# Patient Record
Sex: Female | Born: 1955
Health system: Southern US, Community
[De-identification: ages and names within clinical notes are randomized; demographics above are authoritative.]

## PROBLEM LIST (undated history)

## (undated) DIAGNOSIS — I509 Heart failure, unspecified: Secondary | ICD-10-CM

## (undated) DIAGNOSIS — I441 Atrioventricular block, second degree: Secondary | ICD-10-CM

## (undated) DIAGNOSIS — E785 Hyperlipidemia, unspecified: Secondary | ICD-10-CM

## (undated) DIAGNOSIS — T7840XA Allergy, unspecified, initial encounter: Secondary | ICD-10-CM

## (undated) DIAGNOSIS — I480 Paroxysmal atrial fibrillation: Secondary | ICD-10-CM

## (undated) DIAGNOSIS — C801 Malignant (primary) neoplasm, unspecified: Secondary | ICD-10-CM

## (undated) DIAGNOSIS — I10 Essential (primary) hypertension: Secondary | ICD-10-CM

## (undated) DIAGNOSIS — M1990 Unspecified osteoarthritis, unspecified site: Secondary | ICD-10-CM

## (undated) DIAGNOSIS — Z950 Presence of cardiac pacemaker: Secondary | ICD-10-CM

## (undated) DIAGNOSIS — Z9889 Other specified postprocedural states: Secondary | ICD-10-CM

## (undated) DIAGNOSIS — R112 Nausea with vomiting, unspecified: Secondary | ICD-10-CM

## (undated) DIAGNOSIS — E559 Vitamin D deficiency, unspecified: Secondary | ICD-10-CM

## (undated) HISTORY — DX: Vitamin D deficiency, unspecified: E55.9

## (undated) HISTORY — DX: Allergy, unspecified, initial encounter: T78.40XA

## (undated) HISTORY — DX: Presence of cardiac pacemaker: Z95.0

## (undated) HISTORY — DX: Atrioventricular block, second degree: I44.1

## (undated) HISTORY — DX: Essential (primary) hypertension: I10

## (undated) HISTORY — DX: Paroxysmal atrial fibrillation: I48.0

## (undated) HISTORY — DX: Malignant (primary) neoplasm, unspecified: C80.1

## (undated) HISTORY — DX: Unspecified osteoarthritis, unspecified site: M19.90

## (undated) HISTORY — DX: Hyperlipidemia, unspecified: E78.5

## (undated) HISTORY — PX: COLONOSCOPY: SHX174

## (undated) HISTORY — PX: PLANTAR FASCIECTOMY: SUR600

## (undated) HISTORY — PX: POLYPECTOMY: SHX149

## (undated) HISTORY — DX: Heart failure, unspecified: I50.9

---

## 1978-10-08 HISTORY — PX: BREAST CYST EXCISION: SHX579

## 1997-11-08 ENCOUNTER — Encounter: Payer: Self-pay | Admitting: Gastroenterology

## 1998-11-09 ENCOUNTER — Encounter: Admission: RE | Admit: 1998-11-09 | Discharge: 1998-11-09 | Payer: Self-pay | Admitting: *Deleted

## 1999-09-14 ENCOUNTER — Other Ambulatory Visit: Admission: RE | Admit: 1999-09-14 | Discharge: 1999-09-14 | Payer: Self-pay | Admitting: *Deleted

## 2000-10-24 ENCOUNTER — Other Ambulatory Visit: Admission: RE | Admit: 2000-10-24 | Discharge: 2000-10-24 | Payer: Self-pay | Admitting: Internal Medicine

## 2001-10-24 ENCOUNTER — Other Ambulatory Visit: Admission: RE | Admit: 2001-10-24 | Discharge: 2001-10-24 | Payer: Self-pay | Admitting: Internal Medicine

## 2003-03-24 DIAGNOSIS — K648 Other hemorrhoids: Secondary | ICD-10-CM | POA: Insufficient documentation

## 2003-10-05 ENCOUNTER — Emergency Department (HOSPITAL_COMMUNITY): Admission: EM | Admit: 2003-10-05 | Discharge: 2003-10-05 | Payer: Self-pay | Admitting: Emergency Medicine

## 2003-11-01 ENCOUNTER — Ambulatory Visit (HOSPITAL_COMMUNITY): Admission: RE | Admit: 2003-11-01 | Discharge: 2003-11-01 | Payer: Self-pay | Admitting: Internal Medicine

## 2003-11-01 ENCOUNTER — Other Ambulatory Visit: Admission: RE | Admit: 2003-11-01 | Discharge: 2003-11-01 | Payer: Self-pay | Admitting: Internal Medicine

## 2005-11-15 ENCOUNTER — Other Ambulatory Visit: Admission: RE | Admit: 2005-11-15 | Discharge: 2005-11-15 | Payer: Self-pay | Admitting: Internal Medicine

## 2007-04-23 ENCOUNTER — Other Ambulatory Visit: Admission: RE | Admit: 2007-04-23 | Discharge: 2007-04-23 | Payer: Self-pay | Admitting: Internal Medicine

## 2007-05-20 ENCOUNTER — Ambulatory Visit: Payer: Self-pay | Admitting: Gastroenterology

## 2007-06-27 ENCOUNTER — Ambulatory Visit: Payer: Self-pay | Admitting: Gastroenterology

## 2008-01-10 DIAGNOSIS — K5900 Constipation, unspecified: Secondary | ICD-10-CM | POA: Insufficient documentation

## 2008-01-10 DIAGNOSIS — Z8711 Personal history of peptic ulcer disease: Secondary | ICD-10-CM | POA: Insufficient documentation

## 2008-01-10 DIAGNOSIS — K219 Gastro-esophageal reflux disease without esophagitis: Secondary | ICD-10-CM | POA: Insufficient documentation

## 2008-02-14 ENCOUNTER — Observation Stay (HOSPITAL_COMMUNITY): Admission: EM | Admit: 2008-02-14 | Discharge: 2008-02-15 | Payer: Self-pay | Admitting: Emergency Medicine

## 2008-03-22 ENCOUNTER — Ambulatory Visit: Payer: Self-pay | Admitting: Internal Medicine

## 2008-05-28 ENCOUNTER — Ambulatory Visit: Payer: Self-pay | Admitting: Internal Medicine

## 2008-08-05 ENCOUNTER — Emergency Department (HOSPITAL_COMMUNITY): Admission: EM | Admit: 2008-08-05 | Discharge: 2008-08-05 | Payer: Self-pay | Admitting: Emergency Medicine

## 2008-12-24 ENCOUNTER — Other Ambulatory Visit: Admission: RE | Admit: 2008-12-24 | Discharge: 2008-12-24 | Payer: Self-pay | Admitting: Internal Medicine

## 2009-07-04 ENCOUNTER — Emergency Department (HOSPITAL_COMMUNITY): Admission: EM | Admit: 2009-07-04 | Discharge: 2009-07-04 | Payer: Self-pay | Admitting: Emergency Medicine

## 2009-10-08 HISTORY — PX: WRIST SURGERY: SHX841

## 2009-11-10 ENCOUNTER — Ambulatory Visit (HOSPITAL_COMMUNITY): Admission: RE | Admit: 2009-11-10 | Discharge: 2009-11-10 | Payer: Self-pay | Admitting: Internal Medicine

## 2009-12-05 ENCOUNTER — Other Ambulatory Visit: Admission: RE | Admit: 2009-12-05 | Discharge: 2009-12-05 | Payer: Self-pay | Admitting: Internal Medicine

## 2010-10-28 ENCOUNTER — Encounter: Payer: Self-pay | Admitting: Internal Medicine

## 2010-11-07 ENCOUNTER — Ambulatory Visit
Admission: RE | Admit: 2010-11-07 | Discharge: 2010-11-07 | Payer: Self-pay | Source: Home / Self Care | Attending: Orthopedic Surgery | Admitting: Orthopedic Surgery

## 2010-11-07 LAB — POCT HEMOGLOBIN-HEMACUE: Hemoglobin: 13.4 g/dL (ref 12.0–15.0)

## 2010-11-07 NOTE — Procedures (Signed)
Summary: Gastroenterology endo  Gastroenterology endo   Imported By: Donneta Romberg 01/10/2008 13:45:04  _____________________________________________________________________  External Attachment:    Type:   Image     Comment:   External Document

## 2010-11-07 NOTE — Procedures (Signed)
Summary: Gastroenterology flexsigmoidscopy  Gastroenterology flexsigmoidscopy   Imported By: Donneta Romberg 01/10/2008 13:46:08  _____________________________________________________________________  External Attachment:    Type:   Image     Comment:   External Document

## 2010-11-10 NOTE — Procedures (Signed)
Summary: Gastroenterology colon  Gastroenterology colon   Imported By: Donneta Romberg 01/10/2008 13:44:10  _____________________________________________________________________  External Attachment:    Type:   Image     Comment:   External Document

## 2010-11-27 NOTE — Op Note (Signed)
  NAME:  Alyssa Dudley, Alyssa Dudley NO.:  [id number]  MEDICAL RECORD NO.:  [id number]          PATIENT TYPE:  AMB  LOCATION:  DSC                          FACILITY:  [facility]  PHYSICIAN:  Cindee Salt, M.D.       DATE OF BIRTH:  01-28-1956  DATE OF PROCEDURE:  11/07/2010 DATE OF DISCHARGE:                              OPERATIVE REPORT   PREOPERATIVE DIAGNOSIS:  Mass, right wrist.  POSTOPERATIVE DIAGNOSIS:  Mass, right wrist.  OPERATION:  Excisional biopsy mass, right wrist.  SURGEON:  Cindee Salt, MD  ASSISTANTCarolyne Fiscal, RN  ANESTHESIA:  General with local infiltration.  ANESTHESIOLOGIST:  Sheldon Silvan, MD  HISTORY:  The patient is a 55 year old female with a history of a large mass on the ulnar aspect of her right wrist.  She is desirous of having this excised.  She is aware of risks and complications including infection, recurrence of injury to arteries, nerves, tendons, incomplete relief of symptoms, dystrophy.  In the preoperative area, the patient is seen, the extremity marked by both the patient and surgeon, antibiotic given.  PROCEDURE:  The patient was brought to the operating room where a general anesthetic was carried out without difficulty under direction of Dr. Ivin Booty.  She was prepped using ChloraPrep, supine position, right arm free.  A 3-minute dry time was allowed, time-out taken, confirming the patient and procedure. A longitudinal incision was made over the mass ulnar aspect of wrist after exsanguination of the limb with an Esmarch bandage and inflation of tourniquet to 250 mmHg.  Dissection was carried down through subcutaneous tissue with blunt dissection.  The dorsal sensory branch of the ulnar nerve was looked for but not identified in the wound.  A large well-lobulated mass was immediately encountered. This was approximately 5 x 2.5 x 3 cm in size.  This was excised in toto.  The wound copiously irrigated with saline.  Subcutaneous tissue was then  closed with interrupted 4-0 Vicryl sutures, the skin with a subcuticular 4-0 Vicryl Rapide sutures.  Local infiltration with 0.25% Marcaine without epinephrine was given, approximately 8 mL was used.  Sterile compressive dressing was applied.  On deflation of the tourniquet, all fingers immediately pinked.  She was taken to the recovery room for observation in satisfactory condition.  She will be discharged home to return to the [facility] in 1 week on Vicodin.          ______________________________ Cindee Salt, M.D.     GK/MEDQ  D:  11/07/2010  T:  11/07/2010  Job:  [id number]  cc:   Lovenia Kim, D.O.  Electronically Signed by Cindee Salt M.D. on 11/27/2010 02:27:32 PM

## 2011-02-20 NOTE — Discharge Summary (Signed)
NAME:  Alyssa Dudley, Alyssa Dudley NO.:  [id number]   MEDICAL RECORD NO.:  [id number]          PATIENT TYPE:  INP   LOCATION:  3714                         FACILITY:  [facility]   PHYSICIAN:  Michelene Gardener, MD    DATE OF BIRTH:  [birth year]   DATE OF ADMISSION:  02/14/2008  DATE OF DISCHARGE:  02/15/2008                               DISCHARGE SUMMARY   DISCHARGE DIAGNOSES:  1. Chest pain most likely secondary to gastroesophageal reflux      disease.  2. Gastroesophageal reflux disease.   DISCHARGE MEDICATION:  Pepcid 20 mg p.o. twice daily.   CONSULTATIONS:  None.   PROCEDURE:  None.   Followup appointment with primary doctor in 1-2 weeks.   RADIOLOGY STUDIES:  Chest x-ray on Feb 14, 2008, showed no active  disease.   COURSE FOR HOSPITALIZATION:  This is a 55 year old African-American  female with no past medical history presented with chest pain.  The pain  was atypical and mostly suggestive of GERD.  The patient had mild  shortness with her chest pain, so she was admitted for further  evaluation.  Three sets of troponin and cardiac enzymes were done and  they came to be normal.  Her ECG was normal.  Tele monitor showed no  evidence of acute arrhythmias.  The patient was advised to stay in the  hospital to get an echocardiogram to evaluate her left wall motion.  The  patient insisted of going home, and she stated that she would like to  follow with her primary doctor.  She was advised to see her doctor in  the next week for evaluation and for possible echo versus stress test.  She was also started on Pepcid for symptoms of GERD.   TOTAL ASSESSMENT TIME:  1 hour 40 minutes.      Michelene Gardener, MD  Electronically Signed     NAE/MEDQ  D:  02/15/2008  T:  02/15/2008  Job:  [id number]   cc:   Lovenia Kim, D.O.

## 2011-02-20 NOTE — H&P (Signed)
NAME:  Alyssa Dudley, Alyssa Dudley NO.:  [id number]   MEDICAL RECORD NO.:  [id number]          PATIENT TYPE:  INP   LOCATION:  1826                         FACILITY:  [facility]   PHYSICIAN:  Michaelyn Barter, M.D. DATE OF BIRTH:  [birth year]   DATE OF ADMISSION:  02/14/2008  DATE OF DISCHARGE:                              HISTORY & PHYSICAL   PRIMARY CARE PHYSICIAN:  Dr. [name].   CHIEF COMPLAINT:  Chest pressure.   HISTORY OF PRESENT ILLNESS:  Alyssa Dudley is a 55 year old female who  states that approximately 2 weeks ago she started having some fluttering  of her heart. Yesterday at approximately 4 o'clock she developed some  centrally located chest pressure. She stated that the sensation felt  like indigestion. As a result she took TUMS, Nexium; however, she did  not have any relief of her symptoms.  She describes the sensation as a  heaviness in the center of her chest. She also indicated that eating and  drinking caused a burning type of sensation within her chest. The  heaviness continued throughout the course of yesterday on into today.  She does state, however, that she was able to sleep last night. However,  she cannot recall whether or not she had the sensation of heaviness  throughout the latter portion of the night. She does state that around  12 or 1 o'clock today the pressure type of sensation returned, and at  approximately 3:30 she developed numbness in her lower lip as well as a  tingling type of sensation within her left hand. With regards to the  fluttering that started approximately 2 weeks ago.  It occurs two to  three times per day, and it typically lasts a few seconds.  Occasionally  lightheadedness is accompanied by this sensation.  She states that it  feels similar to a hot flash. Lying down provides some slight relief  from the chest discomfort.   PAST MEDICAL HISTORY:  The patient has no medical issues.   PAST SURGICAL HISTORY:  C-section.   ALLERGIES:  NO KNOWN DRUG ALLERGIES.   CURRENT HOME MEDICATIONS:  None.   SOCIAL HISTORY:  The patient smoked one to two cigarettes per day from  the age of 59 to 19th but has not smoked any cigarettes since. Alcohol  the patient denies.   FAMILY HISTORY:  Mother has a history of hypertension, diabetes mellitus  and was treated for cervical cancer in the past.  Father died secondary  to an MI.  The age of his death is questionable.   REVIEW OF SYSTEMS:  Review of systems as per HPI.   PHYSICAL EXAMINATION:  GENERAL:  The patient is awake.  She is  cooperative.  She is in no obvious respiratory distress.  VITAL SIGNS:  Temperature is 97.1, blood pressure 139/87, heart rate 64,  respirations 18, O2 sat 97% on room air.  HEENT: Normocephalic, atraumatic, anicteric. Extraocular movements are  intact.  Pupils are slightly sluggish to react to light.  Oral mucosa is  pink.  No thrush, no exudates.  No JVD, no lymphadenopathy, no  thyromegaly.  Carotid  upstrokes are strong bilaterally.  No bruits were  auscultated.  CARDIAC:  S1, S2 present.  Heart sounds are somewhat distant.  RESPIRATORY:  No crackles or wheezes.  ABDOMEN:  Flat, soft, nontender, nondistended.  No masses palpated.  No  hepatosplenomegaly appreciated.  EXTREMITIES:  No leg edema.  NEUROLOGIC:  The patient is alert and oriented x3.  Cranial nerves 2-12  are intact.  Gag reflex was not tested.  MUSCULOSKELETAL:  5/5 upper and  lower extremity strength.   LABS:  Cardiac markers, CK-MB, PLCless than 1.0.  Troponin I, PLC less  than 0.05.  Sodium 142, potassium 3.6, chloride 107, glucose 82, BUN 10,  creatinine 0.9. White blood cell count is 5.5, hemoglobin 12.9,  hematocrit 37.6, platelet count 224.   An EKG was completed. It reveals normal sinus rhythm.  No Q-waves, no  obvious ST-segment abnormalities.  Chest x-ray was completed which  revealed no active disease.  Heart size is normal.  The lungs are clear.    ASSESSMENT AND PLAN:  1. Chest pain.  The etiology is cardiac versus noncardiac. The patient      describes both atypical and typical symptoms for her current chest      pain. We will admit the patient to telemetry.  Will cycle both her      troponin I and CK-MB x3 q. 8 hours apart.  Will provide the patient      with p.r.n. morphine, oxygen, nitroglycerin and aspirin. Will check      a 2-D echocardiogram as well as a fasting lipid profile.  2. GI prophylaxis; will provide Protonix.  3. DVT prophylaxis, Lovenox.      Michaelyn Barter, M.D.  Electronically Signed     OR/MEDQ  D:  02/14/2008  T:  02/14/2008  Job:  [id number]   cc:   Lovenia Kim, D.O.

## 2011-02-20 NOTE — Assessment & Plan Note (Signed)
[facility]                            CARDIOLOGY OFFICE NOTE   MICHELINE, MARKES                    MRN:          [id number]  DATE:03/22/2008                            DOB:          [birth year]    IDENTIFICATION:  Alyssa Dudley was referred for evaluation of chest pain.   The patient says she was seen in the emergency room at [facility] on May  9. The day before she was at work when she developed chest pressure. She  had been under increased stress. The pressure lasted through Saturday  and was continuous.  Food and drink actually made it feel like she had  razor blades in her throat.  They made it worse. She had some shortness  of breath.  She went to the emergency room and given two nitroglycerin  and with each nitroglycerin the pain went away.  She has not had any  chest pain like this since.   She was admitted, ruled out and set up here for continued evaluation.  Denies shortness of breath.  She has cut back on her activity some as  she was told to.  She is trying to cut back  on her stress.   CURRENT MEDICATIONS:  None.   ALLERGIES:  None.   PAST MEDICAL HISTORY:  Negative.   PAST SURGICAL HISTORY:  C-section times one.   SOCIAL HISTORY:  Quit tobacco at age 58, had about one to two cigarettes  per day for 2 years; does not drink.   FAMILY HISTORY:  Mother with hypertension, diabetes, cervical cancer.  Father died of an myocardial infarction,  question age.   REVIEW OF SYSTEMS:  All systems reviewed, negative to the above problem,  except as noted above.   PHYSICAL EXAM:  The patient is in no distress at rest.  Blood pressure  is 132/81, pulse is 66, weight 174.  HEENT:  Normocephalic, atraumatic, EOMI, PERL.  Mucous membranes are  moist.  NECK:  JVP is normal.  No thyromegaly or bruits.  LUNGS:  Lungs are clear to auscultation without rales or wheezes.  CARDIAC:  Regular rate and rhythm, S1-S2, no S3-S4 murmurs.  ABDOMEN:  Abdomen is benign.  Normal bowel sounds.  No masses.  EXTREMITIES:  Good distal pulses.  No edema.   A 12-lead EKG normal sinus rhythm, 68 beats per minute.  Incomplete  right bundle branch block, borderline for LVH.   IMPRESSION:  Alyssa Dudley is a 55 year old woman with chest pain.  The  pain is very atypical and it really sounds like GI, especially with the  food and drink causing a razor blade like sensation.  Again,  nitroglycerin could relieve this.  I am not convinced it is cardiac.   I did not mention this in the HPI, but she did say before she went to  the emergency room, she had several weeks to months of her heart  fluttering sometimes very hard, occasional dizziness, but no chest pain.  No syncope.  Again, not clear if this was associated, but she did have  these symptoms.   What I would  recommend for now is I would get an echocardiogram to  evaluate her LV function.  I would not place her on any ongoing  medicine.  I would give her a prescription for nitroglycerin to use as  needed.  If her symptoms worsen or change, I would be happy to schedule  a stress test, but I would not do this for now.   On looking at her labs, from the hospital, she had a cholesterol panel.  Her  LDL was 112, HDL was 40.  I would like to get a lipomomet panel.  Again, I will be in touch with her once I have seen this.     Pricilla Riffle, MD, [facility]  Electronically Signed    PVR/MedQ  DD: 03/22/2008  DT: 03/22/2008  Job #: [id number]   cc:   Lovenia Kim, D.O.

## 2011-02-20 NOTE — Assessment & Plan Note (Signed)
[facility]                         GASTROENTEROLOGY OFFICE NOTE   Alyssa Dudley, Alyssa Dudley                    MRN:          [id number]  DATE:05/20/2007                            DOB:          07-May-1956    Mrs. Alyssa Dudley is a 55 year old black female who has chronic GERD with  p.r.n. Nexium use.  She had a negative endoscopic exam except for a 3-4  cm hiatal hernia in 1999.  She also has had previous colonoscopy which  was negative, except for some internal hemorrhoids in June of 2004.  She  has a 55 year old sister who was recently diagnosed with stage III colon  cancer.   Alyssa Dudley has chronic, functional constipation with straining and  incomplete evacuation.  She really denies rectal bleeding, gas, or  bloating, anorexia or weight loss.  She denies any significant upper GI  or hepatobiliary complaints.   PAST MEDICAL HISTORY:  Otherwise noncontributory except for a removal of  a breast cyst several years ago.  She has no significant medical  history, is followed by Dr. Elisabeth Dudley.  She currently is on no  medications and denies any allergies.  She does use p.r.n. Nexium and  p.r.n. Ambien.   FAMILY HISTORY:  Besides her sister with colon cancer, remarkable for  mother who has had ovarian cancer.   SOCIAL HISTORY:  She is divorced and lives by herself.  She does have a  college education.  She does not smoke or use ethanol.   REVIEW OF SYSTEMS:  Entirely, otherwise, noncontributory.   PHYSICAL EXAMINATION:  She is a healthy, pleasant, black female  appearing her stated age.  She is 5 feet 6 inches and weighs 168 pounds.  Blood pressure 110/76 and  pulse was 56 and regular.  I could not appreciate stigmata of chronic liver disease.  Her chest was clear and she was in a regular rhythm without murmurs,  gallops, or rubs.  There was no hepatosplenomegaly, abdominal masses, or tenderness.  Peripheral extremities were unremarkable.  Rectal exam was  deferred.  Reflexes were normal.   ASSESSMENT:  1. Chronic, functional constipation.  2. Strong family history of colon carcinoma in her sister.  3. Evidence of ovarian carcinoma in her mother.  4. Acid reflux, managed with on-demand Nexium therapy.   RECOMMENDATIONS:  1. Trial of Amitiza 8 mcg twice a day.  2. High-fiber diet with fiber supplements and liberal p.o. fluids.  3. Outpatient colonoscopy at her convenience.  4. Continue medical followup with Dr. Elisabeth Dudley as previously planned.     Alyssa Rea. Jarold Motto, MD, [facility], FAGA  Electronically Signed    DRP/MedQ  DD: 05/20/2007  DT: 05/21/2007  Job #: [id number]   cc:   Lovenia Kim, D.O.

## 2011-02-20 NOTE — Assessment & Plan Note (Signed)
Alyssa Dudley                            CARDIOLOGY OFFICE NOTE   Alyssa Dudley, Alyssa Dudley                    MRN:          [id number]  DATE:05/28/2008                            DOB:          09-Mar-1956    IDENTIFICATION:  Alyssa Dudley is a 55 year old woman who was referred  back in June for evaluation of chest pain.  Refer to this for full  details.  When I saw her, it was felt that the pain was atypical for  cardiac.  I did recommend an echocardiogram.  She did not show up for  this.   In the interval, since seen she has done well.  She has not had any more  chest pain.  She remains very active.   CURRENT MEDICATIONS:  None other than vitamins and calcium supplements.   PHYSICAL EXAMINATION:  GENERAL:  The patient is in no distress.  VITAL SIGNS:  Blood pressure is 110/73, pulse 69, weight 175.  LUNGS:  Clear.  CARDIAC:  Regular rate and rhythm.  S1 and S2.  No S3, no murmurs.  ABDOMEN:  Benign.  EXTREMITIES:  No edema.   IMPRESSION:  Chest pain, atypical again appeared to be more  gastrointestinal.  She felt sensed razorblades when she swallowed  things.  She has had no recurrence.  She is doing well, she is active.  I do not see any evidence for any active cardiac problems.   I have talked to the patient today about health care maintenance.  Her  lipids are okay.  She could continue to work to improve them.  I  encouraged her to stay as active as she can, watch her diet.   We will follow up as needed, if the patient has any new symptoms  develop.     Pricilla Riffle, MD, [facility]  Electronically Signed    PVR/MedQ  DD: 05/28/2008  DT: 05/28/2008  Job #: [id number]   cc:   Lovenia Kim, D.O.

## 2011-04-04 ENCOUNTER — Emergency Department (HOSPITAL_COMMUNITY)
Admission: EM | Admit: 2011-04-04 | Discharge: 2011-04-05 | Disposition: A | Discharge status: Home / Self Care | Payer: BC Managed Care – PPO | Attending: Emergency Medicine | Admitting: Emergency Medicine

## 2011-04-04 ENCOUNTER — Emergency Department (HOSPITAL_COMMUNITY): Payer: BC Managed Care – PPO

## 2011-04-04 DIAGNOSIS — K219 Gastro-esophageal reflux disease without esophagitis: Secondary | ICD-10-CM | POA: Insufficient documentation

## 2011-04-04 DIAGNOSIS — R109 Unspecified abdominal pain: Secondary | ICD-10-CM | POA: Insufficient documentation

## 2011-04-04 DIAGNOSIS — N200 Calculus of kidney: Secondary | ICD-10-CM | POA: Insufficient documentation

## 2011-04-04 LAB — DIFFERENTIAL
Basophils Absolute: 0 10*3/uL (ref 0.0–0.1)
Basophils Relative: 1 % (ref 0–1)
Eosinophils Absolute: 0.1 10*3/uL (ref 0.0–0.7)
Eosinophils Relative: 2 % (ref 0–5)
Lymphocytes Relative: 54 % — ABNORMAL HIGH (ref 12–46)
Lymphs Abs: 3.2 10*3/uL (ref 0.7–4.0)
Monocytes Absolute: 0.6 10*3/uL (ref 0.1–1.0)
Monocytes Relative: 10 % (ref 3–12)
Neutro Abs: 1.9 10*3/uL (ref 1.7–7.7)
Neutrophils Relative %: 33 % — ABNORMAL LOW (ref 43–77)

## 2011-04-04 LAB — COMPREHENSIVE METABOLIC PANEL
ALT: 17 U/L (ref 0–35)
AST: 18 U/L (ref 0–37)
Albumin: 3.7 g/dL (ref 3.5–5.2)
Alkaline Phosphatase: 122 U/L — ABNORMAL HIGH (ref 39–117)
BUN: 15 mg/dL (ref 6–23)
CO2: 28 mEq/L (ref 19–32)
Calcium: 9.7 mg/dL (ref 8.4–10.5)
Chloride: 104 mEq/L (ref 96–112)
Creatinine, Ser: 0.65 mg/dL (ref 0.50–1.10)
GFR calc Af Amer: >60 mL/min (ref >60)
GFR calc non Af Amer: >60 mL/min (ref >60)
Glucose, Bld: 109 mg/dL — ABNORMAL HIGH (ref 70–99)
Potassium: 4 mEq/L (ref 3.5–5.1)
Sodium: 141 mEq/L (ref 135–145)
Total Bilirubin: 0.1 mg/dL — ABNORMAL LOW (ref 0.3–1.2)
Total Protein: 7.5 g/dL (ref 6.0–8.3)

## 2011-04-04 LAB — CBC
HCT: 37.7 % (ref 36.0–46.0)
Hemoglobin: 13.1 g/dL (ref 12.0–15.0)
MCH: 30.5 pg (ref 26.0–34.0)
MCHC: 34.7 g/dL (ref 30.0–36.0)
MCV: 87.9 fL (ref 78.0–100.0)
Platelets: 221 10*3/uL (ref 150–400)
RBC: 4.29 MIL/uL (ref 3.87–5.11)
RDW: 13 % (ref 11.5–15.5)
WBC: 5.8 10*3/uL (ref 4.0–10.5)

## 2011-04-05 ENCOUNTER — Other Ambulatory Visit: Payer: Self-pay | Admitting: Internal Medicine

## 2011-04-05 LAB — URINALYSIS, ROUTINE W REFLEX MICROSCOPIC
Bilirubin Urine: NEGATIVE
Glucose, UA: NEGATIVE mg/dL
Hgb urine dipstick: NEGATIVE
Ketones, ur: NEGATIVE mg/dL
Nitrite: NEGATIVE
Protein, ur: NEGATIVE mg/dL
Specific Gravity, Urine: 1.02 (ref 1.005–1.030)
Urobilinogen, UA: 1 mg/dL (ref 0.0–1.0)
pH: 7.5 (ref 5.0–8.0)

## 2011-04-05 LAB — URINE MICROSCOPIC-ADD ON

## 2011-07-09 LAB — URINALYSIS, ROUTINE W REFLEX MICROSCOPIC
Bilirubin Urine: NEGATIVE
Glucose, UA: NEGATIVE
Ketones, ur: NEGATIVE
Nitrite: NEGATIVE
Protein, ur: 30 — AB
Specific Gravity, Urine: 1.026
Urobilinogen, UA: 1
pH: 5.5

## 2011-07-09 LAB — DIFFERENTIAL
Basophils Absolute: 0
Basophils Relative: 0
Eosinophils Absolute: 0.1
Eosinophils Relative: 1
Lymphocytes Relative: 34
Lymphs Abs: 1.8
Monocytes Absolute: 0.5
Monocytes Relative: 10
Neutro Abs: 2.9
Neutrophils Relative %: 54

## 2011-07-09 LAB — CBC
HCT: 40.9
Hemoglobin: 13.8
MCHC: 33.8
MCV: 92.1
Platelets: 242
RBC: 4.44
RDW: 13.1
WBC: 5.4

## 2011-07-09 LAB — BASIC METABOLIC PANEL
BUN: 8
CO2: 28
Calcium: 9.4
Chloride: 108
Creatinine, Ser: 0.72
GFR calc Af Amer: >60
GFR calc non Af Amer: >60
Glucose, Bld: 99
Potassium: 3.8
Sodium: 141

## 2011-07-09 LAB — URINE MICROSCOPIC-ADD ON

## 2011-09-13 ENCOUNTER — Encounter: Payer: Self-pay | Admitting: Gastroenterology

## 2011-09-28 ENCOUNTER — Encounter: Payer: Self-pay | Admitting: Gastroenterology

## 2011-09-28 ENCOUNTER — Ambulatory Visit (AMBULATORY_SURGERY_CENTER): Payer: BC Managed Care – PPO | Admitting: *Deleted

## 2011-09-28 VITALS — Ht 66.0 in | Wt 178.0 lb

## 2011-09-28 DIAGNOSIS — Z1211 Encounter for screening for malignant neoplasm of colon: Secondary | ICD-10-CM

## 2011-09-28 MED ORDER — PEG-KCL-NACL-NASULF-NA ASC-C 100 G PO SOLR: ORAL | Status: DC

## 2011-10-15 ENCOUNTER — Ambulatory Visit (AMBULATORY_SURGERY_CENTER): Payer: BC Managed Care – PPO | Admitting: Gastroenterology

## 2011-10-15 ENCOUNTER — Encounter: Payer: Self-pay | Admitting: Gastroenterology

## 2011-10-15 VITALS — BP 159/87 | HR 90 | Temp 96.6°F | Resp 19

## 2011-10-15 DIAGNOSIS — Z800 Family history of malignant neoplasm of digestive organs: Secondary | ICD-10-CM

## 2011-10-15 DIAGNOSIS — D126 Benign neoplasm of colon, unspecified: Secondary | ICD-10-CM

## 2011-10-15 DIAGNOSIS — Z1211 Encounter for screening for malignant neoplasm of colon: Secondary | ICD-10-CM

## 2011-10-15 DIAGNOSIS — K635 Polyp of colon: Secondary | ICD-10-CM | POA: Insufficient documentation

## 2011-10-15 MED ORDER — SODIUM CHLORIDE 0.9 % IV SOLN: 500.0000 mL | INTRAVENOUS | Status: DC

## 2011-10-15 NOTE — Progress Notes (Signed)
Patient did not experience any of the following events: a burn prior to discharge; a fall within the facility; wrong site/side/patient/procedure/implant event; or a hospital transfer or hospital admission upon discharge from the facility. (G8907) Patient did not have preoperative order for IV antibiotic SSI prophylaxis. (G8918)  

## 2011-10-15 NOTE — Op Note (Signed)
[facility] [address] [place], [place]  [address]  COLONOSCOPY PROCEDURE REPORT  PATIENT:  Alyssa, Dudley  MR#:  [id number] BIRTHDATE:  03-18-1956, 55 yrs. old  GENDER:  female ENDOSCOPIST:  Vania Rea. Jarold Motto, MD, [facility] REF. BY: PROCEDURE DATE:  10/15/2011 PROCEDURE:  Colonoscopy with snare polypectomy ASA CLASS:  Class I INDICATIONS:  family history of colon cancer SISTER MEDICATIONS:   propofol (Diprivan) 350 mg IV  DESCRIPTION OF PROCEDURE:   After the risks and benefits and of the procedure were explained, informed consent was obtained. Digital rectal exam was performed and revealed no abnormalities. The LB 180AL [id number] endoscope was introduced through the anus and advanced to the cecum, which was identified by both the appendix and ileocecal valve.  The quality of the prep was excellent, using MoviPrep.  The instrument was then slowly withdrawn as the colon was fully examined. <<PROCEDUREIMAGES>>  FINDINGS:  A sessile polyp was found. 4 MM SIGMOID POLYP COLD SNARE  This was otherwise a normal examination of the colon. Retroflexed views in the rectum revealed no abnormalities.    The scope was then withdrawn from the patient and the procedure completed.  COMPLICATIONS:  None ENDOSCOPIC IMPRESSION: 1) Sessile polyp 2) Otherwise normal examination R/O ADENOMA. RECOMMENDATIONS: 1) Given your significant family history of colon cancer, you should have a repeat colonoscopy in 5 years  REPEAT EXAM:  No  ______________________________ Vania Rea. Jarold Motto, MD, [name]  CC:  Alyssa Brooklyn, DO  n. eSIGNED:   Vania Rea. Mical Brun at 10/15/2011 11:18 AM  Toney Rakes, [phone]

## 2011-10-15 NOTE — Patient Instructions (Signed)
Please follow discharge instructions given today. Colon polyp removed and sent to lab, you will receive result letter in your mail in 1-2 weeks. Repeat colonoscopy in 5 years. Resume current medications today. Call us with any questions or concerns. Thank you!!

## 2011-10-16 ENCOUNTER — Telehealth: Payer: Self-pay

## 2011-10-16 NOTE — Telephone Encounter (Signed)

## 2011-10-19 ENCOUNTER — Encounter: Payer: Self-pay | Admitting: Gastroenterology

## 2011-12-12 ENCOUNTER — Other Ambulatory Visit (HOSPITAL_COMMUNITY): Payer: Self-pay | Admitting: Internal Medicine

## 2011-12-12 ENCOUNTER — Ambulatory Visit (HOSPITAL_COMMUNITY)
Admission: RE | Admit: 2011-12-12 | Discharge: 2011-12-12 | Disposition: A | Discharge status: Home / Self Care | Payer: BC Managed Care – PPO | Source: Ambulatory Visit | Attending: Internal Medicine | Admitting: Internal Medicine

## 2011-12-12 DIAGNOSIS — M549 Dorsalgia, unspecified: Secondary | ICD-10-CM

## 2011-12-12 DIAGNOSIS — N200 Calculus of kidney: Secondary | ICD-10-CM | POA: Insufficient documentation

## 2011-12-12 DIAGNOSIS — M25519 Pain in unspecified shoulder: Secondary | ICD-10-CM | POA: Insufficient documentation

## 2011-12-12 DIAGNOSIS — M79609 Pain in unspecified limb: Secondary | ICD-10-CM | POA: Insufficient documentation

## 2011-12-12 DIAGNOSIS — M2550 Pain in unspecified joint: Secondary | ICD-10-CM

## 2012-02-12 ENCOUNTER — Other Ambulatory Visit: Payer: Self-pay | Admitting: Emergency Medicine

## 2012-02-12 ENCOUNTER — Other Ambulatory Visit (HOSPITAL_COMMUNITY)
Admission: RE | Admit: 2012-02-12 | Discharge: 2012-02-12 | Disposition: A | Discharge status: Home / Self Care | Payer: BC Managed Care – PPO | Source: Ambulatory Visit | Attending: Internal Medicine | Admitting: Internal Medicine

## 2012-02-12 DIAGNOSIS — Z01419 Encounter for gynecological examination (general) (routine) without abnormal findings: Secondary | ICD-10-CM | POA: Insufficient documentation

## 2012-12-25 ENCOUNTER — Other Ambulatory Visit (HOSPITAL_COMMUNITY): Payer: Self-pay | Admitting: Internal Medicine

## 2012-12-25 ENCOUNTER — Ambulatory Visit (HOSPITAL_COMMUNITY)
Admission: RE | Admit: 2012-12-25 | Discharge: 2012-12-25 | Disposition: A | Discharge status: Home / Self Care | Payer: BC Managed Care – PPO | Source: Ambulatory Visit | Attending: Internal Medicine | Admitting: Internal Medicine

## 2012-12-25 DIAGNOSIS — R079 Chest pain, unspecified: Secondary | ICD-10-CM

## 2012-12-25 DIAGNOSIS — R0602 Shortness of breath: Secondary | ICD-10-CM | POA: Insufficient documentation

## 2013-01-05 ENCOUNTER — Encounter: Payer: Self-pay | Admitting: Internal Medicine

## 2013-01-05 ENCOUNTER — Ambulatory Visit (INDEPENDENT_AMBULATORY_CARE_PROVIDER_SITE_OTHER): Payer: BC Managed Care – PPO | Admitting: Internal Medicine

## 2013-01-05 VITALS — BP 120/76 | HR 68 | Ht 62.0 in | Wt 181.8 lb

## 2013-01-05 DIAGNOSIS — R0602 Shortness of breath: Secondary | ICD-10-CM

## 2013-01-05 DIAGNOSIS — R002 Palpitations: Secondary | ICD-10-CM

## 2013-01-05 LAB — D-DIMER, QUANTITATIVE: D-Dimer, Quant: <0.27 ug/mL-FEU (ref 0.00–0.48)

## 2013-01-05 NOTE — Progress Notes (Signed)
HPI Patient seen at Dr Michaelle Birks offic Last 3 wks SOB with walking, feels heart race.  Chest tight  3 months ago OK No PND.  No Edema  No CP   Father died of possible MI in 36s  Not smoker. No Known Allergies  Current Outpatient Prescriptions  Medication Sig Dispense Refill  . cholecalciferol (VITAMIN D) 1000 UNITS tablet Take 2,000 Units by mouth daily.      . Ibuprofen-Diphenhydramine HCl (ADVIL PM) 200-25 MG CAPS Take 1 tablet by mouth at bedtime as needed.        . pravastatin (PRAVACHOL) 40 MG tablet Take 40 mg by mouth daily.        No current facility-administered medications for this visit.    PMH  Hyperlipidemia  Past Surgical History  Procedure Laterality Date  . Breast cyst excision  1980  . Wrist surgery  2011    removed cyst    Family History  Problem Relation Age of Onset  . Colon cancer Sister     History   Social History  . Marital Status: Single    Spouse Name: N/A    Number of Children: N/A  . Years of Education: N/A   Occupational History  . Not on file.   Social History Main Topics  . Smoking status: Never Smoker   . Smokeless tobacco: Never Used  . Alcohol Use: No  . Drug Use: No  . Sexually Active: Not on file   Other Topics Concern  . Not on file   Social History Narrative  . No narrative on file    Review of Systems:  All systems reviewed.  They are negative to the above problem except as previously stated.  Vital Signs: BP 120/76  Pulse 68  Ht 5\' 2"  (1.575 m)  Wt 181 lb 12.8 oz (82.464 kg)  BMI 33.24 kg/m2  Physical Exam Patient is in NAD  HEENT:  Normocephalic, atraumatic. EOMI, PERRLA.  Neck: JVP is normal.  No bruits.  Lungs: clear to auscultation. No rales no wheezes.  Heart: Regular rate and rhythm. Normal S1, S2. No S3.   No significant murmurs. PMI not displaced.  Abdomen:  Supple, nontender. Normal bowel sounds. No masses. No hepatomegaly.  Extremities:   Good distal pulses throughout. No lower extremity edema.   Musculoskeletal :moving all extremities.  Neuro:   alert and oriented x3.  CN II-XII grossly intact.  EKG  3/19  SR 63  IRBBB Assessment and Plan: Impression:  Patinet is a 57 yo with new dyspnea on exertion, chest tightness Exam is unremarkable I would recomm checking d dimer (patient travels)  I would also recomm getting an echo to evalaute systolic and diastolic function as well as est PA pressures.  In addition I would recomm a stress echo to evalaute for possible ischemia.  Take ASA 81 mg for now.  Take activities as tolerated.  2.  Dyslipidemia:  LDL was 123  HDL 44.  Just started on pravastatin  Counselled on wt loss.

## 2013-01-05 NOTE — Patient Instructions (Signed)
LABS TODAY:  D-Dimer  Your physician has requested that you have an echocardiogram. Echocardiography is a painless test that uses sound waves to create images of your heart. It provides your doctor with information about the size and shape of your heart and how well your heart's chambers and valves are working. This procedure takes approximately one hour. There are no restrictions for this procedure.  Your physician has requested that you have a stress echocardiogram. For further information please visit [email or web address] Please follow instruction sheet as given.

## 2013-01-07 ENCOUNTER — Ambulatory Visit (INDEPENDENT_AMBULATORY_CARE_PROVIDER_SITE_OTHER): Payer: BC Managed Care – PPO | Admitting: Internal Medicine

## 2013-01-07 ENCOUNTER — Ambulatory Visit (HOSPITAL_COMMUNITY): Payer: BC Managed Care – PPO | Attending: Cardiovascular Disease | Admitting: Radiology

## 2013-01-07 ENCOUNTER — Ambulatory Visit (HOSPITAL_COMMUNITY): Payer: BC Managed Care – PPO

## 2013-01-07 DIAGNOSIS — I441 Atrioventricular block, second degree: Secondary | ICD-10-CM

## 2013-01-07 DIAGNOSIS — R002 Palpitations: Secondary | ICD-10-CM

## 2013-01-07 DIAGNOSIS — R0602 Shortness of breath: Secondary | ICD-10-CM

## 2013-01-07 DIAGNOSIS — I459 Conduction disorder, unspecified: Secondary | ICD-10-CM

## 2013-01-07 LAB — CBC WITH DIFFERENTIAL/PLATELET
Basophils Absolute: 0 10*3/uL (ref 0.0–0.1)
Basophils Relative: 0 % (ref 0–1)
Eosinophils Absolute: 0.1 10*3/uL (ref 0.0–0.7)
Eosinophils Relative: 2 % (ref 0–5)
HCT: 40.8 % (ref 36.0–46.0)
Hemoglobin: 14.4 g/dL (ref 12.0–15.0)
Lymphocytes Relative: 46 % (ref 12–46)
Lymphs Abs: 2.7 10*3/uL (ref 0.7–4.0)
MCH: 29.8 pg (ref 26.0–34.0)
MCHC: 35.3 g/dL (ref 30.0–36.0)
MCV: 84.5 fL (ref 78.0–100.0)
Monocytes Absolute: 0.6 10*3/uL (ref 0.1–1.0)
Monocytes Relative: 11 % (ref 3–12)
Neutro Abs: 2.4 10*3/uL (ref 1.7–7.7)
Neutrophils Relative %: 41 % — ABNORMAL LOW (ref 43–77)
Platelets: 256 10*3/uL (ref 150–400)
RBC: 4.83 MIL/uL (ref 3.87–5.11)
RDW: 14.1 % (ref 11.5–15.5)
WBC: 5.8 10*3/uL (ref 4.0–10.5)

## 2013-01-07 LAB — TSH: TSH: 3.011 u[IU]/mL (ref 0.350–4.500)

## 2013-01-07 LAB — BASIC METABOLIC PANEL
BUN: 14 mg/dL (ref 6–23)
CO2: 26 mEq/L (ref 19–32)
Calcium: 10.1 mg/dL (ref 8.4–10.5)
Chloride: 104 mEq/L (ref 96–112)
Creat: 0.79 mg/dL (ref 0.50–1.10)
Glucose, Bld: 106 mg/dL — ABNORMAL HIGH (ref 70–99)
Potassium: 4.9 mEq/L (ref 3.5–5.3)
Sodium: 141 mEq/L (ref 135–145)

## 2013-01-07 LAB — ANGIOTENSIN CONVERTING ENZYME: Angiotensin-Converting Enzyme: 29 U/L (ref 8–52)

## 2013-01-07 NOTE — Progress Notes (Signed)
ELECTROPHYSIOLOGY CONSULT NOTE  Patient ID: Alyssa Dudley, MRN: [id number], DOB/AGE: [birth year] 57 y.o. Admit date: (Not on file) Date of Consult: 01/07/2013  Primary Physician: [name], DO Primary Cardiologist: PR   Chief Complaint: heart block   HPI Alyssa Dudley is a 57 y.o. female  Is seen as an add on because of abnormal GXT   She has symptoms Exertional lightheadedness. There has been some chest discomfort. She has no constitutional symptoms.  During her treadmill portion of her Myoview, she was noted to have heart block. We repeated the treadmill and noted that she went from 3:2 block unassociated with PR prolongation with faster heart rates to 2:1 block and then was still faster heart rate 3:1 block.   No past medical history on file.    Surgical History:  Past Surgical History  Procedure Laterality Date  . Breast cyst excision  1980  . Wrist surgery  2011    removed cyst     Home Meds: Prior to Admission medications   Medication Sig Start Date End Date Taking? Authorizing Provider  cholecalciferol (VITAMIN D) 1000 UNITS tablet Take 2,000 Units by mouth daily.    Historical Provider, MD  Ibuprofen-Diphenhydramine HCl (ADVIL PM) 200-25 MG CAPS Take 1 tablet by mouth at bedtime as needed.      Historical Provider, MD  pravastatin (PRAVACHOL) 40 MG tablet Take 40 mg by mouth daily.  12/26/12   Historical Provider, MD       Allergies: No Known Allergies  History   Social History  . Marital Status: Single    Spouse Name: N/A    Number of Children: N/A  . Years of Education: N/A   Occupational History  . Not on file.   Social History Main Topics  . Smoking status: Never Smoker   . Smokeless tobacco: Never Used  . Alcohol Use: No  . Drug Use: No  . Sexually Active: Not on file   Other Topics Concern  . Not on file   Social History Narrative  . No narrative on file     Family History  Problem Relation Age of Onset  . Colon cancer Sister      ROS:  Please see the history of present illness.     All other systems reviewed and negative.    Physical Exam:   Vital signs are recorded as part of the treadmill report  General: Well developed, well nourished female in no acute distress. Head: Normocephalic, atraumatic, sclera non-icteric, no xanthomas, nares are without discharge. EENT: normal Lymph Nodes:  none Back: without scoliosis/kyphosis , no CVA tendersness Neck: Negative for carotid bruits. JVD not elevated. Lungs: Clear bilaterally to auscultation without wheezes, rales, or rhonchi. Breathing is unlabored. Heart: slow but RRR with S1 S2. 2/6 murmur , rubs, or gallops appreciated. Abdomen: Soft, non-tender, non-distended with normoactive bowel sounds. No hepatomegaly. No rebound/guarding. No obvious abdominal masses. Msk:  Strength and tone appear normal for age. Extremities: No clubbing or cyanosis. No  edema.  Distal pedal pulses are 2+ and equal bilaterally. Skin: Warm and Dry Neuro: Alert and oriented X 3. CN III-XII intact Grossly normal sensory and motor function . Psych:  Responds to questions appropriately with a normal affect.      Labs: Cardiac Enzymes No results found for this basename: CKTOTAL, CKMB, TROPONINI,  in the last 72 hours CBC Lab Results  Component Value Date   WBC 5.8 04/04/2011   HGB 13.1 04/04/2011   HCT 37.7 04/04/2011  MCV 87.9 04/04/2011   PLT 221 04/04/2011   PROTIME: No results found for this basename: LABPROT, INR,  in the last 72 hours Chemistry No results found for this basename: NA, K, CL, CO2, BUN, CREATININE, CALCIUM, LABALBU, PROT, BILITOT, ALKPHOS, ALT, AST, GLUCOSE,  in the last 168 hours Lipids No results found for this basename: CHOL, HDL, LDLCALC, TRIG   BNP No results found for this basename: probnp   Miscellaneous Lab Results  Component Value Date   DDIMER <0.27 01/05/2013    Radiology/Studies:  Dg Chest 2 View  12/25/2012  *RADIOLOGY REPORT*  Clinical Data:  Chest pain.  Shortness of breath.  CHEST - 2 VIEW  Comparison: Chest x-ray 12/12/2011.  Findings: Lung volumes are normal.  No consolidative airspace disease.  No pleural effusions.  No pneumothorax.  No pulmonary nodule or mass noted.  Pulmonary vasculature and the cardiomediastinal silhouette are within normal limits.  IMPRESSION: 1. No radiographic evidence of acute cardiopulmonary disease.   Original Report Authenticated By: Trudie Reed, M.D.     EKG:  Sinus rhythm with variable block. PR prolongation was not noted. Exercise testing Initially gave rise to 2:1 block and then with increasing sinus rates 3:1 heart block.   Assessment and Plan: *   Alyssa Dudley

## 2013-01-07 NOTE — Patient Instructions (Addendum)
Your physician has requested that you have a cardiac MRI. Cardiac MRI uses a computer to create images of your heart as its beating, producing both still and moving pictures of your heart and major blood vessels. For further information please visit [email or web address]. Please follow the instruction sheet given to you today for more information.  Your physician recommends that you return for lab work in: today (cbc, bmet, tsh, ace, calcium)   Your physician has recommended that you have a pacemaker inserted. A pacemaker is a small device that is placed under the skin of your chest or abdomen to help control abnormal heart rhythms. This device uses electrical pulses to prompt the heart to beat at a normal rate. Pacemakers are used to treat heart rhythms that are too slow. Wire (leads) are attached to the pacemaker that goes into the chambers of you heart. This is done in the hospital and usually requires and overnight stay. Please see the instruction sheet given to you today for more information.

## 2013-01-07 NOTE — Progress Notes (Unsigned)
Stress Echo cancelled per Dr Graciela Husbands, patient went into 2nd Degree AV Block.

## 2013-01-07 NOTE — Progress Notes (Signed)
Echocardiogram performed.  

## 2013-01-07 NOTE — Assessment & Plan Note (Signed)
This young lady has symptomatic high-grade infra-hisian heart block.   She will need pacing. We have discussed risks and benefits.  Her symptoms have dated back about 6 weeks. It is important to exclude structural heart disease, specifically I worry about sarcoid. She will undergo an echo today. We'll measure ACE level and TSH, Ca and BMET She is scheduled for a cardiac MRI.

## 2013-01-08 ENCOUNTER — Encounter (HOSPITAL_COMMUNITY): Payer: Self-pay | Admitting: Internal Medicine

## 2013-01-08 ENCOUNTER — Encounter (HOSPITAL_COMMUNITY): Payer: Self-pay

## 2013-01-08 ENCOUNTER — Other Ambulatory Visit: Payer: Self-pay | Admitting: *Deleted

## 2013-01-08 DIAGNOSIS — I441 Atrioventricular block, second degree: Secondary | ICD-10-CM

## 2013-01-08 NOTE — Progress Notes (Signed)
Echo Report Faxed to Dr. Oneta Rack

## 2013-01-12 MED ORDER — SODIUM CHLORIDE 0.9 % IR SOLN
Freq: Once | Status: DC
  Filled 2013-01-12: qty 2

## 2013-01-12 MED ORDER — CEFAZOLIN SODIUM-DEXTROSE 2-3 GM-% IV SOLR
2.0000 g | INTRAVENOUS | Status: DC
  Filled 2013-01-12: qty 50

## 2013-01-13 ENCOUNTER — Encounter (HOSPITAL_COMMUNITY)
Admission: RE | Disposition: A | Discharge status: Home / Self Care | Payer: Self-pay | Source: Ambulatory Visit | Attending: Internal Medicine

## 2013-01-13 ENCOUNTER — Ambulatory Visit (HOSPITAL_COMMUNITY)
Admission: RE | Admit: 2013-01-13 | Discharge: 2013-01-14 | Disposition: A | Discharge status: Home / Self Care | Payer: BC Managed Care – PPO | Source: Ambulatory Visit | Attending: Internal Medicine | Admitting: Internal Medicine

## 2013-01-13 ENCOUNTER — Telehealth: Payer: Self-pay | Admitting: Internal Medicine

## 2013-01-13 ENCOUNTER — Other Ambulatory Visit (HOSPITAL_COMMUNITY): Payer: Self-pay | Admitting: Internal Medicine

## 2013-01-13 ENCOUNTER — Ambulatory Visit (HOSPITAL_COMMUNITY)
Admission: RE | Admit: 2013-01-13 | Discharge: 2013-01-13 | Disposition: A | Discharge status: Home / Self Care | Payer: BC Managed Care – PPO | Source: Ambulatory Visit | Attending: Internal Medicine | Admitting: Internal Medicine

## 2013-01-13 ENCOUNTER — Other Ambulatory Visit: Payer: Self-pay | Admitting: Cardiology

## 2013-01-13 DIAGNOSIS — I459 Conduction disorder, unspecified: Secondary | ICD-10-CM

## 2013-01-13 DIAGNOSIS — E785 Hyperlipidemia, unspecified: Secondary | ICD-10-CM | POA: Insufficient documentation

## 2013-01-13 DIAGNOSIS — I442 Atrioventricular block, complete: Secondary | ICD-10-CM

## 2013-01-13 DIAGNOSIS — I441 Atrioventricular block, second degree: Secondary | ICD-10-CM

## 2013-01-13 HISTORY — PX: PERMANENT PACEMAKER INSERTION: SHX5480

## 2013-01-13 LAB — SURGICAL PCR SCREEN
MRSA, PCR: NEGATIVE
Staphylococcus aureus: NEGATIVE

## 2013-01-13 SURGERY — PERMANENT PACEMAKER INSERTION
Anesthesia: LOCAL

## 2013-01-13 MED ORDER — CHLORHEXIDINE GLUCONATE 4 % EX LIQD: 60.0000 mL | Freq: Once | CUTANEOUS | Status: DC

## 2013-01-13 MED ORDER — SODIUM CHLORIDE 0.9 % IV SOLN
INTRAVENOUS | Status: DC
  Administered 2013-01-13: 12:00:00 via INTRAVENOUS

## 2013-01-13 MED ORDER — GENTAMICIN SULFATE 40 MG/ML IJ SOLN: 80.0000 mg | INTRAMUSCULAR | Status: DC

## 2013-01-13 MED ORDER — CEFAZOLIN SODIUM-DEXTROSE 2-3 GM-% IV SOLR: 2.0000 g | INTRAVENOUS | Status: DC

## 2013-01-13 MED ORDER — CEFAZOLIN SODIUM 1-5 GM-% IV SOLN
1.0000 g | Freq: Four times a day (QID) | INTRAVENOUS | Status: DC
  Filled 2013-01-13 (×3): qty 50

## 2013-01-13 MED ORDER — GADOBENATE DIMEGLUMINE 529 MG/ML IV SOLN
25.0000 mL | Freq: Once | INTRAVENOUS | Status: AC
  Administered 2013-01-13: 25 mL via INTRAVENOUS

## 2013-01-13 MED ORDER — MUPIROCIN 2 % EX OINT
TOPICAL_OINTMENT | Freq: Two times a day (BID) | CUTANEOUS | Status: DC
  Administered 2013-01-13: 1 via NASAL

## 2013-01-13 MED ORDER — LIDOCAINE HCL (PF) 1 % IJ SOLN
INTRAMUSCULAR | Status: AC
  Filled 2013-01-13: qty 60

## 2013-01-13 MED ORDER — HYDROCODONE-ACETAMINOPHEN 5-325 MG PO TABS
1.0000 | ORAL_TABLET | ORAL | Status: DC | PRN
  Administered 2013-01-13: 1 via ORAL
  Filled 2013-01-13: qty 1

## 2013-01-13 MED ORDER — MUPIROCIN 2 % EX OINT
TOPICAL_OINTMENT | CUTANEOUS | Status: AC
  Filled 2013-01-13: qty 22

## 2013-01-13 MED ORDER — ASPIRIN EC 81 MG PO TBEC
81.0000 mg | DELAYED_RELEASE_TABLET | Freq: Every day | ORAL | Status: DC
  Administered 2013-01-13: 81 mg via ORAL
  Filled 2013-01-13 (×2): qty 1

## 2013-01-13 MED ORDER — SODIUM CHLORIDE 0.9 % IJ SOLN: 3.0000 mL | INTRAMUSCULAR | Status: DC | PRN

## 2013-01-13 MED ORDER — MIDAZOLAM HCL 5 MG/5ML IJ SOLN
INTRAMUSCULAR | Status: AC
  Filled 2013-01-13: qty 5

## 2013-01-13 MED ORDER — ACETAMINOPHEN 325 MG PO TABS
325.0000 mg | ORAL_TABLET | ORAL | Status: DC | PRN
  Administered 2013-01-13: 650 mg via ORAL
  Filled 2013-01-13: qty 2

## 2013-01-13 MED ORDER — SODIUM CHLORIDE 0.9 % IV SOLN: 250.0000 mL | INTRAVENOUS | Status: DC | PRN

## 2013-01-13 MED ORDER — CEFAZOLIN SODIUM 1-5 GM-% IV SOLN
1.0000 g | Freq: Four times a day (QID) | INTRAVENOUS | Status: AC
  Administered 2013-01-13 – 2013-01-14 (×3): 1 g via INTRAVENOUS
  Filled 2013-01-13 (×4): qty 50

## 2013-01-13 MED ORDER — HEPARIN (PORCINE) IN NACL 2-0.9 UNIT/ML-% IJ SOLN
INTRAMUSCULAR | Status: AC
  Filled 2013-01-13: qty 500

## 2013-01-13 MED ORDER — SODIUM CHLORIDE 0.9 % IJ SOLN
3.0000 mL | Freq: Two times a day (BID) | INTRAMUSCULAR | Status: DC
  Administered 2013-01-13: 3 mL via INTRAVENOUS

## 2013-01-13 MED ORDER — FENTANYL CITRATE 0.05 MG/ML IJ SOLN
INTRAMUSCULAR | Status: AC
  Filled 2013-01-13: qty 2

## 2013-01-13 MED ORDER — ONDANSETRON HCL 4 MG/2ML IJ SOLN: 4.0000 mg | Freq: Four times a day (QID) | INTRAMUSCULAR | Status: DC | PRN

## 2013-01-13 NOTE — Progress Notes (Signed)
Utilization Review Completed.Alyssa Dudley T4/05/2013  

## 2013-01-13 NOTE — Interval H&P Note (Signed)
History and Physical Interval Note:  01/13/2013 12:06 PM  Alyssa Dudley  has presented today for surgery, with the diagnosis of Heart block . The various methods of treatment have been discussed with the patient and family. After consideration of risks, benefits and other options for treatment, the patient has consented to  Procedure(s): PERMANENT PACEMAKER INSERTION (N/A) as a surgical intervention .  The patient's history has been reviewed, patient examined, no change in status, stable for surgery.  I have reviewed the patient's chart and labs.  Questions were answered to the patient's satisfaction.    The patient has symptomatic second degree AV block.  No reversible causes have been found.  I would therefore recommend pacemaker implantation at this time.  Risks, benefits, alternatives to pacemaker implantation were discussed in detail with the patient today. The patient understands that the risks include but are not limited to bleeding, infection, pneumothorax, perforation, tamponade, vascular damage, renal failure, MI, stroke, death,  and lead dislodgement and wishes to proceed.  We discussed pacemaker options including MRI compatablility (REVO) devices.  She would prefer a standard pacemaker rather than an MRI compatable one at this time.  We will therefore proceed with the procedure at this time.    Hillis Range

## 2013-01-13 NOTE — Telephone Encounter (Signed)
New problem   Alyssa Dudley/[facility] fax over a form for Dr to sign.Pt is at hospital now.

## 2013-01-13 NOTE — Telephone Encounter (Signed)
Barbaraann Cao to have [facility] page Janice Norrie for MD signature

## 2013-01-13 NOTE — Progress Notes (Signed)
Entered in error

## 2013-01-13 NOTE — H&P (View-Only) (Signed)
 ELECTROPHYSIOLOGY CONSULT NOTE  Patient ID: Alyssa Dudley, MRN: [id number], DOB/AGE: 11/14/1955 56 y.o. Admit date: (Not on file) Date of Consult: 01/07/2013  Primary Physician: STEVENSON, AMY, DO Primary Cardiologist: PR   Chief Complaint: heart block   HPI Alyssa Dudley is a 56 y.o. female  Is seen as an add on because of abnormal GXT   She has symptoms Exertional lightheadedness. There has been some chest discomfort. She has no constitutional symptoms.  During her treadmill portion of her Myoview, she was noted to have heart block. We repeated the treadmill and noted that she went from 3:2 block unassociated with PR prolongation with faster heart rates to 2:1 block and then was still faster heart rate 3:1 block.   No past medical history on file.    Surgical History:  Past Surgical History  Procedure Laterality Date  . Breast cyst excision  1980  . Wrist surgery  2011    removed cyst     Home Meds: Prior to Admission medications   Medication Sig Start Date End Date Taking? Authorizing Provider  cholecalciferol (VITAMIN D) 1000 UNITS tablet Take 2,000 Units by mouth daily.    Historical Provider, MD  Ibuprofen-Diphenhydramine HCl (ADVIL PM) 200-25 MG CAPS Take 1 tablet by mouth at bedtime as needed.      Historical Provider, MD  pravastatin (PRAVACHOL) 40 MG tablet Take 40 mg by mouth daily.  12/26/12   Historical Provider, MD       Allergies: No Known Allergies  History   Social History  . Marital Status: Single    Spouse Name: N/A    Number of Children: N/A  . Years of Education: N/A   Occupational History  . Not on file.   Social History Main Topics  . Smoking status: Never Smoker   . Smokeless tobacco: Never Used  . Alcohol Use: No  . Drug Use: No  . Sexually Active: Not on file   Other Topics Concern  . Not on file   Social History Narrative  . No narrative on file     Family History  Problem Relation Age of Onset  . Colon cancer Sister      ROS:  Please see the history of present illness.     All other systems reviewed and negative.    Physical Exam:   Vital signs are recorded as part of the treadmill report  General: Well developed, well nourished female in no acute distress. Head: Normocephalic, atraumatic, sclera non-icteric, no xanthomas, nares are without discharge. EENT: normal Lymph Nodes:  none Back: without scoliosis/kyphosis , no CVA tendersness Neck: Negative for carotid bruits. JVD not elevated. Lungs: Clear bilaterally to auscultation without wheezes, rales, or rhonchi. Breathing is unlabored. Heart: slow but RRR with S1 S2. 2/6 murmur , rubs, or gallops appreciated. Abdomen: Soft, non-tender, non-distended with normoactive bowel sounds. No hepatomegaly. No rebound/guarding. No obvious abdominal masses. Msk:  Strength and tone appear normal for age. Extremities: No clubbing or cyanosis. No  edema.  Distal pedal pulses are 2+ and equal bilaterally. Skin: Warm and Dry Neuro: Alert and oriented X 3. CN III-XII intact Grossly normal sensory and motor function . Psych:  Responds to questions appropriately with a normal affect.      Labs: Cardiac Enzymes No results found for this basename: CKTOTAL, CKMB, TROPONINI,  in the last 72 hours CBC Lab Results  Component Value Date   WBC 5.8 04/04/2011   HGB 13.1 04/04/2011   HCT 37.7 04/04/2011     MCV 87.9 04/04/2011   PLT 221 04/04/2011   PROTIME: No results found for this basename: LABPROT, INR,  in the last 72 hours Chemistry No results found for this basename: NA, K, CL, CO2, BUN, CREATININE, CALCIUM, LABALBU, PROT, BILITOT, ALKPHOS, ALT, AST, GLUCOSE,  in the last 168 hours Lipids No results found for this basename: CHOL, HDL, LDLCALC, TRIG   BNP No results found for this basename: probnp   Miscellaneous Lab Results  Component Value Date   DDIMER <0.27 01/05/2013    Radiology/Studies:  Dg Chest 2 View  12/25/2012  *RADIOLOGY REPORT*  Clinical Data:  Chest pain.  Shortness of breath.  CHEST - 2 VIEW  Comparison: Chest x-ray 12/12/2011.  Findings: Lung volumes are normal.  No consolidative airspace disease.  No pleural effusions.  No pneumothorax.  No pulmonary nodule or mass noted.  Pulmonary vasculature and the cardiomediastinal silhouette are within normal limits.  IMPRESSION: 1. No radiographic evidence of acute cardiopulmonary disease.   Original Report Authenticated By: Daniel Entrikin, M.D.     EKG:  Sinus rhythm with variable block. PR prolongation was not noted. Exercise testing Initially gave rise to 2:1 block and then with increasing sinus rates 3:1 heart block.   Assessment and Plan: *   Alyssa Dudley   

## 2013-01-13 NOTE — Op Note (Signed)
SURGEON:  Hillis Range, MD     PREPROCEDURE DIAGNOSIS:  Symptomatic mobitz II Second degree AV block    POSTPROCEDURE DIAGNOSIS:  Symptomatic mobitz II Second degree AV block     PROCEDURES:   1. Left upper extremity venography.   2. Pacemaker implantation.     INTRODUCTION: Alyssa Dudley is a 57 y.o. female  with a history of mobitz II Second degree AV block who presents today for pacemaker implantation.  The patient reports intermittent episodes of decreased exercise tolerance and SOB over the past few months.  Recent GXT revealed mobitz II Second degree AV block.  No reversible causes have been identified.  The patient therefore presents today for pacemaker implantation.     DESCRIPTION OF PROCEDURE:  Informed written consent was obtained, and the patient was brought to the electrophysiology lab in a fasting state.  The patient required no sedation for the procedure today.  The patients left chest was prepped and draped in the usual sterile fashion by the EP lab staff. The skin overlying the left deltopectoral region was infiltrated with lidocaine for local analgesia.  A 4-cm incision was made over the left deltopectoral region.  A left subcutaneous pacemaker pocket was fashioned using a combination of sharp and blunt dissection. Electrocautery was required to assure hemostasis.    Left Upper Extremity Venography: A venogram of the left upper extremity was performed, which revealed a large left cephalic vein, which emptied into a large left subclavian vein.  The left axillary vein was moderate in size.  The left axillary vein was very high in its coarse.  RA/RV Lead Placement: The left axillary vein was therefore cannulated.  Through the left axillary vein, a Public house manager model [id number] (serial number [id number]) right atrial lead and a St Jude Medical model 1948- 58 (serial number [id number]) right ventricular lead were advanced with fluoroscopic visualization into the right atrial appendage  and right ventricular apex positions respectively.  Initial atrial lead P- waves measured 2.5 mV with impedance of 415 ohms and a threshold of 0.8 V at 0.5 msec.  Right ventricular lead R-waves measured 13 mV with an impedance of 804 ohms and a threshold of 0.6 V at 0.5 msec.  Both leads were secured to the pectoralis fascia using #2-0 silk over the suture sleeves.  Atrial pacing was performed which revealed mobitz II second degree AV block at 80 bpm.  Device Placement: The leads were then connected to a [employer] Accent DR RF model [id number] (serial number  [id number]) pacemaker.  The pocket was irrigated with copious gentamicin solution.  The pacemaker was then placed into the pocket.  The pocket was then closed in 2 layers with 2.0 Vicryl suture for the subcutaneous and subcuticular layers.  Steri- Strips and a sterile dressing were then applied.  There were no early apparent complications.     CONCLUSIONS:   1. Successful implantation of a Industrial/product designer DR RF dual-chamber pacemaker for symptomatic mobitz II Second degree AV block      (mobitz II Second degree AV block with atrial pacing at 80 bpm today)  2. No early apparent complications.           Hillis Range, MD 01/13/2013 2:07 PM

## 2013-01-14 ENCOUNTER — Encounter: Payer: Self-pay | Admitting: Internal Medicine

## 2013-01-14 ENCOUNTER — Encounter (HOSPITAL_COMMUNITY): Payer: Self-pay | Admitting: *Deleted

## 2013-01-14 ENCOUNTER — Ambulatory Visit (HOSPITAL_COMMUNITY): Payer: BC Managed Care – PPO

## 2013-01-14 DIAGNOSIS — I441 Atrioventricular block, second degree: Secondary | ICD-10-CM

## 2013-01-14 LAB — BASIC METABOLIC PANEL
BUN: 12 mg/dL (ref 6–23)
CO2: 28 mEq/L (ref 19–32)
Calcium: 9.1 mg/dL (ref 8.4–10.5)
Chloride: 107 mEq/L (ref 96–112)
Creatinine, Ser: 0.69 mg/dL (ref 0.50–1.10)
GFR calc Af Amer: >90 mL/min (ref >90)
GFR calc non Af Amer: >90 mL/min (ref >90)
Glucose, Bld: 91 mg/dL (ref 70–99)
Potassium: 3.8 mEq/L (ref 3.5–5.1)
Sodium: 141 mEq/L (ref 135–145)

## 2013-01-14 NOTE — Progress Notes (Signed)
Doing well s/p PPM CXR reviewed and reveals stable leads/ no ptx Pacemaker interrogation is reviewed and normal (See paper chart)  Routine wound care and follow-up DC to home.

## 2013-01-14 NOTE — Discharge Summary (Signed)
ELECTROPHYSIOLOGY DISCHARGE SUMMARY    Patient ID: Alyssa Dudley,  MRN: [id number], DOB/AGE: 1955/10/26 57 y.o.  Admit date: 01/13/2013 Discharge date: 01/14/2013  Primary Care Physician: [name], MD Primary Cardiologist: Graciela Husbands, MD  Primary Discharge Diagnosis:  1. Symptomatic Mobitz II AV block s/p PPM implantation  Secondary Discharge Diagnoses:  1. Dyslipidemia  Procedures This Admission:  1. Cardiac MRI - results pending at the time of discharge 2. PPM implantation  RA lead Data processing manager model [id number] (serial number [id number]) right atrial lead  RV lead - St Jude Medical model [id number] (serial number [id number]) right ventricular lead  Device - [address] Jude Medical Accent DR RF model [id number] (serial number [id number]) pacemaker  History and Hospital Course:  Alyssa Dudley is a 57 year old woman with Mobitz II second degree AV block who presented yesterday for pacemaker implantation. She reports intermittent episodes of decreased exercise tolerance and SOB over the past few months. Recent GXT revealed mobitz II Second degree AV block. No reversible causes have been identified therefore she presented yesterday for pacemaker implantation. [name] tolerated this procedure well without any immediate complication. She remains hemodynamically stable and afebrile. Her chest xray shows stable lead placement without pneumothorax. Her device interrogation shows normal PPM function with stable lead parameters/measurements. Her implant site is intact without significant bleeding or hematoma. She has been given discharge instructions including wound care and activity restrictions. She will follow-up in 10 days for wound check. There were no changes made to her medications. She will return to work in one week with activity restrictions for 6 weeks (no lifting >5 lbs for 6 weeks). She has been seen, examined and deemed stable for discharge today by Dr. Hillis Range.  Physical Exam: Vitals:  Blood pressure 118/71, pulse 60, temperature 97.7 F (36.5 C), temperature source Oral, resp. rate 18, height 5\' 2"  (1.575 m), weight 179 lb 3.7 oz (81.3 kg), SpO2 100.00%.  General: WD, well appearing 57 year old female in no acute distress. Heart: RRR. S1, S2 without murmur, rub or S3.  Lungs: CTA bilaterally. No wheezes, rales or rhonchi. Extremities: No cyanosis, clubbing or edema.  Skin: Left upper chest/implant site intact without bleeding or hematoma.  Labs: Lab Results  Component Value Date   WBC 5.8 01/07/2013   HGB 14.4 01/07/2013   HCT 40.8 01/07/2013   MCV 84.5 01/07/2013   PLT 256 01/07/2013     Recent Labs Lab 01/14/13 0605  NA 141  K 3.8  CL 107  CO2 28  BUN 12  CREATININE 0.69  CALCIUM 9.1  GLUCOSE 91    Disposition:  The patient is being discharged in stable condition.  Follow-up:     Follow-up Information   Follow up with [facility] On 01/22/2013. (At 9:00 AM for wound check)    Contact information:   [address] [place] [address] [phone]      Follow up with Hillis Range, MD On 02/26/2013. (At 11:45 AM)    Contact information:   [address] [place] [address] [phone]     Discharge Medications:    Medication List    TAKE these medications       aspirin EC 81 MG tablet  Take 81 mg by mouth daily.     ibuprofen 200 MG tablet  Commonly known as:  ADVIL,MOTRIN  Take 200 mg by mouth daily as needed for pain or headache.     pravastatin  40 MG tablet  Commonly known as:  PRAVACHOL  Take 40 mg by mouth daily.     Vitamin D2 2000 UNITS Tabs  Take 1 tablet by mouth daily.       Duration of Discharge Encounter: Greater than 30 minutes including physician time.  Limmie Patricia, PA-C 01/14/2013, 9:10 AM   Hillis Range, MD

## 2013-01-14 NOTE — Progress Notes (Signed)
Discharge instructions and patient education completed. Pacemaker discharge video viewed by patient. IV site d/c. Site WNL. Pacemaker incision WNL. No further questions. D/C home with daughter. Dion Saucier

## 2013-01-16 ENCOUNTER — Encounter: Payer: Self-pay | Admitting: Internal Medicine

## 2013-01-22 ENCOUNTER — Other Ambulatory Visit: Payer: Self-pay | Admitting: Internal Medicine

## 2013-01-22 ENCOUNTER — Ambulatory Visit (INDEPENDENT_AMBULATORY_CARE_PROVIDER_SITE_OTHER): Payer: BC Managed Care – PPO | Admitting: *Deleted

## 2013-01-22 DIAGNOSIS — I441 Atrioventricular block, second degree: Secondary | ICD-10-CM

## 2013-01-22 LAB — PACEMAKER DEVICE OBSERVATION
AL AMPLITUDE: 4.8 mv
AL IMPEDENCE PM: 387.5 Ohm
AL THRESHOLD: 0.625 V
ATRIAL PACING PM: 26
BAMS-0001: 180 {beats}/min
BAMS-0003: 70 {beats}/min
BATTERY VOLTAGE: 3.008 V
DEVICE MODEL PM: 7457437
RV LEAD AMPLITUDE: 12 mv
RV LEAD IMPEDENCE PM: 650 Ohm
RV LEAD THRESHOLD: 0.5 V
VENTRICULAR PACING PM: 12

## 2013-01-22 NOTE — Progress Notes (Signed)
Wound check-PPM 

## 2013-01-27 ENCOUNTER — Telehealth: Payer: Self-pay | Admitting: Internal Medicine

## 2013-01-27 MED ORDER — AMOXICILLIN 500 MG PO TABS: 2000.0000 mg | ORAL_TABLET | Freq: Once | ORAL | Status: DC

## 2013-01-27 NOTE — Telephone Encounter (Signed)
Pt aware of need for SBE no allergies. Rx sent into pharmacy

## 2013-01-27 NOTE — Telephone Encounter (Signed)
New problem    Pt is having dental procedure and wants to know if she needs to take an antibiotic before

## 2013-01-27 NOTE — Telephone Encounter (Signed)
No indication per echo for need of SBE however pt did have a recent device placement.  Will verify no SBE is needed for that.  Pt states understanding.

## 2013-02-12 ENCOUNTER — Encounter: Payer: Self-pay | Admitting: Internal Medicine

## 2013-02-26 ENCOUNTER — Ambulatory Visit (INDEPENDENT_AMBULATORY_CARE_PROVIDER_SITE_OTHER): Payer: BC Managed Care – PPO | Admitting: Internal Medicine

## 2013-02-26 ENCOUNTER — Encounter: Payer: Self-pay | Admitting: Internal Medicine

## 2013-02-26 VITALS — BP 120/70 | HR 70 | Ht 63.0 in | Wt 181.0 lb

## 2013-02-26 DIAGNOSIS — I441 Atrioventricular block, second degree: Secondary | ICD-10-CM

## 2013-02-26 LAB — PACEMAKER DEVICE OBSERVATION
AL IMPEDENCE PM: 425 Ohm
AL THRESHOLD: 0.375 V
ATRIAL PACING PM: 26
BAMS-0001: 180 {beats}/min
BAMS-0003: 70 {beats}/min
BATTERY VOLTAGE: 3.0381 V
DEVICE MODEL PM: 7457437
RV LEAD IMPEDENCE PM: 712.5 Ohm
RV LEAD THRESHOLD: 0.375 V
VENTRICULAR PACING PM: 32

## 2013-02-26 NOTE — Patient Instructions (Addendum)
Your physician recommends that you schedule a follow-up appointment in 3 months with Dr Allred    

## 2013-02-26 NOTE — Progress Notes (Signed)
PCP: STEVENSON, AMY, DO   Alyssa Dudley is a 57 y.o. female who presents today for routine electrophysiology followup.  Since her recent PPM implant, the patient reports doing very well.  Her exercise tolerance has significantly improved.  Today, she denies symptoms of palpitations, chest pain, shortness of breath,  lower extremity edema, dizziness, presyncope, or syncope.  The patient is otherwise without complaint today.   Past Medical History  Diagnosis Date  . Second degree Mobitz II AV block    Past Surgical History  Procedure Laterality Date  . Breast cyst excision  1980  . Wrist surgery  2011    removed cyst  . Pacemaker insertion  15-May-2057    [employer] Accent DR RF implanted by Dr Johney Frame for mobitz II AV block    Current Outpatient Prescriptions  Medication Sig Dispense Refill  . amoxicillin (AMOXIL) 500 MG tablet Take 4 tablets (2,000 mg total) by mouth once.  4 tablet  0  . aspirin EC 81 MG tablet Take 81 mg by mouth daily.      . ERGOCALCIFEROL PO Take 5,000 mg by mouth daily.      Marland Kitchen ibuprofen (ADVIL,MOTRIN) 200 MG tablet Take 200 mg by mouth daily as needed for pain or headache.      . pravastatin (PRAVACHOL) 40 MG tablet Take 40 mg by mouth daily.        No current facility-administered medications for this visit.    Physical Exam: Filed Vitals:   02/26/13 1148  BP: 120/70  Pulse: 70  Height: 5\' 3"  (1.6 m)  Weight: 181 lb (82.101 kg)  SpO2: 99%    GEN- The patient is well appearing, alert and oriented x 3 today.   Head- normocephalic, atraumatic Eyes-  Sclera clear, conjunctiva pink Ears- hearing intact Oropharynx- clear Lungs- Clear to ausculation bilaterally, normal work of breathing Chest- pacemaker pocket is well healed Heart- Regular rate and rhythm, no murmurs, rubs or gallops, PMI not laterally displaced GI- soft, NT, ND, + BS Extremities- no clubbing, cyanosis, or edema  Pacemaker interrogation- reviewed in detail today,  See PACEART  report  Assessment and Plan:  1. Mobitz II AV block Normal pacemaker function See Pace Art report No changes today  Return in 3 months

## 2013-03-04 ENCOUNTER — Telehealth: Payer: Self-pay | Admitting: Internal Medicine

## 2013-03-04 NOTE — Telephone Encounter (Signed)
Walk in pt Form " Attending Physicians Statement" Dropped Off by pt  Sent to [facility] No ROI/PMT collected 03/04/13/KM

## 2013-03-11 ENCOUNTER — Encounter: Payer: Self-pay | Admitting: Internal Medicine

## 2013-04-06 ENCOUNTER — Telehealth: Payer: Self-pay | Admitting: Internal Medicine

## 2013-04-06 NOTE — Telephone Encounter (Signed)
Called Pt To Let Her Know Attending physicians Statement Ready For Pick up  Phone Busy 04/06/13/KM

## 2013-05-22 ENCOUNTER — Encounter: Payer: Self-pay | Admitting: Internal Medicine

## 2013-05-22 ENCOUNTER — Ambulatory Visit (INDEPENDENT_AMBULATORY_CARE_PROVIDER_SITE_OTHER): Payer: BC Managed Care – PPO | Admitting: *Deleted

## 2013-05-22 DIAGNOSIS — I441 Atrioventricular block, second degree: Secondary | ICD-10-CM

## 2013-05-22 NOTE — Progress Notes (Signed)
Pt came in today c/o of pain when breathing in her lower left rib vicinity. Device check normal, all tests consistent with previous measurements, no programming changes made, full details in PaceArt.  ROV w/ Dr. Johney Frame 06/04/13.

## 2013-05-25 LAB — PACEMAKER DEVICE OBSERVATION
AL AMPLITUDE: 3.6 mv
AL IMPEDENCE PM: 525 Ohm
AL THRESHOLD: 0.5 V
ATRIAL PACING PM: 29
BAMS-0001: 180 {beats}/min
BAMS-0003: 70 {beats}/min
BATTERY VOLTAGE: 3.008 V
DEVICE MODEL PM: 7457437
RV LEAD AMPLITUDE: 12 mv
RV LEAD IMPEDENCE PM: 650 Ohm
RV LEAD THRESHOLD: 0.375 V
VENTRICULAR PACING PM: 86

## 2013-06-04 ENCOUNTER — Ambulatory Visit (INDEPENDENT_AMBULATORY_CARE_PROVIDER_SITE_OTHER): Payer: BC Managed Care – PPO | Admitting: Internal Medicine

## 2013-06-04 ENCOUNTER — Encounter: Payer: Self-pay | Admitting: Internal Medicine

## 2013-06-04 VITALS — BP 110/76 | HR 67 | Ht 66.0 in | Wt 179.6 lb

## 2013-06-04 DIAGNOSIS — I441 Atrioventricular block, second degree: Secondary | ICD-10-CM

## 2013-06-04 LAB — PACEMAKER DEVICE OBSERVATION
AL AMPLITUDE: 3.4 mv
AL IMPEDENCE PM: 537.5 Ohm
AL THRESHOLD: 0.5 V
ATRIAL PACING PM: 33
BAMS-0001: 180 {beats}/min
BAMS-0003: 70 {beats}/min
BATTERY VOLTAGE: 3.008 V
DEVICE MODEL PM: 7457437
RV LEAD AMPLITUDE: 12 mv
RV LEAD IMPEDENCE PM: 687.5 Ohm
RV LEAD THRESHOLD: 0.5 V
VENTRICULAR PACING PM: 89

## 2013-06-04 NOTE — Progress Notes (Signed)
PCP: STEVENSON, AMY, DO   Alyssa Dudley is a 57 y.o. female who presents today for routine electrophysiology followup.  Since her recent PPM implant, the patient reports doing very well.  Her exercise tolerance has significantly improved.   She is now exercising without significant limitation. Today, she denies symptoms of palpitations, chest pain, shortness of breath,  lower extremity edema, dizziness, presyncope, or syncope.  The patient is otherwise without complaint today.   Past Medical History  Diagnosis Date  . Second degree Mobitz II AV block    Past Surgical History  Procedure Laterality Date  . Breast cyst excision  1980  . Wrist surgery  2011    removed cyst  . Pacemaker insertion  2057-08-14    SJM Accent DR RF implanted by Dr Johney Frame for mobitz II AV block    Current Outpatient Prescriptions  Medication Sig Dispense Refill  . ibuprofen (ADVIL,MOTRIN) 200 MG tablet Take 200 mg by mouth daily as needed for pain or headache.      . pravastatin (PRAVACHOL) 40 MG tablet Take 40 mg by mouth daily.        No current facility-administered medications for this visit.    Physical Exam: Filed Vitals:   06/04/13 0847  BP: 110/76  Pulse: 67  Height: 5\' 6"  (1.676 m)  Weight: 179 lb 9.6 oz (81.466 kg)    GEN- The patient is well appearing, alert and oriented x 3 today.   Head- normocephalic, atraumatic Eyes-  Sclera clear, conjunctiva pink Ears- hearing intact Oropharynx- clear Lungs- Clear to ausculation bilaterally, normal work of breathing Chest- pacemaker pocket is well healed Heart- Regular rate and rhythm, no murmurs, rubs or gallops, PMI not laterally displaced GI- soft, NT, ND, + BS Extremities- no clubbing, cyanosis, or edema  Pacemaker interrogation- reviewed in detail today,  See PACEART report  Assessment and Plan:  1. Mobitz II AV block Normal pacemaker function See Pace Art report No changes today  Merlin Return in 12 months

## 2013-06-04 NOTE — Patient Instructions (Addendum)
Your physician wants you to follow-up in: 12 months with Dr Jacquiline Doe will receive a reminder letter in the mail two months in advance. If you don't receive a letter, please call our office to schedule the follow-up appointment.  Remote monitoring is used to monitor your Pacemaker of ICD from home. This monitoring reduces the number of office visits required to check your device to one time per year. It allows [place] to keep an eye on the functioning of your device to ensure it is working properly. You are scheduled for a device check from home on 09/07/13. You may send your transmission at any time that day. If you have a wireless device, the transmission will be sent automatically. After your physician reviews your transmission, you will receive a postcard with your next transmission date.

## 2013-08-13 ENCOUNTER — Other Ambulatory Visit: Payer: Self-pay

## 2013-09-07 ENCOUNTER — Encounter (INDEPENDENT_AMBULATORY_CARE_PROVIDER_SITE_OTHER): Payer: BC Managed Care – PPO | Admitting: *Deleted

## 2013-09-07 ENCOUNTER — Encounter: Payer: Self-pay | Admitting: Internal Medicine

## 2013-09-07 DIAGNOSIS — I442 Atrioventricular block, complete: Secondary | ICD-10-CM

## 2013-09-07 DIAGNOSIS — I441 Atrioventricular block, second degree: Secondary | ICD-10-CM

## 2013-09-07 LAB — MDC_IDC_ENUM_SESS_TYPE_REMOTE
Battery Remaining Longevity: 143 mo
Battery Voltage: 3.01 V
Brady Statistic AP VP Percent: 26 %
Brady Statistic AP VS Percent: 2.5 %
Brady Statistic AS VP Percent: 68 %
Brady Statistic AS VS Percent: 3 %
Brady Statistic RA Percent Paced: 28 %
Brady Statistic RV Percent Paced: 94 %
Date Time Interrogation Session: 20141201082938
Implantable Pulse Generator Model: 2210
Implantable Pulse Generator Serial Number: 7457437
Lead Channel Impedance Value: 490 Ohm
Lead Channel Impedance Value: 640 Ohm
Lead Channel Pacing Threshold Amplitude: 0.375 V
Lead Channel Pacing Threshold Amplitude: 0.5 V
Lead Channel Pacing Threshold Pulse Width: 0.4 ms
Lead Channel Pacing Threshold Pulse Width: 0.4 ms
Lead Channel Sensing Intrinsic Amplitude: 12 mV
Lead Channel Sensing Intrinsic Amplitude: 2.6 mV
Lead Channel Setting Pacing Amplitude: 0.75 V
Lead Channel Setting Pacing Amplitude: 1.375
Lead Channel Setting Pacing Pulse Width: 0.4 ms
Lead Channel Setting Sensing Sensitivity: 2 mV

## 2013-09-13 ENCOUNTER — Other Ambulatory Visit: Payer: Self-pay | Admitting: Internal Medicine

## 2013-09-22 ENCOUNTER — Encounter: Payer: Self-pay | Admitting: *Deleted

## 2013-10-13 ENCOUNTER — Ambulatory Visit (INDEPENDENT_AMBULATORY_CARE_PROVIDER_SITE_OTHER): Payer: BC Managed Care – PPO | Admitting: Emergency Medicine

## 2013-10-13 ENCOUNTER — Encounter: Payer: Self-pay | Admitting: Emergency Medicine

## 2013-10-13 ENCOUNTER — Ambulatory Visit
Admission: RE | Admit: 2013-10-13 | Discharge: 2013-10-13 | Disposition: A | Discharge status: Home / Self Care | Payer: BC Managed Care – PPO | Source: Ambulatory Visit | Attending: Emergency Medicine | Admitting: Emergency Medicine

## 2013-10-13 VITALS — BP 122/78 | HR 74 | Temp 98.2°F | Resp 18 | Ht 64.5 in | Wt 184.0 lb

## 2013-10-13 DIAGNOSIS — R7309 Other abnormal glucose: Secondary | ICD-10-CM

## 2013-10-13 DIAGNOSIS — M25469 Effusion, unspecified knee: Secondary | ICD-10-CM

## 2013-10-13 DIAGNOSIS — E782 Mixed hyperlipidemia: Secondary | ICD-10-CM

## 2013-10-13 DIAGNOSIS — M25461 Effusion, right knee: Secondary | ICD-10-CM

## 2013-10-13 DIAGNOSIS — I10 Essential (primary) hypertension: Secondary | ICD-10-CM

## 2013-10-13 DIAGNOSIS — J309 Allergic rhinitis, unspecified: Secondary | ICD-10-CM

## 2013-10-13 LAB — CBC WITH DIFFERENTIAL/PLATELET
Basophils Absolute: 0 10*3/uL (ref 0.0–0.1)
Basophils Relative: 1 % (ref 0–1)
Eosinophils Absolute: 0.1 10*3/uL (ref 0.0–0.7)
Eosinophils Relative: 2 % (ref 0–5)
HCT: 41.6 % (ref 36.0–46.0)
Hemoglobin: 14.4 g/dL (ref 12.0–15.0)
Lymphocytes Relative: 45 % (ref 12–46)
Lymphs Abs: 1.8 10*3/uL (ref 0.7–4.0)
MCH: 31.4 pg (ref 26.0–34.0)
MCHC: 34.6 g/dL (ref 30.0–36.0)
MCV: 90.6 fL (ref 78.0–100.0)
Monocytes Absolute: 0.5 10*3/uL (ref 0.1–1.0)
Monocytes Relative: 11 % (ref 3–12)
Neutro Abs: 1.7 10*3/uL (ref 1.7–7.7)
Neutrophils Relative %: 41 % — ABNORMAL LOW (ref 43–77)
Platelets: 233 10*3/uL (ref 150–400)
RBC: 4.59 MIL/uL (ref 3.87–5.11)
RDW: 14.1 % (ref 11.5–15.5)
WBC: 4 10*3/uL (ref 4.0–10.5)

## 2013-10-13 LAB — HEMOGLOBIN A1C
Hgb A1c MFr Bld: 5.8 % — ABNORMAL HIGH (ref <5.7)
Mean Plasma Glucose: 120 mg/dL — ABNORMAL HIGH (ref <117)

## 2013-10-13 NOTE — Patient Instructions (Signed)
Fat and Cholesterol Control Diet  Fat and cholesterol levels in your blood and organs are influenced by your diet. High levels of fat and cholesterol may lead to diseases of the heart, small and large blood vessels, gallbladder, liver, and pancreas.  CONTROLLING FAT AND CHOLESTEROL WITH DIET  Although exercise and lifestyle factors are important, your diet is key. That is because certain foods are known to raise cholesterol and others to lower it. The goal is to balance foods for their effect on cholesterol and more importantly, to replace saturated and trans fat with other types of fat, such as monounsaturated fat, polyunsaturated fat, and omega-3 fatty acids.  On average, a person should consume no more than 15 to 17 g of saturated fat daily. Saturated and trans fats are considered "bad" fats, and they will raise LDL cholesterol. Saturated fats are primarily found in animal products such as meats, butter, and cream. However, that does not mean you need to give up all your favorite foods. Today, there are good tasting, low-fat, low-cholesterol substitutes for most of the things you like to eat. Choose low-fat or nonfat alternatives. Choose round or loin cuts of red meat. These types of cuts are lowest in fat and cholesterol. Chicken (without the skin), fish, veal, and ground turkey breast are great choices. Eliminate fatty meats, such as hot dogs and salami. Even shellfish have little or no saturated fat. Have a 3 oz (85 g) portion when you eat lean meat, poultry, or fish.  Trans fats are also called "partially hydrogenated oils." They are oils that have been scientifically manipulated so that they are solid at room temperature resulting in a longer shelf life and improved taste and texture of foods in which they are added. Trans fats are found in stick margarine, some tub margarines, cookies, crackers, and baked goods.   When baking and cooking, oils are a great substitute for butter. The monounsaturated oils are  especially beneficial since it is believed they lower LDL and raise HDL. The oils you should avoid entirely are saturated tropical oils, such as coconut and palm.   Remember to eat a lot from food groups that are naturally free of saturated and trans fat, including fish, fruit, vegetables, beans, grains (barley, rice, couscous, bulgur wheat), and pasta (without cream sauces).   IDENTIFYING FOODS THAT LOWER FAT AND CHOLESTEROL   Soluble fiber may lower your cholesterol. This type of fiber is found in fruits such as apples, vegetables such as broccoli, potatoes, and carrots, legumes such as beans, peas, and lentils, and grains such as barley. Foods fortified with plant sterols (phytosterol) may also lower cholesterol. You should eat at least 2 g per day of these foods for a cholesterol lowering effect.   Read package labels to identify low-saturated fats, trans fat free, and low-fat foods at the supermarket. Select cheeses that have only 2 to 3 g saturated fat per ounce. Use a heart-healthy tub margarine that is free of trans fats or partially hydrogenated oil. When buying baked goods (cookies, crackers), avoid partially hydrogenated oils. Breads and muffins should be made from whole grains (whole-wheat or whole oat flour, instead of "flour" or "enriched flour"). Buy non-creamy canned soups with reduced salt and no added fats.   FOOD PREPARATION TECHNIQUES   Never deep-fry. If you must fry, either stir-fry, which uses very little fat, or use non-stick cooking sprays. When possible, broil, bake, or roast meats, and steam vegetables. Instead of putting butter or margarine on vegetables, use lemon   and herbs, applesauce, and cinnamon (for squash and sweet potatoes). Use nonfat yogurt, salsa, and low-fat dressings for salads.   LOW-SATURATED FAT / LOW-FAT FOOD SUBSTITUTES  Meats / Saturated Fat (g)  · Avoid: Steak, marbled (3 oz/85 g) / 11 g  · Choose: Steak, lean (3 oz/85 g) / 4 g  · Avoid: Hamburger (3 oz/85 g) / 7  g  · Choose: Hamburger, lean (3 oz/85 g) / 5 g  · Avoid: Ham (3 oz/85 g) / 6 g  · Choose: Ham, lean cut (3 oz/85 g) / 2.4 g  · Avoid: Chicken, with skin, dark meat (3 oz/85 g) / 4 g  · Choose: Chicken, skin removed, dark meat (3 oz/85 g) / 2 g  · Avoid: Chicken, with skin, light meat (3 oz/85 g) / 2.5 g  · Choose: Chicken, skin removed, light meat (3 oz/85 g) / 1 g  Dairy / Saturated Fat (g)  · Avoid: Whole milk (1 cup) / 5 g  · Choose: Low-fat milk, 2% (1 cup) / 3 g  · Choose: Low-fat milk, 1% (1 cup) / 1.5 g  · Choose: Skim milk (1 cup) / 0.3 g  · Avoid: Hard cheese (1 oz/28 g) / 6 g  · Choose: Skim milk cheese (1 oz/28 g) / 2 to 3 g  · Avoid: Cottage cheese, 4% fat (1 cup) / 6.5 g  · Choose: Low-fat cottage cheese, 1% fat (1 cup) / 1.5 g  · Avoid: Ice cream (1 cup) / 9 g  · Choose: Sherbet (1 cup) / 2.5 g  · Choose: Nonfat frozen yogurt (1 cup) / 0.3 g  · Choose: Frozen fruit bar / trace  · Avoid: Whipped cream (1 tbs) / 3.5 g  · Choose: Nondairy whipped topping (1 tbs) / 1 g  Condiments / Saturated Fat (g)  · Avoid: Mayonnaise (1 tbs) / 2 g  · Choose: Low-fat mayonnaise (1 tbs) / 1 g  · Avoid: Butter (1 tbs) / 7 g  · Choose: Extra light margarine (1 tbs) / 1 g  · Avoid: Coconut oil (1 tbs) / 11.8 g  · Choose: Olive oil (1 tbs) / 1.8 g  · Choose: Corn oil (1 tbs) / 1.7 g  · Choose: Safflower oil (1 tbs) / 1.2 g  · Choose: Sunflower oil (1 tbs) / 1.4 g  · Choose: Soybean oil (1 tbs) / 2.4 g  · Choose: Canola oil (1 tbs) / 1 g  Document Released: 09/24/2005 Document Revised: 01/19/2013 Document Reviewed: 03/15/2011  [employer]® Patient Information ©2014 [employer].

## 2013-10-13 NOTE — Progress Notes (Signed)
   Subjective:    Patient ID: Alyssa Dudley, female    DOB: 23-Nov-1955, 58 y.o.   MRN: [id number]  HPI Comments: 58 yo female presents for 3 month F/U for Cholesterol, Pre-Dm, D. Deficient. She keeps busy for exercise occasional walking.   She is not eating meat x 1 month. She is eating mostly beans and cheese for protein.LAST LABS T 144 TG 46 H 51 L 84 INSULIN 13   Left ear with whistle nose on/ off. She notes mild PND. She denies cold SX.   Right knee with moveable little area for over 1 week with out any pain or history of trauma. She has not noticed swelling or difficulty with movement.   Current Outpatient Prescriptions on File Prior to Visit  Medication Sig Dispense Refill  . pravastatin (PRAVACHOL) 40 MG tablet Take 40 mg by mouth daily.        No current facility-administered medications on file prior to visit.   Review of patient's allergies indicates no known allergies.  Past Medical History  Diagnosis Date  . Second degree Mobitz II AV block      Review of Systems  HENT: Positive for ear pain and postnasal drip.   Musculoskeletal: Positive for joint swelling.  All other systems reviewed and are negative.    BP 122/78  Pulse 74  Temp(Src) 98.2 F (36.8 C) (Temporal)  Resp 18  Ht 5' 4.5" (1.638 m)  Wt 184 lb (83.462 kg)  BMI 31.11 kg/m2     Objective:   Physical Exam  Nursing note and vitals reviewed. Constitutional: She is oriented to person, place, and time. She appears well-developed and well-nourished. No distress.  HENT:  Head: Normocephalic and atraumatic.  Right Ear: External ear normal.  Left Ear: External ear normal.  Nose: Nose normal.  Mouth/Throat: Oropharynx is clear and moist. No oropharyngeal exudate.  Cloudy TMS  Eyes: Conjunctivae and EOM are normal.  Neck: Normal range of motion. Neck supple. No JVD present. No thyromegaly present.  Cardiovascular: Normal rate, regular rhythm, normal heart sounds and intact distal pulses.     Pulmonary/Chest: Effort normal and breath sounds normal.  Abdominal: Soft. Bowel sounds are normal. She exhibits no distension and no mass. There is no tenderness. There is no rebound and no guarding.  Musculoskeletal: Normal range of motion. She exhibits tenderness. She exhibits no edema.       Right knee: She exhibits swelling. She exhibits normal range of motion.       Legs: Lymphadenopathy:    She has no cervical adenopathy.  Neurological: She is alert and oriented to person, place, and time. No cranial nerve deficit.  Skin: Skin is warm and dry. No rash noted. No erythema. No pallor.  Psychiatric: She has a normal mood and affect. Her behavior is normal. Judgment and thought content normal.          Assessment & Plan:  1.  3 month F/U for Cholesterol, Pre-Dm, D. Deficient. Needs healthy diet, cardio QD and obtain healthy weight. Check Labs, Check BP if >130/80 call office. Add asa 81 2. Allergic rhinitis- Allegra OTC, increase H2o, allergy hygiene explained. May try sudafed x 2 days and OTC Nasacort w/c with results 3. Right knee ? Swelling and FB- get xray to evaluate, R.I.C.E, call with results

## 2013-10-14 LAB — BASIC METABOLIC PANEL WITH GFR
BUN: 11 mg/dL (ref 6–23)
CO2: 30 mEq/L (ref 19–32)
Calcium: 9.9 mg/dL (ref 8.4–10.5)
Chloride: 103 mEq/L (ref 96–112)
Creat: 0.65 mg/dL (ref 0.50–1.10)
GFR, Est African American: >89 mL/min
GFR, Est Non African American: >89 mL/min
Glucose, Bld: 83 mg/dL (ref 70–99)
Potassium: 4.9 mEq/L (ref 3.5–5.3)
Sodium: 142 mEq/L (ref 135–145)

## 2013-10-14 LAB — HEPATIC FUNCTION PANEL
ALT: 26 U/L (ref 0–35)
AST: 23 U/L (ref 0–37)
Albumin: 4.4 g/dL (ref 3.5–5.2)
Alkaline Phosphatase: 126 U/L — ABNORMAL HIGH (ref 39–117)
Bilirubin, Direct: 0.1 mg/dL (ref 0.0–0.3)
Indirect Bilirubin: 0.6 mg/dL (ref 0.0–0.9)
Total Bilirubin: 0.7 mg/dL (ref 0.3–1.2)
Total Protein: 7.7 g/dL (ref 6.0–8.3)

## 2013-10-14 LAB — LIPID PANEL
Cholesterol: 176 mg/dL (ref 0–200)
HDL: 57 mg/dL (ref >39)
LDL Cholesterol: 108 mg/dL — ABNORMAL HIGH (ref 0–99)
Total CHOL/HDL Ratio: 3.1 Ratio
Triglycerides: 57 mg/dL (ref <150)
VLDL: 11 mg/dL (ref 0–40)

## 2013-10-14 LAB — INSULIN, FASTING: Insulin fasting, serum: 10 u[IU]/mL (ref 3–28)

## 2013-12-09 ENCOUNTER — Ambulatory Visit (INDEPENDENT_AMBULATORY_CARE_PROVIDER_SITE_OTHER): Payer: BC Managed Care – PPO | Admitting: *Deleted

## 2013-12-09 ENCOUNTER — Encounter: Payer: Self-pay | Admitting: Internal Medicine

## 2013-12-09 DIAGNOSIS — I441 Atrioventricular block, second degree: Secondary | ICD-10-CM

## 2013-12-29 LAB — MDC_IDC_ENUM_SESS_TYPE_REMOTE
Battery Remaining Longevity: 144 mo
Battery Voltage: 3.01 V
Brady Statistic AP VP Percent: 27 %
Brady Statistic AP VS Percent: 1.9 %
Brady Statistic AS VP Percent: 68 %
Brady Statistic AS VS Percent: 3.4 %
Brady Statistic RA Percent Paced: 28 %
Brady Statistic RV Percent Paced: 95 %
Date Time Interrogation Session: 20150304090018
Implantable Pulse Generator Model: 2210
Implantable Pulse Generator Serial Number: 7457437
Lead Channel Impedance Value: 510 Ohm
Lead Channel Impedance Value: 660 Ohm
Lead Channel Pacing Threshold Amplitude: 0.5 V
Lead Channel Pacing Threshold Amplitude: 0.5 V
Lead Channel Pacing Threshold Pulse Width: 0.4 ms
Lead Channel Pacing Threshold Pulse Width: 0.4 ms
Lead Channel Sensing Intrinsic Amplitude: 12 mV
Lead Channel Sensing Intrinsic Amplitude: 4.9 mV
Lead Channel Setting Pacing Amplitude: 0.75 V
Lead Channel Setting Pacing Amplitude: 1.5 V
Lead Channel Setting Pacing Pulse Width: 0.4 ms
Lead Channel Setting Sensing Sensitivity: 2 mV

## 2014-01-15 ENCOUNTER — Encounter: Payer: Self-pay | Admitting: *Deleted

## 2014-02-20 DIAGNOSIS — E785 Hyperlipidemia, unspecified: Secondary | ICD-10-CM | POA: Insufficient documentation

## 2014-02-20 DIAGNOSIS — E559 Vitamin D deficiency, unspecified: Secondary | ICD-10-CM | POA: Insufficient documentation

## 2014-02-22 ENCOUNTER — Other Ambulatory Visit (HOSPITAL_COMMUNITY)
Admission: RE | Admit: 2014-02-22 | Discharge: 2014-02-22 | Disposition: A | Discharge status: Home / Self Care | Payer: BC Managed Care – PPO | Source: Ambulatory Visit | Attending: Internal Medicine | Admitting: Internal Medicine

## 2014-02-22 ENCOUNTER — Other Ambulatory Visit: Payer: Self-pay | Admitting: Emergency Medicine

## 2014-02-22 ENCOUNTER — Ambulatory Visit (INDEPENDENT_AMBULATORY_CARE_PROVIDER_SITE_OTHER): Payer: BC Managed Care – PPO | Admitting: Emergency Medicine

## 2014-02-22 ENCOUNTER — Encounter: Payer: Self-pay | Admitting: Emergency Medicine

## 2014-02-22 VITALS — BP 120/78 | HR 70 | Temp 98.6°F | Resp 18 | Wt 170.0 lb

## 2014-02-22 DIAGNOSIS — Z111 Encounter for screening for respiratory tuberculosis: Secondary | ICD-10-CM

## 2014-02-22 DIAGNOSIS — E782 Mixed hyperlipidemia: Secondary | ICD-10-CM

## 2014-02-22 DIAGNOSIS — Z01419 Encounter for gynecological examination (general) (routine) without abnormal findings: Secondary | ICD-10-CM | POA: Insufficient documentation

## 2014-02-22 DIAGNOSIS — E559 Vitamin D deficiency, unspecified: Secondary | ICD-10-CM

## 2014-02-22 DIAGNOSIS — Z0000 Encounter for general adult medical examination without abnormal findings: Secondary | ICD-10-CM

## 2014-02-22 DIAGNOSIS — Z1212 Encounter for screening for malignant neoplasm of rectum: Secondary | ICD-10-CM

## 2014-02-22 DIAGNOSIS — Z79899 Other long term (current) drug therapy: Secondary | ICD-10-CM

## 2014-02-22 DIAGNOSIS — N3000 Acute cystitis without hematuria: Secondary | ICD-10-CM

## 2014-02-22 DIAGNOSIS — R9431 Abnormal electrocardiogram [ECG] [EKG]: Secondary | ICD-10-CM

## 2014-02-22 DIAGNOSIS — I10 Essential (primary) hypertension: Secondary | ICD-10-CM

## 2014-02-22 DIAGNOSIS — Z124 Encounter for screening for malignant neoplasm of cervix: Secondary | ICD-10-CM

## 2014-02-22 LAB — CBC WITH DIFFERENTIAL/PLATELET
Basophils Absolute: 0 10*3/uL (ref 0.0–0.1)
Basophils Relative: 0 % (ref 0–1)
Eosinophils Absolute: 0.1 10*3/uL (ref 0.0–0.7)
Eosinophils Relative: 2 % (ref 0–5)
HCT: 42.5 % (ref 36.0–46.0)
Hemoglobin: 14.7 g/dL (ref 12.0–15.0)
Lymphocytes Relative: 39 % (ref 12–46)
Lymphs Abs: 2.3 10*3/uL (ref 0.7–4.0)
MCH: 30.4 pg (ref 26.0–34.0)
MCHC: 34.6 g/dL (ref 30.0–36.0)
MCV: 87.8 fL (ref 78.0–100.0)
Monocytes Absolute: 0.6 10*3/uL (ref 0.1–1.0)
Monocytes Relative: 11 % (ref 3–12)
Neutro Abs: 2.8 10*3/uL (ref 1.7–7.7)
Neutrophils Relative %: 48 % (ref 43–77)
Platelets: 261 10*3/uL (ref 150–400)
RBC: 4.84 MIL/uL (ref 3.87–5.11)
RDW: 14.3 % (ref 11.5–15.5)
WBC: 5.9 10*3/uL (ref 4.0–10.5)

## 2014-02-22 LAB — HEMOGLOBIN A1C
Hgb A1c MFr Bld: 5.6 % (ref <5.7)
Mean Plasma Glucose: 114 mg/dL (ref <117)

## 2014-02-22 NOTE — Patient Instructions (Signed)
Heart Attack in Women Heart attack (myocardial infarction) is one of the leading causes of sudden, unexpected death in women. A heart attack is damage to the heart that is not reversible. A heart attack usually occurs when a heart (coronary) artery becomes narrowed or blocked. The blockage cuts off blood supply to the heart muscle. When one or more of the coronary arteries becomes blocked, that area of the heart begins to die. This can cause pain felt during a heart attack.  If you think you might be having a heart attack, do not ignore your symptoms. Call your local emergency services (911 in [place] immediately. It is recommended that you take a 162 mg non-enteric coated aspirin if you do not have an aspirin allergy. Do not drive yourself to the hospital or wait to see if your symptoms go away. Early recognition of heart attack symptoms is critical. The sooner a heart attack is treated, the greater the amount of heart muscle saved. Time is muscle. It can save your life. CAUSES  A heart attack can occur from coronary artery disease (CAD). CAD is a process in which the coronary arteries narrow or become blocked from the development of atherosclerosis. Atherosclerosis is a disease in which plaque builds up on the inside of the coronary arteries. Plaque is made up of fats (lipids), cholesterol, calcium, and fibrous tissue. A heart attack can occur due to:  Plaque buildup that can severely narrow or block the coronary arteries and diminish blood flow.  Plaque that can become unstable and "rupture." Unstable plaque that ruptures within a coronary artery can form a clot and cause a sudden (acute) blockage of the coronary artery.  A severe tightening (spasm) of the coronary artery. This is a less common cause of a heart attack. When a coronary artery spasms, it cuts off blood flow through the coronary artery. Spasms can occur in coronary arteries that do not have atherosclerosis. RISK FACTORS In women, as the  level of estrogen in the blood decreases after menopause, the risk of a heart attack increases. Other risk factors of heart attack in women include:  High blood pressure.  High cholesterol levels.  Menopause.  Smoking.  Obesity.  Diabetes.  Hysterectomy.  Previous heart attack.  Lack of regular exercise.  Family history of heart attacks. SYMPTOMS  In women, heart attack symptoms may be different than those in men. Women may not experience the typical chest discomfort or pain, which is considered the primary heart attack symptom in men. Women may describe a feeling of pressure, ache, or tightness in the chest. Women may experience new or different physical symptoms 1 month or more before a heart attack. Unusual, unexplained fatigue may be the most frequently identified symptom. Sleep disturbances and weakness in the arms may also be considered warning signs.  Other heart attack symptoms that may occur more often in women are:  Unexplained feelings of nervousness or anxiety.  Discomfort between the shoulder blades.  Tingling in the hands and arms.  Swollen arms.  Headaches. Heart attack symptoms for both men and women include:  Pain or discomfort spreading to the neck, shoulder, arm, or jaw.  Shortness of breath.  Sudden cold sweats.  Pain or discomfort in the abdomen.  Heartburn or indigestion with or without vomiting.  Sudden lightheadedness.  Sudden fainting or blackout. PREVENTION The following healthy lifestyle habits may help decrease your risk of heart attacks:  Quitting smoking.  Keeping your blood pressure, blood sugar, and cholesterol levels within normal  limits.  Maintaining a healthy weight.  Staying physically active and exercising regularly.  Decreasing your salt intake.  Eating a diet low in saturated fats and cholesterol.  Increasing your fiber intake by including whole grains, vegetables, and fruits in your diet.  Avoiding situations  that cause stress, anger, or depression.  Taking medicine as advised by your caregiver. SEEK IMMEDIATE MEDICAL CARE IF:   You have severe chest pain, especially if the pain is crushing or pressure-like and spreads to the arms, back, neck, or jaw. This is an emergency. Do not wait to see if the pain will go away. Call your local emergency services (911 in [place] immediately. Do not drive yourself to the hospital.  You develop shortness of breath during rest, sleep, or with activity.  You have sudden, unexplained sweating or clammy skin.  You feel nauseous or vomit without cause.  You become lightheaded or dizzy.  You feel your heart beating rapidly or you notice your heart "skipping" beats. MAKE SURE YOU:   Understand these instructions.  Will watch your condition.  Will get help right away if you are not doing well or get worse. Document Released: 03/22/2008 Document Revised: 03/25/2012 Document Reviewed: 12/27/2011 [facility] Patient Information 2014 [place]. Pilonidal Cyst A pilonidal cyst occurs when hairs get trapped (ingrown) beneath the skin in the crease between the buttocks over your sacrum (the bone under that crease). Pilonidal cysts are most common in young men with a lot of body hair. When the cyst is ruptured (breaks) or leaking, fluid from the cyst may cause burning and itching. If the cyst becomes infected, it causes a painful swelling filled with pus (abscess). The pus and trapped hairs need to be removed (often by lancing) so that the infection can heal. However, recurrence is common and an operation may be needed to remove the cyst. HOME CARE INSTRUCTIONS   If the cyst was NOT INFECTED:  Keep the area clean and dry. Bathe or shower daily. Wash the area well with a germ-killing soap. Warm tub baths may help prevent infection and help with drainage. Dry the area well with a towel.  Avoid tight clothing to keep area as moisture free as possible.  Keep area  between buttocks as free of hair as possible. A depilatory may be used.  If the cyst WAS INFECTED and needed to be drained:  Your caregiver packed the wound with gauze to keep the wound open. This allows the wound to heal from the inside outwards and continue draining.  Return for a wound check in 1 day or as suggested.  If you take tub baths or showers, repack the wound with gauze following them. Sponge baths (at the sink) are a good alternative.  If an antibiotic was ordered to fight the infection, take as directed.  Only take over-the-counter or prescription medicines for pain, discomfort, or fever as directed by your caregiver.  After the drain is removed, use sitz baths for 20 minutes 4 times per day. Clean the wound gently with mild unscented soap, pat dry, and then apply a dry dressing. SEEK MEDICAL CARE IF:   You have increased pain, swelling, redness, drainage, or bleeding from the area.  You have a fever.  You have muscles aches, dizziness, or a general ill feeling. Document Released: 09/21/2000 Document Revised: 12/17/2011 Document Reviewed: 11/19/2008 [facility] Patient Information 2014 [place].

## 2014-02-22 NOTE — Progress Notes (Signed)
Subjective:    Patient ID: Alyssa Dudley, female    DOB: 15-Feb-1956, 58 y.o.   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 She has been feeling much better since pacer was inserted last year. She is eating better. She is down 14 #. She is walking at least 3 days a week. She feels good. She denies stress/ CP/ SOB/ Palpatations. She sees DR Allred in Aug for yearly f/u. Last Abnormal labs LDL 108, A1c 5.8 She has been doing well on Pravastatin w/o any concerns/ SE. She also takes ASA 81 mg.   She notes her mood is great with decreased stress with her 4 Daycare Businesses and with her taking better care of herself.  She  Notes lump comes and goes on back side on/ off x several months without trigger. She notes stings with shower when inflammed. She denies any drainage or fever associated with lump.   She notes her brother was recently diagnosed with prostate Cancer but is doing well.   She notes Q 6 month f/u with mammo and breast U/s for right? Cystic mass. She denies any change or symptoms. She notes last scan WNL.  Hyperlipidemia      Medication List       This list is accurate as of: 02/22/14  2:42 PM.  Always use your most recent med list.               aspirin EC 81 MG tablet  Take 81 mg by mouth daily.     pravastatin 40 MG tablet  Commonly known as:  PRAVACHOL  Take 40 mg by mouth daily.     VITAMIN D PO  Take 2,000 Int'l Units by mouth daily.       No Known Allergies  Past Medical History  Diagnosis Date  . Second degree Mobitz II AV block   . Hyperlipidemia   . Vitamin D deficiency    Past Surgical History  Procedure Laterality Date  . Breast cyst excision  1980  . Wrist surgery  2011    removed cyst  . Pacemaker insertion  Oct 01, 2057    SJM Accent DR RF implanted by Dr [name] for mobitz II AV block    History   Social History  . Marital Status: Single    Spouse Name: N/A     Number of Children: N/A  . Years of Education: N/A   Occupational History  . Not on file.   Social History Main Topics  . Smoking status: Never Smoker   . Smokeless tobacco: Never Used  . Alcohol Use: No  . Drug Use: No  . Sexual Activity: Not on file   Other Topics Concern  . Not on file   Social History Narrative  . No narrative on file   Family History  Problem Relation Age of Onset  . Colon cancer Sister   . Cancer Sister   . Cancer Mother     cervical  . Hypertension Mother   . Diabetes Mother   . Heart disease Father   . Hyperlipidemia Father   . Cancer Brother     prostate     MAINTENANCE: Colonoscopy:10/15/11 due 2018 with polyps and FHX Mammo: 05/12/13 BMD: 2013 WNL Pap/ Pelvic: 2013 WNL EYE: yearly WNL Dentist:Q 32month  IMMUNIZATIONS: Tdap:2013 Pneumovax n/a: Zostavax:n/a Influenza:2014  Patient Care Team: Unk Pinto, MD as PCP - General (Internal Medicine) Sable Feil,  MD as Consulting Physician (Gastroenterology) Camillo Flaming, OD as Referring Physician (Optometry) Fay Records, MD as Consulting Physician (Cardiology) Wynonia Sours, MD as Consulting Physician (Orthopedic Surgery)   Review of Systems BP 120/78  Pulse 70  Temp(Src) 98.6 F (37 C) (Temporal)  Resp 18  Wt 170 lb (77.111 kg)     Objective:   Physical Exam  Nursing note and vitals reviewed. Constitutional: She is oriented to person, place, and time. She appears well-developed and well-nourished. No distress.  HENT:  Head: Normocephalic and atraumatic.  Right Ear: External ear normal.  Left Ear: External ear normal.  Nose: Nose normal.  Mouth/Throat: Oropharynx is clear and moist.  Eyes: Conjunctivae and EOM are normal. Pupils are equal, round, and reactive to light. Right eye exhibits no discharge. Left eye exhibits no discharge. No scleral icterus.  Neck: Normal range of motion. Neck supple. No JVD present. No tracheal deviation present. No thyromegaly present.   Cardiovascular: Normal rate, regular rhythm, normal heart sounds and intact distal pulses.   Pulmonary/Chest: Effort normal and breath sounds normal.  Abdominal: Soft. Bowel sounds are normal. She exhibits no distension and no mass. There is no tenderness. There is no rebound and no guarding.  Genitourinary: Vagina normal and uterus normal.  Breasts- Fibrocystic ? pilonidal cyst at top of buttock crevice w/o erythema/ exudate  Musculoskeletal: Normal range of motion. She exhibits no edema and no tenderness.  Lymphadenopathy:    She has no cervical adenopathy.  Neurological: She is alert and oriented to person, place, and time. She has normal reflexes. No cranial nerve deficit. She exhibits normal muscle tone. Coordination normal.  Skin: Skin is warm and dry. No rash noted. No erythema. No pallor.  Psychiatric: She has a normal mood and affect. Her behavior is normal. Judgment and thought content normal.     AORTA SCAN WNL EKG with new change will send DR Allred for evaluation and f/u     Assessment & Plan:  1. CPE- Update screening labs/ History/ Immunizations/ Testing as needed. Advised healthy diet, QD exercise, increase H20 and continue RX/ Vitamins AD.  2. EKG Change- Will send for evaluation by Dr [name], advised any symptoms ER.  3. Elevated A1c/ Cholesterol- recheck labs, Need to eat healthier and exercise AD.   4. Pilonidal cyst- Advised will need GEN SUrg evaluation but declines currently.

## 2014-02-23 ENCOUNTER — Encounter: Payer: Self-pay | Admitting: Emergency Medicine

## 2014-02-23 LAB — URINALYSIS, ROUTINE W REFLEX MICROSCOPIC
Bilirubin Urine: NEGATIVE
Glucose, UA: NEGATIVE mg/dL
Hgb urine dipstick: NEGATIVE
Ketones, ur: NEGATIVE mg/dL
Nitrite: NEGATIVE
Protein, ur: NEGATIVE mg/dL
Specific Gravity, Urine: 1.022 (ref 1.005–1.030)
Urobilinogen, UA: 0.2 mg/dL (ref 0.0–1.0)
pH: 5 (ref 5.0–8.0)

## 2014-02-23 LAB — HEPATIC FUNCTION PANEL
ALT: 18 U/L (ref 0–35)
AST: 20 U/L (ref 0–37)
Albumin: 4.7 g/dL (ref 3.5–5.2)
Alkaline Phosphatase: 101 U/L (ref 39–117)
Bilirubin, Direct: 0.2 mg/dL (ref 0.0–0.3)
Indirect Bilirubin: 0.5 mg/dL (ref 0.2–1.2)
Total Bilirubin: 0.7 mg/dL (ref 0.2–1.2)
Total Protein: 7.9 g/dL (ref 6.0–8.3)

## 2014-02-23 LAB — LIPID PANEL
Cholesterol: 153 mg/dL (ref 0–200)
HDL: 52 mg/dL (ref >39)
LDL Cholesterol: 84 mg/dL (ref 0–99)
Total CHOL/HDL Ratio: 2.9 Ratio
Triglycerides: 83 mg/dL (ref <150)
VLDL: 17 mg/dL (ref 0–40)

## 2014-02-23 LAB — BASIC METABOLIC PANEL WITH GFR
BUN: 13 mg/dL (ref 6–23)
CO2: 29 mEq/L (ref 19–32)
Calcium: 10.3 mg/dL (ref 8.4–10.5)
Chloride: 106 mEq/L (ref 96–112)
Creat: 0.9 mg/dL (ref 0.50–1.10)
GFR, Est African American: 83 mL/min
GFR, Est Non African American: 72 mL/min
Glucose, Bld: 94 mg/dL (ref 70–99)
Potassium: 4.8 mEq/L (ref 3.5–5.3)
Sodium: 144 mEq/L (ref 135–145)

## 2014-02-23 LAB — URINALYSIS, MICROSCOPIC ONLY
Bacteria, UA: NONE SEEN
Casts: NONE SEEN
Crystals: NONE SEEN
Squamous Epithelial / LPF: NONE SEEN

## 2014-02-23 LAB — VITAMIN D 25 HYDROXY (VIT D DEFICIENCY, FRACTURES): Vit D, 25-Hydroxy: 34 ng/mL (ref 30–89)

## 2014-02-23 LAB — MICROALBUMIN / CREATININE URINE RATIO
Creatinine, Urine: 201.9 mg/dL
Microalb Creat Ratio: 9.4 mg/g (ref 0.0–30.0)
Microalb, Ur: 1.9 mg/dL — ABNORMAL HIGH (ref 0.00–1.89)

## 2014-02-23 LAB — INSULIN, FASTING: Insulin fasting, serum: 22 u[IU]/mL (ref 3–28)

## 2014-02-23 LAB — TSH: TSH: 2.318 u[IU]/mL (ref 0.350–4.500)

## 2014-02-23 LAB — MAGNESIUM: Magnesium: 2.1 mg/dL (ref 1.5–2.5)

## 2014-02-25 ENCOUNTER — Encounter: Payer: Self-pay | Admitting: *Deleted

## 2014-02-25 LAB — TB SKIN TEST
Induration: 0 mm
TB Skin Test: NEGATIVE

## 2014-02-25 LAB — URINE CULTURE: Colony Count: 4000

## 2014-03-08 ENCOUNTER — Other Ambulatory Visit (INDEPENDENT_AMBULATORY_CARE_PROVIDER_SITE_OTHER): Payer: BC Managed Care – PPO

## 2014-03-08 DIAGNOSIS — Z1212 Encounter for screening for malignant neoplasm of rectum: Secondary | ICD-10-CM

## 2014-03-08 DIAGNOSIS — Z0000 Encounter for general adult medical examination without abnormal findings: Secondary | ICD-10-CM

## 2014-03-08 DIAGNOSIS — I480 Paroxysmal atrial fibrillation: Secondary | ICD-10-CM

## 2014-03-08 HISTORY — DX: Paroxysmal atrial fibrillation: I48.0

## 2014-03-08 LAB — POC HEMOCCULT BLD/STL (HOME/3-CARD/SCREEN)
Card #2 Fecal Occult Blod, POC: NEGATIVE
Card #3 Fecal Occult Blood, POC: NEGATIVE
Fecal Occult Blood, POC: NEGATIVE

## 2014-03-15 ENCOUNTER — Encounter: Payer: Self-pay | Admitting: Internal Medicine

## 2014-03-15 ENCOUNTER — Ambulatory Visit (INDEPENDENT_AMBULATORY_CARE_PROVIDER_SITE_OTHER): Payer: BC Managed Care – PPO | Admitting: *Deleted

## 2014-03-15 DIAGNOSIS — I441 Atrioventricular block, second degree: Secondary | ICD-10-CM

## 2014-03-15 NOTE — Progress Notes (Signed)
Remote pacemaker transmission.   

## 2014-03-19 LAB — MDC_IDC_ENUM_SESS_TYPE_REMOTE
Battery Remaining Longevity: 133 mo
Battery Remaining Percentage: 95.5 %
Battery Voltage: 2.99 V
Brady Statistic AP VP Percent: 34 %
Brady Statistic AP VS Percent: 1.3 %
Brady Statistic AS VP Percent: 63 %
Brady Statistic AS VS Percent: 2.3 %
Brady Statistic RA Percent Paced: 34 %
Brady Statistic RV Percent Paced: 96 %
Date Time Interrogation Session: 20150608073905
Implantable Pulse Generator Model: 2210
Implantable Pulse Generator Serial Number: 7457437
Lead Channel Impedance Value: 480 Ohm
Lead Channel Impedance Value: 660 Ohm
Lead Channel Pacing Threshold Amplitude: 0.5 V
Lead Channel Pacing Threshold Amplitude: 0.625 V
Lead Channel Pacing Threshold Pulse Width: 0.4 ms
Lead Channel Pacing Threshold Pulse Width: 0.4 ms
Lead Channel Sensing Intrinsic Amplitude: 1.5 mV
Lead Channel Sensing Intrinsic Amplitude: 12 mV
Lead Channel Setting Pacing Amplitude: 0.875
Lead Channel Setting Pacing Amplitude: 1.5 V
Lead Channel Setting Pacing Pulse Width: 0.4 ms
Lead Channel Setting Sensing Sensitivity: 2 mV

## 2014-03-26 ENCOUNTER — Encounter: Payer: Self-pay | Admitting: Cardiology

## 2014-03-26 ENCOUNTER — Telehealth: Payer: Self-pay | Admitting: *Deleted

## 2014-03-26 NOTE — Telephone Encounter (Signed)
Informed patient of AF episode, what AF is, and why she does not require anticoagulation at this time. Patient voiced understanding. Plan to keep scheduled appt on 8-31 w/JA.

## 2014-06-07 ENCOUNTER — Encounter: Payer: Self-pay | Admitting: Internal Medicine

## 2014-06-07 ENCOUNTER — Ambulatory Visit (INDEPENDENT_AMBULATORY_CARE_PROVIDER_SITE_OTHER): Payer: BC Managed Care – PPO | Admitting: Internal Medicine

## 2014-06-07 VITALS — BP 132/88 | HR 66 | Ht 66.0 in | Wt 167.0 lb

## 2014-06-07 DIAGNOSIS — I4891 Unspecified atrial fibrillation: Secondary | ICD-10-CM | POA: Insufficient documentation

## 2014-06-07 DIAGNOSIS — I480 Paroxysmal atrial fibrillation: Secondary | ICD-10-CM | POA: Insufficient documentation

## 2014-06-07 DIAGNOSIS — I441 Atrioventricular block, second degree: Secondary | ICD-10-CM

## 2014-06-07 LAB — MDC_IDC_ENUM_SESS_TYPE_INCLINIC
Battery Remaining Longevity: 140.4 mo
Battery Voltage: 2.99 V
Brady Statistic RA Percent Paced: 39 %
Brady Statistic RV Percent Paced: 97 %
Date Time Interrogation Session: 20150831094901
Implantable Pulse Generator Model: 2210
Implantable Pulse Generator Serial Number: 7457437
Lead Channel Impedance Value: 512.5 Ohm
Lead Channel Impedance Value: 712.5 Ohm
Lead Channel Pacing Threshold Amplitude: 0.5 V
Lead Channel Pacing Threshold Amplitude: 0.5 V
Lead Channel Pacing Threshold Pulse Width: 0.4 ms
Lead Channel Pacing Threshold Pulse Width: 0.4 ms
Lead Channel Sensing Intrinsic Amplitude: 12 mV
Lead Channel Sensing Intrinsic Amplitude: 3.6 mV
Lead Channel Setting Pacing Amplitude: 0.75 V
Lead Channel Setting Pacing Amplitude: 1.5 V
Lead Channel Setting Pacing Pulse Width: 0.4 ms
Lead Channel Setting Sensing Sensitivity: 2 mV

## 2014-06-07 NOTE — Progress Notes (Signed)
PCP: [name], MD   Tonia Tappen is a 58 y.o. female who presents today for routine electrophysiology followup.  Since her last visit, the patient reports doing very well.  Her exercise tolerance has significantly improved.   She is now exercising without significant limitation. She ran a 5k recently.  She is very excited about running.  Today, she denies symptoms of palpitations, chest pain, shortness of breath,  lower extremity edema, dizziness, presyncope, or syncope.  The patient is otherwise without complaint today.   Past Medical History  Diagnosis Date  . Second degree Mobitz II AV block   . Hyperlipidemia   . Vitamin D deficiency   . Paroxysmal atrial fibrillation 6/15    detected on ppm interrogation   Past Surgical History  Procedure Laterality Date  . Breast cyst excision  1980  . Wrist surgery  2011    removed cyst  . Pacemaker insertion  01/13/13    SJM Accent DR RF implanted by Dr [name] for mobitz II AV block    Current Outpatient Prescriptions  Medication Sig Dispense Refill  . aspirin EC 81 MG tablet Take 81 mg by mouth daily.      . Cholecalciferol (VITAMIN D PO) Take 2,000 Int'l Units by mouth daily.      . pravastatin (PRAVACHOL) 40 MG tablet Take 40 mg by mouth daily.        No current facility-administered medications for this visit.    Physical Exam: Filed Vitals:   06/07/14 0927  BP: 132/88  Pulse: 66  Height: 5\' 6"  (1.676 m)  Weight: 167 lb (75.751 kg)    GEN- The patient is well appearing, alert and oriented x 3 today.   Head- normocephalic, atraumatic Eyes-  Sclera clear, conjunctiva pink Ears- hearing intact Oropharynx- clear Lungs- Clear to ausculation bilaterally, normal work of breathing Chest- pacemaker pocket is well healed Heart- Regular rate and rhythm, no murmurs, rubs or gallops, PMI not laterally displaced GI- soft, NT, ND, + BS Extremities- no clubbing, cyanosis, or edema  Pacemaker interrogation- reviewed in  detail today,  See PACEART report  Assessment and Plan:  1. Mobitz II AV block Normal pacemaker function See Pace Art report No changes today  2. Paroxysmal afib Detected on PPM interrogated chads2vasc score is only 1.  I therefore have not started anticoagulation today  Merlin Return in 12 months

## 2014-06-07 NOTE — Patient Instructions (Signed)
Your physician wants you to follow-up in: 12 months with Dr Allred. You will receive a reminder letter in the mail two months in advance. If you don't receive a letter, please call our office to schedule the follow-up appointment.    Remote monitoring is used to monitor your Pacemaker of ICD from home. This monitoring reduces the number of office visits required to check your device to one time per year. It allows us to keep an eye on the functioning of your device to ensure it is working properly. You are scheduled for a device check from home on 09/08/14. You may send your transmission at any time that day. If you have a wireless device, the transmission will be sent automatically. After your physician reviews your transmission, you will receive a postcard with your next transmission date.   

## 2014-06-15 ENCOUNTER — Encounter: Payer: Self-pay | Admitting: Internal Medicine

## 2014-06-29 ENCOUNTER — Ambulatory Visit: Payer: Self-pay | Admitting: Physician Assistant

## 2014-06-30 ENCOUNTER — Ambulatory Visit: Payer: Self-pay | Admitting: Emergency Medicine

## 2014-07-20 ENCOUNTER — Ambulatory Visit (INDEPENDENT_AMBULATORY_CARE_PROVIDER_SITE_OTHER): Payer: BC Managed Care – PPO | Admitting: *Deleted

## 2014-07-20 DIAGNOSIS — Z23 Encounter for immunization: Secondary | ICD-10-CM

## 2014-07-21 ENCOUNTER — Ambulatory Visit: Payer: Self-pay | Admitting: Emergency Medicine

## 2014-08-17 ENCOUNTER — Ambulatory Visit (INDEPENDENT_AMBULATORY_CARE_PROVIDER_SITE_OTHER): Payer: BC Managed Care – PPO | Admitting: Internal Medicine

## 2014-08-17 ENCOUNTER — Encounter: Payer: Self-pay | Admitting: Internal Medicine

## 2014-08-17 VITALS — BP 108/66 | HR 68 | Temp 97.5°F | Resp 16 | Ht 64.5 in | Wt 171.8 lb

## 2014-08-17 DIAGNOSIS — R7309 Other abnormal glucose: Secondary | ICD-10-CM

## 2014-08-17 DIAGNOSIS — E785 Hyperlipidemia, unspecified: Secondary | ICD-10-CM

## 2014-08-17 DIAGNOSIS — E559 Vitamin D deficiency, unspecified: Secondary | ICD-10-CM

## 2014-08-17 DIAGNOSIS — I10 Essential (primary) hypertension: Secondary | ICD-10-CM

## 2014-08-17 DIAGNOSIS — Z79899 Other long term (current) drug therapy: Secondary | ICD-10-CM

## 2014-08-17 DIAGNOSIS — R7303 Prediabetes: Secondary | ICD-10-CM | POA: Insufficient documentation

## 2014-08-17 LAB — CBC WITH DIFFERENTIAL/PLATELET
Basophils Absolute: 0 10*3/uL (ref 0.0–0.1)
Basophils Relative: 0 % (ref 0–1)
Eosinophils Absolute: 0.1 10*3/uL (ref 0.0–0.7)
Eosinophils Relative: 2 % (ref 0–5)
HCT: 41.6 % (ref 36.0–46.0)
Hemoglobin: 14 g/dL (ref 12.0–15.0)
Lymphocytes Relative: 52 % — ABNORMAL HIGH (ref 12–46)
Lymphs Abs: 3.3 10*3/uL (ref 0.7–4.0)
MCH: 30.6 pg (ref 26.0–34.0)
MCHC: 33.7 g/dL (ref 30.0–36.0)
MCV: 90.8 fL (ref 78.0–100.0)
Monocytes Absolute: 0.5 10*3/uL (ref 0.1–1.0)
Monocytes Relative: 8 % (ref 3–12)
Neutro Abs: 2.4 10*3/uL (ref 1.7–7.7)
Neutrophils Relative %: 38 % — ABNORMAL LOW (ref 43–77)
Platelets: 313 10*3/uL (ref 150–400)
RBC: 4.58 MIL/uL (ref 3.87–5.11)
RDW: 13.9 % (ref 11.5–15.5)
WBC: 6.3 10*3/uL (ref 4.0–10.5)

## 2014-08-17 NOTE — Patient Instructions (Signed)

## 2014-08-17 NOTE — Progress Notes (Signed)
Patient ID: Alyssa Dudley, female   DOB: [birth year], 58 y.o.   MRN: [id number]   This very nice 58 y.o.DWF presents for 3 month follow up with Hypertension, Hyperlipidemia, Pre-Diabetes and Vitamin D Deficiency.    Patient has hx/o labile HTN and is monitored expectantly.  BP has been controlled and today's BP: 108/66 mmHg. Patient has had no complaints of any cardiac type chest pain, palpitations, dyspnea/orthopnea/PND, dizziness, claudication, or dependent edema.   Hyperlipidemia has been controlled with diet & meds, but apparently she stopped her pravastatin about a month ago for fear & concern that she might develop SE's if she continued on the medicine. Patient denied any myalgias or other med SE's. Last Lipids were at goal -   Total Chol 153; HDL 52; LDL  84; Trig 83 on  02/22/2014.   Also, the patient has history of PreDiabetes with A1c 5.8% in Jan 2015 and has had no symptoms of reactive hypoglycemia, diabetic polys, paresthesias or visual blurring.  Last A1c was 5.6% on 02/22/2014.   Further, the patient also has history of Vitamin D Deficiency and supplements vitamin D without any suspected side-effects. Last vitamin D was  34 on 02/22/2014.   Medication List   penicillin v potassium 500 MG tablet  Commonly known as:  VEETID     pravastatin 40 MG tablet    --------------- OFF med x 1 month !  Commonly known as:  PRAVACHOL  Take 40 mg by mouth daily.     VITAMIN D PO  Take 2,000 Int'l Units by mouth daily.     No Known Allergies  PMHx:   Past Medical History  Diagnosis Date  . Second degree Mobitz II AV block   . Hyperlipidemia   . Vitamin D deficiency   . Paroxysmal atrial fibrillation 6/15    detected on ppm interrogation   Immunization History  Administered Date(s) Administered  . Influenza Split 06/30/2013, 07/20/2014  . PPD Test 02/22/2014  . Tdap 10/09/2011   Past Surgical History  Procedure Laterality Date  . Breast cyst excision  1980  . Wrist surgery  2011     removed cyst  . Pacemaker insertion  01/13/13    SJM Accent DR RF implanted by Dr [name] for mobitz II AV block   FHx:    Reviewed / unchanged  SHx:    Reviewed / unchanged  Systems Review:  Constitutional: Denies fever, chills, wt changes, headaches, insomnia, fatigue, night sweats, change in appetite. Eyes: Denies redness, blurred vision, diplopia, discharge, itchy, watery eyes.  ENT: Denies discharge, congestion, post nasal drip, epistaxis, sore throat, earache, hearing loss, dental pain, tinnitus, vertigo, sinus pain, snoring.  CV: Denies chest pain, palpitations, irregular heartbeat, syncope, dyspnea, diaphoresis, orthopnea, PND, claudication or edema. Respiratory: denies cough, dyspnea, DOE, pleurisy, hoarseness, laryngitis, wheezing.  Gastrointestinal: Denies dysphagia, odynophagia, heartburn, reflux, water brash, abdominal pain or cramps, nausea, vomiting, bloating, diarrhea, constipation, hematemesis, melena, hematochezia  or hemorrhoids. Genitourinary: Denies dysuria, frequency, urgency, nocturia, hesitancy, discharge, hematuria or flank pain. Musculoskeletal: Denies arthralgias, myalgias, stiffness, jt. swelling, pain, limping or strain/sprain.  Skin: Denies pruritus, rash, hives, warts, acne, eczema or change in skin lesion(s). Neuro: No weakness, tremor, incoordination, spasms, paresthesia or pain. Psychiatric: Denies confusion, memory loss or sensory loss. Endo: Denies change in weight, skin or hair change.  Heme/Lymph: No excessive bleeding, bruising or enlarged lymph nodes.  Exam:  BP 108/66   Pulse 68  Temp 97.5 F   Resp 16  Ht 5' 4.5"  Wt 171 lb 12.8 oz   BMI 29.04  Appears well nourished and in no distress. Eyes: PERRLA, EOMs, conjunctiva no swelling or erythema. Sinuses: No frontal/maxillary tenderness ENT/Mouth: EAC's clear, TM's nl w/o erythema, bulging. Nares clear w/o erythema, swelling, exudates. Oropharynx clear without erythema or exudates. Oral  hygiene is good. Tongue normal, non obstructing. Hearing intact.  Neck: Supple. Thyroid nl. Car 2+/2+ without bruits, nodes or JVD. Chest: Respirations nl with BS clear & equal w/o rales, rhonchi, wheezing or stridor.  Cor: Heart sounds normal w/ regular rate and rhythm without sig. murmurs, gallops, clicks, or rubs. Peripheral pulses normal and equal  without edema.  Abdomen: Soft & bowel sounds normal. Non-tender w/o guarding, rebound, hernias, masses, or organomegaly.  Lymphatics: Unremarkable.  Musculoskeletal: Full ROM all peripheral extremities, joint stability, 5/5 strength, and normal gait.  Skin: Warm, dry without exposed rashes, lesions or ecchymosis apparent.  Neuro: Cranial nerves intact, reflexes equal bilaterally. Sensory-motor testing grossly intact. Tendon reflexes grossly intact.  Pysch: Alert & oriented x 3.  Insight and judgement nl & appropriate. No ideations.  Assessment and Plan:  1. Hypertension - Continue monitor blood pressure at home. Continue diet/meds same.  2. Hyperlipidemia - Continue diet, exercise,& lifestyle modifications. Continue monitor periodic cholesterol/liver & renal functions. Patient was apprised of the importance of controlling cholesterol.   3.  Pre-Diabetes - Continue diet, exercise, lifestyle modifications. Monitor appropriate labs.  4. Vitamin D Deficiency - Continue supplementation.   Recommended regular exercise, BP monitoring, weight control, and discussed med and SE's. Recommended labs to assess and monitor clinical status. Further disposition pending results of labs.

## 2014-08-18 LAB — TSH: TSH: 2.363 u[IU]/mL (ref 0.350–4.500)

## 2014-08-18 LAB — LIPID PANEL
Cholesterol: 172 mg/dL (ref 0–200)
HDL: 49 mg/dL (ref >39)
LDL Cholesterol: 101 mg/dL — ABNORMAL HIGH (ref 0–99)
Total CHOL/HDL Ratio: 3.5 Ratio
Triglycerides: 112 mg/dL (ref <150)
VLDL: 22 mg/dL (ref 0–40)

## 2014-08-18 LAB — HEPATIC FUNCTION PANEL
ALT: 18 U/L (ref 0–35)
AST: 17 U/L (ref 0–37)
Albumin: 4 g/dL (ref 3.5–5.2)
Alkaline Phosphatase: 103 U/L (ref 39–117)
Bilirubin, Direct: 0.1 mg/dL (ref 0.0–0.3)
Indirect Bilirubin: 0.2 mg/dL (ref 0.2–1.2)
Total Bilirubin: 0.3 mg/dL (ref 0.2–1.2)
Total Protein: 7.3 g/dL (ref 6.0–8.3)

## 2014-08-18 LAB — BASIC METABOLIC PANEL WITH GFR
BUN: 14 mg/dL (ref 6–23)
CO2: 28 mEq/L (ref 19–32)
Calcium: 9.6 mg/dL (ref 8.4–10.5)
Chloride: 104 mEq/L (ref 96–112)
Creat: 0.68 mg/dL (ref 0.50–1.10)
GFR, Est African American: >89 mL/min
GFR, Est Non African American: >89 mL/min
Glucose, Bld: 96 mg/dL (ref 70–99)
Potassium: 4.4 mEq/L (ref 3.5–5.3)
Sodium: 142 mEq/L (ref 135–145)

## 2014-08-18 LAB — HEMOGLOBIN A1C
Hgb A1c MFr Bld: 5.7 % — ABNORMAL HIGH (ref <5.7)
Mean Plasma Glucose: 117 mg/dL — ABNORMAL HIGH (ref <117)

## 2014-08-18 LAB — VITAMIN D 25 HYDROXY (VIT D DEFICIENCY, FRACTURES): Vit D, 25-Hydroxy: 27 ng/mL — ABNORMAL LOW (ref 30–89)

## 2014-08-18 LAB — MAGNESIUM: Magnesium: 1.9 mg/dL (ref 1.5–2.5)

## 2014-08-18 LAB — INSULIN, FASTING: Insulin fasting, serum: 61 u[IU]/mL — ABNORMAL HIGH (ref 2.0–19.6)

## 2014-08-28 ENCOUNTER — Encounter: Payer: Self-pay | Admitting: *Deleted

## 2014-09-08 ENCOUNTER — Encounter: Payer: Self-pay | Admitting: Internal Medicine

## 2014-09-08 ENCOUNTER — Ambulatory Visit (INDEPENDENT_AMBULATORY_CARE_PROVIDER_SITE_OTHER): Payer: BC Managed Care – PPO | Admitting: *Deleted

## 2014-09-08 DIAGNOSIS — I441 Atrioventricular block, second degree: Secondary | ICD-10-CM

## 2014-09-08 NOTE — Progress Notes (Signed)
Remote pacemaker transmission.   

## 2014-09-16 ENCOUNTER — Encounter (HOSPITAL_COMMUNITY): Payer: Self-pay | Admitting: Internal Medicine

## 2014-09-17 LAB — MDC_IDC_ENUM_SESS_TYPE_REMOTE
Battery Remaining Longevity: 137 mo
Battery Remaining Percentage: 95.5 %
Battery Voltage: 2.99 V
Brady Statistic AP VP Percent: 46 %
Brady Statistic AP VS Percent: 1 %
Brady Statistic AS VP Percent: 54 %
Brady Statistic AS VS Percent: 1 %
Brady Statistic RA Percent Paced: 45 %
Brady Statistic RV Percent Paced: 99 %
Date Time Interrogation Session: 20151202103802
Implantable Pulse Generator Model: 2210
Implantable Pulse Generator Serial Number: 7457437
Lead Channel Impedance Value: 510 Ohm
Lead Channel Impedance Value: 680 Ohm
Lead Channel Pacing Threshold Amplitude: 0.5 V
Lead Channel Pacing Threshold Amplitude: 0.5 V
Lead Channel Pacing Threshold Pulse Width: 0.4 ms
Lead Channel Pacing Threshold Pulse Width: 0.4 ms
Lead Channel Sensing Intrinsic Amplitude: 3.2 mV
Lead Channel Setting Pacing Amplitude: 0.75 V
Lead Channel Setting Pacing Amplitude: 1.5 V
Lead Channel Setting Pacing Pulse Width: 0.4 ms
Lead Channel Setting Sensing Sensitivity: 2 mV

## 2014-09-27 ENCOUNTER — Encounter: Payer: Self-pay | Admitting: Cardiology

## 2014-10-12 ENCOUNTER — Encounter: Payer: Self-pay | Admitting: Cardiology

## 2014-11-25 ENCOUNTER — Encounter: Payer: Self-pay | Admitting: Physician Assistant

## 2014-11-25 ENCOUNTER — Ambulatory Visit (INDEPENDENT_AMBULATORY_CARE_PROVIDER_SITE_OTHER): Payer: BLUE CROSS/BLUE SHIELD | Admitting: Physician Assistant

## 2014-11-25 VITALS — BP 128/78 | HR 64 | Temp 97.7°F | Resp 16 | Ht 64.5 in | Wt 177.0 lb

## 2014-11-25 DIAGNOSIS — R7309 Other abnormal glucose: Secondary | ICD-10-CM

## 2014-11-25 DIAGNOSIS — Z79899 Other long term (current) drug therapy: Secondary | ICD-10-CM

## 2014-11-25 DIAGNOSIS — E559 Vitamin D deficiency, unspecified: Secondary | ICD-10-CM

## 2014-11-25 DIAGNOSIS — E785 Hyperlipidemia, unspecified: Secondary | ICD-10-CM

## 2014-11-25 DIAGNOSIS — I10 Essential (primary) hypertension: Secondary | ICD-10-CM

## 2014-11-25 MED ORDER — VITAMIN D (ERGOCALCIFEROL) 1.25 MG (50000 UNIT) PO CAPS: 50000.0000 [IU] | ORAL_CAPSULE | ORAL | Status: DC

## 2014-11-25 NOTE — Progress Notes (Signed)
Assessment and Plan:  Hypertension: Continue medication, monitor blood pressure at home. Continue DASH diet.  Reminder to go to the ER if any CP, SOB, nausea, dizziness, severe HA, changes vision/speech, left arm numbness and tingling, and jaw pain. Cholesterol: Continue diet and exercise. Check cholesterol.  Pre-diabetes-Continue diet and exercise. Check A1C Vitamin D Def- check level and will send in RX vitamin D.   Continue diet and meds as discussed. Further disposition pending results of labs.  HPI 59 y.o. female  presents for 3 month follow up with hypertension, hyperlipidemia, prediabetes and vitamin D.  Her blood pressure has been controlled at home, today their BP is BP: 128/78 mmHg  She does not workout, since it is cold, she normally walks 3-5 miles. She denies chest pain, shortness of breath, dizziness.  She is suppose to be on cholesterol medication but did not have a refill so she has been off it for at least 2 months. Her cholesterol is at goal. The cholesterol last visit was:   Lab Results  Component Value Date   CHOL 172 08/17/2014   HDL 49 08/17/2014   LDLCALC 101* 08/17/2014   TRIG 112 08/17/2014   CHOLHDL 3.5 08/17/2014    She has been working on diet and exercise for prediabetes, and denies paresthesia of the feet, polydipsia and polyuria. Last A1C in the office was:  Lab Results  Component Value Date   HGBA1C 5.7* 08/17/2014   Patient is on Vitamin D supplement but has not taken any this month.   Lab Results  Component Value Date   VD25OH 27* 08/17/2014     She has Afib and mobitz type 2, follows with Dr. [name] and Leonides Schanz, low risk and not on anticoagulation.  BMI is Body mass index is 29.92 kg/(m^2)., she is working on diet and exercise. Wt Readings from Last 3 Encounters:  11/25/14 177 lb (80.287 kg)  08/17/14 171 lb 12.8 oz (77.928 kg)  06/07/14 167 lb (75.751 kg)     Current Medications:  Current Outpatient Prescriptions on File Prior to Visit   Medication Sig Dispense Refill  . aspirin EC 81 MG tablet Take 81 mg by mouth daily.    . Cholecalciferol (VITAMIN D PO) Take 2,000 Int'l Units by mouth daily.    . penicillin v potassium (VEETID) 500 MG tablet   1  . pravastatin (PRAVACHOL) 40 MG tablet Take 40 mg by mouth daily.      No current facility-administered medications on file prior to visit.   Medical History:  Past Medical History  Diagnosis Date  . Second degree Mobitz II AV block   . Hyperlipidemia   . Vitamin D deficiency   . Paroxysmal atrial fibrillation 6/15    detected on ppm interrogation   Allergies: No Known Allergies   Review of Systems:  Review of Systems  Constitutional: Negative.   HENT: Negative.   Eyes: Negative.   Respiratory: Negative.   Cardiovascular: Negative.   Gastrointestinal: Negative.   Genitourinary: Negative.   Musculoskeletal: Negative.   Skin: Negative.   Neurological: Negative.   Endo/Heme/Allergies: Negative.   Psychiatric/Behavioral: Negative.     Family history- Review and unchanged Social history- Review and unchanged Physical Exam: BP 128/78 mmHg  Pulse 64  Temp(Src) 97.7 F (36.5 C)  Resp 16  Ht 5' 4.5" (1.638 m)  Wt 177 lb (80.287 kg)  BMI 29.92 kg/m2 Wt Readings from Last 3 Encounters:  11/25/14 177 lb (80.287 kg)  08/17/14 171 lb 12.8 oz (77.928  kg)  06/07/14 167 lb (75.751 kg)   General Appearance: Well nourished, in no apparent distress. Eyes: PERRLA, EOMs, conjunctiva no swelling or erythema Sinuses: No Frontal/maxillary tenderness ENT/Mouth: Ext aud canals clear, TMs without erythema, bulging. No erythema, swelling, or exudate on post pharynx.  Tonsils not swollen or erythematous. Hearing normal.  Neck: Supple, thyroid normal.  Respiratory: Respiratory effort normal, BS equal bilaterally without rales, rhonchi, wheezing or stridor.  Cardio: RRR with no MRGs. Brisk peripheral pulses without edema.  Abdomen: Soft, + BS,  Non tender, no guarding,  rebound, hernias, masses. Lymphatics: Non tender without lymphadenopathy.  Musculoskeletal: Full ROM, 5/5 strength, Normal gait Skin: Warm, dry without rashes, lesions, ecchymosis.  Neuro: Cranial nerves intact. Normal muscle tone, no cerebellar symptoms. Psych: Awake and oriented X 3, normal affect, Insight and Judgment appropriate.    Vicie Mutters, PA-C 10:13 AM [facility]

## 2014-11-25 NOTE — Patient Instructions (Signed)
Before you even begin to attack a weight-loss plan, it pays to remember this: You are not fat. You have fat. Losing weight isn't about blame or shame; it's simply another achievement to accomplish. Dieting is like any other skill-you have to buckle down and work at it. As long as you act in a smart, reasonable way, you'll ultimately get where you want to be. Here are some weight loss pearls for you.  1. It's Not a Diet. It's a Lifestyle Thinking of a diet as something you're on and suffering through only for the short term doesn't work. To shed weight and keep it off, you need to make permanent changes to the way you eat. It's OK to indulge occasionally, of course, but if you cut calories temporarily and then revert to your old way of eating, you'll gain back the weight quicker than you can say yo-yo. Use it to lose it. Research shows that one of the best predictors of long-term weight loss is how many pounds you drop in the first month. For that reason, nutritionists often suggest being stricter for the first two weeks of your new eating strategy to build momentum. Cut out added sugar and alcohol and avoid unrefined carbs. After that, figure out how you can reincorporate them in a way that's healthy and maintainable.  2. There's a Right Way to Exercise Working out burns calories and fat and boosts your metabolism by building muscle. But those trying to lose weight are notorious for overestimating the number of calories they burn and underestimating the amount they take in. Unfortunately, your system is biologically programmed to hold on to extra pounds and that means when you start exercising, your body senses the deficit and ramps up its hunger signals. If you're not diligent, you'll eat everything you burn and then some. Use it to lose it. Cardio gets all the exercise glory, but strength and interval training are the real heroes. They help you build lean muscle, which in turn increases your metabolism and  calorie-burning ability 3. Don't Overreact to Mild Hunger Some people have a hard time losing weight because of hunger anxiety. To them, being hungry is bad-something to be avoided at all costs-so they carry snacks with them and eat when they don't need to. Others eat because they're stressed out or bored. While you never want to get to the point of being ravenous (that's when bingeing is likely to happen), a hunger pang, a craving, or the fact that it's 3:00 p.m. should not send you racing for the vending machine or obsessing about the energy bar in your purse. Ideally, you should put off eating until your stomach is growling and it's difficult to concentrate.  Use it to lose it. When you feel the urge to eat, use the HALT method. Ask yourself, Am I really hungry? Or am I angry or anxious, lonely or bored, or tired? If you're still not certain, try the apple test. If you're truly hungry, an apple should seem delicious; if it doesn't, something else is going on. Or you can try drinking water and making yourself busy, if you are still hungry try a healthy snack.  4. Not All Calories Are Created Equal The mechanics of weight loss are pretty simple: Take in fewer calories than you use for energy. But the kind of food you eat makes all the difference. Processed food that's high in saturated fat and refined starch or sugar can cause inflammation that disrupts the hormone signals that tell  your brain you're full. The result: You eat a lot more.  Use it to lose it. Clean up your diet. Swap in whole, unprocessed foods, including vegetables, lean protein, and healthy fats that will fill you up and give you the biggest nutritional bang for your calorie buck. In a few weeks, as your brain starts receiving regular hunger and fullness signals once again, you'll notice that you feel less hungry overall and naturally start cutting back on the amount you eat.  5. Protein, Produce, and Plant-Based Fats Are Your Weight-Loss  [place] Here's why eating the three Ps regularly will help you drop pounds. Protein fills you up. You need it to build lean muscle, which keeps your metabolism humming so that you can torch more fat. People in a weight-loss program who ate double the recommended daily allowance for protein (about 110 grams for a 150-pound woman) lost 70 percent of their weight from fat, while people who ate the RDA lost only about 40 percent, one study found. Produce is packed with filling fiber. "It's very difficult to consume too many calories if you're eating a lot of vegetables. Example: Three cups of broccoli is a lot of food, yet only 93 calories. (Fruit is another story. It can be easy to overeat and can contain a lot of calories from sugar, so be sure to monitor your intake.) Plant-based fats like olive oil and those in avocados and nuts are healthy and extra satiating.  Use it to lose it. Aim to incorporate each of the three Ps into every meal and snack. People who eat protein throughout the day are able to keep weight off, according to a study in the American Journal of Clinical Nutrition. In addition to meat, poultry and seafood, good sources are beans, lentils, eggs, tofu, and yogurt. As for fat, keep portion sizes in check by measuring out salad dressing, oil, and nut butters (shoot for one to two tablespoons). Finally, eat veggies or a little fruit at every meal. People who did that consumed 308 fewer calories but didn't feel any hungrier than when they didn't eat more produce.  7. How You Eat Is As Important As What You Eat In order for your brain to register that you're full, you need to focus on what you're eating. Sit down whenever you eat, preferably at a table. Turn off the TV or computer, put down your phone, and look at your food. Smell it. Chew slowly, and don't put another bite on your fork until you swallow. When women ate lunch this attentively, they consumed 30 percent less when snacking later than  those who listened to an audiobook at lunchtime, according to a study in the British Journal of Nutrition. 8. Weighing Yourself Really Works The scale provides the best evidence about whether your efforts are paying off. Seeing the numbers tick up or down or stagnate is motivation to keep going-or to rethink your approach. A 2015 study at [employer] found that daily weigh-ins helped people lose more weight, keep it off, and maintain that loss, even after two years. Use it to lose it. Step on the scale at the same time every day for the best results. If your weight shoots up several pounds from one weigh-in to the next, don't freak out. Eating a lot of salt the night before or having your period is the likely culprit. The number should return to normal in a day or two. It's a steady climb that you need to do something about.   9. Too Much Stress and Too Little Sleep Are Your Enemies When you're tired and frazzled, your body cranks up the production of cortisol, the stress hormone that can cause carb cravings. Not getting enough sleep also boosts your levels of ghrelin, a hormone associated with hunger, while suppressing leptin, a hormone that signals fullness and satiety. People on a diet who slept only five and a half hours a night for two weeks lost 55 percent less fat and were hungrier than those who slept eight and a half hours, according to a study in the [employer] Journal. Use it to lose it. Prioritize sleep, aiming for seven hours or more a night, which research shows helps lower stress. And make sure you're getting quality zzz's. If a snoring spouse or a fidgety cat wakes you up frequently throughout the night, you may end up getting the equivalent of just four hours of sleep, according to a study from [employer]. Keep pets out of the bedroom, and use a white-noise app to drown out snoring. 10. You Will Hit a plateau-And You Can Bust Through It As you slim down, your  body releases much less leptin, the fullness hormone.  If you're not strength training, start right now. Building muscle can raise your metabolism to help you overcome a plateau. To keep your body challenged and burning calories, incorporate new moves and more intense intervals into your workouts or add another sweat session to your weekly routine. Alternatively, cut an extra 100 calories or so a day from your diet. Now that you've lost weight, your body simply doesn't need as much fuel.   Ways to cut 100 calories  1. Eat your eggs with hot sauce OR salsa instead of cheese.  Eggs are great for breakfast, but many people consider eggs and cheese to be BFFs. Instead of cheese-1 oz. of cheddar has 114 calories-top your eggs with hot sauce, which contains no calories and helps with satiety and metabolism. Salsa is also a great option!!  2. Top your toast, waffles or pancakes with mashed berries instead of jelly or syrup. Half a cup of berries-fresh, frozen or thawed-has about 40 calories, compared with 2 tbsp. of maple syrup or jelly, which both have about 100 calories. The berries will also give you a good punch of fiber, which helps keep you full and satisfied and won't spike blood sugar quickly like the jelly or syrup. 3. Swap the non-fat latte for black coffee with a splash of half-and-half. Contrary to its name, that non-fat latte has 130 calories and a startling 19g of carbohydrates per 16 oz. serving. Replacing that 'light' drinkable dessert with a black coffee with a splash of half-and-half saves you more than 100 calories per 16 oz. serving. 4. Sprinkle salads with freeze-dried raspberries instead of dried cranberries. If you want a sweet addition to your nutritious salad, stay away from dried cranberries. They have a whopping 130 calories per  cup and 30g carbohydrates. Instead, sprinkle freeze-dried raspberries guilt-free and save more than 100 calories per  cup serving, adding 3g of belly-filling  fiber. 5. Go for mustard in place of mayo on your sandwich. Mustard can add really nice flavor to any sandwich, and there are tons of varieties, from spicy to honey. A serving of mayo is 95 calories, versus 10 calories in a serving of mustard. 6. Choose a DIY salad dressing instead of the store-bought kind. Mix Dijon or whole grain mustard with low-fat Kefir or red wine vinegar   and garlic. 7. Use hummus as a spread instead of a dip. Use hummus as a spread on a high-fiber cracker or tortilla with a sandwich and save on calories without sacrificing taste. 8. Pick just one salad "accessory." Salad isn't automatically a calorie winner. It's easy to over-accessorize with toppings. Instead of topping your salad with nuts, avocado and cranberries (all three will clock in at 313 calories), just pick one. The next day, choose a different accessory, which will also keep your salad interesting. You don't wear all your jewelry every day, right? 9. Ditch the white pasta in favor of spaghetti squash. One cup of cooked spaghetti squash has about 40 calories, compared with traditional spaghetti, which comes with more than 200. Spaghetti squash is also nutrient-dense. It's a good source of fiber and Vitamins A and C, and it can be eaten just like you would eat pasta-with a great tomato sauce and [place] meatballs or with pesto, tofu and spinach, for example. 10. Dress up your chili, soups and stews with non-fat [place] yogurt instead of sour cream. Just a 'dollop' of sour cream can set you back 115 calories and a whopping 12g of fat-seven of which are of the artery-clogging variety. Added bonus: [place] yogurt is packed with muscle-building protein, calcium and B Vitamins. 11. Mash cauliflower instead of mashed potatoes. One cup of traditional mashed potatoes-in all their creamy goodness-has more than 200 calories, compared to mashed cauliflower, which you can typically eat for less than 100 calories per 1 cup serving.  Cauliflower is a great source of the antioxidant indole-3-carbinol (I3C), which may help reduce the risk of some cancers, like breast cancer. 12. Ditch the ice cream sundae in favor of a [place] yogurt parfait. Instead of a cup of ice cream or fro-yo for dessert, try 1 cup of nonfat Greek yogurt topped with fresh berries and a sprinkle of cacao nibs. Both toppings are packed with antioxidants, which can help reduce cellular inflammation and oxidative damage. And the comparison is a no-brainer: One cup of ice cream has about 275 calories; one cup of frozen yogurt has about 230; and a cup of Greek yogurt has just 130, plus twice the protein, so you're less likely to return to the freezer for a second helping. 13. Put olive oil in a spray container instead of using it directly from the bottle. Each tablespoon of olive oil is 120 calories and 15g of fat. Use a mister instead of pouring it straight into the pan or onto a salad. This allows for portion control and will save you more than 100 calories. 14. When baking, substitute canned pumpkin for butter or oil. Canned pumpkin-not pumpkin pie mix-is loaded with Vitamin A, which is important for skin and eye health, as well as immunity. And the comparisons are pretty crazy:  cup of canned pumpkin has about 40 calories, compared to butter or oil, which has more than 800 calories. Yes, 800 calories. Applesauce and mashed banana can also serve as good substitutions for butter or oil, usually in a 1:1 ratio. 15. Top casseroles with high-fiber cereal instead of breadcrumbs. Breadcrumbs are typically made with white bread, while breakfast cereals contain 5-9g of fiber per serving. Not only will you save more than 150 calories per  cup serving, the swap will also keep you more full and you'll get a metabolism boost from the added fiber. 16. Snack on pistachios instead of macadamia nuts. Believe it or not, you get the same amount of calories from 35  pistachios (100  calories) as you would from only five macadamia nuts. 17. Chow down on kale chips rather than potato chips. This is my favorite 'don't knock it 'till you try it' swap. Kale chips are so easy to make at home, and you can spice them up with a little grated parmesan or chili powder. Plus, they're a mere fraction of the calories of potato chips, but with the same crunch factor we crave so often. 18. Add seltzer and some fruit slices to your cocktail instead of soda or fruit juice. One cup of soda or fruit juice can pack on as much as 140 calories. Instead, use seltzer and fruit slices. The fruit provides valuable phytochemicals, such as flavonoids and anthocyanins, which help to combat cancer and stave off the aging process.  We want weight loss that will last so you should lose 1-2 pounds a week.  THAT IS IT! Please pick THREE things a month to change. Once it is a habit check off the item. Then pick another three items off the list to become habits.  If you are already doing a habit on the list GREAT!  Cross that item off! o Don't drink your calories. Ie, alcohol, soda, fruit juice, and sweet tea.  o Drink more water. Drink a glass when you feel hungry or before each meal.  o Eat breakfast - Complex carb and protein (likeDannon light and fit yogurt, oatmeal, fruit, eggs, [place] bacon). o Measure your cereal.  Eat no more than one cup a day. (ie [place]) o Eat an apple a day. o Add a vegetable a day. o Try a new vegetable a month. o Use Pam! Stop using oil or butter to cook. o Don't finish your plate or use smaller plates. o Share your dessert. o Eat sugar free Jello for dessert or frozen grapes. o Don't eat 2-3 hours before bed. o Switch to whole wheat bread, pasta, and brown rice. o Make healthier choices when you eat out. No fries! o Pick baked chicken, NOT fried. o Don't forget to SLOW DOWN when you eat. It is not going anywhere.  o Take the stairs. o Park far away in the parking lot o [employer] (or weights) for 10 minutes while watching TV. o Walk at work for 10 minutes during break. o Walk outside 1 time a week with your friend, kids, dog, or significant other. o Start a walking group at [place] the mall as much as you can tolerate.  o Keep a food diary. o Weigh yourself daily. o Walk for 15 minutes 3 days per week. o Cook at home more often and eat out less.  If life happens and you go back to old habits, it is okay.  Just start over. You can do it!   If you experience chest pain, get short of breath, or tired during the exercise, please stop immediately and inform your doctor.

## 2014-11-26 LAB — MAGNESIUM: Magnesium: 2.1 mg/dL (ref 1.5–2.5)

## 2014-11-26 LAB — LIPID PANEL
Cholesterol: 181 mg/dL (ref 0–200)
HDL: 62 mg/dL (ref >39)
LDL Cholesterol: 109 mg/dL — ABNORMAL HIGH (ref 0–99)
Total CHOL/HDL Ratio: 2.9 Ratio
Triglycerides: 52 mg/dL (ref <150)
VLDL: 10 mg/dL (ref 0–40)

## 2014-11-26 LAB — BASIC METABOLIC PANEL WITH GFR
BUN: 11 mg/dL (ref 6–23)
CO2: 30 mEq/L (ref 19–32)
Calcium: 9.7 mg/dL (ref 8.4–10.5)
Chloride: 107 mEq/L (ref 96–112)
Creat: 0.65 mg/dL (ref 0.50–1.10)
GFR, Est African American: >89 mL/min
GFR, Est Non African American: >89 mL/min
Glucose, Bld: 90 mg/dL (ref 70–99)
Potassium: 5 mEq/L (ref 3.5–5.3)
Sodium: 144 mEq/L (ref 135–145)

## 2014-11-26 LAB — CBC WITH DIFFERENTIAL/PLATELET
Basophils Absolute: 0 10*3/uL (ref 0.0–0.1)
Basophils Relative: 0 % (ref 0–1)
Eosinophils Absolute: 0.1 10*3/uL (ref 0.0–0.7)
Eosinophils Relative: 2 % (ref 0–5)
HCT: 41.8 % (ref 36.0–46.0)
Hemoglobin: 14 g/dL (ref 12.0–15.0)
Lymphocytes Relative: 48 % — ABNORMAL HIGH (ref 12–46)
Lymphs Abs: 2.3 10*3/uL (ref 0.7–4.0)
MCH: 30.4 pg (ref 26.0–34.0)
MCHC: 33.5 g/dL (ref 30.0–36.0)
MCV: 90.7 fL (ref 78.0–100.0)
MPV: 9.6 fL (ref 8.6–12.4)
Monocytes Absolute: 0.4 10*3/uL (ref 0.1–1.0)
Monocytes Relative: 9 % (ref 3–12)
Neutro Abs: 1.9 10*3/uL (ref 1.7–7.7)
Neutrophils Relative %: 41 % — ABNORMAL LOW (ref 43–77)
Platelets: 248 10*3/uL (ref 150–400)
RBC: 4.61 MIL/uL (ref 3.87–5.11)
RDW: 14.1 % (ref 11.5–15.5)
WBC: 4.7 10*3/uL (ref 4.0–10.5)

## 2014-11-26 LAB — HEPATIC FUNCTION PANEL
ALT: 13 U/L (ref 0–35)
AST: 14 U/L (ref 0–37)
Albumin: 4.2 g/dL (ref 3.5–5.2)
Alkaline Phosphatase: 100 U/L (ref 39–117)
Bilirubin, Direct: 0.1 mg/dL (ref 0.0–0.3)
Indirect Bilirubin: 0.6 mg/dL (ref 0.2–1.2)
Total Bilirubin: 0.7 mg/dL (ref 0.2–1.2)
Total Protein: 7.3 g/dL (ref 6.0–8.3)

## 2014-11-26 LAB — VITAMIN D 25 HYDROXY (VIT D DEFICIENCY, FRACTURES): Vit D, 25-Hydroxy: 16 ng/mL — ABNORMAL LOW (ref 30–100)

## 2014-11-26 LAB — HEMOGLOBIN A1C
Hgb A1c MFr Bld: 5.5 % (ref <5.7)
Mean Plasma Glucose: 111 mg/dL (ref <117)

## 2014-11-26 LAB — TSH: TSH: 2.509 u[IU]/mL (ref 0.350–4.500)

## 2014-12-08 ENCOUNTER — Ambulatory Visit (INDEPENDENT_AMBULATORY_CARE_PROVIDER_SITE_OTHER): Payer: BLUE CROSS/BLUE SHIELD | Admitting: *Deleted

## 2014-12-08 ENCOUNTER — Telehealth: Payer: Self-pay | Admitting: Cardiology

## 2014-12-08 DIAGNOSIS — I441 Atrioventricular block, second degree: Secondary | ICD-10-CM

## 2014-12-08 NOTE — Telephone Encounter (Signed)
Spoke with pt and reminded pt of remote transmission that is due today. Pt verbalized understanding.   

## 2014-12-09 LAB — MDC_IDC_ENUM_SESS_TYPE_REMOTE
Battery Remaining Longevity: 135 mo
Battery Remaining Percentage: 95.5 %
Battery Voltage: 2.99 V
Brady Statistic AP VP Percent: 43 %
Brady Statistic AP VS Percent: 1 %
Brady Statistic AS VP Percent: 57 %
Brady Statistic AS VS Percent: 1 %
Brady Statistic RA Percent Paced: 42 %
Brady Statistic RV Percent Paced: 99 %
Date Time Interrogation Session: 20160303111306
Implantable Pulse Generator Model: 2210
Implantable Pulse Generator Serial Number: 7457437
Lead Channel Impedance Value: 490 Ohm
Lead Channel Impedance Value: 680 Ohm
Lead Channel Pacing Threshold Amplitude: 0.5 V
Lead Channel Pacing Threshold Amplitude: 0.5 V
Lead Channel Pacing Threshold Pulse Width: 0.4 ms
Lead Channel Pacing Threshold Pulse Width: 0.4 ms
Lead Channel Sensing Intrinsic Amplitude: 3.4 mV
Lead Channel Setting Pacing Amplitude: 0.75 V
Lead Channel Setting Pacing Amplitude: 1.5 V
Lead Channel Setting Pacing Pulse Width: 0.4 ms
Lead Channel Setting Sensing Sensitivity: 2 mV

## 2014-12-09 NOTE — Progress Notes (Signed)
Remote pacemaker transmission.   

## 2014-12-20 ENCOUNTER — Encounter: Payer: Self-pay | Admitting: Cardiology

## 2014-12-24 ENCOUNTER — Encounter: Payer: Self-pay | Admitting: Internal Medicine

## 2015-01-03 ENCOUNTER — Encounter: Payer: Self-pay | Admitting: Cardiology

## 2015-02-23 ENCOUNTER — Encounter: Payer: Self-pay | Admitting: Emergency Medicine

## 2015-02-25 ENCOUNTER — Encounter: Payer: Self-pay | Admitting: Physician Assistant

## 2015-02-25 ENCOUNTER — Ambulatory Visit (INDEPENDENT_AMBULATORY_CARE_PROVIDER_SITE_OTHER): Payer: BLUE CROSS/BLUE SHIELD | Admitting: Physician Assistant

## 2015-02-25 VITALS — BP 122/72 | HR 72 | Temp 97.8°F | Resp 16 | Ht 64.5 in | Wt 179.0 lb

## 2015-02-25 DIAGNOSIS — Z79899 Other long term (current) drug therapy: Secondary | ICD-10-CM

## 2015-02-25 DIAGNOSIS — Z8711 Personal history of peptic ulcer disease: Secondary | ICD-10-CM

## 2015-02-25 DIAGNOSIS — E663 Overweight: Secondary | ICD-10-CM | POA: Insufficient documentation

## 2015-02-25 DIAGNOSIS — I441 Atrioventricular block, second degree: Secondary | ICD-10-CM

## 2015-02-25 DIAGNOSIS — E669 Obesity, unspecified: Secondary | ICD-10-CM

## 2015-02-25 DIAGNOSIS — D126 Benign neoplasm of colon, unspecified: Secondary | ICD-10-CM

## 2015-02-25 DIAGNOSIS — K219 Gastro-esophageal reflux disease without esophagitis: Secondary | ICD-10-CM

## 2015-02-25 DIAGNOSIS — I10 Essential (primary) hypertension: Secondary | ICD-10-CM

## 2015-02-25 DIAGNOSIS — R7309 Other abnormal glucose: Secondary | ICD-10-CM

## 2015-02-25 DIAGNOSIS — K5900 Constipation, unspecified: Secondary | ICD-10-CM

## 2015-02-25 DIAGNOSIS — E559 Vitamin D deficiency, unspecified: Secondary | ICD-10-CM

## 2015-02-25 DIAGNOSIS — Z0000 Encounter for general adult medical examination without abnormal findings: Secondary | ICD-10-CM

## 2015-02-25 DIAGNOSIS — Z800 Family history of malignant neoplasm of digestive organs: Secondary | ICD-10-CM

## 2015-02-25 DIAGNOSIS — E785 Hyperlipidemia, unspecified: Secondary | ICD-10-CM

## 2015-02-25 DIAGNOSIS — I480 Paroxysmal atrial fibrillation: Secondary | ICD-10-CM

## 2015-02-25 LAB — CBC WITH DIFFERENTIAL/PLATELET
Basophils Absolute: 0 10*3/uL (ref 0.0–0.1)
Basophils Relative: 1 % (ref 0–1)
Eosinophils Absolute: 0.1 10*3/uL (ref 0.0–0.7)
Eosinophils Relative: 3 % (ref 0–5)
HCT: 41.5 % (ref 36.0–46.0)
Hemoglobin: 13.9 g/dL (ref 12.0–15.0)
Lymphocytes Relative: 49 % — ABNORMAL HIGH (ref 12–46)
Lymphs Abs: 2.3 10*3/uL (ref 0.7–4.0)
MCH: 30.6 pg (ref 26.0–34.0)
MCHC: 33.5 g/dL (ref 30.0–36.0)
MCV: 91.4 fL (ref 78.0–100.0)
MPV: 9.9 fL (ref 8.6–12.4)
Monocytes Absolute: 0.5 10*3/uL (ref 0.1–1.0)
Monocytes Relative: 11 % (ref 3–12)
Neutro Abs: 1.7 10*3/uL (ref 1.7–7.7)
Neutrophils Relative %: 36 % — ABNORMAL LOW (ref 43–77)
Platelets: 238 10*3/uL (ref 150–400)
RBC: 4.54 MIL/uL (ref 3.87–5.11)
RDW: 14.1 % (ref 11.5–15.5)
WBC: 4.7 10*3/uL (ref 4.0–10.5)

## 2015-02-25 NOTE — Progress Notes (Signed)
Complete Physical  Assessment and Plan: 1. Essential hypertension - continue medications, DASH diet, exercise and monitor at home. Call if greater than 130/80. - CBC with Differential/Platelet - BASIC METABOLIC PANEL WITH GFR - TSH - Hepatic function panel - Urinalysis, Routine w reflex microscopic - Microalbumin / creatinine urine ratio  2. Hyperlipidemia -continue medications, check lipids, decrease fatty foods, increase activity.  - Lipid panel  3. Obesity Obesity with co morbidities- long discussion about weight loss, diet, and exercise  4. Mobitz type 2 second degree and high-grade heart block Has pacemaker   5. Paroxysmal atrial fibrillation Not on coagulation, following with Cardio  6. Gastroesophageal reflux disease without esophagitis Continue PPI/H2 blocker, diet discussed  7. Abnormal glucose Discussed general issues about diabetes pathophysiology and management., Educational material distributed., Suggested low cholesterol diet., Encouraged aerobic exercise., Discussed foot care., Reminded to get yearly retinal exam. - Hemoglobin A1c - Insulin, fasting  8. Vitamin D deficiency May need to increase to 3 times a week.  - Vit D  25 hydroxy (rtn osteoporosis monitoring)  9. Medication management - Magnesium  10. History of peptic ulcer disease Avoid NSAIDS  11. Family history of malignant neoplasm of gastrointestinal tract monitor  12. Constipation, unspecified constipation type Better with increase exercise  13. Benign neoplasm of colon Due 3 years  6. Routine general medical examination at a health care facility   Discussed med's effects and SE's. Screening labs and tests as requested with regular follow-up as recommended. Over 40 minutes of exam, counseling, chart review, and critical decision making was performed this visit.   HPI  59 y.o. female  presents for a complete physical.  Her blood pressure has been controlled at home, today their BP  is BP: 122/72 mmHg She does workout, walking and running and done a 10 K.  She denies chest pain, shortness of breath, dizziness.  She has Afib and mobitz type 2, follows with Dr. [name] and Leonides Schanz, low risk and not on anticoagulation.  She is not on cholesterol medication, states that last time she was not on it. Her cholesterol is not at goal. The cholesterol last visit was:   Lab Results  Component Value Date   CHOL 181 11/25/2014   HDL 62 11/25/2014   LDLCALC 109* 11/25/2014   TRIG 52 11/25/2014   CHOLHDL 2.9 11/25/2014    She has been working on diet and exercise for prediabetes and has done a good job of decreasing her A1C, and denies paresthesia of the feet, polydipsia, polyuria and visual disturbances. Last A1C in the office was:  Lab Results  Component Value Date   HGBA1C 5.5 11/25/2014  Patient is on Vitamin D supplement, 50,000 IU only once a week.   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 She is engaged and might be getting married at he end of this year.   BMI is Body mass index is 30.26 kg/(m^2)., she is working on diet and exercise. Wt Readings from Last 3 Encounters:  02/25/15 179 lb (81.194 kg)  11/25/14 177 lb (80.287 kg)  08/17/14 171 lb 12.8 oz (77.928 kg)     Current Medications:  Current Outpatient Prescriptions on File Prior to Visit  Medication Sig Dispense Refill  . aspirin EC 81 MG tablet Take 81 mg by mouth daily.    . Vitamin D, Ergocalciferol, (DRISDOL) 50000 UNITS CAPS  capsule Take 1 capsule (50,000 Units total) by mouth every 7 (seven) days. 30 capsule 1   No current facility-administered medications on file prior to visit.   Health Maintenance:   Immunization History  Administered Date(s) Administered  . Influenza Split 06/30/2013, 07/20/2014  . PPD Test 02/22/2014  . Tdap 10/09/2011   Colonoscopy: 10/15/11 due 2018 with polyps  and FHX Mammo: 05/19/14 due in August BMD: 2015 WNL will wait until closer to 47 Pap/ Pelvic: 2015 WNL- repeat 2018 AB [place] 2011 Echo 01/2013  EYE: Dr. Peter Garter yearly WNL Dentist: Dr. Marga Hoots 22month  IMMUNIZATIONS: Tdap:2013 Pneumovax n/a: Prevnar 13: due at 59 Zostavax:n/a Influenza:2014  Patient Care Team: Unk Pinto, MD as PCP - General (Internal Medicine) Sable Feil, MD as Consulting Physician (Gastroenterology) Camillo Flaming, [place] as Referring Physician (Optometry) Fay Records, MD as Consulting Physician (Cardiology) Daryll Brod, MD as Consulting Physician (Orthopedic Surgery)  Allergies: No Known Allergies Medical History:  Past Medical History  Diagnosis Date  . Second degree Mobitz II AV block   . Hyperlipidemia   . Vitamin D deficiency   . Paroxysmal atrial fibrillation 6/15    detected on ppm interrogation   Surgical History:  Past Surgical History  Procedure Laterality Date  . Breast cyst excision  1980  . Wrist surgery  2011    removed cyst  . Pacemaker insertion  01/13/13    SJM Accent DR RF implanted by Dr [name] for mobitz II AV block  . Permanent pacemaker insertion N/A 01/13/2013    Procedure: PERMANENT PACEMAKER INSERTION;  Surgeon: Thompson Grayer, MD;  Location: [facility] CATH LAB;  Service: Cardiovascular;  Laterality: N/A;   Family History:  Family History  Problem Relation Age of Onset  . Colon cancer Sister   . Cancer Sister   . Cancer Mother     cervical  . Hypertension Mother   . Diabetes Mother   . Heart disease Father   . Hyperlipidemia Father   . Cancer Brother     prostate   Social History:  History  Substance Use Topics  . Smoking status: Never Smoker   . Smokeless tobacco: Never Used  . Alcohol Use: No   Review of Systems: Review of Systems  Constitutional: Negative.   HENT: Negative.   Eyes: Negative.   Respiratory: Negative.   Cardiovascular: Negative.   Gastrointestinal: Negative.   Genitourinary: Negative.    Musculoskeletal: Negative.   Skin: Negative.   Neurological: Negative.   Endo/Heme/Allergies: Negative.   Psychiatric/Behavioral: Negative for depression, suicidal ideas, hallucinations, memory loss and substance abuse. The patient has insomnia. The patient is not nervous/anxious.    Physical Exam: Estimated body mass index is 30.26 kg/(m^2) as calculated from the following:   Height as of this encounter: 5' 4.5" (1.638 m).   Weight as of this encounter: 179 lb (81.194 kg). BP 122/72 mmHg  Pulse 72  Temp(Src) 97.8 F (36.6 C)  Resp 16  Ht 5' 4.5" (1.638 m)  Wt 179 lb (81.194 kg)  BMI 30.26 kg/m2 General Appearance: Well nourished, in no apparent distress.  Eyes: PERRLA, EOMs, conjunctiva no swelling or erythema, normal fundi and vessels.  Sinuses: No Frontal/maxillary tenderness  ENT/Mouth: Ext aud canals clear, normal light reflex with TMs without erythema, bulging. Good dentition. No erythema, swelling, or exudate on post pharynx. Tonsils not swollen or erythematous. Hearing normal.  Neck: Supple, thyroid normal. No bruits  Respiratory: Respiratory effort normal, BS equal bilaterally without rales, rhonchi, wheezing or stridor.  Cardio: RRR without murmurs, rubs or gallops. Brisk peripheral pulses without edema.  Chest: symmetric, with normal excursions and percussion.  Breasts: Symmetric, without lumps, nipple discharge, retractions.  Abdomen: Soft, nontender, no guarding, rebound, hernias, masses, or organomegaly.  Lymphatics: Non tender without lymphadenopathy.  Genitourinary: defer Musculoskeletal: Full ROM all peripheral extremities,5/5 strength, and normal gait.  Skin: Warm, dry without rashes, lesions, ecchymosis. Neuro: Cranial nerves intact, reflexes equal bilaterally. Normal muscle tone, no cerebellar symptoms. Sensation intact.  Psych: Awake and oriented X 3, normal affect, Insight and Judgment appropriate.   EKG: defer   Vicie Mutters 9:42 AM [facility]

## 2015-02-25 NOTE — Patient Instructions (Addendum)
- Albolene is found in the facial cleanser section at [facility], [facility], or [facility]. It is a large jar with a blue top. This is the best lubricant for women because it is hypoallergenic.   Can try melatonin 80m-15 mg at night for sleep can do 1-3 hours before bed, can also do benadryl 25-511mat night for sleep.  If this does not help we can try prescription medication.  Also here is some information about good sleep hygiene.   Insomnia Insomnia is frequent trouble falling and/or staying asleep. Insomnia can be a long term problem or a short term problem. Both are common. Insomnia can be a short term problem when the wakefulness is related to a certain stress or worry. Long term insomnia is often related to ongoing stress during waking hours and/or poor sleeping habits. Overtime, sleep deprivation itself can make the problem worse. Every little thing feels more severe because you are overtired and your ability to cope is decreased. CAUSES   Stress, anxiety, and depression.  Poor sleeping habits.  Distractions such as TV in the bedroom.  Naps close to bedtime.  Engaging in emotionally charged conversations before bed.  Technical reading before sleep.  Alcohol and other sedatives. They may make the problem worse. They can hurt normal sleep patterns and normal dream activity.  Stimulants such as caffeine for several hours prior to bedtime.  Pain syndromes and shortness of breath can cause insomnia.  Exercise late at night.  Changing time zones may cause sleeping problems (jet lag). It is sometimes helpful to have someone observe your sleeping patterns. They should look for periods of not breathing during the night (sleep apnea). They should also look to see how long those periods last. If you live alone or observers are uncertain, you can also be observed at a sleep clinic where your sleep patterns will be professionally monitored. Sleep apnea requires a checkup and treatment. Give your  caregivers your medical history. Give your caregivers observations your family has made about your sleep.  SYMPTOMS   Not feeling rested in the morning.  Anxiety and restlessness at bedtime.  Difficulty falling and staying asleep. TREATMENT   Your caregiver may prescribe treatment for an underlying medical disorders. Your caregiver can give advice or help if you are using alcohol or other drugs for self-medication. Treatment of underlying problems will usually eliminate insomnia problems.  Medications can be prescribed for short time use. They are generally not recommended for lengthy use.  Over-the-counter sleep medicines are not recommended for lengthy use. They can be habit forming.  You can promote easier sleeping by making lifestyle changes such as:  Using relaxation techniques that help with breathing and reduce muscle tension.  Exercising earlier in the day.  Changing your diet and the time of your last meal. No night time snacks.  Establish a regular time to go to bed.  Counseling can help with stressful problems and worry.  Soothing music and white noise may be helpful if there are background noises you cannot remove.  Stop tedious detailed work at least one hour before bedtime. HOME CARE INSTRUCTIONS   Keep a diary. Inform your caregiver about your progress. This includes any medication side effects. See your caregiver regularly. Take note of:  Times when you are asleep.  Times when you are awake during the night.  The quality of your sleep.  How you feel the next day. This information will help your caregiver care for you.  Get out of bed if  you are still awake after 15 minutes. Read or do some quiet activity. Keep the lights down. Wait until you feel sleepy and go back to bed.  Keep regular sleeping and waking hours. Avoid naps.  Exercise regularly.  Avoid distractions at bedtime. Distractions include watching television or engaging in any intense or  detailed activity like attempting to balance the household checkbook.  Develop a bedtime ritual. Keep a familiar routine of bathing, brushing your teeth, climbing into bed at the same time each night, listening to soothing music. Routines increase the success of falling to sleep faster.  Use relaxation techniques. This can be using breathing and muscle tension release routines. It can also include visualizing peaceful scenes. You can also help control troubling or intruding thoughts by keeping your mind occupied with boring or repetitive thoughts like the old concept of counting sheep. You can make it more creative like imagining planting one beautiful flower after another in your backyard garden.  During your day, work to eliminate stress. When this is not possible use some of the previous suggestions to help reduce the anxiety that accompanies stressful situations. MAKE SURE YOU:   Understand these instructions.  Will watch your condition.  Will get help right away if you are not doing well or get worse. Document Released: 09/21/2000 Document Revised: 12/17/2011 Document Reviewed: 10/22/2007 [facility] Patient Information 2015 [place], [place]. This information is not intended to replace advice given to you by your health care provider. Make sure you discuss any questions you have with your health care provider.   Preventive Care for Adults A healthy lifestyle and preventive care can promote health and wellness. Preventive health guidelines for women include the following key practices.  A routine yearly physical is a good way to check with your health care provider about your health and preventive screening. It is a chance to share any concerns and updates on your health and to receive a thorough exam.  Visit your dentist for a routine exam and preventive care every 6 months. Brush your teeth twice a day and floss once a day. Good oral hygiene prevents tooth decay and gum disease.  The  frequency of eye exams is based on your age, health, family medical history, use of contact lenses, and other factors. Follow your health care provider's recommendations for frequency of eye exams.  Eat a healthy diet. Foods like vegetables, fruits, whole grains, low-fat dairy products, and lean protein foods contain the nutrients you need without too many calories. Decrease your intake of foods high in solid fats, added sugars, and salt. Eat the right amount of calories for you.Get information about a proper diet from your health care provider, if necessary.  Regular physical exercise is one of the most important things you can do for your health. Most adults should get at least 150 minutes of moderate-intensity exercise (any activity that increases your heart rate and causes you to sweat) each week. In addition, most adults need muscle-strengthening exercises on 2 or more days a week.  Maintain a healthy weight. The body mass index (BMI) is a screening tool to identify possible weight problems. It provides an estimate of body fat based on height and weight. Your health care provider can find your BMI and can help you achieve or maintain a healthy weight.For adults 20 years and older:  A BMI below 18.5 is considered underweight.  A BMI of 18.5 to 24.9 is normal.  A BMI of 25 to 29.9 is considered overweight.  A  BMI of 30 and above is considered obese.  Maintain normal blood lipids and cholesterol levels by exercising and minimizing your intake of saturated fat. Eat a balanced diet with plenty of fruit and vegetables. If your lipid or cholesterol levels are high, you are over 50, or you are at high risk for heart disease, you may need your cholesterol levels checked more frequently.Ongoing high lipid and cholesterol levels should be treated with medicines if diet and exercise are not working.  If you smoke, find out from your health care provider how to quit. If you do not use tobacco, do not  start.  Lung cancer screening is recommended for adults aged 80-80 years who are at high risk for developing lung cancer because of a history of smoking. A yearly low-dose CT scan of the lungs is recommended for people who have at least a 30-pack-year history of smoking and are a current smoker or have quit within the past 15 years. A pack year of smoking is smoking an average of 1 pack of cigarettes a day for 1 year (for example: 1 pack a day for 30 years or 2 packs a day for 15 years). Yearly screening should continue until the smoker has stopped smoking for at least 15 years. Yearly screening should be stopped for people who develop a health problem that would prevent them from having lung cancer treatment.  Avoid use of street drugs. Do not share needles with anyone. Ask for help if you need support or instructions about stopping the use of drugs.  High blood pressure causes heart disease and increases the risk of stroke.  Ongoing high blood pressure should be treated with medicines if weight loss and exercise do not work.  If you are 52-75 years old, ask your health care provider if you should take aspirin to prevent strokes.  Diabetes screening involves taking a blood sample to check your fasting blood sugar level. This should be done once every 3 years, after age 51, if you are within normal weight and without risk factors for diabetes. Testing should be considered at a younger age or be carried out more frequently if you are overweight and have at least 1 risk factor for diabetes.  Breast cancer screening is essential preventive care for women. You should practice "breast self-awareness." This means understanding the normal appearance and feel of your breasts and may include breast self-examination. Any changes detected, no matter how small, should be reported to a health care provider. Women in their 80s and 30s should have a clinical breast exam (CBE) by a health care provider as part of a  regular health exam every 1 to 3 years. After age 63, women should have a CBE every year. Starting at age 58, women should consider having a mammogram (breast X-ray test) every year. Women who have a family history of breast cancer should talk to their health care provider about genetic screening. Women at a high risk of breast cancer should talk to their health care providers about having an MRI and a mammogram every year.  Breast cancer gene (BRCA)-related cancer risk assessment is recommended for women who have family members with BRCA-related cancers. BRCA-related cancers include breast, ovarian, tubal, and peritoneal cancers. Having family members with these cancers may be associated with an increased risk for harmful changes (mutations) in the breast cancer genes BRCA1 and BRCA2. Results of the assessment will determine the need for genetic counseling and BRCA1 and BRCA2 testing.  Routine pelvic exams to  screen for cancer are no longer recommended for nonpregnant women who are considered low risk for cancer of the pelvic organs (ovaries, uterus, and vagina) and who do not have symptoms. Ask your health care provider if a screening pelvic exam is right for you.  If you have had past treatment for cervical cancer or a condition that could lead to cancer, you need Pap tests and screening for cancer for at least 20 years after your treatment. If Pap tests have been discontinued, your risk factors (such as having a new sexual partner) need to be reassessed to determine if screening should be resumed. Some women have medical problems that increase the chance of getting cervical cancer. In these cases, your health care provider may recommend more frequent screening and Pap tests.    Colorectal cancer can be detected and often prevented. Most routine colorectal cancer screening begins at the age of 64 years and continues through age 15 years. However, your health care provider may recommend screening at an  earlier age if you have risk factors for colon cancer. On a yearly basis, your health care provider may provide home test kits to check for hidden blood in the stool. Use of a small camera at the end of a tube, to directly examine the colon (sigmoidoscopy or colonoscopy), can detect the earliest forms of colorectal cancer. Talk to your health care provider about this at age 50, when routine screening begins. Direct exam of the colon should be repeated every 5-10 years through age 30 years, unless early forms of pre-cancerous polyps or small growths are found.  Osteoporosis is a disease in which the bones lose minerals and strength with aging. This can result in serious bone fractures or breaks. The risk of osteoporosis can be identified using a bone density scan. Women ages 61 years and over and women at risk for fractures or osteoporosis should discuss screening with their health care providers. Ask your health care provider whether you should take a calcium supplement or vitamin D to reduce the rate of osteoporosis.  Menopause can be associated with physical symptoms and risks. Hormone replacement therapy is available to decrease symptoms and risks. You should talk to your health care provider about whether hormone replacement therapy is right for you.  Use sunscreen. Apply sunscreen liberally and repeatedly throughout the day. You should seek shade when your shadow is shorter than you. Protect yourself by wearing long sleeves, pants, a wide-brimmed hat, and sunglasses year round, whenever you are outdoors.  Once a month, do a whole body skin exam, using a mirror to look at the skin on your back. Tell your health care provider of new moles, moles that have irregular borders, moles that are larger than a pencil eraser, or moles that have changed in shape or color.  Stay current with required vaccines (immunizations).  Influenza vaccine. All adults should be immunized every year.  Tetanus, diphtheria,  and acellular pertussis (Td, Tdap) vaccine. Pregnant women should receive 1 dose of Tdap vaccine during each pregnancy. The dose should be obtained regardless of the length of time since the last dose. Immunization is preferred during the 27th-36th week of gestation. An adult who has not previously received Tdap or who does not know her vaccine status should receive 1 dose of Tdap. This initial dose should be followed by tetanus and diphtheria toxoids (Td) booster doses every 10 years. Adults with an unknown or incomplete history of completing a 3-dose immunization series with Td-containing vaccines should begin  or complete a primary immunization series including a Tdap dose. Adults should receive a Td booster every 10 years.    Zoster vaccine. One dose is recommended for adults aged 21 years or older unless certain conditions are present.    Pneumococcal 13-valent conjugate (PCV13) vaccine. When indicated, a person who is uncertain of her immunization history and has no record of immunization should receive the PCV13 vaccine. An adult aged 40 years or older who has certain medical conditions and has not been previously immunized should receive 1 dose of PCV13 vaccine. This PCV13 should be followed with a dose of pneumococcal polysaccharide (PPSV23) vaccine. The PPSV23 vaccine dose should be obtained at least 8 weeks after the dose of PCV13 vaccine. An adult aged 49 years or older who has certain medical conditions and previously received 1 or more doses of PPSV23 vaccine should receive 1 dose of PCV13. The PCV13 vaccine dose should be obtained 1 or more years after the last PPSV23 vaccine dose.    Pneumococcal polysaccharide (PPSV23) vaccine. When PCV13 is also indicated, PCV13 should be obtained first. All adults aged 49 years and older should be immunized. An adult younger than age 81 years who has certain medical conditions should be immunized. Any person who resides in a nursing home or long-term  care facility should be immunized. An adult smoker should be immunized. People with an immunocompromised condition and certain other conditions should receive both PCV13 and PPSV23 vaccines. People with human immunodeficiency virus (HIV) infection should be immunized as soon as possible after diagnosis. Immunization during chemotherapy or radiation therapy should be avoided. Routine use of PPSV23 vaccine is not recommended for American Indians, [place]atives, or people younger than 65 years unless there are medical conditions that require PPSV23 vaccine. When indicated, people who have unknown immunization and have no record of immunization should receive PPSV23 vaccine. One-time revaccination 5 years after the first dose of PPSV23 is recommended for people aged 19-64 years who have chronic kidney failure, nephrotic syndrome, asplenia, or immunocompromised conditions. People who received 1-2 doses of PPSV23 before age 60 years should receive another dose of PPSV23 vaccine at age 11 years or later if at least 5 years have passed since the previous dose. Doses of PPSV23 are not needed for people immunized with PPSV23 at or after age 35 years.   Preventive Services / Frequency  Ages 59 years and over  Blood pressure check.  Lipid and cholesterol check.  Lung cancer screening. / Every year if you are aged 22-80 years and have a 30-pack-year history of smoking and currently smoke or have quit within the past 15 years. Yearly screening is stopped once you have quit smoking for at least 15 years or develop a health problem that would prevent you from having lung cancer treatment.  Clinical breast exam.** / Every year after age 11 years.  BRCA-related cancer risk assessment.** / For women who have family members with a BRCA-related cancer (breast, ovarian, tubal, or peritoneal cancers).  Mammogram.** / Every year beginning at age 5 years and continuing for as long as you are in good health. Consult with  your health care provider.  Pap test.** / Every 3 years starting at age 74 years through age 52 or 38 years with 3 consecutive normal Pap tests. Testing can be stopped between 65 and 70 years with 3 consecutive normal Pap tests and no abnormal Pap or HPV tests in the past 10 years.  Fecal occult blood test (FOBT) of stool. /  Every year beginning at age 77 years and continuing until age 46 years. You may not need to do this test if you get a colonoscopy every 10 years.  Flexible sigmoidoscopy or colonoscopy.** / Every 5 years for a flexible sigmoidoscopy or every 10 years for a colonoscopy beginning at age 51 years and continuing until age 5 years.  Hepatitis C blood test.** / For all people born from [birth year] through 1964 and any individual with known risks for hepatitis C.  Osteoporosis screening.** / A one-time screening for women ages 61 years and over and women at risk for fractures or osteoporosis.  Skin self-exam. / Monthly.  Influenza vaccine. / Every year.  Tetanus, diphtheria, and acellular pertussis (Tdap/Td) vaccine.** / 1 dose of Td every 10 years.  Zoster vaccine.** / 1 dose for adults aged 71 years or older.  Pneumococcal 13-valent conjugate (PCV13) vaccine.** / Consult your health care provider.  Pneumococcal polysaccharide (PPSV23) vaccine.** / 1 dose for all adults aged 74 years and older. Screening for abdominal aortic aneurysm (AAA)  by ultrasound is recommended for people who have history of high blood pressure or who are current or former smokers.

## 2015-02-26 LAB — URINALYSIS, ROUTINE W REFLEX MICROSCOPIC
Bilirubin Urine: NEGATIVE
Glucose, UA: NEGATIVE mg/dL
Hgb urine dipstick: NEGATIVE
Ketones, ur: NEGATIVE mg/dL
Leukocytes, UA: NEGATIVE
Nitrite: NEGATIVE
Protein, ur: NEGATIVE mg/dL
Specific Gravity, Urine: <1.005 — ABNORMAL LOW (ref 1.005–1.030)
Urobilinogen, UA: 0.2 mg/dL (ref 0.0–1.0)
pH: 7.5 (ref 5.0–8.0)

## 2015-02-26 LAB — HEPATIC FUNCTION PANEL
ALT: 14 U/L (ref 0–35)
AST: 16 U/L (ref 0–37)
Albumin: 4.2 g/dL (ref 3.5–5.2)
Alkaline Phosphatase: 94 U/L (ref 39–117)
Bilirubin, Direct: 0.2 mg/dL (ref 0.0–0.3)
Indirect Bilirubin: 0.6 mg/dL (ref 0.2–1.2)
Total Bilirubin: 0.8 mg/dL (ref 0.2–1.2)
Total Protein: 7.5 g/dL (ref 6.0–8.3)

## 2015-02-26 LAB — LIPID PANEL
Cholesterol: 170 mg/dL (ref 0–200)
HDL: 55 mg/dL (ref >=46)
LDL Cholesterol: 104 mg/dL — ABNORMAL HIGH (ref 0–99)
Total CHOL/HDL Ratio: 3.1 Ratio
Triglycerides: 54 mg/dL (ref <150)
VLDL: 11 mg/dL (ref 0–40)

## 2015-02-26 LAB — BASIC METABOLIC PANEL WITH GFR
BUN: 11 mg/dL (ref 6–23)
CO2: 25 mEq/L (ref 19–32)
Calcium: 9.5 mg/dL (ref 8.4–10.5)
Chloride: 105 mEq/L (ref 96–112)
Creat: 0.72 mg/dL (ref 0.50–1.10)
GFR, Est African American: >89 mL/min
GFR, Est Non African American: >89 mL/min
Glucose, Bld: 79 mg/dL (ref 70–99)
Potassium: 5 mEq/L (ref 3.5–5.3)
Sodium: 142 mEq/L (ref 135–145)

## 2015-02-26 LAB — HEMOGLOBIN A1C
Hgb A1c MFr Bld: 5.7 % — ABNORMAL HIGH (ref <5.7)
Mean Plasma Glucose: 117 mg/dL — ABNORMAL HIGH (ref <117)

## 2015-02-26 LAB — INSULIN, FASTING: Insulin fasting, serum: 4.7 u[IU]/mL (ref 2.0–19.6)

## 2015-02-26 LAB — VITAMIN D 25 HYDROXY (VIT D DEFICIENCY, FRACTURES): Vit D, 25-Hydroxy: 38 ng/mL (ref 30–100)

## 2015-02-26 LAB — TSH: TSH: 2.696 u[IU]/mL (ref 0.350–4.500)

## 2015-02-26 LAB — MICROALBUMIN / CREATININE URINE RATIO
Creatinine, Urine: 21.2 mg/dL
Microalb Creat Ratio: 9.4 mg/g (ref 0.0–30.0)
Microalb, Ur: 0.2 mg/dL (ref <2.0)

## 2015-02-26 LAB — MAGNESIUM: Magnesium: 2 mg/dL (ref 1.5–2.5)

## 2015-03-06 ENCOUNTER — Encounter: Payer: Self-pay | Admitting: *Deleted

## 2015-03-14 ENCOUNTER — Ambulatory Visit (INDEPENDENT_AMBULATORY_CARE_PROVIDER_SITE_OTHER): Payer: BLUE CROSS/BLUE SHIELD | Admitting: *Deleted

## 2015-03-14 ENCOUNTER — Telehealth: Payer: Self-pay | Admitting: Cardiology

## 2015-03-14 DIAGNOSIS — I441 Atrioventricular block, second degree: Secondary | ICD-10-CM | POA: Diagnosis not present

## 2015-03-14 NOTE — Telephone Encounter (Signed)
Spoke with pt and reminded pt of remote transmission that is due today. Pt verbalized understanding.   

## 2015-03-15 NOTE — Progress Notes (Signed)
Remote pacemaker transmission.   

## 2015-03-19 LAB — CUP PACEART REMOTE DEVICE CHECK
Battery Remaining Longevity: 137 mo
Battery Remaining Percentage: 95.5 %
Battery Voltage: 2.99 V
Brady Statistic AP VP Percent: 43 %
Brady Statistic AP VS Percent: 1 %
Brady Statistic AS VP Percent: 56 %
Brady Statistic AS VS Percent: 1 %
Brady Statistic RA Percent Paced: 43 %
Brady Statistic RV Percent Paced: 99 %
Date Time Interrogation Session: 20160607014512
Lead Channel Impedance Value: 530 Ohm
Lead Channel Impedance Value: 750 Ohm
Lead Channel Pacing Threshold Amplitude: 0.5 V
Lead Channel Pacing Threshold Amplitude: 0.5 V
Lead Channel Pacing Threshold Pulse Width: 0.4 ms
Lead Channel Pacing Threshold Pulse Width: 0.4 ms
Lead Channel Sensing Intrinsic Amplitude: 12 mV
Lead Channel Sensing Intrinsic Amplitude: 4.1 mV
Lead Channel Setting Pacing Amplitude: 0.75 V
Lead Channel Setting Pacing Amplitude: 1.5 V
Lead Channel Setting Pacing Pulse Width: 0.4 ms
Lead Channel Setting Sensing Sensitivity: 2 mV
Pulse Gen Model: 2210
Pulse Gen Serial Number: 7457437

## 2015-03-29 ENCOUNTER — Encounter: Payer: Self-pay | Admitting: Cardiology

## 2015-03-30 ENCOUNTER — Encounter: Payer: Self-pay | Admitting: Internal Medicine

## 2015-04-12 ENCOUNTER — Encounter: Payer: Self-pay | Admitting: Cardiology

## 2015-05-10 ENCOUNTER — Ambulatory Visit (INDEPENDENT_AMBULATORY_CARE_PROVIDER_SITE_OTHER): Payer: BLUE CROSS/BLUE SHIELD | Admitting: *Deleted

## 2015-05-10 DIAGNOSIS — Z111 Encounter for screening for respiratory tuberculosis: Secondary | ICD-10-CM

## 2015-05-13 LAB — TB SKIN TEST
Induration: 0 mm
TB Skin Test: NEGATIVE

## 2015-06-01 ENCOUNTER — Ambulatory Visit (INDEPENDENT_AMBULATORY_CARE_PROVIDER_SITE_OTHER): Payer: BLUE CROSS/BLUE SHIELD | Admitting: Internal Medicine

## 2015-06-01 VITALS — BP 160/92 | HR 66 | Temp 97.8°F | Resp 18 | Ht 64.5 in | Wt 180.0 lb

## 2015-06-01 DIAGNOSIS — R350 Frequency of micturition: Secondary | ICD-10-CM

## 2015-06-01 DIAGNOSIS — R1030 Lower abdominal pain, unspecified: Secondary | ICD-10-CM | POA: Diagnosis not present

## 2015-06-02 ENCOUNTER — Other Ambulatory Visit: Payer: Self-pay | Admitting: *Deleted

## 2015-06-02 LAB — URINALYSIS, ROUTINE W REFLEX MICROSCOPIC
Bilirubin Urine: NEGATIVE
Glucose, UA: NEGATIVE
Hgb urine dipstick: NEGATIVE
Ketones, ur: NEGATIVE
Leukocytes, UA: NEGATIVE
Nitrite: NEGATIVE
Protein, ur: NEGATIVE
Specific Gravity, Urine: 1.022 (ref 1.001–1.035)
pH: 6.5 (ref 5.0–8.0)

## 2015-06-02 MED ORDER — CIPROFLOXACIN HCL 500 MG PO TABS: 500.0000 mg | ORAL_TABLET | Freq: Two times a day (BID) | ORAL | Status: AC

## 2015-06-02 NOTE — Progress Notes (Signed)
   Subjective:    Patient ID: Alyssa Dudley, female    DOB: 1956/07/07, 59 y.o.   MRN: [id number]  HPI  Patient comes in reporting 2 weeks of lower abdominal pain and discomfort.  Pain is moderate and dull and achey.  She reports that she has been taking tylenol for the pain which is somewhat helpful.  She reports that the pain is associated with urinary frequency but she denies any hematuria, dysuria, or urinary urgency.  She denies any vaginal discharge. She does admit to a recent sexual encounter two weeks ago with a partner that was new with no active STIs that she is aware of.  She does report that she tends to get UTI every 2-3 years.  She cannot note whether thisis similar to previous UTI.   Review of Systems  Constitutional: Negative for fever, chills and fatigue.  Gastrointestinal: Positive for abdominal pain. Negative for nausea, vomiting, diarrhea and constipation.  Genitourinary: Positive for frequency. Negative for dysuria, urgency, hematuria, flank pain, vaginal bleeding, vaginal discharge, difficulty urinating, genital sores and vaginal pain.  Musculoskeletal: Negative for back pain.       Objective:   Physical Exam  Constitutional: She is oriented to person, place, and time. She appears well-developed and well-nourished. No distress.  HENT:  Head: Normocephalic.  Mouth/Throat: Oropharynx is clear and moist. No oropharyngeal exudate.  Eyes: Conjunctivae are normal. No scleral icterus.  Neck: Normal range of motion. Neck supple. No JVD present. No thyromegaly present.  Cardiovascular: Normal rate, regular rhythm, normal heart sounds and intact distal pulses.  Exam reveals no gallop and no friction rub.   No murmur heard. Pulmonary/Chest: Effort normal and breath sounds normal.  Abdominal: Soft. Normal appearance and bowel sounds are normal. She exhibits no distension and no mass. There is no tenderness. There is no rigidity, no rebound, no guarding, no CVA tenderness, no  tenderness at McBurney's point and negative Murphy's sign.  Musculoskeletal: Normal range of motion.  Lymphadenopathy:    She has no cervical adenopathy.  Neurological: She is alert and oriented to person, place, and time.  Skin: Skin is warm and dry. She is not diaphoretic.  Psychiatric: She has a normal mood and affect. Her behavior is normal. Judgment and thought content normal.  Vitals reviewed.   Filed Vitals:   06/02/15 1230  BP: 160/92  Pulse: 66  Temp: 97.8 F (36.6 C)  Resp: 18        Assessment & Plan:   1. Urinary frequency  - Urinalysis, Routine w reflex microscopic (not at [facility]) - Urine culture  2. Suprapubic pain, unspecified laterality  Not sure that this is definitive UTI based on history.  Patient deferred pelvic exam.  Will check urine and urine culture and have given cipro but if no improvement with abx and negative culture need to consider bringing back in to look for gyn cause of pain.

## 2015-06-03 LAB — URINE CULTURE: Colony Count: 3000

## 2015-06-21 ENCOUNTER — Encounter: Payer: Self-pay | Admitting: Internal Medicine

## 2015-06-21 ENCOUNTER — Ambulatory Visit (INDEPENDENT_AMBULATORY_CARE_PROVIDER_SITE_OTHER): Payer: BLUE CROSS/BLUE SHIELD | Admitting: Internal Medicine

## 2015-06-21 VITALS — BP 142/72 | HR 98 | Temp 98.0°F | Resp 18 | Ht 64.5 in | Wt 180.0 lb

## 2015-06-21 DIAGNOSIS — K219 Gastro-esophageal reflux disease without esophagitis: Secondary | ICD-10-CM | POA: Diagnosis not present

## 2015-06-21 DIAGNOSIS — R51 Headache: Secondary | ICD-10-CM

## 2015-06-21 DIAGNOSIS — Z634 Disappearance and death of family member: Principal | ICD-10-CM

## 2015-06-21 DIAGNOSIS — F4329 Adjustment disorder with other symptoms: Secondary | ICD-10-CM

## 2015-06-21 DIAGNOSIS — F4321 Adjustment disorder with depressed mood: Secondary | ICD-10-CM | POA: Diagnosis not present

## 2015-06-21 DIAGNOSIS — R519 Headache, unspecified: Secondary | ICD-10-CM

## 2015-06-21 MED ORDER — OMEPRAZOLE 20 MG PO CPDR: 20.0000 mg | DELAYED_RELEASE_CAPSULE | Freq: Every day | ORAL | Status: DC

## 2015-06-21 MED ORDER — ALPRAZOLAM 0.5 MG PO TABS: 0.5000 mg | ORAL_TABLET | Freq: Three times a day (TID) | ORAL | Status: DC | PRN

## 2015-06-21 MED ORDER — ATENOLOL 25 MG PO TABS: 25.0000 mg | ORAL_TABLET | Freq: Every day | ORAL | Status: DC

## 2015-06-21 NOTE — Patient Instructions (Addendum)
Please start taking omeprazole every morning on an emtpy stomach and wait 30 minutes prior to eating as this will help with your acid reflux.  Please take xanax as needed for severe anxiety or if you need help falling asleep. Try not to take more than 3 times daily.  It can be sedating so please do not drive within 3 hours of taking it.   Please take atenolol at bedtime to help prevent headaches.    Sertraline tablets What is this medicine? SERTRALINE (SER tra leen) is used to treat depression. It may also be used to treat obsessive compulsive disorder, panic disorder, post-trauma stress, premenstrual dysphoric disorder (PMDD) or social anxiety. This medicine may be used for other purposes; ask your health care provider or pharmacist if you have questions. COMMON BRAND NAME(S): Zoloft What should I tell my health care provider before I take this medicine? They need to know if you have any of these conditions: -bipolar disorder or a family history of bipolar disorder -diabetes -glaucoma -heart disease -high blood pressure -history of irregular heartbeat -history of low levels of calcium, magnesium, or potassium in the blood -if you often drink alcohol -liver disease -receiving electroconvulsive therapy -seizures -suicidal thoughts, plans, or attempt; a previous suicide attempt by you or a family member -thyroid disease -an unusual or allergic reaction to sertraline, other medicines, foods, dyes, or preservatives -pregnant or trying to get pregnant -breast-feeding How should I use this medicine? Take this medicine by mouth with a glass of water. Follow the directions on the prescription label. You can take it with or without food. Take your medicine at regular intervals. Do not take your medicine more often than directed. Do not stop taking this medicine suddenly except upon the advice of your doctor. Stopping this medicine too quickly may cause serious side effects or your condition may  worsen. A special MedGuide will be given to you by the pharmacist with each prescription and refill. Be sure to read this information carefully each time. Talk to your pediatrician regarding the use of this medicine in children. While this drug may be prescribed for children as young as 7 years for selected conditions, precautions do apply. Overdosage: If you think you have taken too much of this medicine contact a poison control center or emergency room at once. NOTE: This medicine is only for you. Do not share this medicine with others. What if I miss a dose? If you miss a dose, take it as soon as you can. If it is almost time for your next dose, take only that dose. Do not take double or extra doses. What may interact with this medicine? Do not take this medicine with any of the following medications: -certain medicines for fungal infections like fluconazole, itraconazole, ketoconazole, posaconazole, voriconazole -cisapride -disulfiram -dofetilide -linezolid -MAOIs like Carbex, Eldepryl, Marplan, Nardil, and Parnate -metronidazole -methylene blue (injected into a vein) -pimozide -thioridazine -ziprasidone This medicine may also interact with the following medications: -alcohol -aspirin and aspirin-like medicines -certain medicines for depression, anxiety, or psychotic disturbances -certain medicines for irregular heart beat like flecainide, propafenone -certain medicines for migraine headaches like almotriptan, eletriptan, frovatriptan, naratriptan, rizatriptan, sumatriptan, zolmitriptan -certain medicines for sleep -certain medicines for seizures like carbamazepine, valproic acid, phenytoin -certain medicines that treat or prevent blood clots like warfarin, enoxaparin, dalteparin -cimetidine -digoxin -diuretics -fentanyl -furazolidone -isoniazid -lithium -NSAIDs, medicines for pain and inflammation, like ibuprofen or naproxen -other medicines that prolong the QT interval  (cause an abnormal heart rhythm) -procarbazine -rasagiline -  supplements like St. John's wort, kava kava, valerian -tolbutamide -tramadol -tryptophan This list may not describe all possible interactions. Give your health care provider a list of all the medicines, herbs, non-prescription drugs, or dietary supplements you use. Also tell them if you smoke, drink alcohol, or use illegal drugs. Some items may interact with your medicine. What should I watch for while using this medicine? Tell your doctor if your symptoms do not get better or if they get worse. Visit your doctor or health care professional for regular checks on your progress. Because it may take several weeks to see the full effects of this medicine, it is important to continue your treatment as prescribed by your doctor. Patients and their families should watch out for new or worsening thoughts of suicide or depression. Also watch out for sudden changes in feelings such as feeling anxious, agitated, panicky, irritable, hostile, aggressive, impulsive, severely restless, overly excited and hyperactive, or not being able to sleep. If this happens, especially at the beginning of treatment or after a change in dose, call your health care professional. Alyssa Dudley may get drowsy or dizzy. Do not drive, use machinery, or do anything that needs mental alertness until you know how this medicine affects you. Do not stand or sit up quickly, especially if you are an older patient. This reduces the risk of dizzy or fainting spells. Alcohol may interfere with the effect of this medicine. Avoid alcoholic drinks. Your mouth may get dry. Chewing sugarless gum or sucking hard candy, and drinking plenty of water may help. Contact your doctor if the problem does not go away or is severe. What side effects may I notice from receiving this medicine? Side effects that you should report to your doctor or health care professional as soon as possible: -allergic reactions  like skin rash, itching or hives, swelling of the face, lips, or tongue -black or bloody stools, blood in the urine or vomit -fast, irregular heartbeat -feeling faint or lightheaded, falls -hallucination, loss of contact with reality -seizures -suicidal thoughts or other mood changes -unusual bleeding or bruising -unusually weak or tired -vomiting Side effects that usually do not require medical attention (report to your doctor or health care professional if they continue or are bothersome): -change in appetite -change in sex drive or performance -diarrhea -increased sweating -indigestion, nausea -tremors This list may not describe all possible side effects. Call your doctor for medical advice about side effects. You may report side effects to FDA at 1-800-FDA-1088. Where should I keep my medicine? Keep out of the reach of children. Store at room temperature between 15 and 30 degrees C (59 and 86 degrees F). Throw away any unused medicine after the expiration date. NOTE: This sheet is a summary. It may not cover all possible information. If you have questions about this medicine, talk to your doctor, pharmacist, or health care provider.  2015, Elsevier/[employer] Standard. (2013-04-21 12:57:35)

## 2015-06-21 NOTE — Progress Notes (Signed)
   Subjective:    Patient ID: Alyssa Dudley, female    DOB: November 29, 1955, 59 y.o.   MRN: [id number]  Headache  Pertinent negatives include no dizziness, fever, nausea or vomiting.  Patient reports that she has been having a headache that has been going on intermittently for the past 2-3 weeks.  She reports that the pain is nagging and mild to moderate.  She reports that the headaches are intermittent and get gradually worse.  She reports that she can take tylenol and this helps.  She reports that she is not sleeping well since the death of her son.  She reports that she is also having a lot more indigestion as well.  She feels like she is just existing and not thriving well.  She has never had headaches in the past.  She reports that she did have some increased stress.  She has tried [place] in the past, but she weaned herself off of it because she didn't want to be on something consistently.      Review of Systems  Constitutional: Negative for fever, chills and fatigue.  Eyes: Negative for visual disturbance.  Gastrointestinal: Negative for nausea and vomiting.       Indigestion   Neurological: Positive for headaches. Negative for dizziness and light-headedness.  Psychiatric/Behavioral: Positive for dysphoric mood and decreased concentration. The patient is not nervous/anxious.        Objective:   Physical Exam  Constitutional: She is oriented to person, place, and time. She appears well-developed and well-nourished. No distress.  HENT:  Head: Normocephalic.  Mouth/Throat: Oropharynx is clear and moist. No oropharyngeal exudate.  Eyes: Conjunctivae are normal. No scleral icterus.  Neck: Normal range of motion. Neck supple. No JVD present. No thyromegaly present.  Cardiovascular: Normal rate, regular rhythm, normal heart sounds and intact distal pulses.   Pulmonary/Chest: Effort normal and breath sounds normal. No respiratory distress. She has no wheezes. She has no rales. She exhibits no  tenderness.  Abdominal: Soft. Bowel sounds are normal. She exhibits no distension and no mass. There is no tenderness. There is no rebound and no guarding.  Musculoskeletal: Normal range of motion.  Lymphadenopathy:    She has no cervical adenopathy.  Neurological: She is alert and oriented to person, place, and time. She has normal strength. No cranial nerve deficit or sensory deficit. Coordination normal.  Skin: Skin is warm and dry.  Psychiatric: She has a normal mood and affect. Her behavior is normal. Judgment and thought content normal.  Nursing note and vitals reviewed.   Filed Vitals:   06/21/15 0907  BP: 142/72  Pulse: 98  Temp: 98 F (36.7 C)  Resp: 18         Assessment & Plan:    1. Complicated grieving -xanax prn -suggested grief counseling -if no improvement consider use of SSRI  2. Generalized headaches -given borderline high BP will use atenolol 25 mg nightly as headache prophylaxis -likely related to above grieving and lack of sleep  3. Gastroesophageal reflux disease, esophagitis presence not specified -omeprazole x2weeks

## 2015-06-27 ENCOUNTER — Encounter: Payer: BLUE CROSS/BLUE SHIELD | Admitting: Internal Medicine

## 2015-07-21 ENCOUNTER — Ambulatory Visit (INDEPENDENT_AMBULATORY_CARE_PROVIDER_SITE_OTHER): Payer: BLUE CROSS/BLUE SHIELD | Admitting: Internal Medicine

## 2015-07-21 VITALS — BP 148/76 | HR 72 | Temp 98.0°F | Resp 18 | Ht 64.5 in | Wt 180.0 lb

## 2015-07-21 DIAGNOSIS — F4329 Adjustment disorder with other symptoms: Secondary | ICD-10-CM

## 2015-07-21 DIAGNOSIS — Z23 Encounter for immunization: Secondary | ICD-10-CM

## 2015-07-21 DIAGNOSIS — Z634 Disappearance and death of family member: Principal | ICD-10-CM

## 2015-07-21 DIAGNOSIS — F4321 Adjustment disorder with depressed mood: Secondary | ICD-10-CM

## 2015-07-21 DIAGNOSIS — F4381 Prolonged grief disorder: Secondary | ICD-10-CM

## 2015-07-21 NOTE — Patient Instructions (Signed)
Please try red yeast rice to help you with your cholesterol.

## 2015-07-21 NOTE — Progress Notes (Signed)
   Subjective:    Patient ID: Alyssa Dudley, female    DOB: [birth year], 59 y.o.   MRN: [id number]  HPI  Patient presents to the office for reevaluation of elevated BP and complicated grief.  She reports that she had come to a conclusion that she needs to let go of her sons passing. She reports that she is sleeping now and she is able to eat and get back to her normal every day life.    Review of Systems  Constitutional: Negative for fever, chills and fatigue.  Respiratory: Negative for chest tightness and shortness of breath.   Cardiovascular: Negative for chest pain and palpitations.  Gastrointestinal: Negative for nausea, vomiting, diarrhea and constipation.  Psychiatric/Behavioral: Negative for confusion, dysphoric mood and agitation. The patient is not nervous/anxious.        Objective:   Physical Exam  Constitutional: She is oriented to person, place, and time. She appears well-developed and well-nourished. No distress.  HENT:  Head: Normocephalic.  Mouth/Throat: Oropharynx is clear and moist. No oropharyngeal exudate.  Eyes: Conjunctivae are normal. No scleral icterus.  Neck: Normal range of motion. Neck supple. No JVD present. No thyromegaly present.  Cardiovascular: Normal rate, regular rhythm, normal heart sounds and intact distal pulses.  Exam reveals no gallop and no friction rub.   No murmur heard. Pulmonary/Chest: Effort normal and breath sounds normal. No respiratory distress. She has no wheezes. She has no rales. She exhibits no tenderness.  Abdominal: Soft. Bowel sounds are normal. She exhibits no distension and no mass. There is no tenderness. There is no rebound and no guarding.  Musculoskeletal: Normal range of motion.  Lymphadenopathy:    She has no cervical adenopathy.  Neurological: She is alert and oriented to person, place, and time.  Skin: Skin is warm and dry. She is not diaphoretic.  Psychiatric: She has a normal mood and affect. Her behavior is normal.  Judgment and thought content normal.  Nursing note and vitals reviewed.   Filed Vitals:   07/21/15 1455  BP: 148/76  Pulse: 72  Temp: 98 F (36.7 C)  Resp: 18          Assessment & Plan:    1. Complicated grief -resolved -no medications necessary

## 2015-08-01 ENCOUNTER — Encounter: Payer: BLUE CROSS/BLUE SHIELD | Admitting: Nurse Practitioner

## 2015-08-02 ENCOUNTER — Encounter: Payer: Self-pay | Admitting: *Deleted

## 2015-08-03 ENCOUNTER — Encounter: Payer: Self-pay | Admitting: Nurse Practitioner

## 2015-08-03 ENCOUNTER — Ambulatory Visit (INDEPENDENT_AMBULATORY_CARE_PROVIDER_SITE_OTHER): Payer: BLUE CROSS/BLUE SHIELD | Admitting: Nurse Practitioner

## 2015-08-03 VITALS — BP 140/88 | HR 64 | Ht 66.0 in | Wt 181.6 lb

## 2015-08-03 DIAGNOSIS — I480 Paroxysmal atrial fibrillation: Secondary | ICD-10-CM

## 2015-08-03 DIAGNOSIS — I442 Atrioventricular block, complete: Secondary | ICD-10-CM

## 2015-08-03 NOTE — Patient Instructions (Signed)
Medication Instructions:  Your physician recommends that you continue on your current medications as directed. Please refer to the Current Medication list given to you today.   If you need a refill on your cardiac medications before your next appointment, please call your pharmacy.  Labwork: NONE ORDER TODAY    Testing/Procedures: NONE ORDER TODAY    Follow-Up:  Remote monitoring is used to monitor your Pacemaker of ICD from home. This monitoring reduces the number of office visits required to check your device to one time per year. It allows [place] to keep an eye on the functioning of your device to ensure it is working properly. You are scheduled for a device check from home on . 1 / 26  /17        You may send your transmission at any time that day. If you have a wireless device, the transmission will be sent automatically. After your physician reviews your transmission, you will receive a postcard with your next transmission date.  Your physician wants you to follow-up in: [place] will receive a reminder letter in the mail two months in advance. If you don't receive a letter, please call our office to schedule the follow-up appointment.     Any Other Special Instructions Will Be Listed Below (If Applicable).

## 2015-08-03 NOTE — Progress Notes (Signed)
Electrophysiology Office Note Date: 08/03/2015  ID:  Alyssa Dudley [birth year], MRN [id number]  PCP: Alyssa Richards, MD Electrophysiologist: [name]  CC: Pacemaker follow-up  Alyssa Dudley is a 59 y.o. female seen today for Dr [name].  She presents today for routine electrophysiology followup.  Since last being seen in our clinic, the patient reports doing very well.  Her son died unexpectedly in 03-May-2023 of this year and she struggled with that for several weeks.  She is graduating in May from [employer] with a bachelors in childcare. She denies chest pain, palpitations, dyspnea, PND, orthopnea, nausea, vomiting, dizziness, syncope.  Device History: STJ dual chamber PPM implanted 2014 for Mobitz II   Past Medical History  Diagnosis Date  . Second degree Mobitz II AV block   . Hyperlipidemia   . Vitamin D deficiency   . Paroxysmal atrial fibrillation ([place]) 6/15    detected on ppm interrogation   Past Surgical History  Procedure Laterality Date  . Breast cyst excision  1980  . Wrist surgery  2011    removed cyst  . Permanent pacemaker insertion N/A 01/13/2013    SJM Accent DR RF implanted by Dr [name] for mobitz II AV block    Current Outpatient Prescriptions  Medication Sig Dispense Refill  . diphenhydramine-acetaminophen (TYLENOL PM) 25-500 MG TABS tablet Take 1 tablet by mouth at bedtime as needed.    . Vitamin D, Ergocalciferol, (DRISDOL) 50000 UNITS CAPS capsule Take 1 capsule (50,000 Units total) by mouth every 7 (seven) days. 30 capsule 1  . aspirin EC 81 MG tablet Take 81 mg by mouth daily.     No current facility-administered medications for this visit.    Allergies:   Review of patient's allergies indicates no known allergies.   Social History: Social History   Social History  . Marital Status: Single    Spouse Name: N/A  . Number of Children: N/A  . Years of Education: N/A   Occupational History  . Not on file.   Social History Main Topics  .  Smoking status: Never Smoker   . Smokeless tobacco: Never Used  . Alcohol Use: No  . Drug Use: No  . Sexual Activity: Yes   Other Topics Concern  . Not on file   Social History Narrative    Family History: Family History  Problem Relation Age of Onset  . Colon cancer Sister   . Colon cancer Sister 19  . Cervical cancer Mother   . Hypertension Mother   . Diabetes Mother   . Heart disease Father   . Hyperlipidemia Father   . Prostate cancer Brother 12  . Multiple myeloma Son 12  . Hypertension Son   . Kidney disease Son   . Multiple myeloma Maternal Aunt      Review of Systems: All other systems reviewed and are otherwise negative except as noted above.   Physical Exam: VS:  BP 140/88 mmHg  Pulse 64  Ht '5\' 6"'  (1.676 m)  Wt 181 lb 9.6 oz (82.373 kg)  BMI 29.32 kg/m2 , BMI Body mass index is 29.32 kg/(m^2).  GEN- The patient is well appearing, alert and oriented x 3 today.   HEENT: normocephalic, atraumatic; sclera clear, conjunctiva pink; hearing intact; oropharynx clear; neck supple  Lungs- Clear to ausculation bilaterally, normal work of breathing.  No wheezes, rales, rhonchi Heart- Regular rate and rhythm (paced) GI- soft, non-tender, non-distended, bowel sounds present  Extremities- no clubbing, cyanosis, or edema; DP/PT/radial pulses 2+ bilaterally  MS- no significant deformity or atrophy Skin- warm and dry, no rash or lesion; PPM pocket well healed Psych- euthymic mood, full affect Neuro- strength and sensation are intact  PPM Interrogation- reviewed in detail today,  See PACEART report  EKG:  EKG is ordered today. The ekg ordered today shows sinus rhythm with V pacing  Recent Labs: 02/25/2015: ALT 14; BUN 11; Creat 0.72; Hemoglobin 13.9; Magnesium 2.0; Platelets 238; Potassium 5.0; Sodium 142; TSH 2.696   Wt Readings from Last 3 Encounters:  08/03/15 181 lb 9.6 oz (82.373 kg)  07/21/15 180 lb (81.647 kg)  06/21/15 180 lb (81.647 kg)     Other  studies Reviewed: Additional studies/ records that were reviewed today include:Dr Allred's office notes, PCP office notes  Assessment and Plan:  1.  Mobitz II heart block Normal PPM function See Pace Art report No changes today  2.  Paroxysmal atrial fibrillation AF burden by device interrogation today 0% No anticoagulation currently for CHADS2VASC of 1   Current medicines are reviewed at length with the patient today.   The patient does not have concerns regarding her medicines.  The following changes were made today:  none  Labs/ tests ordered today include: none    Disposition:   Follow up with [facility] transmissions, Dr [name] in 1 year   Signed, Chanetta Marshall, NP 08/03/2015 6:51 PM  [place] [address] [place] [address] [phone] (office) [phone] (fax

## 2015-08-12 ENCOUNTER — Emergency Department (HOSPITAL_COMMUNITY)
Admission: EM | Admit: 2015-08-12 | Discharge: 2015-08-13 | Disposition: A | Discharge status: Home / Self Care | Payer: BLUE CROSS/BLUE SHIELD | Attending: Emergency Medicine | Admitting: Emergency Medicine

## 2015-08-12 ENCOUNTER — Encounter (HOSPITAL_COMMUNITY): Payer: Self-pay

## 2015-08-12 ENCOUNTER — Emergency Department (HOSPITAL_COMMUNITY): Payer: BLUE CROSS/BLUE SHIELD

## 2015-08-12 DIAGNOSIS — Z79899 Other long term (current) drug therapy: Secondary | ICD-10-CM | POA: Diagnosis not present

## 2015-08-12 DIAGNOSIS — R51 Headache: Secondary | ICD-10-CM | POA: Insufficient documentation

## 2015-08-12 DIAGNOSIS — Z8679 Personal history of other diseases of the circulatory system: Secondary | ICD-10-CM | POA: Insufficient documentation

## 2015-08-12 DIAGNOSIS — E559 Vitamin D deficiency, unspecified: Secondary | ICD-10-CM | POA: Diagnosis not present

## 2015-08-12 DIAGNOSIS — R519 Headache, unspecified: Secondary | ICD-10-CM

## 2015-08-12 DIAGNOSIS — H538 Other visual disturbances: Secondary | ICD-10-CM | POA: Diagnosis not present

## 2015-08-12 MED ORDER — SODIUM CHLORIDE 0.9 % IV BOLUS (SEPSIS)
1000.0000 mL | Freq: Once | INTRAVENOUS | Status: AC
  Administered 2015-08-12: 1000 mL via INTRAVENOUS

## 2015-08-12 NOTE — ED Notes (Signed)
Pt complains of a right sided headache since 4am, she describes it as dull and constant all day, she had a numbness on the right side of her cheek and she was seeing spots today, she states that she didn't want to go to sleep tonight

## 2015-08-13 MED ORDER — IBUPROFEN 800 MG PO TABS: 800.0000 mg | ORAL_TABLET | Freq: Three times a day (TID) | ORAL | Status: DC | PRN

## 2015-08-13 MED ORDER — KETOROLAC TROMETHAMINE 30 MG/ML IJ SOLN
30.0000 mg | Freq: Once | INTRAMUSCULAR | Status: AC
  Administered 2015-08-13: 30 mg via INTRAVENOUS
  Filled 2015-08-13: qty 1

## 2015-08-13 MED ORDER — DIPHENHYDRAMINE HCL 50 MG/ML IJ SOLN
25.0000 mg | Freq: Once | INTRAMUSCULAR | Status: AC
  Administered 2015-08-13: 25 mg via INTRAVENOUS
  Filled 2015-08-13: qty 1

## 2015-08-13 MED ORDER — DEXAMETHASONE SODIUM PHOSPHATE 10 MG/ML IJ SOLN
10.0000 mg | Freq: Once | INTRAMUSCULAR | Status: AC
  Administered 2015-08-13: 10 mg via INTRAVENOUS
  Filled 2015-08-13: qty 1

## 2015-08-13 NOTE — Discharge Instructions (Signed)
Return here as needed.  Follow-up with her primary care doctor.  Your CT scan did not show any abnormalities.  Increase your fluid intake

## 2015-08-13 NOTE — ED Provider Notes (Signed)
CSN: [id number]     Arrival date & time 08/12/15  2025 History   First MD Initiated Contact with Patient 08/12/15 2248     Chief Complaint  Patient presents with  . Headache     (Consider location/radiation/quality/duration/timing/severity/associated sxs/prior Treatment) HPI Patient presents to the emergency department with right-sided posterior headache that is been ongoing for the last week, but got worse this evening.  She states that she did notice some tingling in the right side of her cheek.  She did have some blurry vision earlier in the week.  The patient states nothing seems make her condition better or worse.  The patient denies shortness of breath, weakness, back pain, neck pain, fever, cough, rhinitis with throat, rash, dizziness, lightheadedness, near syncope, nausea, vomiting, diarrhea, abdominal pain, or syncope.  Patient states she took Tylenol, which seemed to help minimally with her headache.  This is a gradual onset headache Past Medical History  Diagnosis Date  . Second degree Mobitz II AV block   . Hyperlipidemia   . Vitamin D deficiency   . Paroxysmal atrial fibrillation ([place]) 6/15    detected on ppm interrogation   Past Surgical History  Procedure Laterality Date  . Breast cyst excision  1980  . Wrist surgery  2011    removed cyst  . Permanent pacemaker insertion N/A 01/13/2013    SJM Acc[employer] DR RF implanted by Dr [name] for mobitz II AV block   Family History  Problem Relation Age of Onset  . Colon cancer Sister   . Colon cancer Sister 63  . Cervical cancer Mother   . Hypertension Mother   . Diabetes Mother   . Heart disease Father   . Hyperlipidemia Father   . Prostate cancer Brother 64  . Multiple myeloma Son 67  . Hypertension Son   . Kidney disease Son   . Multiple myeloma Maternal Aunt    Social History  Substance Use Topics  . Smoking status: Never Smoker   . Smokeless tobacco: Never Used  . Alcohol Use: No   OB History    No data available      Review of Systems All other systems negative except as documented in the HPI. All pertinent positives and negatives as reviewed in the HPI.   Allergies  Review of patient's allergies indicates no known allergies.  Home Medications   Prior to Admission medications   Medication Sig Start Date End Date Taking? Authorizing Provider  acetaminophen (TYLENOL) 500 MG tablet Take 500 mg by mouth every 6 (six) hours as needed for headache.   Yes Historical Provider, MD  atenolol (TENORMIN) 25 MG tablet Take 25 mg by mouth daily.   Yes Historical Provider, MD  diphenhydramine-acetaminophen (TYLENOL PM) 25-500 MG TABS tablet Take 1 tablet by mouth at bedtime as needed (pain sleep).    Yes Historical Provider, MD  Vitamin D, Ergocalciferol, (DRISDOL) 50000 UNITS CAPS capsule Take 1 capsule (50,000 Units total) by mouth every 7 (seven) days. Patient taking differently: Take 50,000 Units by mouth every 7 (seven) days. Monday 11/25/14  Yes Vicie Mutters, PA-C   BP 147/86 mmHg  Pulse 60  Temp(Src) 98.1 F (36.7 C) (Oral)  Resp 17  SpO2 100% Physical Exam  Constitutional: She is oriented to person, place, and time. She appears well-developed and well-nourished. No distress.  HENT:  Head: Normocephalic and atraumatic.  Mouth/Throat: Oropharynx is clear and moist.  Eyes: Pupils are equal, round, and reactive to light.  Neck: Normal range of  motion. Neck supple.  Cardiovascular: Normal rate, regular rhythm and normal heart sounds.  Exam reveals no gallop and no friction rub.   No murmur heard. Pulmonary/Chest: Effort normal and breath sounds normal. No respiratory distress. She has no wheezes.  Abdominal: Soft. Bowel sounds are normal. She exhibits no distension. There is no tenderness.  Neurological: She is alert and oriented to person, place, and time. She exhibits normal muscle tone. Coordination normal.  Skin: Skin is warm and dry. No rash noted. No erythema.  Psychiatric: She has a normal  mood and affect. Her behavior is normal.  Nursing note and vitals reviewed.   ED Course  Procedures (including critical care time) Labs Review Labs Reviewed - No data to display  Imaging Review Ct Head Wo Contrast  08/12/2015  CLINICAL DATA:  Right-sided headache since 4 a.m. EXAM: CT HEAD WITHOUT CONTRAST TECHNIQUE: Contiguous axial images were obtained from the base of the skull through the vertex without intravenous contrast. COMPARISON:  07/04/2009 FINDINGS: Ventricles are normal in size and configuration. There are no parenchymal masses or mass effect. There is no evidence of an infarct. There are no extra-axial masses or abnormal fluid collections. There is no intracranial hemorrhage. Visualized sinuses and mastoid air cells are clear. No skull lesion. IMPRESSION: Normal unenhanced CT scan of the brain. Electronically Signed   By: Lajean Manes M.D.   On: 08/12/2015 23:49   I have personally reviewed and evaluated these images and lab results as part of my medical decision-making.  Patient CT scan is negative.  She does not have any neurological deficits.  She is given IV fluids and headache cocktail.  She is advised to return here as needed.  Told to follow up with her primary care Dr. told to increase her fluid intake and rest as much as possible.  This headache does not appear to be of significant origin at this time.  She does not have any signs of stroke and no fine motor deficits   Dalia Heading, PA-C 08/13/15 0043  Leo Grosser, MD 08/13/15 [phone]

## 2015-08-16 ENCOUNTER — Other Ambulatory Visit: Payer: Self-pay | Admitting: Internal Medicine

## 2015-08-23 ENCOUNTER — Encounter: Payer: Self-pay | Admitting: Internal Medicine

## 2015-09-19 ENCOUNTER — Encounter: Payer: Self-pay | Admitting: Physician Assistant

## 2015-09-19 ENCOUNTER — Ambulatory Visit (INDEPENDENT_AMBULATORY_CARE_PROVIDER_SITE_OTHER): Payer: BLUE CROSS/BLUE SHIELD | Admitting: Physician Assistant

## 2015-09-19 VITALS — BP 130/80 | HR 74 | Temp 97.7°F | Resp 16 | Ht 66.0 in | Wt 181.0 lb

## 2015-09-19 DIAGNOSIS — R350 Frequency of micturition: Secondary | ICD-10-CM | POA: Diagnosis not present

## 2015-09-19 MED ORDER — SULFAMETHOXAZOLE-TRIMETHOPRIM 800-160 MG PO TABS: 1.0000 | ORAL_TABLET | Freq: Two times a day (BID) | ORAL | Status: DC

## 2015-09-19 NOTE — Progress Notes (Signed)
HPI: complains of UTI symptoms Onset 5 days ago, progressively worse associated with dysuria and small volume voiding with increased frequency denies hematuria, flank pain or fever The patient has a history of prior UTI  PMH: reviewed  ROS:  Gen.: No unexpected weight change, no night sweats Lungs: No cough or shortness of breath Cardiovascular: No palpitations or chest pain  PE: BP 130/80 mmHg  Pulse 74  Temp(Src) 97.7 F (36.5 C) (Temporal)  Resp 16  Ht 5\' 6"  (1.676 m)  Wt 181 lb (82.101 kg)  BMI 29.23 kg/m2  SpO2 99% General: No acute distress Lungs: Clear to auscultation Cardiovascular: Regular rate rhythm, no edema Abdomen: Mild to moderate discomfort of her suprapubic region, no flank tenderness to palpation  Lab Results  Component Value Date   WBC 4.7 02/25/2015   HGB 13.9 02/25/2015   HCT 41.5 02/25/2015   PLT 238 02/25/2015   GLUCOSE 79 02/25/2015   CHOL 170 02/25/2015   TRIG 54 02/25/2015   HDL 55 02/25/2015   LDLCALC 104* 02/25/2015   ALT 14 02/25/2015   AST 16 02/25/2015   NA 142 02/25/2015   K 5.0 02/25/2015   CL 105 02/25/2015   CREATININE 0.72 02/25/2015   BUN 11 02/25/2015   CO2 25 02/25/2015   TSH 2.696 02/25/2015   HGBA1C 5.7* 02/25/2015   MICROALBUR 0.2 02/25/2015    Assessment/Plan: UTI, classic symptoms with history of same  Empiric antibiotic x7 days Urine culture for identification and sensitivities Hydration recommended education provided

## 2015-09-20 LAB — URINALYSIS, ROUTINE W REFLEX MICROSCOPIC
Bilirubin Urine: NEGATIVE
Glucose, UA: NEGATIVE
Hgb urine dipstick: NEGATIVE
Ketones, ur: NEGATIVE
Nitrite: NEGATIVE
Protein, ur: NEGATIVE
Specific Gravity, Urine: 1.015 (ref 1.001–1.035)
pH: 7 (ref 5.0–8.0)

## 2015-09-20 LAB — URINALYSIS, MICROSCOPIC ONLY
Bacteria, UA: NONE SEEN [HPF]
Casts: NONE SEEN [LPF]
Crystals: NONE SEEN [HPF]
Yeast: NONE SEEN [HPF]

## 2015-09-21 LAB — URINE CULTURE: Colony Count: 7000

## 2015-10-03 ENCOUNTER — Encounter: Payer: Self-pay | Admitting: *Deleted

## 2015-10-13 ENCOUNTER — Other Ambulatory Visit: Payer: Self-pay | Admitting: Internal Medicine

## 2015-10-13 ENCOUNTER — Other Ambulatory Visit: Payer: Self-pay | Admitting: Urology

## 2015-10-13 ENCOUNTER — Telehealth: Payer: Self-pay | Admitting: Internal Medicine

## 2015-10-13 NOTE — Telephone Encounter (Signed)
Form completed and faxed back

## 2015-10-13 NOTE — Telephone Encounter (Signed)
New message    Request for surgical clearance:  1. What type of surgery is being performed? Lithotripsy  2. When is this surgery scheduled? 1.26.2017   3. Are there any medications that need to be held prior to surgery and how long? Need pace maker clearance   4. Name of physician performing surgery? Dr. Bernita Buffy   5. What is your office phone and fax number? [phone] / ext [phone]. fax [phone]

## 2015-10-27 ENCOUNTER — Encounter (HOSPITAL_COMMUNITY): Payer: Self-pay | Admitting: *Deleted

## 2015-11-01 ENCOUNTER — Other Ambulatory Visit (HOSPITAL_COMMUNITY): Payer: BLUE CROSS/BLUE SHIELD | Admitting: *Deleted

## 2015-11-01 ENCOUNTER — Encounter (HOSPITAL_COMMUNITY)
Admission: RE | Admit: 2015-11-01 | Discharge: 2015-11-01 | Disposition: A | Discharge status: Home / Self Care | Payer: BLUE CROSS/BLUE SHIELD | Source: Ambulatory Visit | Attending: Urology | Admitting: Urology

## 2015-11-01 DIAGNOSIS — I10 Essential (primary) hypertension: Secondary | ICD-10-CM | POA: Diagnosis not present

## 2015-11-01 DIAGNOSIS — K219 Gastro-esophageal reflux disease without esophagitis: Secondary | ICD-10-CM | POA: Diagnosis not present

## 2015-11-01 DIAGNOSIS — Z87442 Personal history of urinary calculi: Secondary | ICD-10-CM | POA: Diagnosis not present

## 2015-11-01 DIAGNOSIS — N200 Calculus of kidney: Secondary | ICD-10-CM | POA: Diagnosis present

## 2015-11-01 DIAGNOSIS — I4891 Unspecified atrial fibrillation: Secondary | ICD-10-CM | POA: Diagnosis not present

## 2015-11-01 DIAGNOSIS — Z79899 Other long term (current) drug therapy: Secondary | ICD-10-CM | POA: Diagnosis not present

## 2015-11-01 DIAGNOSIS — Z950 Presence of cardiac pacemaker: Secondary | ICD-10-CM | POA: Diagnosis not present

## 2015-11-01 DIAGNOSIS — N3000 Acute cystitis without hematuria: Secondary | ICD-10-CM | POA: Diagnosis not present

## 2015-11-02 ENCOUNTER — Encounter (HOSPITAL_COMMUNITY): Payer: Self-pay | Admitting: Anesthesiology

## 2015-11-02 NOTE — Anesthesia Preprocedure Evaluation (Addendum)
Anesthesia Evaluation  Patient identified by MRN, date of birth, ID band Patient awake    Reviewed: Allergy & Precautions, NPO status , Patient's Chart, lab work & pertinent test results  History of Anesthesia Complications (+) PONV and history of anesthetic complications  Airway Mallampati: II  TM Distance: >3 FB Neck ROM: Full    Dental no notable dental hx.    Pulmonary neg pulmonary ROS,    Pulmonary exam normal breath sounds clear to auscultation       Cardiovascular hypertension, Normal cardiovascular exam+ dysrhythmias + pacemaker  Rhythm:Regular Rate:Normal  H/O Mobitz type 2 heart block  Clearance on chart.  Cardiology office visit 08-03-15 reviewed.  pacemaker   Neuro/Psych negative neurological ROS  negative psych ROS   GI/Hepatic Neg liver ROS, GERD  ,  Endo/Other  negative endocrine ROS  Renal/GU negative Renal ROS  negative genitourinary   Musculoskeletal negative musculoskeletal ROS (+)   Abdominal   Peds negative pediatric ROS (+)  Hematology negative hematology ROS (+)   Anesthesia Other Findings   Reproductive/Obstetrics negative OB ROS                           Anesthesia Physical Anesthesia Plan  ASA: III  Anesthesia Plan: MAC   Post-op Pain Management:    Induction: Intravenous  Airway Management Planned:   Additional Equipment:   Intra-op Plan:   Post-operative Plan: Extubation in OR  Informed Consent: I have reviewed the patients History and Physical, chart, labs and discussed the procedure including the risks, benefits and alternatives for the proposed anesthesia with the patient or authorized representative who has indicated his/her understanding and acceptance.   Dental advisory given  Plan Discussed with: CRNA  Anesthesia Plan Comments:         Anesthesia Quick Evaluation

## 2015-11-02 NOTE — H&P (Signed)
Reason For Visit Kidney stone   Active Problems Problems  1. Acute cystitis without hematuria (N30.00) 2. Acute left flank pain (R10.9) 3. Right nephrolithiasis (N20.0)  History of Present Illness 59 yo divorced female referred by Shanon Rosser, PA for further evaluation of a 1.5cm non-obstructing Rt lower pole renal stone seen recently on Gallbladder u/s. One week of L flank pain, worse at night. No gross hematuria. No fever or chills. She has had 2 kidney stones in the past: 1st several years ago, and 2nd 1 yr later. Both have passed. No analysis of either stone. No family hx of stones. No sodas.She drinks 32 oz sweet tea, 3x/week. She drinks water at home. [place], 1x/week. She eats fast food ( [employer] 2x/day). She is P2, with 1 child deceased 2ndary multiple myeloma.   Past Medical History Problems  1. History of atrial fibrillation (Z86.79)  Surgical History Problems  1. History of Pacemaker Placement  Current Meds 1. Tylenol CAPS;  Therapy: (FWYOVZCH:[id number]) to Recorded 2. Vitamin D 50000 UNIT CAPS;  Therapy: (OINOMVEH:[id number]) to Recorded  Allergies Medication  1. No Known Drug Allergies  Family History Problems  1. Family history of Colon cancer : Sister 2. Family history of Deceased : Father 3. Family history of human immunodeficiency virus infection (Z83.1) : Brother 4. Family history of malignant neoplasm of cervix (Z80.49) : Mother 5. Family history of malignant neoplasm of prostate (E36.62) : Brother 6. Family history of multiple myeloma (Z80.7) : Son 7. Family history of tuberculosis (Z82.1) : Sister  Social History Problems    Caffeine use (F15.90)   Never a smoker   No alcohol use   Number of children   Occupation   Single  Review of Systems Genitourinary, constitutional, skin, eye, otolaryngeal, hematologic/lymphatic, cardiovascular, pulmonary, endocrine, musculoskeletal, gastrointestinal, neurological and psychiatric  system(s) were reviewed and pertinent findings if present are noted and are otherwise negative.  Genitourinary: hematuria.  Musculoskeletal: back pain and joint pain.  Neurological: headache.    Vitals Vital Signs [Data Includes: Last 1 Day]  Recorded: [id number] 09:48AM  Height: 5 ft 6 in Weight: 182 lb  BMI Calculated: 29.38 BSA Calculated: 1.92 Blood Pressure: 145 / 74 Heart Rate: 77  Physical Exam Constitutional: Well nourished and well developed . No acute distress.  ENT:. The ears and nose are normal in appearance.  Neck: The appearance of the neck is normal and no neck mass is present.  Pulmonary: No respiratory distress and normal respiratory rhythm and effort.  Cardiovascular: Heart rate and rhythm are normal . No peripheral edema.  Abdomen: The abdomen is flat. The abdomen is soft and nontender. No masses are palpated. moderate bilateral CVA tenderness. No hernias are palpable. No hepatosplenomegaly noted.  Genitourinary:  Chaperone Present: .  Examination of the external genitalia shows normal female external genitalia and no lesions. The urethra is normal in appearance and not tender. There is no urethral mass. Vaginal exam demonstrates no abnormalities. The adnexa are palpably normal. The bladder is non tender and not distended. The anus is normal on inspection. The perineum is normal on inspection.  Lymphatics: The femoral and inguinal nodes are not enlarged or tender.  Skin: Normal skin turgor, no visible rash and no visible skin lesions.  Neuro/Psych:. Mood and affect are appropriate.    Results/Data Urine [Data Includes: Last 1 Day]   [id number]  COLOR YELLOW   APPEARANCE CLEAR   SPECIFIC GRAVITY 1.025   pH 6.0   GLUCOSE NEGATIVE   BILIRUBIN NEGATIVE  KETONE NEGATIVE   BLOOD 1+   PROTEIN NEGATIVE   NITRITE NEGATIVE   LEUKOCYTE ESTERASE 1+   SQUAMOUS EPITHELIAL/HPF 6-10 HPF  WBC 10-20 WBC/HPF  RBC 0-2 RBC/HPF  BACTERIA MODERATE HPF  CRYSTALS NONE SEEN HPF   CASTS NONE SEEN LPF  Yeast NONE SEEN HPF   Assessment Assessed  1. Right nephrolithiasis (N20.0) 2. History of Acute left flank pain (R10.9) 3. Acute left flank pain (R10.9) 4. Acute cystitis without hematuria (N30.00)  60 yo female with 1.5cm Right lower pole stone, with 368 HU, and 7.2 cm from stone to flank. She will have lithotripsy. She will have litholink, and will take cipro for probable cystitis pre-op. She will probably need to change her diet, and we have discussed this. We will await her stone analysis, and litholink before discussing stone prevention.   Plan Health Maintenance  1. UA With REFLEX; [Do Not Release]; Status:Resulted - Requires Verification;   Done:  [id number] 09:34AM Right nephrolithiasis  2. Start: Ciprofloxacin HCl - 500 MG Oral Tablet; TAKE 1 TABLET BID 3. KUB; Status:Resulted - Requires Verification;   Done: [id number] 10:04AM 4. Litholink Stone Risk-Urine; Status:Hold For - [employer];  Requested NEY:[id number];  5. Follow-up Schedule Surgery Office  Follow-up  Status: Hold For - Appointment   Requested for: [id number]  1. Urine for c/s  2. cipro 571m bid x 7 days  3. lithotripsy schedule.   4. Litholink   Discussion/Summary cc: SShanon Rosser PA     Amendment  cc: Dr. MAdalberto Ill    1 Amended By: SCarolan Clines Oct 12 2015 11:03 AM EST  Signatures Electronically signed by : SCarolan Clines M.D.; Oct 12 2015 11:02AM EST Electronically signed by : SCarolan Clines M.D.; Oct 12 2015 11:03AM EST

## 2015-11-03 ENCOUNTER — Ambulatory Visit (HOSPITAL_COMMUNITY)
Admission: RE | Admit: 2015-11-03 | Discharge: 2015-11-03 | Disposition: A | Discharge status: Home / Self Care | Payer: BLUE CROSS/BLUE SHIELD | Source: Ambulatory Visit | Attending: Urology | Admitting: Urology

## 2015-11-03 ENCOUNTER — Ambulatory Visit (HOSPITAL_COMMUNITY): Payer: BLUE CROSS/BLUE SHIELD | Admitting: Registered Nurse

## 2015-11-03 ENCOUNTER — Ambulatory Visit (HOSPITAL_COMMUNITY): Payer: BLUE CROSS/BLUE SHIELD

## 2015-11-03 ENCOUNTER — Encounter (HOSPITAL_COMMUNITY): Payer: Self-pay | Admitting: General Practice

## 2015-11-03 ENCOUNTER — Encounter (HOSPITAL_COMMUNITY)
Admission: RE | Disposition: A | Discharge status: Home / Self Care | Payer: Self-pay | Source: Ambulatory Visit | Attending: Urology

## 2015-11-03 ENCOUNTER — Ambulatory Visit (INDEPENDENT_AMBULATORY_CARE_PROVIDER_SITE_OTHER): Payer: BLUE CROSS/BLUE SHIELD | Admitting: *Deleted

## 2015-11-03 DIAGNOSIS — Z950 Presence of cardiac pacemaker: Secondary | ICD-10-CM | POA: Insufficient documentation

## 2015-11-03 DIAGNOSIS — I4891 Unspecified atrial fibrillation: Secondary | ICD-10-CM | POA: Insufficient documentation

## 2015-11-03 DIAGNOSIS — K219 Gastro-esophageal reflux disease without esophagitis: Secondary | ICD-10-CM | POA: Insufficient documentation

## 2015-11-03 DIAGNOSIS — I442 Atrioventricular block, complete: Secondary | ICD-10-CM | POA: Diagnosis not present

## 2015-11-03 DIAGNOSIS — I10 Essential (primary) hypertension: Secondary | ICD-10-CM | POA: Insufficient documentation

## 2015-11-03 DIAGNOSIS — Z87442 Personal history of urinary calculi: Secondary | ICD-10-CM | POA: Insufficient documentation

## 2015-11-03 DIAGNOSIS — N200 Calculus of kidney: Secondary | ICD-10-CM | POA: Insufficient documentation

## 2015-11-03 DIAGNOSIS — Z79899 Other long term (current) drug therapy: Secondary | ICD-10-CM | POA: Insufficient documentation

## 2015-11-03 DIAGNOSIS — N3000 Acute cystitis without hematuria: Secondary | ICD-10-CM | POA: Insufficient documentation

## 2015-11-03 HISTORY — DX: Nausea with vomiting, unspecified: Z98.890

## 2015-11-03 HISTORY — DX: Nausea with vomiting, unspecified: R11.2

## 2015-11-03 SURGERY — LITHOTRIPSY, ESWL
Anesthesia: Monitor Anesthesia Care | Laterality: Right

## 2015-11-03 MED ORDER — FENTANYL CITRATE (PF) 100 MCG/2ML IJ SOLN
INTRAMUSCULAR | Status: DC | PRN
Start: 2015-11-03 — End: 2015-11-03
  Administered 2015-11-03 (×4): 25 ug via INTRAVENOUS

## 2015-11-03 MED ORDER — PROPOFOL 10 MG/ML IV BOLUS
INTRAVENOUS | Status: AC
  Filled 2015-11-03: qty 40

## 2015-11-03 MED ORDER — SODIUM CHLORIDE 0.9 % IV SOLN: INTRAVENOUS | Status: DC

## 2015-11-03 MED ORDER — LIDOCAINE HCL (CARDIAC) 20 MG/ML IV SOLN
INTRAVENOUS | Status: DC | PRN
  Administered 2015-11-03: 100 mg via INTRAVENOUS

## 2015-11-03 MED ORDER — MIDAZOLAM HCL 2 MG/2ML IJ SOLN
INTRAMUSCULAR | Status: AC
  Filled 2015-11-03: qty 2

## 2015-11-03 MED ORDER — LIDOCAINE HCL (CARDIAC) 20 MG/ML IV SOLN
INTRAVENOUS | Status: AC
  Filled 2015-11-03: qty 5

## 2015-11-03 MED ORDER — LACTATED RINGERS IV SOLN
Freq: Once | INTRAVENOUS | Status: AC
  Administered 2015-11-03: 07:00:00 via INTRAVENOUS

## 2015-11-03 MED ORDER — CIPROFLOXACIN HCL 500 MG PO TABS
500.0000 mg | ORAL_TABLET | ORAL | Status: AC
  Administered 2015-11-03: 500 mg via ORAL
  Filled 2015-11-03: qty 1

## 2015-11-03 MED ORDER — MIDAZOLAM HCL 5 MG/5ML IJ SOLN
INTRAMUSCULAR | Status: DC | PRN
  Administered 2015-11-03: 2 mg via INTRAVENOUS

## 2015-11-03 MED ORDER — PROPOFOL 10 MG/ML IV BOLUS
INTRAVENOUS | Status: AC
  Filled 2015-11-03: qty 20

## 2015-11-03 MED ORDER — DIPHENHYDRAMINE HCL 25 MG PO CAPS: 25.0000 mg | ORAL_CAPSULE | ORAL | Status: DC

## 2015-11-03 MED ORDER — ONDANSETRON HCL 4 MG/2ML IJ SOLN
INTRAMUSCULAR | Status: DC | PRN
Start: 2015-11-03 — End: 2015-11-03
  Administered 2015-11-03: 4 mg via INTRAVENOUS

## 2015-11-03 MED ORDER — DIAZEPAM 5 MG PO TABS: 10.0000 mg | ORAL_TABLET | ORAL | Status: DC

## 2015-11-03 MED ORDER — PROPOFOL 500 MG/50ML IV EMUL
INTRAVENOUS | Status: DC | PRN
  Administered 2015-11-03: 75 ug/kg/min via INTRAVENOUS

## 2015-11-03 MED ORDER — FENTANYL CITRATE (PF) 100 MCG/2ML IJ SOLN
INTRAMUSCULAR | Status: AC
  Filled 2015-11-03: qty 2

## 2015-11-03 MED ORDER — BENZONATATE 100 MG PO CAPS
100.0000 mg | ORAL_CAPSULE | Freq: Once | ORAL | Status: DC
  Filled 2015-11-03: qty 1

## 2015-11-03 MED ORDER — LACTATED RINGERS IV SOLN
INTRAVENOUS | Status: DC | PRN
  Administered 2015-11-03: 08:00:00 via INTRAVENOUS

## 2015-11-03 NOTE — Anesthesia Postprocedure Evaluation (Signed)
Anesthesia Post Note  Patient: Alyssa Dudley  Procedure(s) Performed: Procedure(s) (LRB): RIGHT EXTRACORPOREAL SHOCK WAVE LITHOTRIPSY (ESWL) (Right)  Patient location during evaluation: PACU Anesthesia Type: MAC Level of consciousness: awake and alert Pain management: pain level controlled Vital Signs Assessment: post-procedure vital signs reviewed and stable Respiratory status: spontaneous breathing, nonlabored ventilation, respiratory function stable and patient connected to nasal cannula oxygen Cardiovascular status: stable and blood pressure returned to baseline Anesthetic complications: no    Last Vitals:  Filed Vitals:   11/03/15 0915 11/03/15 1005  BP: 161/96 161/90  Pulse: 60 60  Temp: 36.4 C   Resp: 16 18    Last Pain:  Filed Vitals:   11/03/15 1009  PainSc: 8                  Jordell Outten J

## 2015-11-03 NOTE — Progress Notes (Signed)
Remote pacemaker transmission.   

## 2015-11-03 NOTE — Transfer of Care (Signed)
Immediate Anesthesia Transfer of Care Note  Patient: Alyssa Dudley  Procedure(s) Performed: Procedure(s): RIGHT EXTRACORPOREAL SHOCK WAVE LITHOTRIPSY (ESWL) (Right)  Patient Location: PACU and Short Stay  Anesthesia Type:MAC  Level of Consciousness: awake, alert , oriented and patient cooperative  Airway & Oxygen Therapy: Patient Spontanous Breathing  Post-op Assessment: Report given to RN, Post -op Vital signs reviewed and stable and Patient moving all extremities  Post vital signs: Reviewed and stable  Last Vitals:  Filed Vitals:   11/03/15 0559  BP: 143/75  Pulse: 61  Temp: 36.8 C  Resp: 16    Complications: No apparent anesthesia complications

## 2015-11-03 NOTE — Discharge Instructions (Signed)
Dietary Guidelines to Help Prevent Kidney Stones Your risk of kidney stones can be decreased by adjusting the foods you eat. The most important thing you can do is drink enough fluid. You should drink enough fluid to keep your urine clear or pale yellow. The following guidelines provide specific information for the type of kidney stone you have had. GUIDELINES ACCORDING TO TYPE OF KIDNEY STONE Calcium Oxalate Kidney Stones  Reduce the amount of salt you eat. Foods that have a lot of salt cause your body to release excess calcium into your urine. The excess calcium can combine with a substance called oxalate to form kidney stones.  Reduce the amount of animal protein you eat if the amount you eat is excessive. Animal protein causes your body to release excess calcium into your urine. Ask your dietitian how much protein from animal sources you should be eating.  Avoid foods that are high in oxalates. If you take vitamins, they should have less than 500 mg of vitamin C. Your body turns vitamin C into oxalates. You do not need to avoid fruits and vegetables high in vitamin C. Calcium Phosphate Kidney Stones  Reduce the amount of salt you eat to help prevent the release of excess calcium into your urine.  Reduce the amount of animal protein you eat if the amount you eat is excessive. Animal protein causes your body to release excess calcium into your urine. Ask your dietitian how much protein from animal sources you should be eating.  Get enough calcium from food or take a calcium supplement (ask your dietitian for recommendations). Food sources of calcium that do not increase your risk of kidney stones include:  Broccoli.  Dairy products, such as cheese and yogurt.  Pudding. Uric Acid Kidney Stones  Do not have more than 6 oz of animal protein per day. FOOD SOURCES Animal Protein Sources  Meat (all types).  Poultry.  Eggs.  Fish, seafood. Foods High in Salt  Salt seasonings.  Soy  sauce.  Teriyaki sauce.  Cured and processed meats.  Salted crackers and snack foods.  Fast food.  Canned soups and most canned foods. Foods High in Oxalates  Grains:  Amaranth.  Barley.  Grits.  Wheat germ.  Bran.  Buckwheat flour.  All bran cereals.  Pretzels.  Whole wheat bread.  Vegetables:  Beans (wax).  Beets and beet greens.  Collard greens.  Eggplant.  Escarole.  Leeks.  Okra.  Parsley.  Rutabagas.  Spinach.  Swiss chard.  Tomato paste.  Fried potatoes.  Sweet potatoes.  Fruits:  Red currants.  Figs.  Kiwi.  Rhubarb.  Meat and Other Protein Sources:  Beans (dried).  Soy burgers and other soybean products.  Miso.  Nuts (peanuts, almonds, pecans, cashews, hazelnuts).  Nut butters.  Sesame seeds and tahini (paste made of sesame seeds).  Poppy seeds.  Beverages:  Chocolate drink mixes.  Soy milk.  Instant iced tea.  Juices made from high-oxalate fruits or vegetables.  Other:  Carob.  Chocolate.  Fruitcake.  Marmalades.   This information is not intended to replace advice given to you by your health care provider. Make sure you discuss any questions you have with your health care provider.   Document Released: 01/19/2011 Document Revised: 09/29/2013 Document Reviewed: 08/21/2013 Elsevier Interactive Patient Education 2016 [employer]  

## 2015-11-03 NOTE — Interval H&P Note (Signed)
History and Physical Interval Note:  11/03/2015 7:58 AM  Alyssa Dudley  has presented today for surgery, with the diagnosis of RIGHT LOWER POLE STONE  The various methods of treatment have been discussed with the patient and family. After consideration of risks, benefits and other options for treatment, the patient has consented to  Procedure(s): RIGHT EXTRACORPOREAL SHOCK WAVE LITHOTRIPSY (ESWL) (Right) as a surgical intervention .  The patient's history has been reviewed, patient examined, no change in status, stable for surgery.  I have reviewed the patient's chart and labs.  Questions were answered to the patient's satisfaction.     Braidyn Scorsone I Grasiela Jonsson

## 2015-11-16 LAB — CUP PACEART REMOTE DEVICE CHECK
Battery Remaining Longevity: 140 mo
Battery Remaining Percentage: 95.5 %
Battery Voltage: 2.99 V
Brady Statistic AP VP Percent: 44 %
Brady Statistic AP VS Percent: 1 %
Brady Statistic AS VP Percent: 54 %
Brady Statistic AS VS Percent: 1 %
Brady Statistic RA Percent Paced: 44 %
Brady Statistic RV Percent Paced: 98 %
Date Time Interrogation Session: 20170126070954
Implantable Lead Implant Date: 20140408
Implantable Lead Implant Date: 20140408
Implantable Lead Location: 753859
Implantable Lead Location: 753860
Implantable Lead Model: 1944
Implantable Lead Model: 1948
Lead Channel Impedance Value: 530 Ohm
Lead Channel Impedance Value: 640 Ohm
Lead Channel Pacing Threshold Amplitude: 0.5 V
Lead Channel Pacing Threshold Amplitude: 0.5 V
Lead Channel Pacing Threshold Pulse Width: 0.4 ms
Lead Channel Pacing Threshold Pulse Width: 0.4 ms
Lead Channel Sensing Intrinsic Amplitude: 12 mV
Lead Channel Sensing Intrinsic Amplitude: 3.4 mV
Lead Channel Setting Pacing Amplitude: 0.75 V
Lead Channel Setting Pacing Amplitude: 1.5 V
Lead Channel Setting Pacing Pulse Width: 0.4 ms
Lead Channel Setting Sensing Sensitivity: 2 mV
Pulse Gen Model: 2210
Pulse Gen Serial Number: 7457437

## 2015-11-18 ENCOUNTER — Encounter: Payer: Self-pay | Admitting: Cardiology

## 2015-12-02 ENCOUNTER — Encounter: Payer: Self-pay | Admitting: Cardiology

## 2015-12-07 ENCOUNTER — Other Ambulatory Visit: Payer: Self-pay | Admitting: Physician Assistant

## 2015-12-14 ENCOUNTER — Ambulatory Visit (INDEPENDENT_AMBULATORY_CARE_PROVIDER_SITE_OTHER): Payer: BLUE CROSS/BLUE SHIELD | Admitting: Internal Medicine

## 2015-12-14 ENCOUNTER — Encounter: Payer: Self-pay | Admitting: Internal Medicine

## 2015-12-14 VITALS — BP 118/82 | HR 80 | Temp 98.2°F | Resp 18 | Ht 66.0 in | Wt 179.0 lb

## 2015-12-14 DIAGNOSIS — R21 Rash and other nonspecific skin eruption: Secondary | ICD-10-CM | POA: Diagnosis not present

## 2015-12-14 MED ORDER — TRIAMCINOLONE ACETONIDE 0.1 % EX CREA: 1.0000 "application " | TOPICAL_CREAM | Freq: Three times a day (TID) | CUTANEOUS | Status: DC

## 2015-12-14 MED ORDER — PREDNISONE 20 MG PO TABS: ORAL_TABLET | ORAL | Status: DC

## 2015-12-14 MED ORDER — DOXYCYCLINE HYCLATE 100 MG PO CAPS: 100.0000 mg | ORAL_CAPSULE | Freq: Two times a day (BID) | ORAL | Status: DC

## 2015-12-14 NOTE — Progress Notes (Signed)
   Subjective:    Patient ID: Alyssa Dudley, female    DOB: [birth year], 60 y.o.   MRN: [id number]  Rash Pertinent negatives include no fatigue, fever or vomiting.   Patient presents to the office for evaluation of a rash on her bottom x 4 days.  She reports that the rash is like blisters and it is really itchy.  She reports that she was afraid that it might spread.  She did try neosporin on it but that didn't help.  She reports that this is on the right buttock.  NO new soaps, lotions, detergents.  No new medications.  She did buy some new panties and she has been wearing them for a week.  She reports no other new clothes.     Review of Systems  Constitutional: Negative for fever, chills and fatigue.  Gastrointestinal: Negative for nausea and vomiting.  Skin: Positive for rash.       Objective:   Physical Exam  Constitutional: She appears well-developed and well-nourished. No distress.  HENT:  Head: Normocephalic.  Mouth/Throat: Oropharynx is clear and moist. No oropharyngeal exudate.  Eyes: Conjunctivae are normal. No scleral icterus.  Neck: Normal range of motion. Neck supple. No JVD present. No thyromegaly present.  Cardiovascular: Normal rate, regular rhythm, normal heart sounds and intact distal pulses.  Exam reveals no gallop and no friction rub.   No murmur heard. Pulmonary/Chest: Effort normal and breath sounds normal. No respiratory distress. She has no wheezes. She has no rales. She exhibits no tenderness.  Genitourinary:  Rash to the left buttocks at the superior region of the gluteal cleft.  4 discreet pustules that appear to fluid filled without redness or tenderness to palpation.    Lymphadenopathy:    She has no cervical adenopathy.  Skin: She is not diaphoretic.  Psychiatric: She has a normal mood and affect. Her behavior is normal. Judgment and thought content normal.  Nursing note and vitals reviewed.   Filed Vitals:   12/14/15 1435  BP: 118/82  Pulse: 80   Temp: 98.2 F (36.8 C)  Resp: 18         Assessment & Plan:    1. Rash and nonspecific skin eruption -prednisone -doxycycline -kenalog

## 2016-02-02 ENCOUNTER — Encounter: Payer: BLUE CROSS/BLUE SHIELD | Admitting: *Deleted

## 2016-02-02 ENCOUNTER — Telehealth: Payer: Self-pay | Admitting: Cardiology

## 2016-02-02 NOTE — Telephone Encounter (Signed)
Spoke with pt and reminded pt of remote transmission that is due today. Pt verbalized understanding.   

## 2016-02-03 ENCOUNTER — Encounter: Payer: Self-pay | Admitting: Cardiology

## 2016-02-06 ENCOUNTER — Ambulatory Visit (INDEPENDENT_AMBULATORY_CARE_PROVIDER_SITE_OTHER): Payer: BLUE CROSS/BLUE SHIELD | Admitting: *Deleted

## 2016-02-06 DIAGNOSIS — I442 Atrioventricular block, complete: Secondary | ICD-10-CM

## 2016-02-06 NOTE — Progress Notes (Signed)
Remote pacemaker transmission.   

## 2016-02-27 ENCOUNTER — Encounter: Payer: Self-pay | Admitting: Physician Assistant

## 2016-02-27 ENCOUNTER — Ambulatory Visit (INDEPENDENT_AMBULATORY_CARE_PROVIDER_SITE_OTHER): Payer: BLUE CROSS/BLUE SHIELD | Admitting: Physician Assistant

## 2016-02-27 ENCOUNTER — Other Ambulatory Visit: Payer: Self-pay

## 2016-02-27 VITALS — BP 132/80 | HR 60 | Temp 97.5°F | Resp 16 | Ht 65.0 in | Wt 178.0 lb

## 2016-02-27 DIAGNOSIS — Z0000 Encounter for general adult medical examination without abnormal findings: Secondary | ICD-10-CM

## 2016-02-27 DIAGNOSIS — E559 Vitamin D deficiency, unspecified: Secondary | ICD-10-CM | POA: Diagnosis not present

## 2016-02-27 DIAGNOSIS — I441 Atrioventricular block, second degree: Secondary | ICD-10-CM

## 2016-02-27 DIAGNOSIS — D649 Anemia, unspecified: Secondary | ICD-10-CM

## 2016-02-27 DIAGNOSIS — Z0001 Encounter for general adult medical examination with abnormal findings: Secondary | ICD-10-CM

## 2016-02-27 DIAGNOSIS — I10 Essential (primary) hypertension: Secondary | ICD-10-CM

## 2016-02-27 DIAGNOSIS — R7309 Other abnormal glucose: Secondary | ICD-10-CM

## 2016-02-27 DIAGNOSIS — D126 Benign neoplasm of colon, unspecified: Secondary | ICD-10-CM

## 2016-02-27 DIAGNOSIS — Z8711 Personal history of peptic ulcer disease: Secondary | ICD-10-CM

## 2016-02-27 DIAGNOSIS — Z79899 Other long term (current) drug therapy: Secondary | ICD-10-CM

## 2016-02-27 DIAGNOSIS — K219 Gastro-esophageal reflux disease without esophagitis: Secondary | ICD-10-CM

## 2016-02-27 DIAGNOSIS — Z800 Family history of malignant neoplasm of digestive organs: Secondary | ICD-10-CM

## 2016-02-27 DIAGNOSIS — E669 Obesity, unspecified: Secondary | ICD-10-CM

## 2016-02-27 DIAGNOSIS — E785 Hyperlipidemia, unspecified: Secondary | ICD-10-CM

## 2016-02-27 DIAGNOSIS — K5900 Constipation, unspecified: Secondary | ICD-10-CM

## 2016-02-27 DIAGNOSIS — I480 Paroxysmal atrial fibrillation: Secondary | ICD-10-CM

## 2016-02-27 LAB — CBC WITH DIFFERENTIAL/PLATELET
Basophils Absolute: 0 cells/uL (ref 0–200)
Basophils Relative: 0 %
Eosinophils Absolute: 117 cells/uL (ref 15–500)
Eosinophils Relative: 3 %
HCT: 40.5 % (ref 35.0–45.0)
Hemoglobin: 13.5 g/dL (ref 11.7–15.5)
Lymphocytes Relative: 45 %
Lymphs Abs: 1755 cells/uL (ref 850–3900)
MCH: 30.3 pg (ref 27.0–33.0)
MCHC: 33.3 g/dL (ref 32.0–36.0)
MCV: 91 fL (ref 80.0–100.0)
MPV: 9.8 fL (ref 7.5–12.5)
Monocytes Absolute: 390 cells/uL (ref 200–950)
Monocytes Relative: 10 %
Neutro Abs: 1638 cells/uL (ref 1500–7800)
Neutrophils Relative %: 42 %
Platelets: 242 10*3/uL (ref 140–400)
RBC: 4.45 MIL/uL (ref 3.80–5.10)
RDW: 14.1 % (ref 11.0–15.0)
WBC: 3.9 10*3/uL (ref 3.8–10.8)

## 2016-02-27 LAB — URINALYSIS, MICROSCOPIC ONLY
Casts: NONE SEEN [LPF]
Crystals: NONE SEEN [HPF]
Yeast: NONE SEEN [HPF]

## 2016-02-27 LAB — URINALYSIS, ROUTINE W REFLEX MICROSCOPIC
Bilirubin Urine: NEGATIVE
Glucose, UA: NEGATIVE
Hgb urine dipstick: NEGATIVE
Ketones, ur: NEGATIVE
Nitrite: NEGATIVE
Protein, ur: NEGATIVE
Specific Gravity, Urine: 1.024 (ref 1.001–1.035)
pH: 5.5 (ref 5.0–8.0)

## 2016-02-27 MED ORDER — TRIAMCINOLONE ACETONIDE 0.1 % EX CREA: 1.0000 "application " | TOPICAL_CREAM | Freq: Three times a day (TID) | CUTANEOUS | Status: DC

## 2016-02-27 MED ORDER — VITAMIN D (ERGOCALCIFEROL) 1.25 MG (50000 UNIT) PO CAPS: ORAL_CAPSULE | ORAL | Status: DC

## 2016-02-27 NOTE — Progress Notes (Signed)
Complete Physical  Assessment and Plan: 1. Essential hypertension - continue medications, DASH diet, exercise and monitor at home. Call if greater than 130/80. - CBC with Differential/Platelet - BASIC METABOLIC PANEL WITH GFR - TSH - Hepatic function panel - Urinalysis, Routine w reflex microscopic - Microalbumin / creatinine urine ratio  2. Hyperlipidemia -continue medications, check lipids, decrease fatty foods, increase activity.  - Lipid panel  3. Obesity Obesity with co morbidities- long discussion about weight loss, diet, and exercise  4. Mobitz type 2 second degree and high-grade heart block Has pacemaker   5. Paroxysmal atrial fibrillation Not on coagulation, following with Cardio  6. Gastroesophageal reflux disease without esophagitis Continue PPI/H2 blocker, diet discussed  7. Abnormal glucose Discussed general issues about diabetes pathophysiology and management., Educational material distributed., Suggested low cholesterol diet., Encouraged aerobic exercise., Discussed foot care., Reminded to get yearly retinal exam. - Hemoglobin A1c - Insulin, fasting  8. Vitamin D deficiency May need to increase to 3 times a week.  - Vit D  25 hydroxy (rtn osteoporosis monitoring)  9. Medication management - Magnesium  10. History of peptic ulcer disease Avoid NSAIDS  11. Family history of malignant neoplasm of gastrointestinal tract monitor  12. Constipation, unspecified constipation type Better with increase exercise  13. Benign neoplasm of colon Due 2018  14. Routine general medical examination at a health care facility  15. Grief reaction Suggest follow up counselor   Discussed med's effects and SE's. Screening labs and tests as requested with regular follow-up as recommended. Over 40 minutes of exam, counseling, chart review, and critical decision making was performed this visit.   HPI  60 y.o. female  presents for a complete physical.  Her blood  pressure has been controlled at home, today their BP is BP: 132/80 mmHg She does workout, walking and running and done a 10 K.  She denies chest pain, shortness of breath, dizziness.  She has Afib and mobitz type 2, follows with Dr. [name] and Leonides Schanz, low risk CHADSVASC 1 and not on anticoagulation.  She is not on cholesterol medication, states that last time she was not on it. Her cholesterol is not at goal. The cholesterol last visit was:   Lab Results  Component Value Date   CHOL 170 02/25/2015   HDL 55 02/25/2015   LDLCALC 104* 02/25/2015   TRIG 54 02/25/2015   CHOLHDL 3.1 02/25/2015    She has been working on diet and exercise for prediabetes and has done a good job of decreasing her A1C, and denies paresthesia of the feet, polydipsia, polyuria and visual disturbances. Last A1C in the office was:  Lab Results  Component Value Date   HGBA1C 5.7* 02/25/2015  Patient is on Vitamin D supplement, 50,000 IU only once a week.   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 Graduated from [employer] in May. She states she has not been sleeping well.   BMI is Body mass index is 29.62 kg/(m^2)., she is working on diet and exercise. Wt Readings from Last 3 Encounters:  02/27/16 178 lb (80.74 kg)  12/14/15 179 lb (81.194 kg)  11/03/15 180 lb 8 oz (81.874 kg)     Current Medications:  Current Outpatient Prescriptions on File Prior to Visit  Medication Sig Dispense Refill  . diphenhydramine-acetaminophen (TYLENOL PM) 25-500 MG TABS tablet Take 1 tablet by mouth at bedtime as  needed.    . triamcinolone cream (KENALOG) 0.1 % Apply 1 application topically 3 (three) times daily. 30 g 0  . Vitamin D, Ergocalciferol, (DRISDOL) 50000 units CAPS capsule TAKE ONE CAPSULE BY MOUTH EVERY 7 DAYS. 30 capsule 3   No current facility-administered medications on file prior to visit.   Health  Maintenance:   Immunization History  Administered Date(s) Administered  . Influenza Split 06/30/2013, 07/20/2014, 07/21/2015  . PPD Test 02/22/2014, 05/10/2015  . Tdap 10/09/2011   Colonoscopy: 10/15/11 due 2018 with polyps and FHX Mammo: 06/22/2015 BMD: 2015 WNL will wait until closer to 66 Pap/ Pelvic: 2015 WNL- repeat 2018 AB [place] 2011 CXR 2014 Echo 01/2013  EYE: Dr. Peter Garter yearly WNL Dentist: Dr. Marga Hoots 13month IMMUNIZATIONS: Tdap:2013 Pneumovax n/a: Prevnar 13: due at 89Zostavax:n/a Influenza:2016  Patient Care Team: WUnk Pinto MD as PCP - General (Internal Medicine) DSable Feil MD as Consulting Physician (Gastroenterology) TCamillo Flaming OD as Referring Physician (Optometry) PFay Records MD as Consulting Physician (Cardiology) GDaryll Brod MD as Consulting Physician (Orthopedic Surgery)  Allergies: No Known Allergies Medical History:  Past Medical History  Diagnosis Date  . Second degree Mobitz II AV block   . Hyperlipidemia   . Vitamin D deficiency   . Paroxysmal atrial fibrillation ([place] 6/15    detected on ppm interrogation  . PONV (postoperative nausea and vomiting)     has vomitted in past   Surgical History:  Past Surgical History  Procedure Laterality Date  . Breast cyst excision  1980  . Wrist surgery  2011    removed cyst  . Permanent pacemaker insertion N/A 01/13/2013    SJM Accent DR RF implanted by Dr ARayann Hemanfor mobitz II AV block   Family History:  Family History  Problem Relation Age of Onset  . Colon cancer Sister   . Colon cancer Sister 563 . Cervical cancer Mother   . Hypertension Mother   . Diabetes Mother   . Heart disease Father   . Hyperlipidemia Father   . Prostate cancer Brother 615 . Multiple myeloma Son 342 . Hypertension Son   . Kidney disease Son   . Multiple myeloma Maternal Aunt    Social History:  Social History  Substance Use Topics  . Smoking status: Never Smoker   . Smokeless tobacco: Never Used  .  Alcohol Use: No   Review of Systems: Review of Systems  Constitutional: Negative.   HENT: Negative.   Eyes: Negative.   Respiratory: Negative.   Cardiovascular: Negative.   Gastrointestinal: Negative.   Genitourinary: Negative.   Musculoskeletal: Negative.   Skin: Negative.   Neurological: Negative.   Endo/Heme/Allergies: Negative.   Psychiatric/Behavioral: Negative for depression, suicidal ideas, hallucinations, memory loss and substance abuse. The patient has insomnia. The patient is not nervous/anxious.    Physical Exam: Estimated body mass index is 29.62 kg/(m^2) as calculated from the following:   Height as of this encounter: '5\' 5"'  (1.651 m).   Weight as of this encounter: 178 lb (80.74 kg). BP 132/80 mmHg  Pulse 60  Temp(Src) 97.5 F (36.4 C) (Temporal)  Resp 16  Ht '5\' 5"'  (1.651 m)  Wt 178 lb (80.74 kg)  BMI 29.62 kg/m2  SpO2 96% General Appearance: Well nourished, in no apparent distress.  Eyes: PERRLA, EOMs, conjunctiva no swelling or erythema, normal fundi and vessels.  Sinuses: No Frontal/maxillary tenderness  ENT/Mouth: Ext aud canals clear, normal light reflex with TMs without erythema, bulging. Good  dentition. No erythema, swelling, or exudate on post pharynx. Tonsils not swollen or erythematous. Hearing normal.  Neck: Supple, thyroid normal. No bruits  Respiratory: Respiratory effort normal, BS equal bilaterally without rales, rhonchi, wheezing or stridor.  Cardio: RRR without murmurs, rubs or gallops. Brisk peripheral pulses without edema.  Chest: symmetric, with normal excursions and percussion.  Breasts: Symmetric, without lumps, nipple discharge, retractions.  Abdomen: Soft, nontender, no guarding, rebound, hernias, masses, or organomegaly.  Lymphatics: Non tender without lymphadenopathy.  Genitourinary: defer Musculoskeletal: Full ROM all peripheral extremities,5/5 strength, and normal gait.  Skin: Warm, dry without rashes, lesions, ecchymosis. Neuro:  Cranial nerves intact, reflexes equal bilaterally. Normal muscle tone, no cerebellar symptoms. Sensation intact.  Psych: Awake and oriented X 3, normal affect, Insight and Judgment appropriate.   EKG: defer, follows cardiology  Vicie Mutters 9:29 AM [facility]

## 2016-02-27 NOTE — Patient Instructions (Addendum)
[facility] Address: [address], [place],  [address]  Phone: [phone] Call to ask about group for adults that have lost there adult children.   Can try melatonin 5mg -20 mg at night for sleep  GET [place]  If this does not help we can try prescription medication.  Also here is some information about good sleep hygiene.   Insomnia Insomnia is frequent trouble falling and/or staying asleep. Insomnia can be a long term problem or a short term problem. Both are common. Insomnia can be a short term problem when the wakefulness is related to a certain stress or worry. Long term insomnia is often related to ongoing stress during waking hours and/or poor sleeping habits. Overtime, sleep deprivation itself can make the problem worse. Every little thing feels more severe because you are overtired and your ability to cope is decreased. CAUSES   Stress, anxiety, and depression.  Poor sleeping habits.  Distractions such as TV in the bedroom.  Naps close to bedtime.  Engaging in emotionally charged conversations before bed.  Technical reading before sleep.  Alcohol and other sedatives. They may make the problem worse. They can hurt normal sleep patterns and normal dream activity.  Stimulants such as caffeine for several hours prior to bedtime.  Pain syndromes and shortness of breath can cause insomnia.  Exercise late at night.  Changing time zones may cause sleeping problems (jet lag). It is sometimes helpful to have someone observe your sleeping patterns. They should look for periods of not breathing during the night (sleep apnea). They should also look to see how long those periods last. If you live alone or observers are uncertain, you can also be observed at a sleep clinic where your sleep patterns will be professionally monitored. Sleep apnea requires a checkup and treatment. Give your caregivers your medical history. Give your caregivers  observations your family has made about your sleep.  SYMPTOMS   Not feeling rested in the morning.  Anxiety and restlessness at bedtime.  Difficulty falling and staying asleep. TREATMENT   Your caregiver may prescribe treatment for an underlying medical disorders. Your caregiver can give advice or help if you are using alcohol or other drugs for self-medication. Treatment of underlying problems will usually eliminate insomnia problems.  Medications can be prescribed for short time use. They are generally not recommended for lengthy use.  Over-the-counter sleep medicines are not recommended for lengthy use. They can be habit forming.  You can promote easier sleeping by making lifestyle changes such as:  Using relaxation techniques that help with breathing and reduce muscle tension.  Exercising earlier in the day.  Changing your diet and the time of your last meal. No night time snacks.  Establish a regular time to go to bed.  Counseling can help with stressful problems and worry.  Soothing music and white noise may be helpful if there are background noises you cannot remove.  Stop tedious detailed work at least one hour before bedtime. HOME CARE INSTRUCTIONS   Keep a diary. Inform your caregiver about your progress. This includes any medication side effects. See your caregiver regularly. Take note of:  Times when you are asleep.  Times when you are awake during the night.  The quality of your sleep.  How you feel the next day. This information will help your caregiver care for you.  Get out of bed if you are still awake after 15 minutes. Read or do some quiet activity. Keep the lights  down. Wait until you feel sleepy and go back to bed.  Keep regular sleeping and waking hours. Avoid naps.  Exercise regularly.  Avoid distractions at bedtime. Distractions include watching television or engaging in any intense or detailed activity like attempting to balance the household  checkbook.  Develop a bedtime ritual. Keep a familiar routine of bathing, brushing your teeth, climbing into bed at the same time each night, listening to soothing music. Routines increase the success of falling to sleep faster.  Use relaxation techniques. This can be using breathing and muscle tension release routines. It can also include visualizing peaceful scenes. You can also help control troubling or intruding thoughts by keeping your mind occupied with boring or repetitive thoughts like the old concept of counting sheep. You can make it more creative like imagining planting one beautiful flower after another in your backyard garden.  During your day, work to eliminate stress. When this is not possible use some of the previous suggestions to help reduce the anxiety that accompanies stressful situations. MAKE SURE YOU:   Understand these instructions.  Will watch your condition.  Will get help right away if you are not doing well or get worse. Document Released: 09/21/2000 Document Revised: 03/11/201  CAN DO TUMS, CAN DO ZANTAC OVER THE COUNTER IF NEEDED BUT OTHERWISE PAY ATTENTION TO FOOD AND HERE IS LIST BELOW   Food Choices for Gastroesophageal Reflux Disease, Adult When you have gastroesophageal reflux disease (GERD), the foods you eat and your eating habits are very important. Choosing the right foods can help ease the discomfort of GERD. WHAT GENERAL GUIDELINES DO I NEED TO FOLLOW?  Choose fruits, vegetables, whole grains, low-fat dairy products, and low-fat meat, fish, and poultry.  Limit fats such as oils, salad dressings, butter, nuts, and avocado.  Keep a food diary to identify foods that cause symptoms.  Avoid foods that cause reflux. These may be different for different people.  Eat frequent small meals instead of three large meals each day.  Eat your meals slowly, in a relaxed setting.  Limit fried foods.  Cook foods using methods other than frying.  Avoid  drinking alcohol.  Avoid drinking large amounts of liquids with your meals.  Avoid bending over or lying down until 2-3 hours after eating. WHAT FOODS ARE NOT RECOMMENDED? The following are some foods and drinks that may worsen your symptoms: Vegetables Tomatoes. Tomato juice. Tomato and spaghetti sauce. Chili peppers. Onion and garlic. Horseradish. Fruits Oranges, grapefruit, and lemon (fruit and juice). Meats High-fat meats, fish, and poultry. This includes hot dogs, ribs, ham, sausage, salami, and bacon. Dairy Whole milk and chocolate milk. Sour cream. Cream. Butter. Ice cream. Cream cheese.  Beverages Coffee and tea, with or without caffeine. Carbonated beverages or energy drinks. Condiments Hot sauce. Barbecue sauce.  Sweets/Desserts Chocolate and cocoa. Donuts. Peppermint and spearmint. Fats and Oils High-fat foods, including [place] fries and potato chips. Other Vinegar. Strong spices, such as black pepper, white pepper, red pepper, cayenne, curry powder, cloves, ginger, and chili powder. The items listed above may not be a complete list of foods and beverages to avoid. Contact your dietitian for more information.   This information is not intended to replace advice given to you by your health care provider. Make sure you discuss any questions you have with your health care provider.   Document Released: 09/24/2005 Document Revised: 10/15/2014 Document Reviewed: 07/29/2013 Elsevier Interactive Patient Education [employer].

## 2016-02-28 LAB — HEPATIC FUNCTION PANEL
ALT: 12 U/L (ref 6–29)
AST: 16 U/L (ref 10–35)
Albumin: 4.2 g/dL (ref 3.6–5.1)
Alkaline Phosphatase: 99 U/L (ref 33–130)
Bilirubin, Direct: 0.2 mg/dL (ref <=0.2)
Indirect Bilirubin: 0.4 mg/dL (ref 0.2–1.2)
Total Bilirubin: 0.6 mg/dL (ref 0.2–1.2)
Total Protein: 6.9 g/dL (ref 6.1–8.1)

## 2016-02-28 LAB — HEMOGLOBIN A1C
Hgb A1c MFr Bld: 5.5 % (ref <5.7)
Mean Plasma Glucose: 111 mg/dL

## 2016-02-28 LAB — LIPID PANEL
Cholesterol: 178 mg/dL (ref 125–200)
HDL: 53 mg/dL (ref >=46)
LDL Cholesterol: 111 mg/dL (ref <130)
Total CHOL/HDL Ratio: 3.4 Ratio (ref <=5.0)
Triglycerides: 71 mg/dL (ref <150)
VLDL: 14 mg/dL (ref <30)

## 2016-02-28 LAB — VITAMIN B12: Vitamin B-12: 593 pg/mL (ref 200–1100)

## 2016-02-28 LAB — BASIC METABOLIC PANEL WITH GFR
BUN: 9 mg/dL (ref 7–25)
CO2: 30 mmol/L (ref 20–31)
Calcium: 9.2 mg/dL (ref 8.6–10.4)
Chloride: 105 mmol/L (ref 98–110)
Creat: 0.76 mg/dL (ref 0.50–1.05)
GFR, Est African American: >89 mL/min (ref >=60)
GFR, Est Non African American: 87 mL/min (ref >=60)
Glucose, Bld: 79 mg/dL (ref 65–99)
Potassium: 4.3 mmol/L (ref 3.5–5.3)
Sodium: 144 mmol/L (ref 135–146)

## 2016-02-28 LAB — VITAMIN D 25 HYDROXY (VIT D DEFICIENCY, FRACTURES): Vit D, 25-Hydroxy: 33 ng/mL (ref 30–100)

## 2016-02-28 LAB — IRON AND TIBC
%SAT: 19 % (ref 11–50)
Iron: 60 ug/dL (ref 45–160)
TIBC: 324 ug/dL (ref 250–450)
UIBC: 264 ug/dL (ref 125–400)

## 2016-02-28 LAB — MICROALBUMIN / CREATININE URINE RATIO
Creatinine, Urine: 263 mg/dL (ref 20–320)
Microalb Creat Ratio: 4 mcg/mg creat (ref <30)
Microalb, Ur: 1 mg/dL

## 2016-02-28 LAB — MAGNESIUM: Magnesium: 1.9 mg/dL (ref 1.5–2.5)

## 2016-02-28 LAB — TSH: TSH: 2.2 mIU/L

## 2016-02-28 LAB — INSULIN, FASTING: Insulin fasting, serum: 3.4 u[IU]/mL (ref 2.0–19.6)

## 2016-02-28 LAB — FERRITIN: Ferritin: 113 ng/mL (ref 10–232)

## 2016-03-08 DIAGNOSIS — N6002 Solitary cyst of left breast: Secondary | ICD-10-CM | POA: Diagnosis not present

## 2016-03-16 ENCOUNTER — Encounter: Payer: Self-pay | Admitting: Cardiology

## 2016-03-18 LAB — CUP PACEART REMOTE DEVICE CHECK
Battery Remaining Longevity: 143 mo
Battery Remaining Percentage: 95.5 %
Battery Voltage: 2.99 V
Brady Statistic AP VP Percent: 34 %
Brady Statistic AP VS Percent: 10 %
Brady Statistic AS VP Percent: 46 %
Brady Statistic AS VS Percent: 9.1 %
Brady Statistic RA Percent Paced: 44 %
Brady Statistic RV Percent Paced: 81 %
Date Time Interrogation Session: 20170429134145
Implantable Lead Implant Date: 20140408
Implantable Lead Implant Date: 20140408
Implantable Lead Location: 753859
Implantable Lead Location: 753860
Implantable Lead Model: 1944
Implantable Lead Model: 1948
Lead Channel Impedance Value: 510 Ohm
Lead Channel Impedance Value: 680 Ohm
Lead Channel Pacing Threshold Amplitude: 0.5 V
Lead Channel Pacing Threshold Amplitude: 0.5 V
Lead Channel Pacing Threshold Pulse Width: 0.4 ms
Lead Channel Pacing Threshold Pulse Width: 0.4 ms
Lead Channel Sensing Intrinsic Amplitude: 12 mV
Lead Channel Sensing Intrinsic Amplitude: 4.5 mV
Lead Channel Setting Pacing Amplitude: 0.75 V
Lead Channel Setting Pacing Amplitude: 1.5 V
Lead Channel Setting Pacing Pulse Width: 0.4 ms
Lead Channel Setting Sensing Sensitivity: 2 mV
Pulse Gen Model: 2210
Pulse Gen Serial Number: 7457437

## 2016-03-28 ENCOUNTER — Ambulatory Visit: Payer: Self-pay | Admitting: Physician Assistant

## 2016-03-30 ENCOUNTER — Encounter: Payer: Self-pay | Admitting: Cardiology

## 2016-05-01 ENCOUNTER — Other Ambulatory Visit: Payer: Self-pay | Admitting: Physician Assistant

## 2016-05-01 ENCOUNTER — Encounter: Payer: Self-pay | Admitting: Physician Assistant

## 2016-05-01 ENCOUNTER — Ambulatory Visit (INDEPENDENT_AMBULATORY_CARE_PROVIDER_SITE_OTHER): Payer: BLUE CROSS/BLUE SHIELD | Admitting: Physician Assistant

## 2016-05-01 VITALS — BP 126/74 | HR 76 | Temp 97.6°F | Resp 16 | Ht 65.0 in | Wt 180.4 lb

## 2016-05-01 DIAGNOSIS — N644 Mastodynia: Secondary | ICD-10-CM

## 2016-05-01 DIAGNOSIS — R0789 Other chest pain: Secondary | ICD-10-CM

## 2016-05-01 DIAGNOSIS — R1013 Epigastric pain: Secondary | ICD-10-CM | POA: Diagnosis not present

## 2016-05-01 DIAGNOSIS — M79605 Pain in left leg: Secondary | ICD-10-CM

## 2016-05-01 DIAGNOSIS — M545 Low back pain, unspecified: Secondary | ICD-10-CM

## 2016-05-01 DIAGNOSIS — I441 Atrioventricular block, second degree: Secondary | ICD-10-CM

## 2016-05-01 LAB — CBC WITH DIFFERENTIAL/PLATELET
Basophils Absolute: 0 cells/uL (ref 0–200)
Basophils Relative: 0 %
Eosinophils Absolute: 110 cells/uL (ref 15–500)
Eosinophils Relative: 2 %
HCT: 42.7 % (ref 35.0–45.0)
Hemoglobin: 14.3 g/dL (ref 11.7–15.5)
Lymphocytes Relative: 49 %
Lymphs Abs: 2695 cells/uL (ref 850–3900)
MCH: 30.5 pg (ref 27.0–33.0)
MCHC: 33.5 g/dL (ref 32.0–36.0)
MCV: 91 fL (ref 80.0–100.0)
MPV: 9.6 fL (ref 7.5–12.5)
Monocytes Absolute: 495 cells/uL (ref 200–950)
Monocytes Relative: 9 %
Neutro Abs: 2200 cells/uL (ref 1500–7800)
Neutrophils Relative %: 40 %
Platelets: 248 10*3/uL (ref 140–400)
RBC: 4.69 MIL/uL (ref 3.80–5.10)
RDW: 13.7 % (ref 11.0–15.0)
WBC: 5.5 10*3/uL (ref 3.8–10.8)

## 2016-05-01 LAB — HEPATIC FUNCTION PANEL
ALT: 15 U/L (ref 6–29)
AST: 15 U/L (ref 10–35)
Albumin: 4.3 g/dL (ref 3.6–5.1)
Alkaline Phosphatase: 108 U/L (ref 33–130)
Bilirubin, Direct: 0.1 mg/dL (ref <=0.2)
Indirect Bilirubin: 0.5 mg/dL (ref 0.2–1.2)
Total Bilirubin: 0.6 mg/dL (ref 0.2–1.2)
Total Protein: 7.4 g/dL (ref 6.1–8.1)

## 2016-05-01 LAB — AMYLASE: Amylase: 19 U/L (ref 0–105)

## 2016-05-01 LAB — BASIC METABOLIC PANEL WITH GFR
BUN: 12 mg/dL (ref 7–25)
CO2: 30 mmol/L (ref 20–31)
Calcium: 9.9 mg/dL (ref 8.6–10.4)
Chloride: 108 mmol/L (ref 98–110)
Creat: 0.77 mg/dL (ref 0.50–1.05)
GFR, Est African American: >89 mL/min (ref >=60)
GFR, Est Non African American: 85 mL/min (ref >=60)
Glucose, Bld: 84 mg/dL (ref 65–99)
Potassium: 5 mmol/L (ref 3.5–5.3)
Sodium: 146 mmol/L (ref 135–146)

## 2016-05-01 MED ORDER — PREDNISONE 20 MG PO TABS: ORAL_TABLET | ORAL | 0 refills | Status: DC

## 2016-05-01 NOTE — Addendum Note (Signed)
Addended by: Vicie Mutters R on: 05/01/2016 11:48 AM   Modules accepted: Orders
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
## 2016-05-01 NOTE — Patient Instructions (Signed)
Sciatica Sciatica is pain, weakness, numbness, or tingling along your sciatic nerve. The nerve starts in the lower back and runs down the back of each leg. Nerve damage or certain conditions pinch or put pressure on the sciatic nerve. This causes the pain, weakness, and other discomforts of sciatica. HOME CARE   Only take medicine as told by your doctor.  Apply ice to the affected area for 20 minutes. Do this 3-4 times a day for the first 48-72 hours. Then try heat in the same way.  Exercise, stretch, or do your usual activities if these do not make your pain worse.  Go to physical therapy as told by your doctor.  Keep all doctor visits as told.  Do not wear high heels or shoes that are not supportive.  Get a firm mattress if your mattress is too soft to lessen pain and discomfort. GET HELP RIGHT AWAY IF:   You cannot control when you poop (bowel movement) or pee (urinate).  You have more weakness in your lower back, lower belly (pelvis), butt (buttocks), or legs.  You have redness or puffiness (swelling) of your back.  You have a burning feeling when you pee.  You have pain that gets worse when you lie down.  You have pain that wakes you from your sleep.  Your pain is worse than past pain.  Your pain lasts longer than 4 weeks.  You are suddenly losing weight without reason. MAKE SURE YOU:   Understand these instructions.  Will watch this condition.  Will get help right away if you are not doing well or get worse.   This information is not intended to replace advice given to you by your health care provider. Make sure you discuss any questions you have with your health care provider.   Document Released: 07/03/2008 Document Revised: 06/15/2015 Document Reviewed: 02/03/2012 Elsevier Interactive Patient Education 2016 Elsevier Inc.  Fibrocystic Breast Changes Fibrocystic breast changes happen when tiny sacs filled with fluid form in the breast. They are not cancer.  They can feel like lumps. HOME CARE  Check your breasts after every menstrual period or the first day of every month if you do not have menstrual periods. Check for:  Soreness.  New puffiness (swelling).  A change in breast size.  A change in a lump that was already there.  Only take medicine as told by your doctor.  Wear a support or sports bra that fits well.  Avoid caffeine in pop, chocolate, coffee, and tea. GET HELP IF:   You have fluid coming from your nipples, especially if it is bloody.  You have new lumps or bumps in your breast.  Your breast becomes puffy, red, and painful.  You have changes in how your breast looks.  Your nipples look flat or sunk in.   This information is not intended to replace advice given to you by your health care provider. Make sure you discuss any questions you have with your health care provider.   Document Released: 09/06/2008 Document Revised: 09/29/2013 Document Reviewed: 03/15/2013 Elsevier Interactive Patient Education 2016 Elsevier Inc. Costochondritis Costochondritis, sometimes called Tietze syndrome, is a swelling and irritation (inflammation) of the tissue (cartilage) that connects your ribs with your breastbone (sternum). It causes pain in the chest and rib area. Costochondritis usually goes away on its own over time. It can take up to 6 weeks or longer to get better, especially if you are unable to limit your activities. CAUSES  Some cases of costochondritis  have no known cause. Possible causes include:  Injury (trauma).  Exercise or activity such as lifting.  Severe coughing. SIGNS AND SYMPTOMS  Pain and tenderness in the chest and rib area.  Pain that gets worse when coughing or taking deep breaths.  Pain that gets worse with specific movements. DIAGNOSIS  Your health care provider will do a physical exam and ask about your symptoms. Chest X-rays or other tests may be done to rule out other problems. TREATMENT    Costochondritis usually goes away on its own over time. Your health care provider may prescribe medicine to help relieve pain. HOME CARE INSTRUCTIONS   Avoid exhausting physical activity. Try not to strain your ribs during normal activity. This would include any activities using chest, abdominal, and side muscles, especially if heavy weights are used.  Apply ice to the affected area for the first 2 days after the pain begins.  Put ice in a plastic bag.  Place a towel between your skin and the bag.  Leave the ice on for 20 minutes, 2-3 times a day.  Only take over-the-counter or prescription medicines as directed by your health care provider. SEEK MEDICAL CARE IF:  You have redness or swelling at the rib joints. These are signs of infection.  Your pain does not go away despite rest or medicine. SEEK IMMEDIATE MEDICAL CARE IF:   Your pain increases or you are very uncomfortable.  You have shortness of breath or difficulty breathing.  You cough up blood.  You have worse chest pains, sweating, or vomiting.  You have a fever or persistent symptoms for more than 2-3 days.  You have a fever and your symptoms suddenly get worse. MAKE SURE YOU:   Understand these instructions.  Will watch your condition.  Will get help right away if you are not doing well or get worse.   This information is not intended to replace advice given to you by your health care provider. Make sure you discuss any questions you have with your health care provider.   Document Released: 07/04/2005 Document Revised: 07/15/2013 Document Reviewed: 04/28/2013 Elsevier Interactive Patient Education [employer].

## 2016-05-07 ENCOUNTER — Ambulatory Visit (INDEPENDENT_AMBULATORY_CARE_PROVIDER_SITE_OTHER): Payer: BLUE CROSS/BLUE SHIELD | Admitting: *Deleted

## 2016-05-07 ENCOUNTER — Telehealth: Payer: Self-pay | Admitting: Cardiology

## 2016-05-07 DIAGNOSIS — I441 Atrioventricular block, second degree: Secondary | ICD-10-CM

## 2016-05-07 NOTE — Progress Notes (Signed)
Remote pacemaker transmission.   

## 2016-05-07 NOTE — Telephone Encounter (Signed)
Spoke with pt and reminded pt of remote transmission that is due today. Pt verbalized understanding.   

## 2016-05-08 ENCOUNTER — Encounter: Payer: Self-pay | Admitting: Cardiology

## 2016-05-11 DIAGNOSIS — M5442 Lumbago with sciatica, left side: Secondary | ICD-10-CM | POA: Diagnosis not present

## 2016-05-16 DIAGNOSIS — M545 Low back pain: Secondary | ICD-10-CM | POA: Diagnosis not present

## 2016-05-17 DIAGNOSIS — M545 Low back pain: Secondary | ICD-10-CM | POA: Diagnosis not present

## 2016-05-17 LAB — CUP PACEART REMOTE DEVICE CHECK
Battery Remaining Longevity: 143 mo
Battery Remaining Percentage: 95.5 %
Battery Voltage: 2.99 V
Brady Statistic AP VP Percent: 35 %
Brady Statistic AP VS Percent: 9.1 %
Brady Statistic AS VP Percent: 48 %
Brady Statistic AS VS Percent: 8.4 %
Brady Statistic RA Percent Paced: 43 %
Brady Statistic RV Percent Paced: 82 %
Date Time Interrogation Session: 20170731153951
Implantable Lead Implant Date: 20140408
Implantable Lead Implant Date: 20140408
Implantable Lead Location: 753859
Implantable Lead Location: 753860
Implantable Lead Model: 1944
Implantable Lead Model: 1948
Lead Channel Impedance Value: 530 Ohm
Lead Channel Impedance Value: 690 Ohm
Lead Channel Pacing Threshold Amplitude: 0.5 V
Lead Channel Pacing Threshold Amplitude: 0.5 V
Lead Channel Pacing Threshold Pulse Width: 0.4 ms
Lead Channel Pacing Threshold Pulse Width: 0.4 ms
Lead Channel Sensing Intrinsic Amplitude: 12 mV
Lead Channel Sensing Intrinsic Amplitude: 3.6 mV
Lead Channel Setting Pacing Amplitude: 0.75 V
Lead Channel Setting Pacing Amplitude: 1.5 V
Lead Channel Setting Pacing Pulse Width: 0.4 ms
Lead Channel Setting Sensing Sensitivity: 2 mV
Pulse Gen Model: 2210
Pulse Gen Serial Number: 7457437

## 2016-05-21 DIAGNOSIS — M545 Low back pain: Secondary | ICD-10-CM | POA: Diagnosis not present

## 2016-05-22 ENCOUNTER — Encounter: Payer: Self-pay | Admitting: Cardiology

## 2016-05-24 DIAGNOSIS — M545 Low back pain: Secondary | ICD-10-CM | POA: Diagnosis not present

## 2016-05-29 DIAGNOSIS — M545 Low back pain: Secondary | ICD-10-CM | POA: Diagnosis not present

## 2016-05-31 DIAGNOSIS — M545 Low back pain: Secondary | ICD-10-CM | POA: Diagnosis not present

## 2016-06-11 ENCOUNTER — Encounter: Payer: Self-pay | Admitting: *Deleted

## 2016-07-04 DIAGNOSIS — Z1231 Encounter for screening mammogram for malignant neoplasm of breast: Secondary | ICD-10-CM | POA: Diagnosis not present

## 2016-07-05 ENCOUNTER — Ambulatory Visit (INDEPENDENT_AMBULATORY_CARE_PROVIDER_SITE_OTHER): Payer: BLUE CROSS/BLUE SHIELD | Admitting: *Deleted

## 2016-07-05 DIAGNOSIS — Z23 Encounter for immunization: Secondary | ICD-10-CM

## 2016-07-17 DIAGNOSIS — H52223 Regular astigmatism, bilateral: Secondary | ICD-10-CM | POA: Diagnosis not present

## 2016-07-17 DIAGNOSIS — H524 Presbyopia: Secondary | ICD-10-CM | POA: Diagnosis not present

## 2016-07-17 DIAGNOSIS — H5203 Hypermetropia, bilateral: Secondary | ICD-10-CM | POA: Diagnosis not present

## 2016-08-02 ENCOUNTER — Ambulatory Visit (INDEPENDENT_AMBULATORY_CARE_PROVIDER_SITE_OTHER): Payer: BLUE CROSS/BLUE SHIELD | Admitting: Internal Medicine

## 2016-08-02 ENCOUNTER — Encounter: Payer: Self-pay | Admitting: Internal Medicine

## 2016-08-02 VITALS — BP 140/70 | HR 61 | Ht 66.0 in | Wt 184.8 lb

## 2016-08-02 DIAGNOSIS — I441 Atrioventricular block, second degree: Secondary | ICD-10-CM | POA: Diagnosis not present

## 2016-08-02 DIAGNOSIS — I480 Paroxysmal atrial fibrillation: Secondary | ICD-10-CM

## 2016-08-02 LAB — CUP PACEART INCLINIC DEVICE CHECK
Battery Remaining Longevity: 140.4
Battery Voltage: 2.99 V
Brady Statistic RA Percent Paced: 44 %
Brady Statistic RV Percent Paced: 86 %
Date Time Interrogation Session: 20171026151901
Implantable Lead Implant Date: 20140408
Implantable Lead Implant Date: 20140408
Implantable Lead Location: 753859
Implantable Lead Location: 753860
Implantable Lead Model: 1944
Implantable Lead Model: 1948
Lead Channel Impedance Value: 550 Ohm
Lead Channel Impedance Value: 712.5 Ohm
Lead Channel Pacing Threshold Amplitude: 0.5 V
Lead Channel Pacing Threshold Amplitude: 0.5 V
Lead Channel Pacing Threshold Pulse Width: 0.4 ms
Lead Channel Pacing Threshold Pulse Width: 0.4 ms
Lead Channel Sensing Intrinsic Amplitude: 12 mV
Lead Channel Sensing Intrinsic Amplitude: 4 mV
Lead Channel Setting Pacing Amplitude: 0.75 V
Lead Channel Setting Pacing Amplitude: 1.375
Lead Channel Setting Pacing Pulse Width: 0.4 ms
Lead Channel Setting Sensing Sensitivity: 2 mV
Pulse Gen Model: 2210
Pulse Gen Serial Number: 7457437

## 2016-08-02 NOTE — Patient Instructions (Signed)
Medication Instructions:  Your physician recommends that you continue on your current medications as directed. Please refer to the Current Medication list given to you today.   Labwork: None ordered   Testing/Procedures: None ordered   Follow-Up: Your physician wants you to follow-up in: 12 months with Chanetta Marshall, NP You will receive a reminder letter in the mail two months in advance. If you don't receive a letter, please call our office to schedule the follow-up appointment.  Remote monitoring is used to monitor your Pacemaker  from home. This monitoring reduces the number of office visits required to check your device to one time per year. It allows [place] to keep an eye on the functioning of your device to ensure it is working properly. You are scheduled for a device check from home on 11/01/16. You may send your transmission at any time that day. If you have a wireless device, the transmission will be sent automatically. After your physician reviews your transmission, you will receive a postcard with your next transmission date.     Any Other Special Instructions Will Be Listed Below (If Applicable).     If you need a refill on your cardiac medications before your next appointment, please call your pharmacy.

## 2016-08-02 NOTE — Progress Notes (Signed)
   PCP: [name], MD  Alyssa Dudley is a 60 y.o. female who presents today for routine electrophysiology followup.  Since her last visit, the patient reports doing very well.  She is no longer running. She graduated from [employer] recently.  Today, she denies symptoms of palpitations, chest pain, shortness of breath,  lower extremity edema, dizziness, presyncope, or syncope.  The patient is otherwise without complaint today.   Past Medical History:  Diagnosis Date  . Hyperlipidemia   . Paroxysmal atrial fibrillation ([place]) 6/15   detected on ppm interrogation  . PONV (postoperative nausea and vomiting)    has vomitted in past  . Second degree Mobitz II AV block   . Vitamin D deficiency    Past Surgical History:  Procedure Laterality Date  . BREAST CYST EXCISION  1980  . PERMANENT PACEMAKER INSERTION N/A 01/13/2013   SJM Acc[employer] DR RF implanted by Dr [name] for mobitz II AV block  . WRIST SURGERY  2011   removed cyst    Current Outpatient Prescriptions  Medication Sig Dispense Refill  . diphenhydramine-acetaminophen (TYLENOL PM) 25-500 MG TABS tablet Take 1 tablet by mouth at bedtime as needed (pain/sleep).     . triamcinolone cream (KENALOG) 0.1 % Apply 1 application topically 3 (three) times daily as needed (rash).    . Vitamin D, Ergocalciferol, (DRISDOL) 50000 units CAPS capsule TAKE ONE CAPSULE BY MOUTH EVERY 7 DAYS. 30 capsule 3   No current facility-administered medications for this visit.     Physical Exam: Vitals:   08/02/16 1417  BP: 140/70  Pulse: 61  SpO2: 99%  Weight: 184 lb 12.8 oz (83.8 kg)  Height: 5\' 6"  (1.676 m)    GEN- The patient is well appearing, alert and oriented x 3 today.   Head- normocephalic, atraumatic Eyes-  Sclera clear, conjunctiva pink Ears- hearing intact Oropharynx- clear Lungs- Clear to ausculation bilaterally, normal work of breathing Chest- pacemaker pocket is well healed Heart- Regular rate and rhythm, no murmurs, rubs or  gallops, PMI not laterally displaced GI- soft, NT, ND, + BS Extremities- no clubbing, cyanosis, or edema  Pacemaker interrogation- reviewed in detail today,  See PACEART report  Assessment and Plan:  1. Mobitz II AV block Normal pacemaker function See Pace Art report No changes today I have discussed the available STJ firmware patch for cyberhacking with the patient today.  We have discussed the risks associated with the firmware upgrade as well as the theoretical risk for cyberhacking.  After an informed discussion, the patient has decided not to install the firmware patch at this time.   2. Paroxysmal afib Detected on PPM interrogated (burden <1%) chads2vasc score is only 1.  I therefore have not started anticoagulation today  Merlin Return every 12 months to see EP NP I will see when needed  Thompson Grayer MD, [facility] 08/02/2016 2:47 PM

## 2016-08-12 ENCOUNTER — Encounter: Payer: Self-pay | Admitting: *Deleted

## 2016-08-29 ENCOUNTER — Ambulatory Visit (INDEPENDENT_AMBULATORY_CARE_PROVIDER_SITE_OTHER): Payer: BLUE CROSS/BLUE SHIELD | Admitting: Physician Assistant

## 2016-08-29 VITALS — BP 124/80 | HR 80 | Temp 97.5°F | Resp 16 | Ht 66.0 in | Wt 184.4 lb

## 2016-08-29 DIAGNOSIS — R7309 Other abnormal glucose: Secondary | ICD-10-CM

## 2016-08-29 DIAGNOSIS — E559 Vitamin D deficiency, unspecified: Secondary | ICD-10-CM

## 2016-08-29 DIAGNOSIS — I480 Paroxysmal atrial fibrillation: Secondary | ICD-10-CM

## 2016-08-29 DIAGNOSIS — Z79899 Other long term (current) drug therapy: Secondary | ICD-10-CM

## 2016-08-29 DIAGNOSIS — E785 Hyperlipidemia, unspecified: Secondary | ICD-10-CM | POA: Diagnosis not present

## 2016-08-29 DIAGNOSIS — I10 Essential (primary) hypertension: Secondary | ICD-10-CM

## 2016-08-29 MED ORDER — VITAMIN D (ERGOCALCIFEROL) 1.25 MG (50000 UNIT) PO CAPS: ORAL_CAPSULE | ORAL | 3 refills | Status: DC

## 2016-08-29 NOTE — Progress Notes (Signed)
6 month follow up  Assessment and Plan: Essential hypertension - continue medications, DASH diet, exercise and monitor at home. Call if greater than 130/80. - CBC with Differential/Platelet - BASIC METABOLIC PANEL WITH GFR - TSH - Hepatic function panel   Hyperlipidemia -continue medications, check lipids, decrease fatty foods, increase activity.  - Lipid panel  Obesity Obesity with co morbidities- long discussion about weight loss, diet, and exercise  Mobitz type 2 second degree and high-grade heart block Has pacemaker   Paroxysmal atrial fibrillation Not on coagulation, following with Cardio  Gastroesophageal reflux disease without esophagitis Continue PPI/H2 blocker, diet discussed  Abnormal glucose Discussed general issues about diabetes pathophysiology and management., Educational material distributed., Suggested low cholesterol diet., Encouraged aerobic exercise., Discussed foot care., Reminded to get yearly retinal exam. - Hemoglobin A1c - Insulin, fasting  Vitamin D deficiency May need to increase to 2 times a week.  - Vit D  25 hydroxy (rtn osteoporosis monitoring)  Medication management - Magnesium   Discussed med's effects and SE's. Screening labs and tests as requested with regular follow-up as recommended. Future Appointments Date Time Provider [place]  11/01/2016 8:20 AM [facility] DEVICE REMOTES [facility]     HPI  60 y.o. female  presents for 6 month follow up for HTN, chol, preDM, vitamin D def.   Her blood pressure has been controlled at home, today their BP is BP: 124/80 She does workout, walking and running, doing 5K this Saturday. She denies chest pain, shortness of breath, dizziness.  She has Afib and mobitz type 2, follows with Dr. [name] and Leonides Schanz, low risk CHADSVASC 1 and not on anticoagulation.  She is not on cholesterol medication, states that last time she was not on it. Her cholesterol is not at goal. The  cholesterol last visit was:   Lab Results  Component Value Date   CHOL 178 02/27/2016   HDL 53 02/27/2016   LDLCALC 111 02/27/2016   TRIG 71 02/27/2016   CHOLHDL 3.4 02/27/2016    She has been working on diet and exercise for prediabetes and has done a good job of decreasing her A1C, and denies paresthesia of the feet, polydipsia, polyuria and visual disturbances. Last A1C in the office was:  Lab Results  Component Value Date   HGBA1C 5.5 02/27/2016  Patient is on Vitamin D supplement, 50,000 IU only once a week.   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 Graduated from [employer] in May. She states she has not been sleeping well with melatonin and walking.    BMI is Body mass index is 29.76 kg/m., she is working on diet and exercise. Wt Readings from Last 3 Encounters:  08/29/16 184 lb 6.4 oz (83.6 kg)  08/02/16 184 lb 12.8 oz (83.8 kg)  05/01/16 180 lb 6.4 oz (81.8 kg)     Current Medications:  Current Outpatient Prescriptions on File Prior to Visit  Medication Sig Dispense Refill  . diphenhydramine-acetaminophen (TYLENOL PM) 25-500 MG TABS tablet Take 1 tablet by mouth at bedtime as needed (pain/sleep).     . triamcinolone cream (KENALOG) 0.1 % Apply 1 application topically 3 (three) times daily as needed (rash).    . Vitamin D, Ergocalciferol, (DRISDOL) 50000 units CAPS capsule TAKE ONE CAPSULE BY MOUTH EVERY 7 DAYS. 30 capsule 3   No current facility-administered medications on file prior to visit.  Patient Active Problem List   Diagnosis Date Noted  . Obesity 02/25/2015  . HTN (hypertension) 08/17/2014  . Abnormal glucose 08/17/2014  . Medication management 08/17/2014  . Atrial fibrillation ([place]) 06/07/2014  . Hyperlipidemia   . Vitamin D deficiency   . Mobitz type 2 second degree and high-grade heart block 01/07/2013  . Family history of malignant  neoplasm of gastrointestinal tract 10/15/2011  . Benign neoplasm of colon 10/15/2011  . GERD 01/10/2008  . Constipation 01/10/2008  . History of peptic ulcer disease 01/10/2008     Allergies: No Known Allergies   Review of Systems: Review of Systems  Constitutional: Negative.   HENT: Negative.   Eyes: Negative.   Respiratory: Negative.   Cardiovascular: Negative.   Gastrointestinal: Negative.   Genitourinary: Negative.   Musculoskeletal: Negative.   Skin: Negative.   Neurological: Negative.   Endo/Heme/Allergies: Negative.   Psychiatric/Behavioral: Negative for depression, hallucinations, memory loss, substance abuse and suicidal ideas. The patient is not nervous/anxious and does not have insomnia.    Physical Exam: Estimated body mass index is 29.76 kg/m as calculated from the following:   Height as of this encounter: 5\' 6"  (1.676 m).   Weight as of this encounter: 184 lb 6.4 oz (83.6 kg). BP 124/80   Pulse 80   Temp 97.5 F (36.4 C)   Resp 16   Ht 5\' 6"  (1.676 m)   Wt 184 lb 6.4 oz (83.6 kg)   BMI 29.76 kg/m  General Appearance: Well nourished, in no apparent distress.  Eyes: PERRLA, EOMs, conjunctiva no swelling or erythema, normal fundi and vessels.  Sinuses: No Frontal/maxillary tenderness  ENT/Mouth: Ext aud canals clear, normal light reflex with TMs without erythema, bulging. Good dentition. No erythema, swelling, or exudate on post pharynx. Tonsils not swollen or erythematous. Hearing normal.  Neck: Supple, thyroid normal. No bruits  Respiratory: Respiratory effort normal, BS equal bilaterally without rales, rhonchi, wheezing or stridor.  Cardio: RRR without murmurs, rubs or gallops. Brisk peripheral pulses without edema.  Chest: symmetric, with normal excursions and percussion. .  Abdomen: Soft, nontender, no guarding, rebound, hernias, masses, or organomegaly.  Lymphatics: Non tender without lymphadenopathy.  Musculoskeletal: Full ROM all peripheral  extremities,5/5 strength, and normal gait.  Skin: Warm, dry without rashes, lesions, ecchymosis. Neuro: Cranial nerves intact, reflexes equal bilaterally. Normal muscle tone, no cerebellar symptoms. Sensation intact.  Psych: Awake and oriented X 3, normal affect, Insight and Judgment appropriate.    Vicie Mutters 9:12 AM [facility]

## 2016-08-29 NOTE — Patient Instructions (Signed)
  Vitamin D goal is between 60-80  Please make sure that you are taking your Vitamin D as directed.   It is very important as a natural anti-inflammatory   helping hair, skin, and nails, as well as reducing stroke and heart attack risk.   It helps your bones and helps with mood.  It also decreases numerous cancer risks so please take it as directed.   Low Vit D is associated with a 200-300% higher risk for CANCER   and 200-300% higher risk for HEART   ATTACK  &  STROKE.    .....................................Marland Kitchen  It is also associated with higher death rate at younger ages,   autoimmune diseases like Rheumatoid arthritis, Lupus, Multiple Sclerosis.     Also many other serious conditions, like depression, Alzheimer's  Dementia, infertility, muscle aches, fatigue, fibromyalgia - just to name a few.  +++++++++++++++++++  Do 2 of the 50,000 in a week

## 2016-10-31 ENCOUNTER — Ambulatory Visit (INDEPENDENT_AMBULATORY_CARE_PROVIDER_SITE_OTHER): Payer: BLUE CROSS/BLUE SHIELD | Admitting: Physician Assistant

## 2016-10-31 ENCOUNTER — Other Ambulatory Visit (HOSPITAL_COMMUNITY)
Admission: RE | Admit: 2016-10-31 | Discharge: 2016-10-31 | Disposition: A | Discharge status: Home / Self Care | Payer: BLUE CROSS/BLUE SHIELD | Source: Ambulatory Visit | Attending: Physician Assistant | Admitting: Physician Assistant

## 2016-10-31 ENCOUNTER — Encounter: Payer: Self-pay | Admitting: Physician Assistant

## 2016-10-31 VITALS — BP 140/82 | HR 89 | Temp 97.6°F | Resp 16 | Ht 66.0 in | Wt 181.0 lb

## 2016-10-31 DIAGNOSIS — Z01419 Encounter for gynecological examination (general) (routine) without abnormal findings: Secondary | ICD-10-CM | POA: Diagnosis not present

## 2016-10-31 DIAGNOSIS — N898 Other specified noninflammatory disorders of vagina: Secondary | ICD-10-CM

## 2016-10-31 DIAGNOSIS — Z1151 Encounter for screening for human papillomavirus (HPV): Secondary | ICD-10-CM | POA: Insufficient documentation

## 2016-10-31 DIAGNOSIS — R8290 Unspecified abnormal findings in urine: Secondary | ICD-10-CM

## 2016-10-31 DIAGNOSIS — Z113 Encounter for screening for infections with a predominantly sexual mode of transmission: Secondary | ICD-10-CM | POA: Diagnosis not present

## 2016-10-31 DIAGNOSIS — Z124 Encounter for screening for malignant neoplasm of cervix: Secondary | ICD-10-CM

## 2016-10-31 LAB — HIV ANTIBODY (ROUTINE TESTING W REFLEX): HIV 1&2 Ab, 4th Generation: NONREACTIVE

## 2016-10-31 NOTE — Progress Notes (Signed)
Subjective:    Patient ID: Alyssa Dudley, female    DOB: 1956/01/21, 61 y.o.   MRN: [id number]  HPI 61 y.o. AAF presents with vaginal discharge x 1 week. Had normal PAP 2015, never abnormal pap. Has had some suprapubic discomfort, non odorous white discharge. Had had new sexual partner 3 weeks ago, wore condom, + pain with deep penetration. No itching, no bleeding, dysuria, urgency, incontinence, fever/chills, joint pain, diarrhea. . Has constant lower back pain left worse than right. She was on clindamycin for tooth from dentist x 2 weeks, stopped 1-2 weeks ago.   Blood pressure 140/82, pulse 89, temperature 97.6 F (36.4 C), resp. rate 16, height 5\' 6"  (1.676 m), weight 181 lb (82.1 kg), SpO2 99 %.  Medications Current Outpatient Prescriptions on File Prior to Visit  Medication Sig  . diphenhydramine-acetaminophen (TYLENOL PM) 25-500 MG TABS tablet Take 1 tablet by mouth at bedtime as needed (pain/sleep).   . triamcinolone cream (KENALOG) 0.1 % Apply 1 application topically 3 (three) times daily as needed (rash).  . Vitamin D, Ergocalciferol, (DRISDOL) 50000 units CAPS capsule TAKE ONE CAPSULE BY MOUTH twice a week   No current facility-administered medications on file prior to visit.     Problem list She has GERD; Constipation; History of peptic ulcer disease; Family history of malignant neoplasm of gastrointestinal tract; Benign neoplasm of colon; Mobitz type 2 second degree and high-grade heart block; Hyperlipidemia; Vitamin D deficiency; Atrial fibrillation ([place]); HTN (hypertension); Abnormal glucose; Medication management; and Obesity on her problem list.   Review of Systems  Constitutional: Negative.  Negative for chills, fatigue and fever.  HENT: Negative.   Eyes: Negative.   Respiratory: Negative.   Cardiovascular: Negative.   Gastrointestinal: Negative.   Genitourinary: Positive for dyspareunia (deep penatration), pelvic pain (suprapubic) and vaginal discharge. Negative  for decreased urine volume, difficulty urinating, dysuria, enuresis, flank pain, frequency, genital sores, hematuria, menstrual problem, urgency, vaginal bleeding and vaginal pain.  Musculoskeletal: Negative.   Neurological: Negative.        Objective:   Physical Exam  Constitutional: She is oriented to person, place, and time. She appears well-developed and well-nourished.  HENT:  Head: Normocephalic and atraumatic.  Right Ear: External ear normal.  Left Ear: External ear normal.  Mouth/Throat: Oropharynx is clear and moist.  Eyes: Conjunctivae and EOM are normal. Pupils are equal, round, and reactive to light.  Neck: Normal range of motion. Neck supple. No thyromegaly present.  Cardiovascular: Normal rate, regular rhythm and normal heart sounds.  Exam reveals no gallop and no friction rub.   No murmur heard. Pulmonary/Chest: Effort normal and breath sounds normal. No respiratory distress. She has no wheezes.  Abdominal: Soft. Bowel sounds are normal. She exhibits no distension and no mass. There is no tenderness. There is no rebound and no guarding.  Genitourinary: There is no rash, tenderness, lesion or injury on the right labia. There is no rash, tenderness, lesion or injury on the left labia. Cervix exhibits discharge (white thin). Cervix exhibits no motion tenderness and no friability. No erythema, tenderness or bleeding in the vagina. No signs of injury around the vagina. Vaginal discharge (white thin, non odourous discharge) found.  Musculoskeletal: Normal range of motion.  Lymphadenopathy:    She has no cervical adenopathy.  Neurological: She is alert and oriented to person, place, and time. She displays normal reflexes. No cranial nerve deficit. Coordination normal.  Skin: Skin is warm and dry.  Psychiatric: She has a normal mood and affect.  Assessment & Plan:   Vaginal discharge Was on recent ABX, possible yeast infection, will get wet prep, since recent new partner  will get STD panel. Discussed safe sex -     GC/Chlamydia Probe Amp -     WET PREP BY MOLECULAR PROBE  Screening for cervical cancer -     Cytology - PAP- due this year with pain with intercourse will get to rule out cervical etiology  Urine abnormality -     Urinalysis, Routine w reflex microscopic -     Urine culture  Screen for STD (sexually transmitted disease) -     HIV antibody -     RPR -     HSV(herpes simplex vrs) 1+2 ab-IgG

## 2016-11-01 ENCOUNTER — Telehealth: Payer: Self-pay | Admitting: Cardiology

## 2016-11-01 ENCOUNTER — Encounter: Payer: BLUE CROSS/BLUE SHIELD | Admitting: *Deleted

## 2016-11-01 LAB — URINALYSIS, ROUTINE W REFLEX MICROSCOPIC
Bilirubin Urine: NEGATIVE
Glucose, UA: NEGATIVE
Hgb urine dipstick: NEGATIVE
Ketones, ur: NEGATIVE
Leukocytes, UA: NEGATIVE
Nitrite: NEGATIVE
Protein, ur: NEGATIVE
Specific Gravity, Urine: 1.015 (ref 1.001–1.035)
pH: 6.5 (ref 5.0–8.0)

## 2016-11-01 LAB — WET PREP BY MOLECULAR PROBE
Candida species: NEGATIVE
Gardnerella vaginalis: NEGATIVE
Trichomonas vaginosis: NEGATIVE

## 2016-11-01 LAB — URINE CULTURE

## 2016-11-01 LAB — HSV(HERPES SIMPLEX VRS) I + II AB-IGG
HSV 1 Glycoprotein G Ab, IgG: 9.16 Index — ABNORMAL HIGH (ref <0.90)
HSV 2 Glycoprotein G Ab, IgG: 22.4 Index — ABNORMAL HIGH (ref <0.90)

## 2016-11-01 LAB — GC/CHLAMYDIA PROBE AMP
CT Probe RNA: NOT DETECTED
GC Probe RNA: NOT DETECTED

## 2016-11-01 LAB — RPR

## 2016-11-01 NOTE — Telephone Encounter (Signed)
Spoke with pt and reminded pt of remote transmission that is due today. Pt verbalized understanding.   

## 2016-11-02 ENCOUNTER — Other Ambulatory Visit: Payer: Self-pay | Admitting: Physician Assistant

## 2016-11-02 ENCOUNTER — Encounter: Payer: Self-pay | Admitting: Cardiology

## 2016-11-02 LAB — CYTOLOGY - PAP
Adequacy: ABSENT
Diagnosis: NEGATIVE
HPV: NOT DETECTED

## 2016-11-02 MED ORDER — FLUCONAZOLE 150 MG PO TABS: 150.0000 mg | ORAL_TABLET | Freq: Every day | ORAL | 3 refills | Status: DC

## 2016-11-07 ENCOUNTER — Ambulatory Visit (INDEPENDENT_AMBULATORY_CARE_PROVIDER_SITE_OTHER): Payer: BLUE CROSS/BLUE SHIELD | Admitting: *Deleted

## 2016-11-07 DIAGNOSIS — I441 Atrioventricular block, second degree: Secondary | ICD-10-CM | POA: Diagnosis not present

## 2016-11-08 ENCOUNTER — Encounter: Payer: Self-pay | Admitting: Cardiology

## 2016-11-08 NOTE — Progress Notes (Signed)
Remote pacemaker transmission.   

## 2016-11-20 LAB — CUP PACEART REMOTE DEVICE CHECK
Battery Remaining Longevity: 139 mo
Battery Remaining Percentage: 95.5 %
Battery Voltage: 2.99 V
Brady Statistic AP VP Percent: 38 %
Brady Statistic AP VS Percent: 6.8 %
Brady Statistic AS VP Percent: 49 %
Brady Statistic AS VS Percent: 5.7 %
Brady Statistic RA Percent Paced: 45 %
Brady Statistic RV Percent Paced: 87 %
Date Time Interrogation Session: 20180131161819
Implantable Lead Implant Date: 20140408
Implantable Lead Implant Date: 20140408
Implantable Lead Location: 753859
Implantable Lead Location: 753860
Implantable Lead Model: 1944
Implantable Lead Model: 1948
Implantable Pulse Generator Implant Date: 20140408
Lead Channel Impedance Value: 440 Ohm
Lead Channel Impedance Value: 630 Ohm
Lead Channel Pacing Threshold Amplitude: 0.5 V
Lead Channel Pacing Threshold Amplitude: 0.5 V
Lead Channel Pacing Threshold Pulse Width: 0.4 ms
Lead Channel Pacing Threshold Pulse Width: 0.4 ms
Lead Channel Sensing Intrinsic Amplitude: 1.2 mV
Lead Channel Sensing Intrinsic Amplitude: 12 mV
Lead Channel Setting Pacing Amplitude: 0.75 V
Lead Channel Setting Pacing Amplitude: 1.5 V
Lead Channel Setting Pacing Pulse Width: 0.4 ms
Lead Channel Setting Sensing Sensitivity: 2 mV
Pulse Gen Model: 2210
Pulse Gen Serial Number: 7457437

## 2016-11-22 ENCOUNTER — Encounter: Payer: Self-pay | Admitting: Cardiology

## 2017-01-07 ENCOUNTER — Encounter: Payer: Self-pay | Admitting: Internal Medicine

## 2017-01-07 ENCOUNTER — Ambulatory Visit (INDEPENDENT_AMBULATORY_CARE_PROVIDER_SITE_OTHER): Payer: BLUE CROSS/BLUE SHIELD | Admitting: Internal Medicine

## 2017-01-07 VITALS — BP 126/68 | HR 62 | Temp 98.0°F | Ht 66.0 in | Wt 176.0 lb

## 2017-01-07 DIAGNOSIS — M25562 Pain in left knee: Secondary | ICD-10-CM

## 2017-01-07 DIAGNOSIS — K644 Residual hemorrhoidal skin tags: Secondary | ICD-10-CM | POA: Diagnosis not present

## 2017-01-07 MED ORDER — DEXAMETHASONE SODIUM PHOSPHATE 10 MG/ML IJ SOLN
10.0000 mg | Freq: Once | INTRAMUSCULAR | Status: AC
  Administered 2017-01-07: 10 mg via INTRAMUSCULAR

## 2017-01-07 MED ORDER — HYDROCORTISONE ACETATE 25 MG RE SUPP
25.0000 mg | Freq: Two times a day (BID) | RECTAL | 0 refills | Status: DC
Start: 2017-01-07 — End: 2017-02-27

## 2017-01-07 NOTE — Progress Notes (Signed)
Assessment and Plan:   1. Acute pain of left knee -likely OA from rapid increase of exercise and activity -change out shoes -decrease milage by 40%, can slowly work up by increasing no more than 10% per week -not an NSAID candidate due to previous PUD. - dexamethasone (DECADRON) injection 10 mg; Inject 1 mL (10 mg total) into the muscle once.  2. External hemorrhoid -annusol -increase fiber -avoid straining -sitz baths    HPI 61 y.o.female presents for 3 days of rectal bleeding.  She reports that she has been having some minimal spotting and bleeding form the skin.  She did try to use some vaseline on it.  She strained a couple days ago.    She reports that her left knee and foot is bothering her.  She reports that she has been walking more on her treadmill for 3 miles.  She reports that she just changed out her shoes out.  She has taken a break from the walking.   She has not been icing or heating.  She is not taking any ibuprofen or tylenol.  She reports that she did take a codeine yesterday.  She reports that it was worse in high heels.  She has not had problems with her knee or leg in the past.  No leg swelling.    Past Medical History:  Diagnosis Date  . Hyperlipidemia   . Paroxysmal atrial fibrillation ([place]) 6/15   detected on ppm interrogation  . PONV (postoperative nausea and vomiting)    has vomitted in past  . Second degree Mobitz II AV block   . Vitamin D deficiency      No Known Allergies    Current Outpatient Prescriptions on File Prior to Visit  Medication Sig Dispense Refill  . diphenhydramine-acetaminophen (TYLENOL PM) 25-500 MG TABS tablet Take 1 tablet by mouth at bedtime as needed (pain/sleep).     . triamcinolone cream (KENALOG) 0.1 % Apply 1 application topically 3 (three) times daily as needed (rash).    . Vitamin D, Ergocalciferol, (DRISDOL) 50000 units CAPS capsule TAKE ONE CAPSULE BY MOUTH twice a week 60 capsule 3   No current facility-administered  medications on file prior to visit.     Review of Systems  Constitutional: Negative for chills, fever and malaise/fatigue.  HENT: Negative for congestion, hearing loss, nosebleeds and sore throat.   Eyes: Negative.   Respiratory: Negative for cough, shortness of breath and wheezing.   Cardiovascular: Negative for chest pain, palpitations and leg swelling.  Gastrointestinal: Positive for blood in stool and constipation. Negative for abdominal pain, diarrhea, heartburn, melena, nausea and vomiting.  Genitourinary: Negative.      Physical Exam: Filed Weights   01/07/17 1406  Weight: 176 lb (79.8 kg)   BP 126/68   Pulse 62   Temp 98 F (36.7 C) (Temporal)   Ht 5\' 6"  (1.676 m)   Wt 176 lb (79.8 kg)   BMI 28.41 kg/m  General Appearance: Well developed well nourished, non-toxic appearing in no apparent distress. Eyes: PERRLA, EOMs, conjunctiva w/ no swelling or erythema or discharge Sinuses: No Frontal/maxillary tenderness ENT/Mouth: Ear canals clear without swelling or erythema.  TM's normal bilaterally with no retractions, bulging, or loss of landmarks.   Neck: Supple, thyroid normal, no notable JVD  Respiratory: Respiratory effort normal, Clear breath sounds anteriorly and posteriorly bilaterally without rales, rhonchi, wheezing or stridor. No retractions or accessory muscle usage. Cardio: RRR with no MRGs.   Abdomen: Soft, + BS.  Non  tender, no guarding, rebound, hernias, masses. Rectum:  Single external non-thrombosed erythematous and bleeding hemorrhoid.  Tender to palpation.    Musculoskeletal: Left knee with minimal joint line TTP.  Normal and full passive and active ROM.  5/5 strength.  Normal ligamentous testing.  Negative McMurrays.  Negative holmans sign.  No pretibial edema or erythema.  Full ROM, 5/5 strength, normal gait.  Skin: Warm, dry without rashes  Neuro: Awake and oriented X 3, Cranial nerves intact. Normal muscle tone, no cerebellar symptoms. Sensation intact.   Psych: normal affect, Insight and Judgment appropriate.     Starlyn Skeans, PA-C 2:16 PM [facility]

## 2017-01-07 NOTE — Patient Instructions (Signed)
Hemorrhoids Hemorrhoids are swollen veins in and around the rectum or anus. Hemorrhoids can cause pain, itching, or bleeding. Most of the time, they do not cause serious problems. They usually get better with diet changes, lifestyle changes, and other home treatments. Follow these instructions at home: Eating and drinking   Eat foods that have fiber, such as whole grains, beans, nuts, fruits, and vegetables. Ask your doctor about taking products that have added fiber (fibersupplements).  Drink enough fluid to keep your pee (urine) clear or pale yellow. For Pain and Swelling   Take a warm-water bath (sitz bath) for 20 minutes to ease pain. Do this 3-4 times a day.  If directed, put ice on the painful area. It may be helpful to use ice between your warm baths.  Put ice in a plastic bag.  Place a towel between your skin and the bag.  Leave the ice on for 20 minutes, 2-3 times a day. General instructions   Take over-the-counter and prescription medicines only as told by your doctor.  Medicated creams and medicines that are inserted into the anus (suppositories) may be used or applied as told.  Exercise often.  Go to the bathroom when you have the urge to poop (to have a bowel movement). Do not wait.  Avoid pushing too hard (straining) when you poop.  Keep the butt area dry and clean. Use wet toilet paper or moist paper towels.  Do not sit on the toilet for a long time. Contact a doctor if:  You have any of these:  Pain and swelling that do not get better with treatment or medicine.  Bleeding that will not stop.  Trouble pooping or you cannot poop.  Pain or swelling outside the area of the hemorrhoids. This information is not intended to replace advice given to you by your health care provider. Make sure you discuss any questions you have with your health care provider. Document Released: 07/03/2008 Document Revised: 03/01/2016 Document Reviewed: 06/08/2015 Elsevier  Interactive Patient Education  2017 Elsevier Inc.  

## 2017-01-09 DIAGNOSIS — Z23 Encounter for immunization: Secondary | ICD-10-CM | POA: Diagnosis not present

## 2017-02-07 ENCOUNTER — Ambulatory Visit (INDEPENDENT_AMBULATORY_CARE_PROVIDER_SITE_OTHER): Payer: BLUE CROSS/BLUE SHIELD | Admitting: *Deleted

## 2017-02-07 DIAGNOSIS — I441 Atrioventricular block, second degree: Secondary | ICD-10-CM

## 2017-02-07 NOTE — Progress Notes (Signed)
Remote pacemaker transmission.   

## 2017-02-08 LAB — CUP PACEART REMOTE DEVICE CHECK
Battery Remaining Longevity: 140 mo
Battery Remaining Percentage: 95.5 %
Battery Voltage: 2.99 V
Brady Statistic AP VP Percent: 39 %
Brady Statistic AP VS Percent: 7.2 %
Brady Statistic AS VP Percent: 47 %
Brady Statistic AS VS Percent: 6.1 %
Brady Statistic RA Percent Paced: 46 %
Brady Statistic RV Percent Paced: 87 %
Date Time Interrogation Session: 20180503075210
Implantable Lead Implant Date: 20140408
Implantable Lead Implant Date: 20140408
Implantable Lead Location: 753859
Implantable Lead Location: 753860
Implantable Lead Model: 1944
Implantable Lead Model: 1948
Implantable Pulse Generator Implant Date: 20140408
Lead Channel Impedance Value: 450 Ohm
Lead Channel Impedance Value: 650 Ohm
Lead Channel Pacing Threshold Amplitude: 0.5 V
Lead Channel Pacing Threshold Amplitude: 0.5 V
Lead Channel Pacing Threshold Pulse Width: 0.4 ms
Lead Channel Pacing Threshold Pulse Width: 0.4 ms
Lead Channel Sensing Intrinsic Amplitude: 12 mV
Lead Channel Sensing Intrinsic Amplitude: 3 mV
Lead Channel Setting Pacing Amplitude: 0.75 V
Lead Channel Setting Pacing Amplitude: 1.5 V
Lead Channel Setting Pacing Pulse Width: 0.4 ms
Lead Channel Setting Sensing Sensitivity: 2 mV
Pulse Gen Model: 2210
Pulse Gen Serial Number: 7457437

## 2017-02-15 ENCOUNTER — Encounter: Payer: Self-pay | Admitting: Cardiology

## 2017-02-26 NOTE — Progress Notes (Signed)
Complete Physical  Assessment and Plan:  Essential hypertension - continue medications, DASH diet, exercise and monitor at home. Call if greater than 130/80. - CBC with Differential/Platelet - BASIC METABOLIC PANEL WITH GFR - TSH - Hepatic function panel - Urinalysis, Routine w reflex microscopic - Microalbumin / creatinine urine ratio   Hyperlipidemia -continue medications, check lipids, decrease fatty foods, increase activity.  - Lipid panel   Obesity Obesity with co morbidities- long discussion about weight loss, diet, and exercise  Mobitz type 2 second degree and high-grade heart block Has pacemaker   Paroxysmal atrial fibrillation Not on coagulation, following with Cardio  Gastroesophageal reflux disease without esophagitis Continue PPI/H2 blocker, diet discussed   Abnormal glucose Discussed general issues about diabetes pathophysiology and management., Educational material distributed., Suggested low cholesterol diet., Encouraged aerobic exercise., Discussed foot care., Reminded to get yearly retinal exam. - Hemoglobin A1c - Insulin, fasting   Vitamin D deficiency May need to increase to 3 times a week.  - Vit D  25 hydroxy (rtn osteoporosis monitoring)  Medication management - Magnesium   History of peptic ulcer disease Avoid NSAIDS   Family history of malignant neoplasm of gastrointestinal tract monitor   Constipation, unspecified constipation type Better with increase exercise   Benign neoplasm of colon Due 2018- will call  Routine general medical examination at a health care facility   Discussed med's effects and SE's. Screening labs and tests as requested with regular follow-up as recommended. Over 40 minutes of exam, counseling, chart review, and critical decision making was performed this visit.   HPI  61 y.Alyssa. female  presents for a complete physical.  Her blood pressure has been controlled at home, today their BP is BP: 124/86 She does  workout, walking and running and done a 10 K.  She denies chest pain, shortness of breath, dizziness.  She has Afib and mobitz type 2, follows with Dr. Rayann Dudley and Alyssa Dudley, low risk CHADSVASC 1 and not on anticoagulation.  She is not on cholesterol medication, states that last time she was not on it. Her cholesterol is not at goal. The cholesterol last visit was:   Lab Results  Component Value Date   CHOL 178 02/27/2016   HDL 53 02/27/2016   LDLCALC 111 02/27/2016   TRIG 71 02/27/2016   CHOLHDL 3.4 02/27/2016    She has been working on diet and exercise for prediabetes and has done a good job of decreasing her A1C, and denies paresthesia of the feet, polydipsia, polyuria and visual disturbances. Last A1C in the office was:  Lab Results  Component Value Date   HGBA1C 5.5 02/27/2016  Patient is on Vitamin D supplement, 50,000 IU only once a week.   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 Graduated from [employer] in May. She states she has not been sleeping well.   BMI is Body mass index is 28.84 kg/m., she is working on diet and exercise. Wt Readings from Last 3 Encounters:  02/27/17 176 lb (79.8 kg)  01/07/17 176 lb (79.8 kg)  10/31/16 181 lb (82.1 kg)     Current Medications:  Current Outpatient Prescriptions on File Prior to Visit  Medication Sig Dispense Refill  . diphenhydramine-acetaminophen (TYLENOL PM) 25-500 MG TABS tablet Take 1 tablet by mouth at bedtime as needed (pain/sleep).     . triamcinolone cream (KENALOG) 0.1 % Apply 1  application topically 3 (three) times daily as needed (rash).    . Vitamin D, Ergocalciferol, (DRISDOL) 50000 units CAPS capsule TAKE ONE CAPSULE BY MOUTH twice a week 60 capsule 3   No current facility-administered medications on file prior to visit.    Health Maintenance:   Immunization History  Administered Date(s) Administered  .  Influenza Split 06/30/2013, 07/20/2014, 07/21/2015  . Influenza,inj,quad, With Preservative 07/05/2016  . PPD Test 02/22/2014, 05/10/2015  . Tdap 10/09/2011  . Zoster Recombinat (Shingrix) 01/09/2017   Colonoscopy: 10/15/11 due 2018 with polyps and FHX Mammo: 08/2016 BMD: 2015 WNL will wait until closer to 66 Pap/ Pelvic: 2018 WNL AB [place] 2011 CXR 2014 Echo 01/2013  EYE: Dr. Peter Dudley yearly WNL Dentist: Dr. Marga Dudley 4month IMMUNIZATIONS: Tdap:2013 Pneumovax n/a: Prevnar 13: due at 89Shingrix: 2018 Influenza:2017  Patient Care Team: Alyssa Pinto MD as PCP - General (Internal Medicine) Alyssa Feil MD as Consulting Physician (Gastroenterology) KCamillo Dudley OSheridanas Referring Physician (Optometry) RFay Records MD as Consulting Physician (Cardiology) KDaryll Brod MD as Consulting Physician (Orthopedic Surgery)  Medical History:  Past Medical History:  Diagnosis Date  . Hyperlipidemia   . Paroxysmal atrial fibrillation ([place] 6/15   detected on ppm interrogation  . Alyssa Dudley (postoperative nausea and vomiting)    has vomitted in past  . Second degree Mobitz II AV block   . Vitamin D deficiency    Allergies No Known Allergies  SURGICAL HISTORY She  has a past surgical history that includes Breast cyst excision (1980); Wrist surgery (2011); and permanent pacemaker insertion (N/A, 01/13/2013). FAMILY HISTORY Her family history includes Cervical cancer in her mother; Colon cancer (age of onset: 51 in her sister; Diabetes in her mother; Heart disease in her father; Hyperlipidemia in her father; Hypertension in her mother and son; Kidney disease in her son; Multiple myeloma in her maternal aunt; Multiple myeloma (age of onset: 386 in her son; Prostate cancer (age of onset: 613 in her brother. SOCIAL HISTORY She  reports that she has never smoked. She has never used smokeless tobacco. She reports that she does not drink alcohol or use drugs.  Review of Systems: Review of  Systems  Constitutional: Negative.   HENT: Negative.   Eyes: Negative.   Respiratory: Negative.   Cardiovascular: Negative.   Gastrointestinal: Negative.   Genitourinary: Negative.   Musculoskeletal: Negative.   Skin: Negative.   Neurological: Negative.   Endo/Heme/Allergies: Negative.   Psychiatric/Behavioral: Negative for depression, hallucinations, memory loss, substance abuse and suicidal ideas. The patient is not nervous/anxious and does not have insomnia.    Physical Exam: Estimated body mass index is 28.84 kg/m as calculated from the following:   Height as of this encounter: 5' 5.5" (1.664 m).   Weight as of this encounter: 176 lb (79.8 kg). BP 124/86   Pulse 100   Temp 97.5 F (36.4 C)   Resp 14   Ht 5' 5.5" (1.664 m)   Wt 176 lb (79.8 kg)   SpO2 99%   BMI 28.84 kg/m  General Appearance: Well nourished, in no apparent distress.  Eyes: PERRLA, EOMs, conjunctiva no swelling or erythema, normal fundi and vessels.  Sinuses: No Frontal/maxillary tenderness  ENT/Mouth: Ext aud canals clear, normal light reflex with TMs without erythema, bulging. Good dentition. No erythema, swelling, or exudate on post pharynx. Tonsils not swollen or erythematous. Hearing normal.  Neck: Supple, thyroid normal. No bruits  Respiratory: Respiratory effort normal, BS equal bilaterally without rales, rhonchi, wheezing or  stridor.  Cardio: RRR without murmurs, rubs or gallops. Brisk peripheral pulses without edema.  Chest: symmetric, with normal excursions and percussion.  Breasts: Symmetric, without lumps, nipple discharge, retractions.  Abdomen: Soft, nontender, no guarding, rebound, hernias, masses, or organomegaly.  Lymphatics: Non tender without lymphadenopathy.  Genitourinary: defer Musculoskeletal: Full ROM all peripheral extremities,5/5 strength, and normal gait.  Skin: Warm, dry without rashes, lesions, ecchymosis. Neuro: Cranial nerves intact, reflexes equal bilaterally. Normal muscle  tone, no cerebellar symptoms. Sensation intact.  Psych: Awake and oriented X 3, normal affect, Insight and Judgment appropriate.   EKG: defer, follows cardiology  Alyssa Dudley 10:23 AM [facility]

## 2017-02-27 ENCOUNTER — Ambulatory Visit (INDEPENDENT_AMBULATORY_CARE_PROVIDER_SITE_OTHER): Payer: BLUE CROSS/BLUE SHIELD | Admitting: Physician Assistant

## 2017-02-27 ENCOUNTER — Encounter: Payer: Self-pay | Admitting: Physician Assistant

## 2017-02-27 VITALS — BP 124/86 | HR 100 | Temp 97.5°F | Resp 14 | Ht 65.5 in | Wt 176.0 lb

## 2017-02-27 DIAGNOSIS — E785 Hyperlipidemia, unspecified: Secondary | ICD-10-CM

## 2017-02-27 DIAGNOSIS — K219 Gastro-esophageal reflux disease without esophagitis: Secondary | ICD-10-CM

## 2017-02-27 DIAGNOSIS — R7309 Other abnormal glucose: Secondary | ICD-10-CM

## 2017-02-27 DIAGNOSIS — I10 Essential (primary) hypertension: Secondary | ICD-10-CM

## 2017-02-27 DIAGNOSIS — D649 Anemia, unspecified: Secondary | ICD-10-CM

## 2017-02-27 DIAGNOSIS — Z800 Family history of malignant neoplasm of digestive organs: Secondary | ICD-10-CM

## 2017-02-27 DIAGNOSIS — I441 Atrioventricular block, second degree: Secondary | ICD-10-CM

## 2017-02-27 DIAGNOSIS — Z79899 Other long term (current) drug therapy: Secondary | ICD-10-CM

## 2017-02-27 DIAGNOSIS — D126 Benign neoplasm of colon, unspecified: Secondary | ICD-10-CM

## 2017-02-27 DIAGNOSIS — I480 Paroxysmal atrial fibrillation: Secondary | ICD-10-CM

## 2017-02-27 DIAGNOSIS — E559 Vitamin D deficiency, unspecified: Secondary | ICD-10-CM | POA: Diagnosis not present

## 2017-02-27 DIAGNOSIS — Z0001 Encounter for general adult medical examination with abnormal findings: Secondary | ICD-10-CM

## 2017-02-27 DIAGNOSIS — Z0000 Encounter for general adult medical examination without abnormal findings: Secondary | ICD-10-CM

## 2017-02-27 DIAGNOSIS — Z8711 Personal history of peptic ulcer disease: Secondary | ICD-10-CM

## 2017-02-27 DIAGNOSIS — K5900 Constipation, unspecified: Secondary | ICD-10-CM

## 2017-02-27 LAB — HEPATIC FUNCTION PANEL
ALT: 12 U/L (ref 6–29)
AST: 16 U/L (ref 10–35)
Albumin: 4.2 g/dL (ref 3.6–5.1)
Alkaline Phosphatase: 102 U/L (ref 33–130)
Bilirubin, Direct: 0.2 mg/dL (ref <=0.2)
Indirect Bilirubin: 0.6 mg/dL (ref 0.2–1.2)
Total Bilirubin: 0.8 mg/dL (ref 0.2–1.2)
Total Protein: 7.2 g/dL (ref 6.1–8.1)

## 2017-02-27 LAB — CBC WITH DIFFERENTIAL/PLATELET
Basophils Absolute: 41 cells/uL (ref 0–200)
Basophils Relative: 1 %
Eosinophils Absolute: 82 cells/uL (ref 15–500)
Eosinophils Relative: 2 %
HCT: 41.6 % (ref 35.0–45.0)
Hemoglobin: 13.5 g/dL (ref 11.7–15.5)
Lymphocytes Relative: 50 %
Lymphs Abs: 2050 cells/uL (ref 850–3900)
MCH: 29.5 pg (ref 27.0–33.0)
MCHC: 32.5 g/dL (ref 32.0–36.0)
MCV: 90.8 fL (ref 80.0–100.0)
MPV: 9.9 fL (ref 7.5–12.5)
Monocytes Absolute: 451 cells/uL (ref 200–950)
Monocytes Relative: 11 %
Neutro Abs: 1476 cells/uL — ABNORMAL LOW (ref 1500–7800)
Neutrophils Relative %: 36 %
Platelets: 232 10*3/uL (ref 140–400)
RBC: 4.58 MIL/uL (ref 3.80–5.10)
RDW: 14.3 % (ref 11.0–15.0)
WBC: 4.1 10*3/uL (ref 3.8–10.8)

## 2017-02-27 LAB — BASIC METABOLIC PANEL WITH GFR
BUN: 10 mg/dL (ref 7–25)
CO2: 26 mmol/L (ref 20–31)
Calcium: 9.5 mg/dL (ref 8.6–10.4)
Chloride: 106 mmol/L (ref 98–110)
Creat: 0.77 mg/dL (ref 0.50–1.05)
GFR, Est African American: >89 mL/min (ref >=60)
GFR, Est Non African American: 85 mL/min (ref >=60)
Glucose, Bld: 82 mg/dL (ref 65–99)
Potassium: 4.8 mmol/L (ref 3.5–5.3)
Sodium: 142 mmol/L (ref 135–146)

## 2017-02-27 LAB — LIPID PANEL
Cholesterol: 195 mg/dL (ref <200)
HDL: 59 mg/dL (ref >50)
LDL Cholesterol: 127 mg/dL — ABNORMAL HIGH (ref <100)
Total CHOL/HDL Ratio: 3.3 Ratio (ref <5.0)
Triglycerides: 45 mg/dL (ref <150)
VLDL: 9 mg/dL (ref <30)

## 2017-02-27 LAB — IRON AND TIBC
%SAT: 29 % (ref 11–50)
Iron: 98 ug/dL (ref 45–160)
TIBC: 334 ug/dL (ref 250–450)
UIBC: 236 ug/dL

## 2017-02-27 NOTE — Patient Instructions (Addendum)
DUE FOR COLONOSCOPY Phone: [phone] Call for colonoscopy   Simple math prevails.    1st - exercise does not produce significant weight loss - at best one converts fat into muscle , "bulks up", loses inches, but usually stays "weight neutral"     2nd - think of your body weightas a check book: If you eat more calories than you burn up - you save money or gain weight .... Or if you spend more money than you put in the check book, ie burn up more calories than you eat, then you lose weight     3rd - if you walk or run 1 mile, you burn up 100 calories - you have to burn up 3,500 calories to lose 1 pound, ie you have to walk/run 35 miles to lose 1 measly pound. So if you want to lose 10 #, then you have to walk/run 350 miles, so.... clearly exercise is not the solution.     4. So if you consume 1,500 calories, then you have to burn up the equivalent of 15 miles to stay weight neutral - It also stands to reason that if you consume 1,500 cal/day and don't lose weight, then you must be burning up about 1,500 cals/day to stay weight neutral.     5. If you really want to lose weight, you must cut your calorie intake 300 calories /day and at that rate you should lose about 1 # every 3 days.   6. Please purchase Dr Fara Olden Fuhrman's book(s) "The End of Dieting" & "Eat to Live" . It has some great concepts and recipes.

## 2017-02-28 LAB — VITAMIN D 25 HYDROXY (VIT D DEFICIENCY, FRACTURES): Vit D, 25-Hydroxy: 57 ng/mL (ref 30–100)

## 2017-02-28 LAB — MAGNESIUM: Magnesium: 1.9 mg/dL (ref 1.5–2.5)

## 2017-02-28 LAB — VITAMIN B12: Vitamin B-12: 463 pg/mL (ref 200–1100)

## 2017-02-28 LAB — TSH: TSH: 2.18 mIU/L

## 2017-03-01 ENCOUNTER — Encounter: Payer: Self-pay | Admitting: Cardiology

## 2017-03-15 DIAGNOSIS — H40013 Open angle with borderline findings, low risk, bilateral: Secondary | ICD-10-CM | POA: Diagnosis not present

## 2017-05-09 ENCOUNTER — Telehealth: Payer: Self-pay | Admitting: Cardiology

## 2017-05-09 ENCOUNTER — Encounter: Payer: BLUE CROSS/BLUE SHIELD | Admitting: *Deleted

## 2017-05-09 NOTE — Telephone Encounter (Signed)
LMOVM reminding pt to send remote transmission.   

## 2017-05-10 ENCOUNTER — Encounter: Payer: Self-pay | Admitting: Cardiology

## 2017-05-13 ENCOUNTER — Ambulatory Visit (INDEPENDENT_AMBULATORY_CARE_PROVIDER_SITE_OTHER): Payer: BLUE CROSS/BLUE SHIELD | Admitting: *Deleted

## 2017-05-13 DIAGNOSIS — I441 Atrioventricular block, second degree: Secondary | ICD-10-CM

## 2017-05-14 NOTE — Progress Notes (Signed)
Remote pacemaker transmission.   

## 2017-05-15 ENCOUNTER — Encounter: Payer: Self-pay | Admitting: Cardiology

## 2017-05-17 LAB — CUP PACEART REMOTE DEVICE CHECK
Battery Remaining Longevity: 143 mo
Battery Remaining Percentage: 95.5 %
Battery Voltage: 2.98 V
Brady Statistic AP VP Percent: 40 %
Brady Statistic AP VS Percent: 7 %
Brady Statistic AS VP Percent: 47 %
Brady Statistic AS VS Percent: 5.8 %
Brady Statistic RA Percent Paced: 46 %
Brady Statistic RV Percent Paced: 87 %
Date Time Interrogation Session: 20180804042245
Implantable Lead Implant Date: 20140408
Implantable Lead Implant Date: 20140408
Implantable Lead Location: 753859
Implantable Lead Location: 753860
Implantable Lead Model: 1944
Implantable Lead Model: 1948
Implantable Pulse Generator Implant Date: 20140408
Lead Channel Impedance Value: 510 Ohm
Lead Channel Impedance Value: 750 Ohm
Lead Channel Pacing Threshold Amplitude: 0.375 V
Lead Channel Pacing Threshold Amplitude: 0.5 V
Lead Channel Pacing Threshold Pulse Width: 0.4 ms
Lead Channel Pacing Threshold Pulse Width: 0.4 ms
Lead Channel Sensing Intrinsic Amplitude: 12 mV
Lead Channel Sensing Intrinsic Amplitude: 5 mV
Lead Channel Setting Pacing Amplitude: 0.75 V
Lead Channel Setting Pacing Amplitude: 1.375
Lead Channel Setting Pacing Pulse Width: 0.4 ms
Lead Channel Setting Sensing Sensitivity: 2 mV
Pulse Gen Model: 2210
Pulse Gen Serial Number: 7457437

## 2017-06-03 ENCOUNTER — Encounter: Payer: Self-pay | Admitting: Cardiology

## 2017-06-14 DIAGNOSIS — H40013 Open angle with borderline findings, low risk, bilateral: Secondary | ICD-10-CM | POA: Diagnosis not present

## 2017-06-27 ENCOUNTER — Ambulatory Visit: Payer: Self-pay

## 2017-07-05 DIAGNOSIS — Z1231 Encounter for screening mammogram for malignant neoplasm of breast: Secondary | ICD-10-CM | POA: Diagnosis not present

## 2017-07-25 ENCOUNTER — Ambulatory Visit: Payer: Self-pay | Admitting: Adult Health

## 2017-07-26 ENCOUNTER — Ambulatory Visit (INDEPENDENT_AMBULATORY_CARE_PROVIDER_SITE_OTHER): Payer: BLUE CROSS/BLUE SHIELD | Admitting: Internal Medicine

## 2017-07-26 VITALS — BP 128/84 | HR 64 | Temp 97.3°F | Resp 16 | Ht 65.5 in | Wt 179.2 lb

## 2017-07-26 DIAGNOSIS — E559 Vitamin D deficiency, unspecified: Secondary | ICD-10-CM

## 2017-07-26 DIAGNOSIS — E782 Mixed hyperlipidemia: Secondary | ICD-10-CM

## 2017-07-26 DIAGNOSIS — Z23 Encounter for immunization: Secondary | ICD-10-CM

## 2017-07-26 DIAGNOSIS — R7303 Prediabetes: Secondary | ICD-10-CM

## 2017-07-26 DIAGNOSIS — I10 Essential (primary) hypertension: Secondary | ICD-10-CM

## 2017-07-26 DIAGNOSIS — Z79899 Other long term (current) drug therapy: Secondary | ICD-10-CM | POA: Diagnosis not present

## 2017-07-26 DIAGNOSIS — M722 Plantar fascial fibromatosis: Secondary | ICD-10-CM

## 2017-07-26 MED ORDER — PREDNISONE 20 MG PO TABS: ORAL_TABLET | ORAL | 0 refills | Status: DC

## 2017-07-26 NOTE — Progress Notes (Signed)
This very nice 61 y.o. DBF presents for  follow up with Hypertension, Hyperlipidemia, Pre-Diabetes and Vitamin D Deficiency. She also presents with Lt Heel pain .     Patient is followed expectantly for labile  HTN & BP has been controlled at home. Today's BP  is at goal - 128/84. Patient has had no complaints of any cardiac type chest pain, palpitations, dyspnea / orthopnea / PND, dizziness, claudication, or dependent edema.     Hyperlipidemia is not controlled with diets. Patient did have control of her lipids when taking Pravastatin, but she self discontinued her meds for fear that she might possibly develop side-effects. She denies myalgias or other med SE's. Last Lipids were NOT at goal: Lab Results  Component Value Date   CHOL 195 02/27/2017   HDL 59 02/27/2017   LDLCALC 127 (H) 02/27/2017   TRIG 45 02/27/2017   CHOLHDL 3.3 02/27/2017      Also, the patient has history of PreDiabetes (A1c 5.8% in 2015) and has had no symptoms of reactive hypoglycemia, diabetic polys, paresthesias or visual blurring.  Last A1c was at goal: Lab Results  Component Value Date   HGBA1C 5.5 02/27/2016      Further, the patient also has history of Vitamin D Deficiency ("34" in 2015) and supplements vitamin D without any suspected side-effects. Last vitamin D was near goal: Lab Results  Component Value Date   VD25OH 57 02/27/2017   Current Outpatient Prescriptions on File Prior to Visit  Medication Sig  . diphenhydramine-acetaminophen (TYLENOL PM) 25-500 MG TABS tablet Take 1 tablet by mouth at bedtime as needed (pain/sleep).   . triamcinolone cream (KENALOG) 0.1 % Apply 1 application topically 3 (three) times daily as needed (rash).  . Vitamin D, Ergocalciferol, (DRISDOL) 50000 units CAPS capsule TAKE ONE CAPSULE BY MOUTH twice a week   No current facility-administered medications on file prior to visit.    No Known Allergies   PMHx:   Past Medical History:  Diagnosis Date  . Hyperlipidemia    . Paroxysmal atrial fibrillation ([place]) 6/15   detected on ppm interrogation  . PONV (postoperative nausea and vomiting)    has vomitted in past  . Second degree Mobitz II AV block   . Vitamin D deficiency    Immunization History  Administered Date(s) Administered  . Influenza Split 06/30/2013, 07/20/2014, 07/21/2015  . Influenza,inj,quad, With Preservative 07/05/2016  . PPD Test 02/22/2014, 05/10/2015  . Tdap 10/09/2011  . Zoster Recombinat (Shingrix) 01/09/2017   Past Surgical History:  Procedure Laterality Date  . BREAST CYST EXCISION  1980  . PERMANENT PACEMAKER INSERTION N/A 01/13/2013   SJM [employer] DR RF implanted by Dr [name] for mobitz II AV block  . WRIST SURGERY  2011   removed cyst   FHx:    Reviewed / unchanged  SHx:    Reviewed / unchanged  Systems Review:  Constitutional: Denies fever, chills, wt changes, headaches, insomnia, fatigue, night sweats, change in appetite. Eyes: Denies redness, blurred vision, diplopia, discharge, itchy, watery eyes.  ENT: Denies discharge, congestion, post nasal drip, epistaxis, sore throat, earache, hearing loss, dental pain, tinnitus, vertigo, sinus pain, snoring.  CV: Denies chest pain, palpitations, irregular heartbeat, syncope, dyspnea, diaphoresis, orthopnea, PND, claudication or edema. Respiratory: denies cough, dyspnea, DOE, pleurisy, hoarseness, laryngitis, wheezing.  Gastrointestinal: Denies dysphagia, odynophagia, heartburn, reflux, water brash, abdominal pain or cramps, nausea, vomiting, bloating, diarrhea, constipation, hematemesis, melena, hematochezia  or hemorrhoids. Genitourinary: Denies dysuria, frequency, urgency, nocturia, hesitancy,  discharge, hematuria or flank pain. Musculoskeletal: Denies arthralgias, myalgias, stiffness, jt. swelling, pain, limping or strain/sprain.  Skin: Denies pruritus, rash, hives, warts, acne, eczema or change in skin lesion(s). Neuro: No weakness, tremor, incoordination, spasms, paresthesia  or pain. Psychiatric: Denies confusion, memory loss or sensory loss. Endo: Denies change in weight, skin or hair change.  Heme/Lymph: No excessive bleeding, bruising or enlarged lymph nodes.  Physical Exam  BP 128/84   Pulse 64   Temp (!) 97.3 F (36.3 C)   Resp 16   Ht 5' 5.5" (1.664 m)   Wt 179 lb 3.2 oz (81.3 kg)   BMI 29.37 kg/m   Appears well nourished, well groomed  and in no distress.  Eyes: PERRLA, EOMs, conjunctiva no swelling or erythema. Sinuses: No frontal/maxillary tenderness ENT/Mouth: EAC's clear, TM's nl w/o erythema, bulging. Nares clear w/o erythema, swelling, exudates. Oropharynx clear without erythema or exudates. Oral hygiene is good. Tongue normal, non obstructing. Hearing intact.  Neck: Supple. Thyroid nl. Car 2+/2+ without bruits, nodes or JVD. Chest: Respirations nl with BS clear & equal w/o rales, rhonchi, wheezing or stridor.  Cor: Heart sounds normal w/ regular rate and rhythm without sig. murmurs, gallops, clicks or rubs. Peripheral pulses normal and equal  without edema.  Abdomen: Soft & bowel sounds normal. Non-tender w/o guarding, rebound, hernias, masses or organomegaly.  Lymphatics: Unremarkable.  Musculoskeletal: Full ROM all peripheral extremities, joint stability, 5/5 strength and normal gait. High arches with trigger point tenderness of the Lt calcaneous.  Skin: Warm, dry without exposed rashes, lesions or ecchymosis apparent.  Neuro: Cranial nerves intact, reflexes equal bilaterally. Sensory-motor testing grossly intact. Tendon reflexes grossly intact.  Pysch: Alert & oriented x 3.  Insight and judgement nl & appropriate. No ideations.  Assessment and Plan:  1. Essential hypertension  - Continue medication, monitor blood pressure at home.  - Continue DASH diet. Reminder to go to the ER if any CP,  SOB, nausea, dizziness, severe HA, changes vision/speech. - CBC with Differential/Platelet - BASIC METABOLIC PANEL WITH GFR - Magnesium -  TSH  2. Hyperlipidemia, mixed  - Continue diet/meds, exercise,& lifestyle modifications.  - Continue monitor periodic cholesterol/liver & renal functions  - Hepatic function panel - Lipid panel - TSH  3. Prediabetes  - Continue diet, exercise, lifestyle modifications.  - Monitor appropriate labs.  - Hemoglobin A1c - Insulin, random  4. Vitamin D deficiency  - Continue supplementation.  - VITAMIN D 25 Hydroxy  5. Plantar fasciitis  - advised medium/high arch supports  - predniSONE  20 MG tablet; 1 tab 3 x day for 3 days, then 1 tab 2 x day for 3 days, then 1 tab 1 x day for 5 days  Dispense: 20 tablet; Refill: 0  6. Need for immunization against influenza  - FLU VACCINE MDCK QUAD W/Preservative  7. Medication management  - CBC with Differential/Platelet - BASIC METABOLIC PANEL WITH GFR - Hepatic function panel - Magnesium - Lipid panel - TSH - Hemoglobin A1c - Insulin, random - VITAMIN D 25 Hydroxy         Discussed  regular exercise, BP monitoring, weight control to achieve/maintain BMI less than 25 and discussed med and SE's. Recommended labs to assess and monitor clinical status with further disposition pending results of labs. Over 30 minutes of exam, counseling, chart review was performed.

## 2017-07-26 NOTE — Patient Instructions (Signed)

## 2017-07-27 ENCOUNTER — Other Ambulatory Visit: Payer: Self-pay | Admitting: Internal Medicine

## 2017-07-27 ENCOUNTER — Encounter: Payer: Self-pay | Admitting: Internal Medicine

## 2017-07-27 DIAGNOSIS — E782 Mixed hyperlipidemia: Secondary | ICD-10-CM

## 2017-07-27 MED ORDER — ROSUVASTATIN CALCIUM 40 MG PO TABS: ORAL_TABLET | ORAL | 5 refills | Status: DC

## 2017-07-29 LAB — CBC WITH DIFFERENTIAL/PLATELET
Basophils Absolute: 32 cells/uL (ref 0–200)
Basophils Relative: 0.7 %
Eosinophils Absolute: 129 cells/uL (ref 15–500)
Eosinophils Relative: 2.8 %
HCT: 42 % (ref 35.0–45.0)
Hemoglobin: 14.4 g/dL (ref 11.7–15.5)
Lymphs Abs: 2562 cells/uL (ref 850–3900)
MCH: 30.4 pg (ref 27.0–33.0)
MCHC: 34.3 g/dL (ref 32.0–36.0)
MCV: 88.8 fL (ref 80.0–100.0)
MPV: 10.4 fL (ref 7.5–12.5)
Monocytes Relative: 9.1 %
Neutro Abs: 1458 cells/uL — ABNORMAL LOW (ref 1500–7800)
Neutrophils Relative %: 31.7 %
Platelets: 239 10*3/uL (ref 140–400)
RBC: 4.73 10*6/uL (ref 3.80–5.10)
RDW: 12.9 % (ref 11.0–15.0)
Total Lymphocyte: 55.7 %
WBC mixed population: 419 cells/uL (ref 200–950)
WBC: 4.6 10*3/uL (ref 3.8–10.8)

## 2017-07-29 LAB — BASIC METABOLIC PANEL WITH GFR
BUN: 12 mg/dL (ref 7–25)
CO2: 28 mmol/L (ref 20–32)
Calcium: 9.5 mg/dL (ref 8.6–10.4)
Chloride: 102 mmol/L (ref 98–110)
Creat: 0.68 mg/dL (ref 0.50–0.99)
GFR, Est African American: 110 mL/min/{1.73_m2} (ref >=60)
GFR, Est Non African American: 95 mL/min/{1.73_m2} (ref >=60)
Glucose, Bld: 82 mg/dL (ref 65–99)
Potassium: 4.2 mmol/L (ref 3.5–5.3)
Sodium: 141 mmol/L (ref 135–146)

## 2017-07-29 LAB — TSH: TSH: 2.49 mIU/L (ref 0.40–4.50)

## 2017-07-29 LAB — HEMOGLOBIN A1C
Hgb A1c MFr Bld: 5.3 % of total Hgb (ref <5.7)
Mean Plasma Glucose: 105 (calc)
eAG (mmol/L): 5.8 (calc)

## 2017-07-29 LAB — HEPATIC FUNCTION PANEL
AG Ratio: 1.5 (calc) (ref 1.0–2.5)
ALT: 14 U/L (ref 6–29)
AST: 17 U/L (ref 10–35)
Albumin: 4.5 g/dL (ref 3.6–5.1)
Alkaline phosphatase (APISO): 108 U/L (ref 33–130)
Bilirubin, Direct: 0.1 mg/dL (ref 0.0–0.2)
Globulin: 3 g/dL (calc) (ref 1.9–3.7)
Indirect Bilirubin: 0.6 mg/dL (calc) (ref 0.2–1.2)
Total Bilirubin: 0.7 mg/dL (ref 0.2–1.2)
Total Protein: 7.5 g/dL (ref 6.1–8.1)

## 2017-07-29 LAB — MAGNESIUM: Magnesium: 2.1 mg/dL (ref 1.5–2.5)

## 2017-07-29 LAB — LIPID PANEL
Cholesterol: 197 mg/dL (ref <200)
HDL: 55 mg/dL (ref >50)
LDL Cholesterol (Calc): 125 mg/dL (calc) — ABNORMAL HIGH
Non-HDL Cholesterol (Calc): 142 mg/dL (calc) — ABNORMAL HIGH (ref <130)
Total CHOL/HDL Ratio: 3.6 (calc) (ref <5.0)
Triglycerides: 76 mg/dL (ref <150)

## 2017-07-29 LAB — VITAMIN D 25 HYDROXY (VIT D DEFICIENCY, FRACTURES): Vit D, 25-Hydroxy: 59 ng/mL (ref 30–100)

## 2017-07-29 LAB — INSULIN, RANDOM: Insulin: 3.8 u[IU]/mL (ref 2.0–19.6)

## 2017-08-01 DIAGNOSIS — M71572 Other bursitis, not elsewhere classified, left ankle and foot: Secondary | ICD-10-CM | POA: Diagnosis not present

## 2017-08-01 DIAGNOSIS — M76822 Posterior tibial tendinitis, left leg: Secondary | ICD-10-CM | POA: Diagnosis not present

## 2017-08-01 DIAGNOSIS — M722 Plantar fascial fibromatosis: Secondary | ICD-10-CM | POA: Diagnosis not present

## 2017-08-01 DIAGNOSIS — M7732 Calcaneal spur, left foot: Secondary | ICD-10-CM | POA: Diagnosis not present

## 2017-08-08 DIAGNOSIS — M722 Plantar fascial fibromatosis: Secondary | ICD-10-CM | POA: Diagnosis not present

## 2017-08-08 DIAGNOSIS — M71572 Other bursitis, not elsewhere classified, left ankle and foot: Secondary | ICD-10-CM | POA: Diagnosis not present

## 2017-08-12 ENCOUNTER — Encounter: Payer: BLUE CROSS/BLUE SHIELD | Admitting: *Deleted

## 2017-08-16 ENCOUNTER — Encounter: Payer: Self-pay | Admitting: Cardiology

## 2017-09-24 ENCOUNTER — Telehealth: Payer: Self-pay | Admitting: Cardiology

## 2017-09-24 ENCOUNTER — Encounter: Payer: BLUE CROSS/BLUE SHIELD | Admitting: *Deleted

## 2017-09-24 NOTE — Telephone Encounter (Signed)
Spoke with pt and reminded pt of remote transmission that is due today. Pt verbalized understanding.   

## 2017-09-26 ENCOUNTER — Encounter: Payer: Self-pay | Admitting: Cardiology

## 2017-10-17 NOTE — Progress Notes (Signed)
Assessment and Plan:  Alyssa Dudley was seen today for leg pain.  Diagnoses and all orders for this visit:  Acute pain of left lower extremity -     VAS [place] LOWER EXTREMITY VENOUS (DVT); Future  Wait for [place] results; if negative can start:  -     predniSONE (DELTASONE) 20 MG tablet; 1 tab 3 x day for 3 days, then 1 tab 2 x day for 3 days, then 1 tab 1 x day for 5 days -     gabapentin (NEURONTIN) 100 MG capsule; Take 1 capsule (100 mg total) by mouth 3 (three) times daily.  Paroxysmal atrial fibrillation (HCC) -     VAS [place] LOWER EXTREMITY VENOUS (DVT); Future  Pure hypercholesterolemia Some misunderstandings regarding diet identified; discussed today and information provided Prescribed crestor but has not taken; would like to address lifestyle aggressively  Follow up as scheduled for OV next week  Further disposition pending results of labs. Discussed med's effects and SE's.   Over 15 minutes of exam, counseling, chart review, and critical decision making was performed.   Future Appointments  Date Time Provider [place]  10/21/2017 11:00 AM [facility] VASC 4 [facility]  10/22/2017  9:30 AM Vicie Mutters, PA-C [facility] None  03/04/2018 10:00 AM Vicie Mutters, PA-C [facility] None    ------------------------------------------------------------------------------------------------------------------   HPI BP 136/84   Pulse 74   Temp (!) 97.3 F (36.3 C)   Ht 5' 5.5" (1.664 m)   Wt 180 lb (81.6 kg)   SpO2 97%   BMI 29.50 kg/m   [name] presents for lower extremity general achiness and sensation of muscle fatigue and intermittent "sharp, electrical" superficial pains in her thigh and calf. Denies joint pain, swelling, numbness, weakness. This is ongoing for 3-4 weeks. She is concerned of DVT.   She has hx of hyperlipidemia, paroxysmal a. Fib. That she has awareness of and reports she can "bear down" and feel herself convert out of - typically only happens 2-3 times  annually, lasts just 5-10 seconds. She reports she did have an episode of "fluttering" ~1 week prior to onset of leg pain - lasted longer than usual - a few minutes. She denies injury of any kind to lower extremity. + family hx of DVT/PE in her mother.   She does have chronic intermittent lower back pain that she reports is at baseline - she does back strengthening exercises and takes tylenol for this as needed.   She is prescribed crestor but has not started taking this and has been working on diet for recently elevated cholesterol; identified some misunderstandings regarding appropriate diet for cholesterol improvement - discussed at length, information provided.   Past Medical History:  Diagnosis Date  . Hyperlipidemia   . Paroxysmal atrial fibrillation ([place]) 6/15   detected on ppm interrogation  . PONV (postoperative nausea and vomiting)    has vomitted in past  . Second degree Mobitz II AV block   . Vitamin D deficiency      No Known Allergies  Current Outpatient Medications on File Prior to Visit  Medication Sig  . diphenhydramine-acetaminophen (TYLENOL PM) 25-500 MG TABS tablet Take 1 tablet by mouth at bedtime as needed (pain/sleep).   . triamcinolone cream (KENALOG) 0.1 % Apply 1 application topically 3 (three) times daily as needed (rash).  . Vitamin D, Ergocalciferol, (DRISDOL) 50000 units CAPS capsule TAKE ONE CAPSULE BY MOUTH twice a week  . rosuvastatin (CRESTOR) 40 MG tablet Take 1/2 to 1  tablet daily or as directed for Cholesterol (Patient not taking: Reported on 10/18/2017)   No current facility-administered medications on file prior to visit.     ROS: all negative except above.   Physical Exam:  BP 136/84   Pulse 74   Temp (!) 97.3 F (36.3 C)   Ht 5' 5.5" (1.664 m)   Wt 180 lb (81.6 kg)   SpO2 97%   BMI 29.50 kg/m   General Appearance: Well nourished, in no apparent distress. Neck: Supple.  Respiratory: Respiratory effort normal, BS equal bilaterally  without rales, rhonchi, wheezing or stridor.  Cardio: RRR with no MRGs. Brisk peripheral pulses without edema.  Abdomen: Soft, + BS.  Non tender, no guarding, rebound, hernias, masses. Lymphatics: Non tender without lymphadenopathy.  Musculoskeletal: Full ROM, 5/5 strength, normal gait. Generalized achiness to anterior thigh and calf - nontender to palpation, no point tenderness identified.   Skin: Warm, dry without rashes, lesions, ecchymosis.  Neuro: Normal muscle tone, no cerebellar symptoms. Sensation intact.  Psych: Awake and oriented X 3, normal affect, Insight and Judgment appropriate.     Izora Ribas, NP 1:02 PM [facility]

## 2017-10-18 ENCOUNTER — Encounter: Payer: Self-pay | Admitting: Adult Health

## 2017-10-18 ENCOUNTER — Ambulatory Visit (INDEPENDENT_AMBULATORY_CARE_PROVIDER_SITE_OTHER): Payer: BLUE CROSS/BLUE SHIELD | Admitting: Adult Health

## 2017-10-18 VITALS — BP 136/84 | HR 74 | Temp 97.3°F | Ht 65.5 in | Wt 180.0 lb

## 2017-10-18 DIAGNOSIS — E7800 Pure hypercholesterolemia, unspecified: Secondary | ICD-10-CM

## 2017-10-18 DIAGNOSIS — I480 Paroxysmal atrial fibrillation: Secondary | ICD-10-CM | POA: Diagnosis not present

## 2017-10-18 DIAGNOSIS — M79605 Pain in left leg: Secondary | ICD-10-CM | POA: Diagnosis not present

## 2017-10-18 MED ORDER — GABAPENTIN 100 MG PO CAPS: 100.0000 mg | ORAL_CAPSULE | Freq: Three times a day (TID) | ORAL | 2 refills | Status: DC

## 2017-10-18 MED ORDER — PREDNISONE 20 MG PO TABS: ORAL_TABLET | ORAL | 0 refills | Status: DC

## 2017-10-18 NOTE — Patient Instructions (Addendum)
Fat and Cholesterol Restricted Diet Getting too much fat and cholesterol in your diet may cause health problems. Following this diet helps keep your fat and cholesterol at normal levels. This can keep you from getting sick. What types of fat should I choose?  Choose monosaturated and polyunsaturated fats. These are found in foods such as olive oil, canola oil, flaxseeds, walnuts, almonds, and seeds.  Eat more omega-3 fats. Good choices include salmon, mackerel, sardines, tuna, flaxseed oil, and ground flaxseeds.  Limit saturated fats. These are in animal products such as meats, butter, and cream. They can also be in plant products such as palm oil, palm kernel oil, and coconut oil.  Avoid foods with partially hydrogenated oils in them. These contain trans fats. Examples of foods that have trans fats are stick margarine, some tub margarines, cookies, crackers, and other baked goods. What general guidelines do I need to follow?  Check food labels. Look for the words "trans fat" and "saturated fat."  When preparing a meal: ? Fill half of your plate with vegetables and green salads. ? Fill one fourth of your plate with whole grains. Look for the word "whole" as the first word in the ingredient list. ? Fill one fourth of your plate with lean protein foods.  Eat more foods that have fiber, like apples, carrots, beans, peas, and barley.  Eat more home-cooked foods. Eat less at restaurants and buffets.  Limit or avoid alcohol.  Limit foods high in starch and sugar.  Limit fried foods.  Cook foods without frying them. Baking, boiling, grilling, and broiling are all great options.  Lose weight if you are overweight. Losing even a small amount of weight can help your overall health. It can also help prevent diseases such as diabetes and heart disease. What foods can I eat? Grains Whole grains, such as whole wheat or whole grain breads, crackers, cereals, and pasta. Unsweetened oatmeal,  bulgur, barley, quinoa, or brown rice. Corn or whole wheat flour tortillas. Vegetables Fresh or frozen vegetables (raw, steamed, roasted, or grilled). Green salads. Fruits All fresh, canned (in natural juice), or frozen fruits. Meat and Other Protein Products Ground beef (85% or leaner), grass-fed beef, or beef trimmed of fat. Skinless chicken or turkey. Ground chicken or turkey. Pork trimmed of fat. All fish and seafood. Eggs. Dried beans, peas, or lentils. Unsalted nuts or seeds. Unsalted canned or dry beans. Dairy Low-fat dairy products, such as skim or 1% milk, 2% or reduced-fat cheeses, low-fat ricotta or cottage cheese, or plain low-fat yogurt. Fats and Oils Tub margarines without trans fats. Light or reduced-fat mayonnaise and salad dressings. Avocado. Olive, canola, sesame, or safflower oils. Natural peanut or almond butter (choose ones without added sugar and oil). The items listed above may not be a complete list of recommended foods or beverages. Contact your dietitian for more options. What foods are not recommended? Grains White bread. White pasta. White rice. Cornbread. Bagels, pastries, and croissants. Crackers that contain trans fat. Vegetables White potatoes. Corn. Creamed or fried vegetables. Vegetables in a cheese sauce. Fruits Dried fruits. Canned fruit in light or heavy syrup. Fruit juice. Meat and Other Protein Products Fatty cuts of meat. Ribs, chicken wings, bacon, sausage, bologna, salami, chitterlings, fatback, hot dogs, bratwurst, and packaged luncheon meats. Liver and organ meats. Dairy Whole or 2% milk, cream, half-and-half, and cream cheese. Whole milk cheeses. Whole-fat or sweetened yogurt. Full-fat cheeses. Nondairy creamers and whipped toppings. Processed cheese, cheese spreads, or cheese curds. Sweets and Desserts Corn   syrup, sugars, honey, and molasses. Candy. Jam and jelly. Syrup. Sweetened cereals. Cookies, pies, cakes, donuts, muffins, and ice  cream. Fats and Oils Butter, stick margarine, lard, shortening, ghee, or bacon fat. Coconut, palm kernel, or palm oils. Beverages Alcohol. Sweetened drinks (such as sodas, lemonade, and fruit drinks or punches). The items listed above may not be a complete list of foods and beverages to avoid. Contact your dietitian for more information. This information is not intended to replace advice given to you by your health care provider. Make sure you discuss any questions you have with your health care provider. Document Released: 03/25/2012 Document Revised: 05/31/2016 Document Reviewed: 12/24/2013 Elsevier Interactive Patient Education  2018 [place].     Back Exercises The following exercises strengthen the muscles that help to support the back. They also help to keep the lower back flexible. Doing these exercises can help to prevent back pain or lessen existing pain. If you have back pain or discomfort, try doing these exercises 2-3 times each day or as told by your health care provider. When the pain goes away, do them once each day, but increase the number of times that you repeat the steps for each exercise (do more repetitions). If you do not have back pain or discomfort, do these exercises once each day or as told by your health care provider. Exercises Single Knee to Chest  Repeat these steps 3-5 times for each leg: 1. Lie on your back on a firm bed or the floor with your legs extended. 2. Bring one knee to your chest. Your other leg should stay extended and in contact with the floor. 3. Hold your knee in place by grabbing your knee or thigh. 4. Pull on your knee until you feel a gentle stretch in your lower back. 5. Hold the stretch for 10-30 seconds. 6. Slowly release and straighten your leg.  Pelvic Tilt  Repeat these steps 5-10 times: 1. Lie on your back on a firm bed or the floor with your legs extended. 2. Bend your knees so they are pointing toward the ceiling and your  feet are flat on the floor. 3. Tighten your lower abdominal muscles to press your lower back against the floor. This motion will tilt your pelvis so your tailbone points up toward the ceiling instead of pointing to your feet or the floor. 4. With gentle tension and even breathing, hold this position for 5-10 seconds.  Cat-Cow  Repeat these steps until your lower back becomes more flexible: 1. Get into a hands-and-knees position on a firm surface. Keep your hands under your shoulders, and keep your knees under your hips. You may place padding under your knees for comfort. 2. Let your head hang down, and point your tailbone toward the floor so your lower back becomes rounded like the back of a cat. 3. Hold this position for 5 seconds. 4. Slowly lift your head and point your tailbone up toward the ceiling so your back forms a sagging arch like the back of a cow. 5. Hold this position for 5 seconds.  Press-Ups  Repeat these steps 5-10 times: 1. Lie on your abdomen (face-down) on the floor. 2. Place your palms near your head, about shoulder-width apart. 3. While you keep your back as relaxed as possible and keep your hips on the floor, slowly straighten your arms to raise the top half of your body and lift your shoulders. Do not use your back muscles to raise your upper torso. You  may adjust the placement of your hands to make yourself more comfortable. 4. Hold this position for 5 seconds while you keep your back relaxed. 5. Slowly return to lying flat on the floor.  Bridges  Repeat these steps 10 times: 1. Lie on your back on a firm surface. 2. Bend your knees so they are pointing toward the ceiling and your feet are flat on the floor. 3. Tighten your buttocks muscles and lift your buttocks off of the floor until your waist is at almost the same height as your knees. You should feel the muscles working in your buttocks and the back of your thighs. If you do not feel these muscles, slide your  feet 1-2 inches farther away from your buttocks. 4. Hold this position for 3-5 seconds. 5. Slowly lower your hips to the starting position, and allow your buttocks muscles to relax completely.  If this exercise is too easy, try doing it with your arms crossed over your chest. Abdominal Crunches  Repeat these steps 5-10 times: 1. Lie on your back on a firm bed or the floor with your legs extended. 2. Bend your knees so they are pointing toward the ceiling and your feet are flat on the floor. 3. Cross your arms over your chest. 4. Tip your chin slightly toward your chest without bending your neck. 5. Tighten your abdominal muscles and slowly raise your trunk (torso) high enough to lift your shoulder blades a tiny bit off of the floor. Avoid raising your torso higher than that, because it can put too much stress on your low back and it does not help to strengthen your abdominal muscles. 6. Slowly return to your starting position.  Back Lifts Repeat these steps 5-10 times: 1. Lie on your abdomen (face-down) with your arms at your sides, and rest your forehead on the floor. 2. Tighten the muscles in your legs and your buttocks. 3. Slowly lift your chest off of the floor while you keep your hips pressed to the floor. Keep the back of your head in line with the curve in your back. Your eyes should be looking at the floor. 4. Hold this position for 3-5 seconds. 5. Slowly return to your starting position.  Contact a health care provider if:  Your back pain or discomfort gets much worse when you do an exercise.  Your back pain or discomfort does not lessen within 2 hours after you exercise. If you have any of these problems, stop doing these exercises right away. Do not do them again unless your health care provider says that you can. Get help right away if:  You develop sudden, severe back pain. If this happens, stop doing the exercises right away. Do not do them again unless your health care  provider says that you can. This information is not intended to replace advice given to you by your health care provider. Make sure you discuss any questions you have with your health care provider. Document Released: 11/01/2004 Document Revised: 02/01/2016 Document Reviewed: 11/18/2014 Elsevier Interactive Patient Education  2017 [employer].

## 2017-10-21 ENCOUNTER — Ambulatory Visit (HOSPITAL_COMMUNITY)
Admission: RE | Admit: 2017-10-21 | Discharge: 2017-10-21 | Disposition: A | Discharge status: Home / Self Care | Payer: BLUE CROSS/BLUE SHIELD | Source: Ambulatory Visit | Attending: Surgery | Admitting: Surgery

## 2017-10-21 DIAGNOSIS — I480 Paroxysmal atrial fibrillation: Secondary | ICD-10-CM | POA: Insufficient documentation

## 2017-10-21 DIAGNOSIS — M79605 Pain in left leg: Secondary | ICD-10-CM

## 2017-10-22 ENCOUNTER — Ambulatory Visit: Payer: BLUE CROSS/BLUE SHIELD | Admitting: Physician Assistant

## 2017-10-22 ENCOUNTER — Encounter: Payer: Self-pay | Admitting: Physician Assistant

## 2017-10-22 VITALS — BP 138/88 | HR 77 | Temp 97.6°F | Resp 16 | Ht 65.5 in | Wt 178.8 lb

## 2017-10-22 DIAGNOSIS — I10 Essential (primary) hypertension: Secondary | ICD-10-CM

## 2017-10-22 DIAGNOSIS — M79605 Pain in left leg: Secondary | ICD-10-CM | POA: Diagnosis not present

## 2017-10-22 DIAGNOSIS — E7800 Pure hypercholesterolemia, unspecified: Secondary | ICD-10-CM | POA: Diagnosis not present

## 2017-10-22 DIAGNOSIS — I480 Paroxysmal atrial fibrillation: Secondary | ICD-10-CM

## 2017-10-22 LAB — CBC WITH DIFFERENTIAL/PLATELET
Basophils Absolute: 20 cells/uL (ref 0–200)
Basophils Relative: 0.5 %
Eosinophils Absolute: 101 cells/uL (ref 15–500)
Eosinophils Relative: 2.6 %
HCT: 40.3 % (ref 35.0–45.0)
Hemoglobin: 13.9 g/dL (ref 11.7–15.5)
Lymphs Abs: 1689 cells/uL (ref 850–3900)
MCH: 30.8 pg (ref 27.0–33.0)
MCHC: 34.5 g/dL (ref 32.0–36.0)
MCV: 89.2 fL (ref 80.0–100.0)
MPV: 10.3 fL (ref 7.5–12.5)
Monocytes Relative: 16.6 %
Neutro Abs: 1443 cells/uL — ABNORMAL LOW (ref 1500–7800)
Neutrophils Relative %: 37 %
Platelets: 215 10*3/uL (ref 140–400)
RBC: 4.52 10*6/uL (ref 3.80–5.10)
RDW: 12.8 % (ref 11.0–15.0)
Total Lymphocyte: 43.3 %
WBC mixed population: 647 cells/uL (ref 200–950)
WBC: 3.9 10*3/uL (ref 3.8–10.8)

## 2017-10-22 LAB — BASIC METABOLIC PANEL WITH GFR
BUN: 10 mg/dL (ref 7–25)
CO2: 30 mmol/L (ref 20–32)
Calcium: 9.5 mg/dL (ref 8.6–10.4)
Chloride: 107 mmol/L (ref 98–110)
Creat: 0.71 mg/dL (ref 0.50–0.99)
GFR, Est African American: 107 mL/min/{1.73_m2} (ref >=60)
GFR, Est Non African American: 93 mL/min/{1.73_m2} (ref >=60)
Glucose, Bld: 83 mg/dL (ref 65–99)
Potassium: 4.4 mmol/L (ref 3.5–5.3)
Sodium: 142 mmol/L (ref 135–146)

## 2017-10-22 LAB — LIPID PANEL
Cholesterol: 162 mg/dL (ref <200)
HDL: 61 mg/dL (ref >50)
LDL Cholesterol (Calc): 86 mg/dL (calc)
Non-HDL Cholesterol (Calc): 101 mg/dL (calc) (ref <130)
Total CHOL/HDL Ratio: 2.7 (calc) (ref <5.0)
Triglycerides: 60 mg/dL (ref <150)

## 2017-10-22 LAB — HEPATIC FUNCTION PANEL
AG Ratio: 1.5 (calc) (ref 1.0–2.5)
ALT: 16 U/L (ref 6–29)
AST: 16 U/L (ref 10–35)
Albumin: 4.3 g/dL (ref 3.6–5.1)
Alkaline phosphatase (APISO): 98 U/L (ref 33–130)
Bilirubin, Direct: 0.1 mg/dL (ref 0.0–0.2)
Globulin: 2.9 g/dL (calc) (ref 1.9–3.7)
Indirect Bilirubin: 0.3 mg/dL (calc) (ref 0.2–1.2)
Total Bilirubin: 0.4 mg/dL (ref 0.2–1.2)
Total Protein: 7.2 g/dL (ref 6.1–8.1)

## 2017-10-22 LAB — TSH: TSH: 3.11 mIU/L (ref 0.40–4.50)

## 2017-10-22 NOTE — Progress Notes (Signed)
FOLLOW UP  Assessment and Plan: Essential hypertension - continue medications, DASH diet, exercise and monitor at home. Call if greater than 130/80. - CBC with Differential/Platelet - BASIC METABOLIC PANEL WITH GFR - TSH - Hepatic function panel   Hyperlipidemia -check lipids, decrease fatty foods, increase activity.  ADD on fiber, no meds at this time, wants to try diet - Lipid panel  Obesity Obesity with co morbidities- long discussion about weight loss, diet, and exercise  Mobitz type 2 second degree and high-grade heart block Has pacemaker   Paroxysmal atrial fibrillation Not on coagulation, following with Cardio   Discussed med's effects and SE's. Screening labs and tests as requested with regular follow-up as recommended. Future Appointments  Date Time Provider [place]  03/04/2018 10:00 AM Vicie Mutters, PA-C [facility] None     HPI  62 y.o. female  presents for 6 month follow up for HTN, chol, preDM, vitamin D def.   Has been having left lower leg pain, had negative [place], intermittent, posterior calf, but now anterior left thigh, has left lower back pain and numbness is worse with bending.  She did not start the prednisone and gabapentin.   Her blood pressure has been controlled at home, today their BP is BP: 138/88 She does workout, walking and running, doing 5K this Saturday. She denies chest pain, shortness of breath, dizziness.  She has Afib and mobitz type 2, follows with Dr. [name] and Leonides Schanz, low risk CHADSVASC 1 and not on anticoagulation.  She is not on cholesterol medication, was suppose to be on crestor but she is not one it.  Her cholesterol is not at goal. The cholesterol last visit was:   Lab Results  Component Value Date   CHOL 197 07/26/2017   HDL 55 07/26/2017   LDLCALC 127 (H) 02/27/2017   TRIG 76 07/26/2017   CHOLHDL 3.6 07/26/2017    She has been working on diet and exercise for prediabetes and has done a good job of decreasing her  A1C, and denies paresthesia of the feet, polydipsia, polyuria and visual disturbances. Last A1C in the office was:  Lab Results  Component Value Date   HGBA1C 5.3 07/26/2017  Patient is on Vitamin D supplement, 50,000 IU only once a week.   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 Graduated from [employer] in May. She states she has not been sleeping well with melatonin and walking.    BMI is Body mass index is 29.3 kg/m., she is working on diet and exercise. Wt Readings from Last 3 Encounters:  10/22/17 178 lb 12.8 oz (81.1 kg)  10/18/17 180 lb (81.6 kg)  07/26/17 179 lb 3.2 oz (81.3 kg)     Current Medications:  Current Outpatient Medications on File Prior to Visit  Medication Sig Dispense Refill  . diphenhydramine-acetaminophen (TYLENOL PM) 25-500 MG TABS tablet Take 1 tablet by mouth at bedtime as needed (pain/sleep).     . gabapentin (NEURONTIN) 100 MG capsule Take 1 capsule (100 mg total) by mouth 3 (three) times daily. 90 capsule 2  . rosuvastatin (CRESTOR) 40 MG tablet Take 1/2 to 1 tablet daily or as directed for Cholesterol 30 tablet 5  . triamcinolone cream (KENALOG) 0.1 % Apply 1 application topically 3 (three) times daily as needed (rash).    . Vitamin D, Ergocalciferol, (DRISDOL) 50000 units CAPS capsule TAKE ONE  CAPSULE BY MOUTH twice a week 60 capsule 3   No current facility-administered medications on file prior to visit.    Patient Active Problem List   Diagnosis Date Noted  . Obesity 02/25/2015  . HTN (hypertension) 08/17/2014  . Abnormal glucose 08/17/2014  . Medication management 08/17/2014  . Atrial fibrillation ([place]) 06/07/2014  . Hyperlipidemia   . Vitamin D deficiency   . Mobitz type 2 second degree and high-grade heart block 01/07/2013  . Family history of malignant neoplasm of gastrointestinal tract 10/15/2011  . Benign neoplasm of colon  10/15/2011  . GERD 01/10/2008  . Constipation 01/10/2008  . History of peptic ulcer disease 01/10/2008     Allergies: No Known Allergies   Review of Systems: Review of Systems  Constitutional: Negative.   HENT: Negative.   Eyes: Negative.   Respiratory: Negative.   Cardiovascular: Negative.   Gastrointestinal: Negative.   Genitourinary: Negative.   Musculoskeletal: Negative.   Skin: Negative.   Neurological: Negative.   Endo/Heme/Allergies: Negative.   Psychiatric/Behavioral: Negative for depression, hallucinations, memory loss, substance abuse and suicidal ideas. The patient is not nervous/anxious and does not have insomnia.    Physical Exam: Estimated body mass index is 29.3 kg/m as calculated from the following:   Height as of this encounter: 5' 5.5" (1.664 m).   Weight as of this encounter: 178 lb 12.8 oz (81.1 kg). BP 138/88   Pulse 77   Temp 97.6 F (36.4 C)   Resp 16   Ht 5' 5.5" (1.664 m)   Wt 178 lb 12.8 oz (81.1 kg)   SpO2 99%   BMI 29.30 kg/m  General Appearance: Well nourished, in no apparent distress.  Eyes: PERRLA, EOMs, conjunctiva no swelling or erythema, normal fundi and vessels.  Sinuses: No Frontal/maxillary tenderness  ENT/Mouth: Ext aud canals clear, normal light reflex with TMs without erythema, bulging. Good dentition. No erythema, swelling, or exudate on post pharynx. Tonsils not swollen or erythematous. Hearing normal.  Neck: Supple, thyroid normal. No bruits  Respiratory: Respiratory effort normal, BS equal bilaterally without rales, rhonchi, wheezing or stridor.  Cardio: RRR without murmurs, rubs or gallops. Brisk peripheral pulses without edema.  Chest: symmetric, with normal excursions and percussion. .  Abdomen: Soft, nontender, no guarding, rebound, hernias, masses, or organomegaly.  Lymphatics: Non tender without lymphadenopathy.  Musculoskeletal: Full ROM all peripheral extremities,5/5 strength, and normal gait, negative straight leg.  .  Skin: Warm, dry without rashes, lesions, ecchymosis. Neuro: Cranial nerves intact, reflexes equal bilaterally. Normal muscle tone, no cerebellar symptoms. Sensation intact.  Psych: Awake and oriented X 3, normal affect, Insight and Judgment appropriate.    Vicie Mutters 9:48 AM [facility]

## 2017-10-22 NOTE — Patient Instructions (Addendum)
Benefiber or Citracel is good for constipation/diarrhea/irritable bowel syndrome, it helps with weight loss and can help lower your bad cholesterol. Please do 1 TBSP in the morning in water, coffee, or tea. It can take up to a month before you can see a difference with your bowel movements. It is cheapest from [employer], [employer], [employer].    Try the exercises and other information in the back care manual, take the prednisone complete it, take the gabapentin as needed.   Go to the ER if you have any new weakness in your legs, have trouble controlling your urine or bowels, or have worsening pain.   If you are not better in 1-3 month we will refer you to ortho   Back pain Rehab Ask your health care provider which exercises are safe for you. Do exercises exactly as told by your health care provider and adjust them as directed. It is normal to feel mild stretching, pulling, tightness, or discomfort as you do these exercises, but you should stop right away if you feel sudden pain or your pain gets worse.Do not begin these exercises until told by your health care provider. Stretching and range of motion exercises These exercises warm up your muscles and joints and improve the movement and flexibility of your hips and your back. These exercises also help to relieve pain, numbness, and tingling. Exercise A: Sciatic nerve glide 1. Sit in a chair with your head facing down toward your chest. Place your hands behind your back. Let your shoulders slump forward. 2. Slowly straighten one of your knees while you tilt your head back as if you are looking toward the ceiling. Only straighten your leg as far as you can without making your symptoms worse. 3. Hold for __________ seconds. 4. Slowly return to the starting position. 5. Repeat with your other leg. Repeat __________ times. Complete this exercise __________ times a day. Exercise B: Knee to chest with hip adduction and internal rotation  1. Lie on your back  on a firm surface with both legs straight. 2. Bend one of your knees and move it up toward your chest until you feel a gentle stretch in your lower back and buttock. Then, move your knee toward the shoulder that is on the opposite side from your leg. ? Hold your leg in this position by holding onto the front of your knee. 3. Hold for __________ seconds. 4. Slowly return to the starting position. 5. Repeat with your other leg. Repeat __________ times. Complete this exercise __________ times a day. Exercise C: Prone extension on elbows  1. Lie on your abdomen on a firm surface. A bed may be too soft for this exercise. 2. Prop yourself up on your elbows. 3. Use your arms to help lift your chest up until you feel a gentle stretch in your abdomen and your lower back. ? This will place some of your body weight on your elbows. If this is uncomfortable, try stacking pillows under your chest. ? Your hips should stay down, against the surface that you are lying on. Keep your hip and back muscles relaxed. 4. Hold for __________ seconds. 5. Slowly relax your upper body and return to the starting position. Repeat __________ times. Complete this exercise __________ times a day. Strengthening exercises These exercises build strength and endurance in your back. Endurance is the ability to use your muscles for a long time, even after they get tired. Exercise D: Pelvic tilt 1. Lie on your back on a firm  surface. Bend your knees and keep your feet flat. 2. Tense your abdominal muscles. Tip your pelvis up toward the ceiling and flatten your lower back into the floor. ? To help with this exercise, you may place a small towel under your lower back and try to push your back into the towel. 3. Hold for __________ seconds. 4. Let your muscles relax completely before you repeat this exercise. Repeat __________ times. Complete this exercise __________ times a day. Exercise E: Alternating arm and leg raises  1. Get  on your hands and knees on a firm surface. If you are on a hard floor, you may want to use padding to cushion your knees, such as an exercise mat. 2. Line up your arms and legs. Your hands should be below your shoulders, and your knees should be below your hips. 3. Lift your left leg behind you. At the same time, raise your right arm and straighten it in front of you. ? Do not lift your leg higher than your hip. ? Do not lift your arm higher than your shoulder. ? Keep your abdominal and back muscles tight. ? Keep your hips facing the ground. ? Do not arch your back. ? Keep your balance carefully, and do not hold your breath. 4. Hold for __________ seconds. 5. Slowly return to the starting position and repeat with your right leg and your left arm. Repeat __________ times. Complete this exercise __________ times a day. Posture and body mechanics  Body mechanics refers to the movements and positions of your body while you do your daily activities. Posture is part of body mechanics. Good posture and healthy body mechanics can help to relieve stress in your body's tissues and joints. Good posture means that your spine is in its natural S-curve position (your spine is neutral), your shoulders are pulled back slightly, and your head is not tipped forward. The following are general guidelines for applying improved posture and body mechanics to your everyday activities. Standing   When standing, keep your spine neutral and your feet about hip-width apart. Keep a slight bend in your knees. Your ears, shoulders, and hips should line up.  When you do a task in which you stand in one place for a long time, place one foot up on a stable object that is 2-4 inches (5-10 cm) high, such as a footstool. This helps keep your spine neutral. Sitting   When sitting, keep your spine neutral and keep your feet flat on the floor. Use a footrest, if necessary, and keep your thighs parallel to the floor. Avoid rounding  your shoulders, and avoid tilting your head forward.  When working at a desk or a computer, keep your desk at a height where your hands are slightly lower than your elbows. Slide your chair under your desk so you are close enough to maintain good posture.  When working at a computer, place your monitor at a height where you are looking straight ahead and you do not have to tilt your head forward or downward to look at the screen. Resting   When lying down and resting, avoid positions that are most painful for you.  If you have pain with activities such as sitting, bending, stooping, or squatting (flexion-based activities), lie in a position in which your body does not bend very much. For example, avoid curling up on your side with your arms and knees near your chest (fetal position).  If you have pain with activities such as standing  for a long time or reaching with your arms (extension-based activities), lie with your spine in a neutral position and bend your knees slightly. Try the following positions: ? Lying on your side with a pillow between your knees. ? Lying on your back with a pillow under your knees. Lifting   When lifting objects, keep your feet at least shoulder-width apart and tighten your abdominal muscles.  Bend your knees and hips and keep your spine neutral. It is important to lift using the strength of your legs, not your back. Do not lock your knees straight out.  Always ask for help to lift heavy or awkward objects. This information is not intended to replace advice given to you by your health care provider. Make sure you discuss any questions you have with your health care provider. Document Released: 09/24/2005 Document Revised: 05/31/2016 Document Reviewed: 06/10/2015 Elsevier Interactive Patient Education  [employer].

## 2017-11-01 ENCOUNTER — Other Ambulatory Visit: Payer: Self-pay | Admitting: Physician Assistant

## 2018-01-07 ENCOUNTER — Ambulatory Visit: Payer: BLUE CROSS/BLUE SHIELD | Admitting: Adult Health

## 2018-01-07 ENCOUNTER — Encounter: Payer: Self-pay | Admitting: Adult Health

## 2018-01-07 VITALS — BP 156/88 | HR 77 | Temp 97.5°F | Ht 65.5 in | Wt 182.0 lb

## 2018-01-07 DIAGNOSIS — I10 Essential (primary) hypertension: Secondary | ICD-10-CM

## 2018-01-07 DIAGNOSIS — F5102 Adjustment insomnia: Secondary | ICD-10-CM | POA: Diagnosis not present

## 2018-01-07 MED ORDER — ATENOLOL 25 MG PO TABS: 25.0000 mg | ORAL_TABLET | Freq: Every day | ORAL | 11 refills | Status: DC

## 2018-01-07 MED ORDER — TRAZODONE HCL 50 MG PO TABS: ORAL_TABLET | ORAL | 2 refills | Status: DC

## 2018-01-07 NOTE — Patient Instructions (Signed)
Start with 1/2 tab of blood pressure medication    Monitor your blood pressure at home, please keep a record and bring that in with you to your next office visit.   Go to the ER if any CP, SOB, nausea, dizziness, severe HA, changes vision/speech  Due to a recent study, SPRINT, we have changed our goal for the systolic or top blood pressure number. Ideally we want your top number at 120.  In the [facility] Trial, 5000 people were randomized to a goal BP of 120 and 5000 people were randomized to a goal BP of less than 140. The patients with the goal BP at 120 had LESS DEMENTIA, LESS HEART ATTACKS, AND LESS STROKES, AS WELL AS OVERALL DECREASED MORTALITY OR DEATH RATE.   If you are willing, our goal BP is the top number of 120.  Your most recent BP: BP: (!) 156/88   Take your medications faithfully as instructed. Maintain a healthy weight. Get at least 150 minutes of aerobic exercise per week. Minimize salt intake. Minimize alcohol intake    11 Tips to Follow:  1. No caffeine after 3pm: Avoid beverages with caffeine (soda, tea, energy drinks, etc.) especially after 3pm. 2. Don't go to bed hungry: Have your evening meal at least 3 hrs. before going to sleep. It's fine to have a small bedtime snack such as a glass of milk and a few crackers but don't have a big meal. 3. Have a nightly routine before bed: Plan on "winding down" before you go to sleep. Begin relaxing about 1 hour before you go to bed. Try doing a quiet activity such as listening to calming music, reading a book or meditating. 4. Turn off the TV and ALL electronics including video games, tablets, laptops, etc. 1 hour before sleep, and keep them out of the bedroom. 5. Turn off your cell phone and all notifications (new email and text alerts) or even better, leave your phone outside your room while you sleep. Studies have shown that a part of your brain continues to respond to certain lights and sounds even while you're still  asleep. 6. Make your bedroom quiet, dark and cool. If you can't control the noise, try wearing earplugs or using a fan to block out other sounds. 7. Practice relaxation techniques. Try reading a book or meditating or drain your brain by writing a list of what you need to do the next day. 8. Don't nap unless you feel sick: you'll have a better night's sleep. 9. Don't smoke, or quit if you do. Nicotine, alcohol, and marijuana can all keep you awake. Talk to your health care provider if you need help with substance use. 10. Most importantly, wake up at the same time every day (or within 1 hour of your usual wake up time) EVEN on the weekends. A regular wake up time promotes sleep hygiene and prevents sleep problems. 11. Reduce exposure to bright light in the last three hours of the day before going to sleep. Maintaining good sleep hygiene and having good sleep habits lower your risk of developing sleep problems. Getting better sleep can also improve your concentration and alertness. Try the simple steps in this guide. If you still have trouble getting enough rest, make an appointment with your health care provider.

## 2018-01-07 NOTE — Progress Notes (Signed)
Assessment and Plan:  Tawn was seen today for blood pressure check.  Diagnoses and all orders for this visit:  Essential hypertension Start low dose atenolol which she tolerated well last time; may take 1/2 tab or full tab to maintain BP below 130/80; call if consistently over; instructed to taper down prior to stopping once BPs resolving after this stressful period Continue DASH diet.   Reminder to go to the ER if any CP, SOB, nausea, dizziness, severe HA, changes vision/speech, left arm numbness and tingling and jaw pain. -     atenolol (TENORMIN) 25 MG tablet; Take 1 tablet (25 mg total) by mouth daily.  Insomnia due to stress Related to acute stress of losing multiple family members- good sleep hygiene discussed, continue benadryl/tylenol PM, try adding melatonin, may add low dose trazodone as needed for sleep/rest and good recovery.  She decline santi-depressant today after discussion of multiple options. Will follow up in 8 weeks as scheduled or sooner as needed.    -   traZODone (DESYREL) 50 MG tablet; 1/2-1 tablet for sleep  Further disposition pending results of labs. Discussed med's effects and SE's.   Over 30 minutes of exam, counseling, chart review, and critical decision making was performed.   Future Appointments  Date Time Provider [place]  03/04/2018 10:00 AM Liane Comber, NP [facility] None    ------------------------------------------------------------------------------------------------------------------   HPI BP (!) 156/88   Pulse 77   Temp (!) 97.5 F (36.4 C)   Ht 5' 5.5" (1.664 m)   Wt 182 lb (82.6 kg)   SpO2 98%   BMI 29.83 kg/m   [name] presents for concerns of elevated blood pressure and difficulty sleeping; she reports he mother and brother both passed away last week. She was primary caregiver for both, and has been handling the majority of funeral and other arrangements and has not been sleeping well at night. She reports  experiencing similar 3 years ago when her son passed away. She was prescribed 25 mg of atenolol as well as ambien for sleep at that time and reports symptoms quickly resolved withint 2-3 months and discontinued medication. She is currently not on medication for BP as typically very well controlled (120-130s/70s, etc). She has been taking tylenol PM to help with mild headache and sleep. She denies any vision changes, dizziness, sensory changes, weakness, chest pain, palpitations (other than usual a. Fib which she reports she can convert out of by valsalva).   Overall she appears to be doing remarkably well considering her recent loss; she declines anti-depressants today after discussion, and feels that she would do well with a very short-term intervention for 2-3 months to help her recover. She would prefer to avoid ambien or benzos.   She has hx of mobitz type 2 second degree heart block s/p pacemaker, a. Fib -   Past Medical History:  Diagnosis Date  . Hyperlipidemia   . Paroxysmal atrial fibrillation ([place]) 6/15   detected on ppm interrogation  . PONV (postoperative nausea and vomiting)    has vomitted in past  . Second degree Mobitz II AV block   . Vitamin D deficiency      No Known Allergies  Current Outpatient Medications on File Prior to Visit  Medication Sig  . diphenhydramine-acetaminophen (TYLENOL PM) 25-500 MG TABS tablet Take 1 tablet by mouth at bedtime as needed (pain/sleep).   . triamcinolone cream (KENALOG) 0.1 % Apply 1 application topically 3 (three) times daily as needed (rash).  Marland Kitchen  Vitamin D, Ergocalciferol, (DRISDOL) 50000 units CAPS capsule TAKE ONE CAPSULE BY MOUTH TWICE A WEEK  . gabapentin (NEURONTIN) 100 MG capsule Take 1 capsule (100 mg total) by mouth 3 (three) times daily. (Patient not taking: Reported on 01/07/2018)   No current facility-administered medications on file prior to visit.     ROS: Review of Systems  Constitutional: Negative for malaise/fatigue and  weight loss.  HENT: Negative for hearing loss and tinnitus.   Eyes: Negative for blurred vision and double vision.  Respiratory: Negative for cough, shortness of breath and wheezing.   Cardiovascular: Negative for chest pain, palpitations, orthopnea, claudication and leg swelling.  Gastrointestinal: Negative for abdominal pain, blood in stool, constipation, diarrhea, heartburn, melena, nausea and vomiting.  Genitourinary: Negative.   Musculoskeletal: Negative for joint pain and myalgias.  Skin: Negative for rash.  Neurological: Positive for headaches (Mild, resolves with tylenol PM). Negative for dizziness, tingling, sensory change and weakness.  Endo/Heme/Allergies: Negative for polydipsia.  Psychiatric/Behavioral: Negative for depression ("I don't have time to be depressed"), substance abuse and suicidal ideas. The patient has insomnia. The patient is not nervous/anxious.   All other systems reviewed and are negative.    Physical Exam:  Ht 5' 5.5" (1.664 m)   Wt 182 lb (82.6 kg)   BMI 29.83 kg/m   General Appearance: Well nourished, in no apparent distress. Eyes: PERRLA, EOMs, conjunctiva no swelling or erythema ENT/Mouth: Ext aud canals clear, TMs without erythema, bulging. No erythema, swelling, or exudate on post pharynx.  Tonsils not swollen or erythematous. Hearing normal.  Neck: Supple, thyroid normal.  Respiratory: Respiratory effort normal, BS equal bilaterally without rales, rhonchi, wheezing or stridor.  Cardio: RRR with no MRGs. Brisk peripheral pulses without edema.  Abdomen: Soft, + BS.  Non tender, no guarding, rebound, hernias, masses. Lymphatics: Non tender without lymphadenopathy.  Musculoskeletal: symmetrical strength, normal gait.  Skin: Warm, dry, no obvious rashes or lesions Neuro: Cranial nerves intact. Normal muscle tone, no cerebellar symptoms.  Psych: Awake and oriented X 3, normal affect, Insight and Judgment appropriate.     Izora Ribas,  NP 11:23 AM [facility]

## 2018-02-12 NOTE — Progress Notes (Signed)
Assessment and Plan:  Alyssa Dudley was seen today for foot pain, leg problem and elbow injury.  Diagnoses and all orders for this visit:  Plantar fasciitis of left foot She will follow up with podiatry for repeat steroid injection to heel next week prior to trip to [place] with stretches, boot at night, can tray using frozen bottle to stretch arch If continues to be problematic can refer to ortho do discuss surgical options  Lateral epicondylitis of left elbow Very mild, suggested she get a tennis elbow band to wear for a while, will provide short prednisone taper today. Can also ice and use NSAID x 2 weeks. Avoid aggravating movements, lifting for a while. Can follow up for local steroid injection if not improving in 12 weeks.   Meralgia paraesthetica, left Mild, intermittent symptoms She has been prescribed gabapentin for this but has not been taking; advised to try 100-300 mg at night when particularly bothersome Weight loss and avoiding tight garments also advised She is also c/o muscle spasm type symptoms - will provider flexeril to take at night for comfort  Other orders -     cyclobenzaprine (FLEXERIL) 5 MG tablet; Take 1 tablet (5 mg total) by mouth 3 (three) times daily as needed for muscle spasms. -     predniSONE (DELTASONE) 20 MG tablet; 2 tablets daily for 3 days, 1 tablet daily for 4 days.  Further disposition pending results of labs. Discussed med's effects and SE's.   Over 30 minutes of exam, counseling, chart review, and critical decision making was performed.   Future Appointments  Date Time Provider [place]  03/04/2018 10:00 AM Liane Comber, NP [facility] None    ------------------------------------------------------------------------------------------------------------------  HPI BP 126/78   Pulse 62   Temp (!) 97.3 F (36.3 C)   Ht 5' 5.5" (1.664 m)   Wt 182 lb (82.6 kg)   SpO2 99%   BMI 29.83 kg/m   62 y.o. overweight AA female  presents for left lateral epicondyle pain and tenderness without edema x 2 months which began suddenly; pain is "achy" and "tender," worse with lifting, pain is 4/10, constant, non-radiating, not swollen. She has not tried anything thus far.    She also c/o left heel pain ongoing for 3-4 months; she has already seen by Dr. Gershon Mussel ? Podiatrist, who she reports took xrays, diagnosed as plantar fascitis and injected with steroid, which she reports was helpful for ~1 month or so. She was also given a boot to wear at night, and reports shoes with raised heel help to relieve pain.   She also reports left lateral thigh burning pain ongoing for 5+ months; she has had vascular US of extremity which was unremarkable; she reports pain is intermittent, typically burning/electrical to anterior/lateral thigh. Denies weakness. She reports she also occasionally experiences muscle spasm type symptoms concurrently. She has been prescribed gabapentin for this but has not been taking. She denies lower back pain or hip joint pain.    Past Medical History:  Diagnosis Date  . Hyperlipidemia   . Paroxysmal atrial fibrillation ([place]) 6/15   detected on ppm interrogation  . PONV (postoperative nausea and vomiting)    has vomitted in past  . Second degree Mobitz II AV block   . Vitamin D deficiency      No Known Allergies  Current Outpatient Medications on File Prior to Visit  Medication Sig  . diphenhydramine-acetaminophen (TYLENOL PM) 25-500 MG TABS tablet Take 1 tablet by mouth at bedtime as  needed (pain/sleep).   . triamcinolone cream (KENALOG) 0.1 % Apply 1 application topically 3 (three) times daily as needed (rash).  . Vitamin D, Ergocalciferol, (DRISDOL) 50000 units CAPS capsule TAKE ONE CAPSULE BY MOUTH TWICE A WEEK  . atenolol (TENORMIN) 25 MG tablet Take 1 tablet (25 mg total) by mouth daily. (Patient not taking: Reported on 02/13/2018)  . gabapentin (NEURONTIN) 100 MG capsule Take 1 capsule (100 mg total) by  mouth 3 (three) times daily. (Patient not taking: Reported on 01/07/2018)  . traZODone (DESYREL) 50 MG tablet 1/2-1 tablet for sleep (Patient not taking: Reported on 02/13/2018)   No current facility-administered medications on file prior to visit.     ROS: all negative except above.   Physical Exam:  BP 126/78   Pulse 62   Temp (!) 97.3 F (36.3 C)   Ht 5' 5.5" (1.664 m)   Wt 182 lb (82.6 kg)   SpO2 99%   BMI 29.83 kg/m   General Appearance: Well nourished, in no apparent distress. Neck: Supple.  Respiratory: Respiratory effort normal Cardio: RRR with no MRGs. Brisk peripheral pulses without edema.  Abdomen: Soft, + BS.  Non tender, no hernias, masses. Lymphatics: Non tender without lymphadenopathy.  Musculoskeletal: Full ROM through lumbar, bilateral hips, knees and ankles. 5/5 strength of lower extremities, antaltic gait. She is tender to anterior heel without palpable bony abnormality. Left lateral epicondyle with point tenderness without erythema/swelling.  Skin: Warm, dry without rashes, lesions, ecchymosis.  Neuro: Normal muscle tone. Sensation intact.  Psych: Awake and oriented X 3, normal affect, Insight and Judgment appropriate.     Alyssa Ribas, NP 4:23 PM [facility]

## 2018-02-13 ENCOUNTER — Ambulatory Visit: Payer: BLUE CROSS/BLUE SHIELD | Admitting: Adult Health

## 2018-02-13 ENCOUNTER — Encounter: Payer: Self-pay | Admitting: Adult Health

## 2018-02-13 VITALS — BP 126/78 | HR 62 | Temp 97.3°F | Ht 65.5 in | Wt 182.0 lb

## 2018-02-13 DIAGNOSIS — M722 Plantar fascial fibromatosis: Secondary | ICD-10-CM

## 2018-02-13 DIAGNOSIS — M7712 Lateral epicondylitis, left elbow: Secondary | ICD-10-CM

## 2018-02-13 DIAGNOSIS — G5712 Meralgia paresthetica, left lower limb: Secondary | ICD-10-CM

## 2018-02-13 MED ORDER — PREDNISONE 20 MG PO TABS: ORAL_TABLET | ORAL | 0 refills | Status: DC

## 2018-02-13 MED ORDER — CYCLOBENZAPRINE HCL 5 MG PO TABS: 5.0000 mg | ORAL_TABLET | Freq: Three times a day (TID) | ORAL | 0 refills | Status: DC | PRN

## 2018-02-13 NOTE — Patient Instructions (Signed)
Take gabapentin and flexeril at night until you know how drowsy this might make   Gabapentin can take 100 mg ~300 mg at once     Plantar Fasciitis Plantar fasciitis is a painful foot condition that affects the heel. It occurs when the band of tissue that connects the toes to the heel bone (plantar fascia) becomes irritated. This can happen after exercising too much or doing other repetitive activities (overuse injury). The pain from plantar fasciitis can range from mild irritation to severe pain that makes it difficult for you to walk or move. The pain is usually worse in the morning or after you have been sitting or lying down for a while. What are the causes? This condition may be caused by:  Standing for long periods of time.  Wearing shoes that do not fit.  Doing high-impact activities, including running, aerobics, and ballet.  Being overweight.  Having an abnormal way of walking (gait).  Having tight calf muscles.  Having high arches in your feet.  Starting a new athletic activity.  What are the signs or symptoms? The main symptom of this condition is heel pain. Other symptoms include:  Pain that gets worse after activity or exercise.  Pain that is worse in the morning or after resting.  Pain that goes away after you walk for a few minutes.  How is this diagnosed? This condition may be diagnosed based on your signs and symptoms. Your health care provider will also do a physical exam to check for:  A tender area on the bottom of your foot.  A high arch in your foot.  Pain when you move your foot.  Difficulty moving your foot.  You may also need to have imaging studies to confirm the diagnosis. These can include:  X-rays.  Ultrasound.  MRI.  How is this treated? Treatment for plantar fasciitis depends on the severity of the condition. Your treatment may include:  Rest, ice, and over-the-counter pain medicines to manage your pain.  Exercises to stretch  your calves and your plantar fascia.  A splint that holds your foot in a stretched, upward position while you sleep (night splint).  Physical therapy to relieve symptoms and prevent problems in the future.  Cortisone injections to relieve severe pain.  Extracorporeal shock wave therapy (ESWT) to stimulate damaged plantar fascia with electrical impulses. It is often used as a last resort before surgery.  Surgery, if other treatments have not worked after 12 months.  Follow these instructions at home:  Take medicines only as directed by your health care provider.  Avoid activities that cause pain.  Roll the bottom of your foot over a bag of ice or a bottle of cold water. Do this for 20 minutes, 3-4 times a day.  Perform simple stretches as directed by your health care provider.  Try wearing athletic shoes with air-sole or gel-sole cushions or soft shoe inserts.  Wear a night splint while sleeping, if directed by your health care provider.  Keep all follow-up appointments with your health care provider. How is this prevented?  Do not perform exercises or activities that cause heel pain.  Consider finding low-impact activities if you continue to have problems.  Lose weight if you need to. The best way to prevent plantar fasciitis is to avoid the activities that aggravate your plantar fascia. Contact a health care provider if:  Your symptoms do not go away after treatment with home care measures.  Your pain gets worse.  Your pain  affects your ability to move or do your daily activities. This information is not intended to replace advice given to you by your health care provider. Make sure you discuss any questions you have with your health care provider. Document Released: 06/19/2001 Document Revised: 02/27/2016 Document Reviewed: 08/04/2014 Elsevier Interactive Patient Education  2018 [place].    Hip Exercises Ask your health care provider which exercises are safe  for you. Do exercises exactly as told by your health care provider and adjust them as directed. It is normal to feel mild stretching, pulling, tightness, or discomfort as you do these exercises, but you should stop right away if you feel sudden pain or your pain gets worse.Do not begin these exercises until told by your health care provider. STRETCHING AND RANGE OF MOTION EXERCISES These exercises warm up your muscles and joints and improve the movement and flexibility of your hip. These exercises also help to relieve pain, numbness, and tingling. Exercise A: Hamstrings, Supine  1. Lie on your back. 2. Loop a belt or towel over the ball of your left / rightfoot. The ball of your foot is on the walking surface, right under your toes. 3. Straighten your left / rightknee and slowly pull on the belt to raise your leg. ? Do not let your left / right knee bend while you do this. ? Keep your other leg flat on the floor. ? Raise the left / right leg until you feel a gentle stretch behind your left / right knee or thigh. 4. Hold this position for __________ seconds. 5. Slowly return your leg to the starting position. Repeat __________ times. Complete this stretch __________ times a day. Exercise B: Hip Rotators  1. Lie on your back on a firm surface. 2. Hold your left / right knee with your left / right hand. Hold your ankle with your other hand. 3. Gently pull your left / right knee and rotate your lower leg toward your other shoulder. ? Pull until you feel a stretch in your buttocks. ? Keep your hips and shoulders firmly planted while you do this stretch. 4. Hold this position for __________ seconds. Repeat __________ times. Complete this stretch __________ times a day. Exercise C: V-Sit (Hamstrings and Adductors)  1. Sit on the floor with your legs extended in a large "V" shape. Keep your knees straight during this exercise. 2. Start with your head and chest upright, then bend at your waist to  reach for your left foot (position A). You should feel a stretch in your right inner thigh. 3. Hold this position for __________ seconds. Then slowly return to the upright position. 4. Bend at your waist to reach forward (position B). You should feel a stretch behind both of your thighs and knees. 5. Hold this position for __________ seconds. Then slowly return to the upright position. 6. Bend at your waist to reach for your right foot (position C). You should feel a stretch in your left inner thigh. 7. Hold this position for __________ seconds. Then slowly return to the upright position. Repeat __________ times. Complete this stretch __________ times a day. Exercise D: Lunge (Hip Flexors)  1. Place your left / right knee on the floor and bend your other knee so that is directly over your ankle. You should be half-kneeling. 2. Keep good posture with your head over your shoulders. 3. Tighten your buttocks to point your tailbone downward. This helps your back to keep from arching too much. 4. You should  feel a gentle stretch in the front of your left / right thigh and hip. If you do not feel any resistance, slightly slide your other foot forward and then slowly lunge forward so your knee once again lines up over your ankle. 5. Make sure your tailbone continues to point downward. 6. Hold this position for __________ seconds. Repeat __________ times. Complete this stretch __________ times a day. STRENGTHENING EXERCISES These exercises build strength and endurance in your hip. Endurance is the ability to use your muscles for a long time, even after they get tired. Exercise E: Bridge (Hip Extensors)  1. Lie on your back on a firm surface with your knees bent and your feet flat on the floor. 2. Tighten your buttocks muscles and lift your bottom off the floor until the trunk of your body is level with your thighs. ? Do not arch your back. ? You should feel the muscles working in your buttocks and the  back of your thighs. If you do not feel these muscles, slide your feet 1-2 inches (2.5-5 cm) farther away from your buttocks. 3. Hold this position for __________ seconds. 4. Slowly lower your hips to the starting position. 5. Let your muscles relax completely between repetitions. 6. If this exercise is too easy, try doing it with your arms crossed over your chest. Repeat __________ times. Complete this exercise __________ times a day. Exercise F: Straight Leg Raises - Hip Abductors  1. Lie on your side with your left / right leg in the top position. Lie so your head, shoulder, knee, and hip line up with each other. You may bend your bottom knee to help you balance. 2. Roll your hips slightly forward, so your hips are stacked directly over each other and your left / right knee is facing forward. 3. Leading with your heel, lift your top leg 4-6 inches (10-15 cm). You should feel the muscles in your outer hip lifting. ? Do not let your foot drift forward. ? Do not let your knee roll toward the ceiling. 4. Hold this position for __________ seconds. 5. Slowly return to the starting position. 6. Let your muscles relax completely between repetitions. Repeat __________ times. Complete this exercise __________ times a day. Exercise G: Straight Leg Raises - Hip Adductors  1. Lie on your side with your left / right leg in the bottom position. Lie so your head, shoulder, knee, and hip line up. You may place your upper foot in front to help you balance. 2. Roll your hips slightly forward, so your hips are stacked directly over each other and your left / right knee is facing forward. 3. Tense the muscles in your inner thigh and lift your bottom leg 4-6 inches (10-15 cm). 4. Hold this position for __________ seconds. 5. Slowly return to the starting position. 6. Let your muscles relax completely between repetitions. Repeat __________ times. Complete this exercise __________ times a day. Exercise H:  Straight Leg Raises - Quadriceps  1. Lie on your back with your left / right leg extended and your other knee bent. 2. Tense the muscles in the front of your left / right thigh. When you do this, you should see your kneecap slide up or see increased dimpling just above your knee. 3. Tighten these muscles even more and raise your leg 4-6 inches (10-15 cm) off the floor. 4. Hold this position for __________ seconds. 5. Keep these muscles tense as you lower your leg. 6. Relax the muscles slowly and  completely between repetitions. Repeat __________ times. Complete this exercise __________ times a day. Exercise I: Hip Abductors, Standing 1. Tie one end of a rubber exercise band or tubing to a secure surface, such as a table or pole. 2. Loop the other end of the band or tubing around your left / right ankle. 3. Keeping your ankle with the band or tubing directly opposite of the secured end, step away until there is tension in the tubing or band. Hold onto a chair as needed for balance. 4. Lift your left / right leg out to your side. While you do this: ? Keep your back upright. ? Keep your shoulders over your hips. ? Keep your toes pointing forward. ? Make sure to use your hip muscles to lift your leg. Do not "throw" your leg or tip your body to lift your leg. 5. Hold this position for __________ seconds. 6. Slowly return to the starting position. Repeat __________ times. Complete this exercise __________ times a day. Exercise J: Squats (Quadriceps) 1. Stand in a door frame so your feet and knees are in line with the frame. You may place your hands on the frame for balance. 2. Slowly bend your knees and lower your hips like you are going to sit in a chair. ? Keep your lower legs in a straight-up-and-down position. ? Do not let your hips go lower than your knees. ? Do not bend your knees lower than told by your health care provider. ? If your hip pain increases, do not bend as low. 3. Hold this  position for ___________ seconds. 4. Slowly push with your legs to return to standing. Do not use your hands to pull yourself to standing. Repeat __________ times. Complete this exercise __________ times a day. This information is not intended to replace advice given to you by your health care provider. Make sure you discuss any questions you have with your health care provider. Document Released: 10/12/2005 Document Revised: 06/18/2016 Document Reviewed: 09/19/2015 Elsevier Interactive Patient Education  [employer].

## 2018-02-19 DIAGNOSIS — M722 Plantar fascial fibromatosis: Secondary | ICD-10-CM | POA: Diagnosis not present

## 2018-02-19 DIAGNOSIS — M71572 Other bursitis, not elsewhere classified, left ankle and foot: Secondary | ICD-10-CM | POA: Diagnosis not present

## 2018-02-26 DIAGNOSIS — M722 Plantar fascial fibromatosis: Secondary | ICD-10-CM | POA: Diagnosis not present

## 2018-02-26 DIAGNOSIS — M71572 Other bursitis, not elsewhere classified, left ankle and foot: Secondary | ICD-10-CM | POA: Diagnosis not present

## 2018-02-26 DIAGNOSIS — Z01812 Encounter for preprocedural laboratory examination: Secondary | ICD-10-CM | POA: Diagnosis not present

## 2018-03-02 NOTE — Progress Notes (Signed)
Complete Physical  Assessment and Plan:  Encounter for routine adult health examination with abnormal findings  Mobitz type 2 second degree and high-grade heart block Pacemaker in place, continue follow up with Dr. [name]  Essential hypertension Continue medication Monitor blood pressure at home; call if consistently over 130/80 Continue DASH diet.   Reminder to go to the ER if any CP, SOB, nausea, dizziness, severe HA, changes vision/speech, left arm numbness and tingling and jaw pain.  Paroxysmal atrial fibrillation (HCC) CHADSASC 1, not on anticoag Followed by cardiology  Gastroesophageal reflux disease, esophagitis presence not specified Well managed on current medications; PRN OTC agents Discussed diet, avoiding triggers and other lifestyle changes  Benign neoplasm of colon, unspecified part of colon Overdue for colonoscopy follow up  Will refer back to GI   Meralgia paraesthetica, left Weight loss advised   Plantar fasciitis of left foot Following with podiatry, having surgery 04/23/2018  Lateral epicondylitis of left elbow Wear band, rest, NSAIDS  Vitamin D deficiency Continue supplementation for goal of 70-100 Check vitamin D level  Overweight (BMI 25.0-29.9) Long discussion about weight loss, diet, and exercise Recommended diet heavy in fruits and veggies and low in animal meats, cheeses, and dairy products, appropriate calorie intake Patient will work on cutting down on sugary snacks Discussed appropriate weight for height and initial goal (170 lb) Follow up at next visit  Medication management CBC, CMP/GFR, UA, magnesium  Pure hypercholesterolemia Currently at goal by lifestyle modification Continue low cholesterol diet and exercise.  Check lipid panel.   History of peptic ulcer disease Avoid NSAIDS  Abnormal glucose Discussed diet/exercise, weight management  A1C  Orders Placed This Encounter  Procedures  . CBC with Differential/Platelet  .  COMPLETE METABOLIC PANEL WITH GFR  . Magnesium  . Lipid panel  . TSH  . Hemoglobin A1c  . VITAMIN D 25 Hydroxy (Vit-D Deficiency, Fractures)  . Urinalysis w microscopic + reflex cultur  . Microalbumin / creatinine urine ratio     Discussed med's effects and SE's. Screening labs and tests as requested with regular follow-up as recommended. Over 40 minutes of exam, counseling, chart review and critical decision making was performed  Future Appointments  Date Time Provider [place]  03/09/2019 10:00 AM Liane Comber, NP [facility] None     HPI  This very nice 62 y.o.female presents for complete physical. has GERD; Constipation; History of peptic ulcer disease; Family history of malignant neoplasm of gastrointestinal tract; Benign neoplasm of colon; Mobitz type 2 second degree and high-grade heart block; Hyperlipidemia; Vitamin D deficiency; Atrial fibrillation ([place]); HTN (hypertension); Abnormal glucose; Medication management; Overweight (BMI 25.0-29.9); Meralgia paraesthetica, left; Plantar fasciitis of left foot; and Lateral epicondylitis of left elbow on their problem list. She has Afib and mobitz type 2 with pacemaker placement in 2014, follows with Dr. [name] and Leonides Schanz, low risk CHADSVASC 1 and not on anticoagulation. Patient reports no complaints at this time.   BMI is Body mass index is 29.5 kg/m., she has been working on diet and exercise.  Wt Readings from Last 3 Encounters:  03/04/18 180 lb (81.6 kg)  02/13/18 182 lb (82.6 kg)  01/07/18 182 lb (82.6 kg)   Her blood pressure has been controlled at home, today their BP is BP: 130/74  She does workout. She denies chest pain, shortness of breath, dizziness.   She is not on cholesterol medication. Her cholesterol is at goal. The cholesterol last visit was:   Lab Results  Component Value Date  CHOL 162 10/22/2017   HDL 61 10/22/2017   LDLCALC 86 10/22/2017   TRIG 60 10/22/2017   CHOLHDL 2.7 10/22/2017    She has  been working on diet and exercise for glucose management, and denies foot ulcerations, increased appetite, nausea, paresthesia of the feet, polydipsia, polyuria, visual disturbances and vomiting. Last A1C in the office was:  Lab Results  Component Value Date   HGBA1C 5.3 07/26/2017   Patient is on Vitamin D supplement.   Lab Results  Component Value Date   VD25OH 59 07/26/2017       Current Medications:  Current Outpatient Medications on File Prior to Visit  Medication Sig Dispense Refill  . diphenhydramine-acetaminophen (TYLENOL PM) 25-500 MG TABS tablet Take 1 tablet by mouth at bedtime as needed (pain/sleep).     . triamcinolone cream (KENALOG) 0.1 % Apply 1 application topically 3 (three) times daily as needed (rash).    . Vitamin D, Ergocalciferol, (DRISDOL) 50000 units CAPS capsule TAKE ONE CAPSULE BY MOUTH TWICE A WEEK 60 capsule 0   No current facility-administered medications on file prior to visit.    Health Maintenance:   Immunization History  Administered Date(s) Administered  . Influenza Inj Mdck Quad With Preservative 07/26/2017  . Influenza Split 06/30/2013, 07/20/2014, 07/21/2015  . Influenza,inj,quad, With Preservative 07/05/2016  . PPD Test 02/22/2014, 05/10/2015  . Tdap 10/09/2011  . Zoster Recombinat (Shingrix) 01/09/2017, 02/06/2018   Colonoscopy: 10/15/11 due 2018 with polyps and FHX  Mammo: 08/2016 - will schedule follow BMD: 2015 WNL will wait until closer to 66 Pap/ Pelvic: 2018 WNL - HPV neg - repeat 2023 AB [place] 2011 CXR 2014 Echo 01/2013  IMMUNIZATIONS: Tdap:2013 Pneumovax n/a: Prevnar 13: due at 48 Shingrix: 2/2 - 2019 Influenza:2018  EYE: Dr. Peter Garter, last 2018, wears glasses Dentist: Dr. Glynn Octave, Q 38month  Allergies: No Known Allergies Medical History:  has GERD; Constipation; History of peptic ulcer disease; Family history of malignant neoplasm of gastrointestinal tract; Benign neoplasm of colon; Mobitz type 2 second degree and high-grade  heart block; Hyperlipidemia; Vitamin D deficiency; Atrial fibrillation ([place]; HTN (hypertension); Abnormal glucose; Medication management; Overweight (BMI 25.0-29.9); Meralgia paraesthetica, left; Plantar fasciitis of left foot; and Lateral epicondylitis of left elbow on their problem list. Surgical History:  She  has a past surgical history that includes Breast cyst excision (1980); Wrist surgery (Right, 2011); and permanent pacemaker insertion (N/A, 01/13/2013). Family History:  Her family history includes Anemia in her sister; Cervical cancer in her mother; Colon cancer (age of onset: 527 in her sister; Diabetes in her mother; HIV/AIDS in her brother; Heart disease in her father; Hyperlipidemia in her father; Hypertension in her mother and son; Kidney disease in her son; Multiple myeloma in her maternal aunt; Multiple myeloma (age of onset: 379 in her son; Prostate cancer (age of onset: 641 in her brother. Social History:   reports that she has never smoked. She has never used smokeless tobacco. She reports that she does not drink alcohol or use drugs.  Review of Systems: Review of Systems  Constitutional: Negative for malaise/fatigue and weight loss.  HENT: Negative for hearing loss and tinnitus.   Eyes: Negative for blurred vision and double vision.  Respiratory: Negative for cough, sputum production, shortness of breath and wheezing.   Cardiovascular: Negative for chest pain, palpitations, orthopnea, claudication, leg swelling and PND.  Gastrointestinal: Negative for abdominal pain, blood in stool, constipation, diarrhea, heartburn, melena, nausea and vomiting.  Genitourinary: Negative.   Musculoskeletal: Negative for falls,  joint pain and myalgias.  Skin: Negative for rash.  Neurological: Negative for dizziness, tingling, sensory change, weakness and headaches.  Endo/Heme/Allergies: Negative for polydipsia.  Psychiatric/Behavioral: Negative.  Negative for depression, memory loss, substance  abuse and suicidal ideas. The patient is not nervous/anxious and does not have insomnia.   All other systems reviewed and are negative.   Physical Exam: Estimated body mass index is 29.5 kg/m as calculated from the following:   Height as of this encounter: 5' 5.5" (1.664 m).   Weight as of this encounter: 180 lb (81.6 kg). BP 130/74   Pulse 72   Temp (!) 97.3 F (36.3 C)   Ht 5' 5.5" (1.664 m)   Wt 180 lb (81.6 kg)   SpO2 96%   BMI 29.50 kg/m  General Appearance: Well nourished, in no apparent distress.  Eyes: PERRLA, EOMs, conjunctiva no swelling or erythema, normal fundi and vessels.  Sinuses: No Frontal/maxillary tenderness  ENT/Mouth: Ext aud canals clear, normal light reflex with TMs without erythema, bulging. Good dentition. No erythema, swelling, or exudate on post pharynx. Tonsils not swollen or erythematous. Hearing normal.  Neck: Supple, thyroid normal. No bruits  Respiratory: Respiratory effort normal, BS equal bilaterally without rales, rhonchi, wheezing or stridor.  Cardio: RRR without murmurs, rubs or gallops. Brisk peripheral pulses without edema.  Chest: symmetric, with normal excursions and percussion.  Breasts: Symmetric, without lumps, nipple discharge, retractions.  Abdomen: Soft, nontender, no guarding, rebound, hernias, masses, or organomegaly.  Lymphatics: Non tender without lymphadenopathy.  Genitourinary: Defer Musculoskeletal: Full ROM all peripheral extremities,5/5 strength, and normal gait.  Skin: Warm, dry without rashes, lesions, ecchymosis. Neuro: Cranial nerves intact, reflexes equal bilaterally. Normal muscle tone, no cerebellar symptoms. Sensation intact.  Psych: Awake and oriented X 3, normal affect, Insight and Judgment appropriate.   EKG: WNL - no significant changes from previous  Izora Ribas 10:33 AM [facility]

## 2018-03-04 ENCOUNTER — Encounter: Payer: Self-pay | Admitting: Physician Assistant

## 2018-03-04 ENCOUNTER — Encounter: Payer: Self-pay | Admitting: Adult Health

## 2018-03-04 ENCOUNTER — Ambulatory Visit: Payer: BLUE CROSS/BLUE SHIELD | Admitting: Adult Health

## 2018-03-04 VITALS — BP 130/74 | HR 72 | Temp 97.3°F | Ht 65.5 in | Wt 180.0 lb

## 2018-03-04 DIAGNOSIS — R7309 Other abnormal glucose: Secondary | ICD-10-CM

## 2018-03-04 DIAGNOSIS — I10 Essential (primary) hypertension: Secondary | ICD-10-CM

## 2018-03-04 DIAGNOSIS — Z0001 Encounter for general adult medical examination with abnormal findings: Secondary | ICD-10-CM

## 2018-03-04 DIAGNOSIS — Z1329 Encounter for screening for other suspected endocrine disorder: Secondary | ICD-10-CM

## 2018-03-04 DIAGNOSIS — E7800 Pure hypercholesterolemia, unspecified: Secondary | ICD-10-CM

## 2018-03-04 DIAGNOSIS — I480 Paroxysmal atrial fibrillation: Secondary | ICD-10-CM

## 2018-03-04 DIAGNOSIS — Z79899 Other long term (current) drug therapy: Secondary | ICD-10-CM | POA: Diagnosis not present

## 2018-03-04 DIAGNOSIS — M722 Plantar fascial fibromatosis: Secondary | ICD-10-CM

## 2018-03-04 DIAGNOSIS — M7712 Lateral epicondylitis, left elbow: Secondary | ICD-10-CM

## 2018-03-04 DIAGNOSIS — I441 Atrioventricular block, second degree: Secondary | ICD-10-CM

## 2018-03-04 DIAGNOSIS — Z13220 Encounter for screening for lipoid disorders: Secondary | ICD-10-CM

## 2018-03-04 DIAGNOSIS — Z1389 Encounter for screening for other disorder: Secondary | ICD-10-CM | POA: Diagnosis not present

## 2018-03-04 DIAGNOSIS — E663 Overweight: Secondary | ICD-10-CM

## 2018-03-04 DIAGNOSIS — K219 Gastro-esophageal reflux disease without esophagitis: Secondary | ICD-10-CM

## 2018-03-04 DIAGNOSIS — Z136 Encounter for screening for cardiovascular disorders: Secondary | ICD-10-CM

## 2018-03-04 DIAGNOSIS — Z131 Encounter for screening for diabetes mellitus: Secondary | ICD-10-CM

## 2018-03-04 DIAGNOSIS — Z8711 Personal history of peptic ulcer disease: Secondary | ICD-10-CM

## 2018-03-04 DIAGNOSIS — Z0000 Encounter for general adult medical examination without abnormal findings: Secondary | ICD-10-CM | POA: Diagnosis not present

## 2018-03-04 DIAGNOSIS — E559 Vitamin D deficiency, unspecified: Secondary | ICD-10-CM | POA: Diagnosis not present

## 2018-03-04 DIAGNOSIS — D126 Benign neoplasm of colon, unspecified: Secondary | ICD-10-CM

## 2018-03-04 DIAGNOSIS — G5712 Meralgia paresthetica, left lower limb: Secondary | ICD-10-CM

## 2018-03-04 NOTE — Patient Instructions (Addendum)
[facility]  7 a.m.-6:30 p.m., Monday 7 a.m.-5 p.m., Tuesday-Friday Schedule an appointment by calling [phone].  [facility] Schedule an appointment by calling [phone].      Aim for 7+ servings of fruits and vegetables daily  80+ fluid ounces of water or unsweet tea for healthy kidneys  Limit alcohol intake   Limit animal fats in diet for cholesterol and heart health - choose grass fed whenever available  Aim for low stress - take time to unwind and care for your mental health  Aim for 150 min of moderate intensity exercise weekly for heart health, and weights twice weekly for bone health  Aim for 7-9 hours of sleep daily      When it comes to diets, agreement about the perfect plan isn't easy to find, even among the experts. Experts at the [employer] developed an idea known as the Healthy Eating Plate. Just imagine a plate divided into logical, healthy portions.  The emphasis is on diet quality:  Load up on vegetables and fruits - one-half of your plate: Aim for color and variety, and remember that potatoes don't count.  Go for whole grains - one-quarter of your plate: Whole wheat, barley, wheat berries, quinoa, oats, brown rice, and foods made with them. If you want pasta, go with whole wheat pasta.  Protein power - one-quarter of your plate: Fish, chicken, beans, and nuts are all healthy, versatile protein sources. Limit red meat.  The diet, however, does go beyond the plate, offering a few other suggestions.  Use healthy plant oils, such as olive, canola, soy, corn, sunflower and peanut. Check the labels, and avoid partially hydrogenated oil, which have unhealthy trans fats.  If you're thirsty, drink water. Coffee and tea are good in moderation, but skip sugary drinks and limit milk and dairy products to one or two daily servings.  The type of carbohydrate in the diet is more important than the  amount. Some sources of carbohydrates, such as vegetables, fruits, whole grains, and beans-are healthier than others.  Finally, stay active.

## 2018-03-05 LAB — CBC WITH DIFFERENTIAL/PLATELET
Basophils Absolute: 19 cells/uL (ref 0–200)
Basophils Relative: 0.3 %
Eosinophils Absolute: 69 cells/uL (ref 15–500)
Eosinophils Relative: 1.1 %
HCT: 42.1 % (ref 35.0–45.0)
Hemoglobin: 14.3 g/dL (ref 11.7–15.5)
Lymphs Abs: 1997 cells/uL (ref 850–3900)
MCH: 30.4 pg (ref 27.0–33.0)
MCHC: 34 g/dL (ref 32.0–36.0)
MCV: 89.6 fL (ref 80.0–100.0)
MPV: 9.9 fL (ref 7.5–12.5)
Monocytes Relative: 9.9 %
Neutro Abs: 3591 cells/uL (ref 1500–7800)
Neutrophils Relative %: 57 %
Platelets: 251 10*3/uL (ref 140–400)
RBC: 4.7 10*6/uL (ref 3.80–5.10)
RDW: 12.9 % (ref 11.0–15.0)
Total Lymphocyte: 31.7 %
WBC mixed population: 624 cells/uL (ref 200–950)
WBC: 6.3 10*3/uL (ref 3.8–10.8)

## 2018-03-05 LAB — URINALYSIS W MICROSCOPIC + REFLEX CULTURE
Bacteria, UA: NONE SEEN /HPF
Bilirubin Urine: NEGATIVE
Glucose, UA: NEGATIVE
Hgb urine dipstick: NEGATIVE
Hyaline Cast: NONE SEEN /LPF
Ketones, ur: NEGATIVE
Leukocyte Esterase: NEGATIVE
Nitrites, Initial: NEGATIVE
Protein, ur: NEGATIVE
RBC / HPF: NONE SEEN /HPF (ref 0–2)
Specific Gravity, Urine: 1.024 (ref 1.001–1.03)
Squamous Epithelial / LPF: NONE SEEN /HPF (ref <=5)
pH: <=5 (ref 5.0–8.0)

## 2018-03-05 LAB — COMPLETE METABOLIC PANEL WITH GFR
AG Ratio: 1.5 (calc) (ref 1.0–2.5)
ALT: 16 U/L (ref 6–29)
AST: 14 U/L (ref 10–35)
Albumin: 4.6 g/dL (ref 3.6–5.1)
Alkaline phosphatase (APISO): 107 U/L (ref 33–130)
BUN: 12 mg/dL (ref 7–25)
CO2: 29 mmol/L (ref 20–32)
Calcium: 9.8 mg/dL (ref 8.6–10.4)
Chloride: 106 mmol/L (ref 98–110)
Creat: 0.75 mg/dL (ref 0.50–0.99)
GFR, Est African American: 100 mL/min/{1.73_m2} (ref >=60)
GFR, Est Non African American: 87 mL/min/{1.73_m2} (ref >=60)
Globulin: 3.1 g/dL (calc) (ref 1.9–3.7)
Glucose, Bld: 86 mg/dL (ref 65–99)
Potassium: 4.1 mmol/L (ref 3.5–5.3)
Sodium: 144 mmol/L (ref 135–146)
Total Bilirubin: 0.9 mg/dL (ref 0.2–1.2)
Total Protein: 7.7 g/dL (ref 6.1–8.1)

## 2018-03-05 LAB — LIPID PANEL
Cholesterol: 231 mg/dL — ABNORMAL HIGH (ref <200)
HDL: 65 mg/dL (ref >50)
LDL Cholesterol (Calc): 151 mg/dL (calc) — ABNORMAL HIGH
Non-HDL Cholesterol (Calc): 166 mg/dL (calc) — ABNORMAL HIGH (ref <130)
Total CHOL/HDL Ratio: 3.6 (calc) (ref <5.0)
Triglycerides: 58 mg/dL (ref <150)

## 2018-03-05 LAB — TSH: TSH: 2.53 mIU/L (ref 0.40–4.50)

## 2018-03-05 LAB — MICROALBUMIN / CREATININE URINE RATIO
Creatinine, Urine: 205 mg/dL (ref 20–275)
Microalb Creat Ratio: 3 mcg/mg creat (ref <30)
Microalb, Ur: 0.6 mg/dL

## 2018-03-05 LAB — HEMOGLOBIN A1C
Hgb A1c MFr Bld: 5.5 % of total Hgb (ref <5.7)
Mean Plasma Glucose: 111 (calc)
eAG (mmol/L): 6.2 (calc)

## 2018-03-05 LAB — MAGNESIUM: Magnesium: 2.2 mg/dL (ref 1.5–2.5)

## 2018-03-05 LAB — VITAMIN D 25 HYDROXY (VIT D DEFICIENCY, FRACTURES): Vit D, 25-Hydroxy: 53 ng/mL (ref 30–100)

## 2018-03-05 LAB — NO CULTURE INDICATED

## 2018-03-06 ENCOUNTER — Encounter: Payer: Self-pay | Admitting: Internal Medicine

## 2018-03-26 NOTE — Addendum Note (Signed)
Addended by: Izora Ribas on: 03/26/2018 08:25 AM   Modules accepted: Orders

## 2018-03-30 ENCOUNTER — Other Ambulatory Visit: Payer: Self-pay

## 2018-03-30 ENCOUNTER — Emergency Department (HOSPITAL_COMMUNITY)
Admission: EM | Admit: 2018-03-30 | Discharge: 2018-03-30 | Disposition: A | Discharge status: Home / Self Care | Payer: BLUE CROSS/BLUE SHIELD | Attending: Emergency Medicine | Admitting: Emergency Medicine

## 2018-03-30 ENCOUNTER — Encounter (HOSPITAL_COMMUNITY): Payer: Self-pay

## 2018-03-30 ENCOUNTER — Emergency Department (HOSPITAL_COMMUNITY): Payer: BLUE CROSS/BLUE SHIELD

## 2018-03-30 DIAGNOSIS — Z7982 Long term (current) use of aspirin: Secondary | ICD-10-CM | POA: Insufficient documentation

## 2018-03-30 DIAGNOSIS — R079 Chest pain, unspecified: Secondary | ICD-10-CM | POA: Diagnosis not present

## 2018-03-30 DIAGNOSIS — Z7901 Long term (current) use of anticoagulants: Secondary | ICD-10-CM | POA: Insufficient documentation

## 2018-03-30 DIAGNOSIS — R0789 Other chest pain: Secondary | ICD-10-CM | POA: Diagnosis not present

## 2018-03-30 DIAGNOSIS — Z950 Presence of cardiac pacemaker: Secondary | ICD-10-CM | POA: Insufficient documentation

## 2018-03-30 DIAGNOSIS — I10 Essential (primary) hypertension: Secondary | ICD-10-CM | POA: Insufficient documentation

## 2018-03-30 LAB — BASIC METABOLIC PANEL
Anion gap: 8 (ref 5–15)
BUN: 9 mg/dL (ref 6–20)
CO2: 26 mmol/L (ref 22–32)
Calcium: 9.1 mg/dL (ref 8.9–10.3)
Chloride: 107 mmol/L (ref 101–111)
Creatinine, Ser: 0.85 mg/dL (ref 0.44–1.00)
GFR calc Af Amer: >60 mL/min (ref >60)
GFR calc non Af Amer: >60 mL/min (ref >60)
Glucose, Bld: 88 mg/dL (ref 65–99)
Potassium: 3.9 mmol/L (ref 3.5–5.1)
Sodium: 141 mmol/L (ref 135–145)

## 2018-03-30 LAB — CBC
HCT: 43.9 % (ref 36.0–46.0)
Hemoglobin: 14.1 g/dL (ref 12.0–15.0)
MCH: 30.6 pg (ref 26.0–34.0)
MCHC: 32.1 g/dL (ref 30.0–36.0)
MCV: 95.2 fL (ref 78.0–100.0)
Platelets: 231 10*3/uL (ref 150–400)
RBC: 4.61 MIL/uL (ref 3.87–5.11)
RDW: 13.3 % (ref 11.5–15.5)
WBC: 7.8 10*3/uL (ref 4.0–10.5)

## 2018-03-30 LAB — I-STAT TROPONIN, ED
Troponin i, poc: 0.01 ng/mL (ref 0.00–0.08)
Troponin i, poc: 0.01 ng/mL (ref 0.00–0.08)

## 2018-03-30 MED ORDER — RANITIDINE HCL 150 MG PO CAPS: 150.0000 mg | ORAL_CAPSULE | Freq: Two times a day (BID) | ORAL | 0 refills | Status: DC

## 2018-03-30 MED ORDER — OMEPRAZOLE 20 MG PO CPDR: 20.0000 mg | DELAYED_RELEASE_CAPSULE | Freq: Two times a day (BID) | ORAL | 0 refills | Status: DC

## 2018-03-30 MED ORDER — GI COCKTAIL ~~LOC~~
30.0000 mL | Freq: Once | ORAL | Status: AC
  Administered 2018-03-30: 30 mL via ORAL
  Filled 2018-03-30: qty 30

## 2018-03-30 NOTE — ED Notes (Signed)
Received call for interrogation of pt pacemaker- per [employer] rep the only finding was a 3 sec run of atrial tachycardia on June 18th. EDP and patient made aware.

## 2018-03-30 NOTE — ED Provider Notes (Signed)
[facility] EMERGENCY DEPARTMENT Provider Note   CSN: [id number] Arrival date & time: 03/30/18  5009     History   Chief Complaint No chief complaint on file.   HPI Alyssa Dudley is a 62 y.o. female.  HPI  62 year old female with history of hypertension, heart block with pacemaker, atrial fibrillation not currently on anticoagulants, GERD, PUD presenting complaint of chest pain.  Patient report yesterday morning she experienced sharp central chest pain lasting for me a few minutes.  It went away, last night after eating, she experienced the same sensation.  She ignored and this morning she was awoke with the same sharp gnawing pain to her mid chest as well as a mild discomfort to the right side of her jaw which concerns her prompting her to come here.  Currently the pain has subsided and she endorsed some mild indigestion feeling.  She denies any associated lightheadedness, dizziness, shortness of breath, diaphoresis or nausea accompanied with the pain.  Pain is nonexertional.  Nothing aggravated or alleviated.  No specific treatment tried.  No complaints of coughing, hemoptysis, back pain or arm pain.  She denies any heart palpitation.  Is worse, pain was rated as 9 out of 10, now, 2 out of 10.  Patient denies tobacco abuse but does report family history of cardiac disease.  Past Medical History:  Diagnosis Date  . Hyperlipidemia   . Paroxysmal atrial fibrillation ([place]) 6/15   detected on ppm interrogation  . PONV (postoperative nausea and vomiting)    has vomitted in past  . Second degree Mobitz II AV block   . Vitamin D deficiency     Patient Active Problem List   Diagnosis Date Noted  . Meralgia paraesthetica, left 02/13/2018  . Plantar fasciitis of left foot 02/13/2018  . Lateral epicondylitis of left elbow 02/13/2018  . Overweight (BMI 25.0-29.9) 02/25/2015  . HTN (hypertension) 08/17/2014  . Abnormal glucose 08/17/2014  . Medication management  08/17/2014  . Atrial fibrillation ([place]) 06/07/2014  . Hyperlipidemia   . Vitamin D deficiency   . Mobitz type 2 second degree and high-grade heart block 01/07/2013  . Family history of malignant neoplasm of gastrointestinal tract 10/15/2011  . Benign neoplasm of colon 10/15/2011  . GERD 01/10/2008  . Constipation 01/10/2008  . History of peptic ulcer disease 01/10/2008    Past Surgical History:  Procedure Laterality Date  . BREAST CYST EXCISION  1980  . PERMANENT PACEMAKER INSERTION N/A 01/13/2013   SJM [facility] DR RF implanted by Dr [name] for mobitz II AV block  . WRIST SURGERY Right 2011   removed cyst     OB History   None      Home Medications    Prior to Admission medications   Medication Sig Start Date End Date Taking? Authorizing Provider  aspirin EC 81 MG tablet Take 81 mg by mouth daily.    [provider]  diphenhydramine-acetaminophen (TYLENOL PM) 25-500 MG TABS tablet Take 1 tablet by mouth at bedtime as needed (pain/sleep).     [provider]  triamcinolone cream (KENALOG) 0.1 % Apply 1 application topically 3 (three) times daily as needed (rash).    [provider]  Vitamin D, Ergocalciferol, (DRISDOL) 50000 units CAPS capsule TAKE ONE CAPSULE BY MOUTH TWICE A WEEK 11/01/17   Vicie Mutters, PA-C    Family History Family History  Problem Relation Age of Onset  . Colon cancer Sister 76  . Anemia Sister   . Heart disease  Father   . Hyperlipidemia Father   . Prostate cancer Brother 55  . Multiple myeloma Son 34  . Hypertension Son   . Kidney disease Son   . Multiple myeloma Maternal Aunt   . Cervical cancer Mother   . Hypertension Mother   . Diabetes Mother   . HIV/AIDS Brother     Social History Social History   Tobacco Use  . Smoking status: Never Smoker  . Smokeless tobacco: Never Used  Substance Use Topics  . Alcohol use: No  . Drug use: No     Allergies   Patient has no known allergies.   Review of  Systems Review of Systems  All other systems reviewed and are negative.    Physical Exam Updated Vital Signs BP (!) 185/95   Pulse 74   Temp 97.8 F (36.6 C)   Resp 19   Ht '5\' 5"'  (1.651 m)   Wt 80.7 kg (178 lb)   SpO2 100%   BMI 29.62 kg/m   Physical Exam  Constitutional: She is oriented to person, place, and time. She appears well-developed and well-nourished. No distress.  HENT:  Head: Atraumatic.  Mouth/Throat: Oropharynx is clear and moist.  Eyes: Conjunctivae are normal.  Neck: Normal range of motion. Neck supple.  Cardiovascular: Normal rate, regular rhythm and intact distal pulses.  Pulmonary/Chest: Effort normal and breath sounds normal.  Abdominal: Soft. Bowel sounds are normal. She exhibits no distension. There is no tenderness.  Musculoskeletal: She exhibits no edema.  Neurological: She is alert and oriented to person, place, and time.  Skin: No rash noted.  Psychiatric: She has a normal mood and affect.  Nursing note and vitals reviewed.    ED Treatments / Results  Labs (all labs ordered are listed, but only abnormal results are displayed) Labs Reviewed  BASIC METABOLIC PANEL  CBC  I-STAT TROPONIN, ED  I-STAT TROPONIN, ED    EKG EKG Interpretation  Date/Time:  Sunday March 30 2018 06:20:20 EDT Ventricular Rate:  72 PR Interval:    QRS Duration: 174 QT Interval:  442 QTC Calculation: 484 R Axis:   -71 Text Interpretation:  AV dual-paced rhythm No significant change was found Confirmed by Jola Schmidt [id number]) on 03/30/2018 7:09:14 AM   Radiology Dg Chest 2 View  Result Date: 03/30/2018 CLINICAL DATA:  Centralized chest pain woke patient from sleep over the last 1-2 days. Right jaw pain today. Nonsmoker. EXAM: CHEST - 2 VIEW COMPARISON:  None. FINDINGS: Cardiac pacemaker. Normal heart size and pulmonary vascularity. No focal airspace disease or consolidation in the lungs. No blunting of costophrenic angles. No pneumothorax. Mediastinal contours  appear intact. IMPRESSION: No active cardiopulmonary disease. Electronically Signed   By: Lucienne Capers M.D.   On: 03/30/2018 06:58    Procedures Procedures (including critical care time)  Medications Ordered in ED Medications  gi cocktail (Maalox,Lidocaine,Donnatal) (30 mLs Oral Given 03/30/18 9518)     Initial Impression / Assessment and Plan / ED Course  I have reviewed the triage vital signs and the nursing notes.  Pertinent labs & imaging results that were available during my care of the patient were reviewed by me and considered in my medical decision making (see chart for details).     BP 134/72 (BP Location: Right Arm)   Pulse 66   Temp 98 F (36.7 C) (Oral)   Resp 14   Ht '5\' 5"'  (1.651 m)   Wt 80.7 kg (178 lb)   SpO2 98%   BMI  29.62 kg/m    Final Clinical Impressions(s) / ED Diagnoses   Final diagnoses:  Atypical chest pain    ED Discharge Orders        Ordered    omeprazole (PRILOSEC) 20 MG capsule  2 times daily before meals     03/30/18 1135    ranitidine (ZANTAC) 150 MG capsule  2 times daily     03/30/18 1135     6:41 AM Patient presents with midsternal, center chest pain atypical for ACS.  Pain suggestive of gastritis/GERD based on presentation.  Plan to complete heart score, perform medical screening including EKG, troponin, chest x-ray, and basic labs.  She is currently resting comfortably.  She has hypertensive, with underlying history of hypertension.  7:10 AM HEART score 2, low risk of MACE.  Care discussed with Dr. Venora Maples.   11:33 AM Negative delta troponin, patient report her symptoms improved with GI cocktail.  2 pacemaker was interrogated without any concerning incident.  She does have a short course of atrial tachycardia 6 days prior which is unlikely to be related to her presenting complaint.  Patient discharged home with Prilosec, and Zantac.  Recommend follow-up with PCP for outpatient cardiac work-up including stress test.  Return  precautions discussed.  Low suspicion for PE.   Domenic Moras, PA-C 03/30/18 1137    Jola Schmidt, MD 03/31/18 [phone]

## 2018-03-30 NOTE — Discharge Instructions (Signed)
Your chest pain is likely due to heart burn.  Take zantac and prilosec 30 minutes before each major meal.  Follow up with your doctor for further evaluation.  You may benefit from outpatient cardiac stress test. Return if you have any concerns.

## 2018-03-30 NOTE — ED Triage Notes (Addendum)
Pt. From home with reports of chest pain that woke her up from her sleep. Pt. Reports sharp center chest pain that radiated to lower jaw. Pt. Reports having a pace maker. A/O X4

## 2018-03-30 NOTE — ED Notes (Signed)
Patient transported to X-ray 

## 2018-03-30 NOTE — ED Notes (Signed)
Patient verbalizes understanding of discharge instructions. Opportunity for questioning and answers were provided. Armband removed by staff, pt discharged from ED.  

## 2018-03-31 ENCOUNTER — Encounter: Payer: Self-pay | Admitting: Adult Health

## 2018-03-31 ENCOUNTER — Ambulatory Visit (INDEPENDENT_AMBULATORY_CARE_PROVIDER_SITE_OTHER): Payer: BLUE CROSS/BLUE SHIELD | Admitting: *Deleted

## 2018-03-31 ENCOUNTER — Ambulatory Visit: Payer: BLUE CROSS/BLUE SHIELD | Admitting: Adult Health

## 2018-03-31 VITALS — BP 118/78 | HR 84 | Temp 97.3°F | Ht 65.5 in | Wt 177.0 lb

## 2018-03-31 DIAGNOSIS — I441 Atrioventricular block, second degree: Secondary | ICD-10-CM | POA: Diagnosis not present

## 2018-03-31 DIAGNOSIS — L03116 Cellulitis of left lower limb: Secondary | ICD-10-CM | POA: Diagnosis not present

## 2018-03-31 DIAGNOSIS — R21 Rash and other nonspecific skin eruption: Secondary | ICD-10-CM | POA: Diagnosis not present

## 2018-03-31 MED ORDER — DOXYCYCLINE HYCLATE 100 MG PO TABS: ORAL_TABLET | ORAL | 0 refills | Status: DC

## 2018-03-31 NOTE — Progress Notes (Signed)
Remote pacemaker transmission.   

## 2018-03-31 NOTE — Patient Instructions (Addendum)
Doxycycline tablets or capsules What is this medicine? DOXYCYCLINE (dox i SYE kleen) is a tetracycline antibiotic. It kills certain bacteria or stops their growth. It is used to treat many kinds of infections, like dental, skin, respiratory, and urinary tract infections. It also treats acne, Lyme disease, malaria, and certain sexually transmitted infections. This medicine may be used for other purposes; ask your health care provider or pharmacist if you have questions. COMMON BRAND NAME(S): Acticlate, Adoxa, Adoxa CK, Adoxa Pak, Adoxa TT, Alodox, Avidoxy, Doxal, Mondoxyne NL, Monodox, Morgidox 1x, Morgidox 1x Kit, Morgidox 2x, Morgidox 2x Kit, NutriDox, Ocudox, TARGADOX, Vibra-Tabs, Vibramycin What should I tell my health care provider before I take this medicine? They need to know if you have any of these conditions: -liver disease -long exposure to sunlight like working outdoors -stomach problems like colitis -an unusual or allergic reaction to doxycycline, tetracycline antibiotics, other medicines, foods, dyes, or preservatives -pregnant or trying to get pregnant -breast-feeding How should I use this medicine? Take this medicine by mouth with a full glass of water. Follow the directions on the prescription label. It is best to take this medicine without food, but if it upsets your stomach take it with food. Take your medicine at regular intervals. Do not take your medicine more often than directed. Take all of your medicine as directed even if you think you are better. Do not skip doses or stop your medicine early. Talk to your pediatrician regarding the use of this medicine in children. While this drug may be prescribed for selected conditions, precautions do apply. Overdosage: If you think you have taken too much of this medicine contact a poison control center or emergency room at once. NOTE: This medicine is only for you. Do not share this medicine with others. What if I miss a dose? If you  miss a dose, take it as soon as you can. If it is almost time for your next dose, take only that dose. Do not take double or extra doses. What may interact with this medicine? -antacids -barbiturates -birth control pills -bismuth subsalicylate -carbamazepine -methoxyflurane -other antibiotics -phenytoin -vitamins that contain iron -warfarin This list may not describe all possible interactions. Give your health care provider a list of all the medicines, herbs, non-prescription drugs, or dietary supplements you use. Also tell them if you smoke, drink alcohol, or use illegal drugs. Some items may interact with your medicine. What should I watch for while using this medicine? Tell your doctor or health care professional if your symptoms do not improve. Do not treat diarrhea with over the counter products. Contact your doctor if you have diarrhea that lasts more than 2 days or if it is severe and watery. Do not take this medicine just before going to bed. It may not dissolve properly when you lay down and can cause pain in your throat. Drink plenty of fluids while taking this medicine to also help reduce irritation in your throat. This medicine can make you more sensitive to the sun. Keep out of the sun. If you cannot avoid being in the sun, wear protective clothing and use sunscreen. Do not use sun lamps or tanning beds/booths. Birth control pills may not work properly while you are taking this medicine. Talk to your doctor about using an extra method of birth control. If you are being treated for a sexually transmitted infection, avoid sexual contact until you have finished your treatment. Your sexual partner may also need treatment. Avoid antacids, aluminum, calcium, magnesium, and   iron products for 4 hours before and 2 hours after taking a dose of this medicine. If you are using this medicine to prevent malaria, you should still protect yourself from contact with mosquitos. Stay in screened-in  areas, use mosquito nets, keep your body covered, and use an insect repellent. What side effects may I notice from receiving this medicine? Side effects that you should report to your doctor or health care professional as soon as possible: -allergic reactions like skin rash, itching or hives, swelling of the face, lips, or tongue -difficulty breathing -fever -itching in the rectal or genital area -pain on swallowing -redness, blistering, peeling or loosening of the skin, including inside the mouth -severe stomach pain or cramps -unusual bleeding or bruising -unusually weak or tired -yellowing of the eyes or skin Side effects that usually do not require medical attention (report to your doctor or health care professional if they continue or are bothersome): -diarrhea -loss of appetite -nausea, vomiting This list may not describe all possible side effects. Call your doctor for medical advice about side effects. You may report side effects to FDA at 1-800-FDA-1088. Where should I keep my medicine? Keep out of the reach of children. Store at room temperature, below 30 degrees C (86 degrees F). Protect from light. Keep container tightly closed. Throw away any unused medicine after the expiration date. Taking this medicine after the expiration date can make you seriously ill. NOTE: This sheet is a summary. It may not cover all possible information. If you have questions about this medicine, talk to your doctor, pharmacist, or health care provider.  2018 Elsevier/Gold Standard (2015-10-26 17:11:22)  

## 2018-03-31 NOTE — Progress Notes (Signed)
Assessment and Plan:  Alyssa Dudley was seen today for insect bite.  Diagnoses and all orders for this visit:  Rash and nonspecific skin eruption/ ? Superimposed Cellulitis of left lower extremity Nonspecific, ? Atypical ringworm but appears may have superimposed cellulitis Will have her obtain OTC clotrimazole and apply BID in addition to oral abx Doxy for MRSA coverage Return if not improving, or if develops larger pustule can attempt to culture Patient to call with progress prior to the weekend, sooner if worse -     doxycycline (VIBRA-TABS) 100 MG tablet; Take 2 tabs twice a day for 2 days, then 1 tab twice a day for 10 days.  Further disposition pending results of labs. Discussed med's effects and SE's.   Over 15 minutes of exam, counseling, chart review, and critical decision making was performed.   Future Appointments  Date Time Provider [place]  05/07/2018  9:00 AM [facility] PREVISIT RM50 [facility]  05/09/2018 10:20 AM Patsey Berthold, NP [facility]  05/21/2018  9:00 AM Pyrtle, Lajuan Lines, MD [facility]  06/30/2018  7:25 AM [facility] DEVICE REMOTES [facility]  03/09/2019 10:00 AM Liane Comber, NP [facility] None    ------------------------------------------------------------------------------------------------------------------   HPI BP 118/78   Pulse 84   Temp (!) 97.3 F (36.3 C)   Ht 5' 5.5" (1.664 m)   Wt 177 lb (80.3 kg)   SpO2 98%   BMI 29.01 kg/m   62 y.o.female presents for evaluation of a lesion on her left calf; she reports she woke up with lesion to her left lateral calf while [place] ~1 month ago. She had been applying triamcinolone BID which she reports cleared up the lesion for 1 week, but then seems to have come back and is spreading ~3-4 cm; appears erythematous with scattered very small pustules at border with some central clearing. Reports mildly pruritic. She presents  photo of original lesion which appears as 1 cm round area of clustered pustules on erythematous base.    Past Medical History:  Diagnosis Date  . Hyperlipidemia   . Paroxysmal atrial fibrillation ([place]) 6/15   detected on ppm interrogation  . PONV (postoperative nausea and vomiting)    has vomitted in past  . Second degree Mobitz II AV block   . Vitamin D deficiency      No Known Allergies  Current Outpatient Medications on File Prior to Visit  Medication Sig  . aspirin EC 81 MG tablet Take 81 mg by mouth daily.  . diphenhydramine-acetaminophen (TYLENOL PM) 25-500 MG TABS tablet Take 1 tablet by mouth at bedtime as needed (pain/sleep).   . triamcinolone cream (KENALOG) 0.1 % Apply 1 application topically 3 (three) times daily as needed (rash).  . Vitamin D, Ergocalciferol, (DRISDOL) 50000 units CAPS capsule TAKE ONE CAPSULE BY MOUTH TWICE A WEEK (Patient taking differently: TAKE 1 CAPSULE (50,000 UNITS TOTALLY) BY MOUTH TWICE A WEEK)  . omeprazole (PRILOSEC) 20 MG capsule Take 1 capsule (20 mg total) by mouth 2 (two) times daily before a meal. (Patient not taking: Reported on 03/31/2018)  . ranitidine (ZANTAC) 150 MG capsule Take 1 capsule (150 mg total) by mouth 2 (two) times daily. (Patient not taking: Reported on 03/31/2018)   No current facility-administered medications on file prior to visit.     ROS: Review of Systems  Constitutional: Negative for chills, diaphoresis, fever, malaise/fatigue and weight loss.  HENT: Negative.  Negative for congestion and sore throat.  Eyes: Negative for blurred vision and double vision.  Respiratory: Negative for cough, sputum production, shortness of breath and wheezing.   Cardiovascular: Negative for chest pain, palpitations, orthopnea, claudication, leg swelling and PND.  Gastrointestinal: Negative for abdominal pain, diarrhea, nausea and vomiting.  Musculoskeletal: Negative for joint pain and myalgias.  Skin: Positive for itching and rash.   Neurological: Negative for dizziness, tingling, sensory change and headaches.     Physical Exam:  BP 118/78   Pulse 84   Temp (!) 97.3 F (36.3 C)   Ht 5' 5.5" (1.664 m)   Wt 177 lb (80.3 kg)   SpO2 98%   BMI 29.01 kg/m   General Appearance: Well nourished, in no apparent distress. Eyes: PERRLA, EOMs, conjunctiva no swelling or erythema ENT/Mouth: No erythema, swelling, or exudate on post pharynx.  Tonsils not swollen or erythematous. Hearing normal.  Neck: Supple.  Respiratory: Respiratory effort normal, BS equal bilaterally without rales, rhonchi, wheezing or stridor.  Cardio: RRR with no MRGs. Brisk peripheral pulses without edema.  Abdomen: Soft, + BS.  Non tender. Lymphatics: Non tender without lymphadenopathy.  Musculoskeletal: Full ROM, symmetrical strength, normal gait.  Skin: Warm, dry; 3-4 cm area to left lateral calf; erythematous base with a few <76mm pustules to border with ? Central clearing Neuro: Cranial nerves intact. Normal muscle tone, no cerebellar symptoms. Sensation intact.  Psych: Awake and oriented X 3, normal affect, Insight and Judgment appropriate.     Izora Ribas, NP 5:04 PM [facility]

## 2018-04-01 ENCOUNTER — Encounter: Payer: Self-pay | Admitting: Cardiology

## 2018-04-01 MED ORDER — DOXYCYCLINE HYCLATE 100 MG PO CAPS: ORAL_CAPSULE | ORAL | 0 refills | Status: DC

## 2018-04-01 NOTE — Addendum Note (Signed)
Addended by: Izora Ribas on: 04/01/2018 07:30 AM   Modules accepted: Orders

## 2018-04-04 LAB — CUP PACEART REMOTE DEVICE CHECK
Battery Remaining Longevity: 143 mo
Battery Remaining Percentage: 95.5 %
Battery Voltage: 2.98 V
Brady Statistic AP VP Percent: 40 %
Brady Statistic AP VS Percent: 5.3 %
Brady Statistic AS VP Percent: 50 %
Brady Statistic AS VS Percent: 4.5 %
Brady Statistic RA Percent Paced: 44 %
Brady Statistic RV Percent Paced: 90 %
Date Time Interrogation Session: 20190623144853
Implantable Lead Implant Date: 20140408
Implantable Lead Implant Date: 20140408
Implantable Lead Location: 753859
Implantable Lead Location: 753860
Implantable Lead Model: 1944
Implantable Lead Model: 1948
Implantable Pulse Generator Implant Date: 20140408
Lead Channel Impedance Value: 550 Ohm
Lead Channel Impedance Value: 710 Ohm
Lead Channel Pacing Threshold Amplitude: 0.5 V
Lead Channel Pacing Threshold Amplitude: 0.5 V
Lead Channel Pacing Threshold Pulse Width: 0.4 ms
Lead Channel Pacing Threshold Pulse Width: 0.4 ms
Lead Channel Sensing Intrinsic Amplitude: 12 mV
Lead Channel Sensing Intrinsic Amplitude: 4.1 mV
Lead Channel Setting Pacing Amplitude: 0.75 V
Lead Channel Setting Pacing Amplitude: 1.5 V
Lead Channel Setting Pacing Pulse Width: 0.4 ms
Lead Channel Setting Sensing Sensitivity: 2 mV
Pulse Gen Model: 2210
Pulse Gen Serial Number: 7457437

## 2018-04-14 NOTE — Progress Notes (Signed)
Assessment and Plan:  Rash and nonspecific skin eruption Unclear etiology, ? ringworm but not responding to topical therapies - will try oral antifungal while pending referral to dermatology for further evaluation -     Ambulatory referral to Dermatology -     terbinafine (LAMISIL) 250 MG tablet; Take one daily for 2-4 weeks.  Further disposition pending results of labs. Discussed med's effects and SE's.   Over 15 minutes of exam, counseling, chart review, and critical decision making was performed.   Future Appointments  Date Time Provider [place]  05/07/2018  9:00 AM [facility] PREVISIT RM50 [facility]  05/09/2018 10:20 AM Patsey Berthold, NP [facility]  05/21/2018  9:00 AM Pyrtle, Lajuan Lines, MD [facility]  06/30/2018  7:25 AM [facility] DEVICE REMOTES [facility]  03/09/2019 10:00 AM Liane Comber, NP [facility] None    ------------------------------------------------------------------------------------------------------------------   HPI BP 136/80   Pulse 67   Temp (!) 97.3 F (36.3 C)   Ht 5' 5.5" (1.664 m)   Wt 183 lb (83 kg)   SpO2 99%   BMI 29.99 kg/m   62 y.o.female presents for repeat evaluation of a lesion on her left calf; she reports she woke up with lesion to her left lateral calf while [place] in [place] 1.5 month ago. She had been applying triamcinolone BID which she reports cleared up the lesion for 1 week, but then seemed to have come back and was spreading ~3-4 cm; appeared erythematous with scattered very small pustules at border with some central clearing. Reports mildly pruritic. She presents photo of original lesion which appears as 1 cm round area of clustered pustules on erythematous base. At last visit, we had initiated a course of doxycycline and advised to try topical clotrimazole and apply BID for ? Atypical ringworm with superimposed cellulitis. She reports these therapies didn't  seem to improve symptoms; today presents with lesion spread to ~8cm area.   Past Medical History:  Diagnosis Date  . Hyperlipidemia   . Paroxysmal atrial fibrillation ([facility]) 6/15   detected on ppm interrogation  . PONV (postoperative nausea and vomiting)    has vomitted in past  . Second degree Mobitz II AV block   . Vitamin D deficiency      No Known Allergies  Current Outpatient Medications on File Prior to Visit  Medication Sig  . aspirin EC 81 MG tablet Take 81 mg by mouth daily.  . diphenhydramine-acetaminophen (TYLENOL PM) 25-500 MG TABS tablet Take 1 tablet by mouth at bedtime as needed (pain/sleep).   Marland Kitchen doxycycline (VIBRAMYCIN) 100 MG capsule Take 2 caps twice daily for 2 days, then one capsule twice daily for 10 days (Patient not taking: Reported on 04/15/2018)  . omeprazole (PRILOSEC) 20 MG capsule Take 1 capsule (20 mg total) by mouth 2 (two) times daily before a meal. (Patient not taking: Reported on 03/31/2018)  . ranitidine (ZANTAC) 150 MG capsule Take 1 capsule (150 mg total) by mouth 2 (two) times daily. (Patient not taking: Reported on 03/31/2018)  . triamcinolone cream (KENALOG) 0.1 % Apply 1 application topically 3 (three) times daily as needed (rash).  . Vitamin D, Ergocalciferol, (DRISDOL) 50000 units CAPS capsule TAKE ONE CAPSULE BY MOUTH TWICE A WEEK (Patient taking differently: TAKE 1 CAPSULE (50,000 UNITS TOTALLY) BY MOUTH TWICE A WEEK)   No current facility-administered medications on file prior to visit.     ROS: Review of Systems  Constitutional: Negative for chills, diaphoresis, fever,  malaise/fatigue and weight loss.  HENT: Negative.  Negative for congestion and sore throat.   Eyes: Negative for blurred vision and double vision.  Respiratory: Negative for cough, sputum production, shortness of breath and wheezing.   Cardiovascular: Negative for chest pain, palpitations, orthopnea, claudication, leg swelling and PND.  Gastrointestinal: Negative for abdominal  pain, diarrhea, nausea and vomiting.  Musculoskeletal: Negative for joint pain and myalgias.  Skin: Positive for itching and rash.  Neurological: Negative for dizziness, tingling, sensory change and headaches.     Physical Exam:  BP 136/80   Pulse 67   Temp (!) 97.3 F (36.3 C)   Ht 5' 5.5" (1.664 m)   Wt 183 lb (83 kg)   SpO2 99%   BMI 29.99 kg/m   General Appearance: Well nourished, in no apparent distress. Eyes: PERRLA, EOMs, conjunctiva no swelling or erythema ENT/Mouth: No erythema, swelling, or exudate on post pharynx.  Tonsils not swollen or erythematous. Hearing normal.  Neck: Supple.  Respiratory: Respiratory effort normal, BS equal bilaterally without rales, rhonchi, wheezing or stridor.  Cardio: RRR with no MRGs. Brisk peripheral pulses without edema.  Abdomen: Soft, + BS.  Non tender. Lymphatics: Non tender without lymphadenopathy.  Musculoskeletal: Full ROM, symmetrical strength, normal gait.  Skin: Warm, dry; ~8cm cm annular area to left lateral calf; erythematous base with a few <53mm pustules to border with entral clearing Neuro: Cranial nerves intact. Normal muscle tone, no cerebellar symptoms. Sensation intact.  Psych: Awake and oriented X 3, normal affect, Insight and Judgment appropriate.     Izora Ribas, NP 3:56 PM [facility]

## 2018-04-15 ENCOUNTER — Ambulatory Visit (INDEPENDENT_AMBULATORY_CARE_PROVIDER_SITE_OTHER): Payer: BLUE CROSS/BLUE SHIELD | Admitting: Adult Health

## 2018-04-15 ENCOUNTER — Encounter: Payer: Self-pay | Admitting: Adult Health

## 2018-04-15 VITALS — BP 136/80 | HR 67 | Temp 97.3°F | Ht 65.5 in | Wt 183.0 lb

## 2018-04-15 DIAGNOSIS — R21 Rash and other nonspecific skin eruption: Secondary | ICD-10-CM

## 2018-04-15 MED ORDER — TERBINAFINE HCL 250 MG PO TABS: ORAL_TABLET | ORAL | 0 refills | Status: DC

## 2018-04-15 NOTE — Patient Instructions (Signed)

## 2018-04-16 DIAGNOSIS — M722 Plantar fascial fibromatosis: Secondary | ICD-10-CM | POA: Diagnosis not present

## 2018-04-25 DIAGNOSIS — M722 Plantar fascial fibromatosis: Secondary | ICD-10-CM | POA: Diagnosis not present

## 2018-05-07 ENCOUNTER — Ambulatory Visit (AMBULATORY_SURGERY_CENTER): Payer: Self-pay | Admitting: *Deleted

## 2018-05-07 VITALS — Ht 66.0 in | Wt 180.0 lb

## 2018-05-07 DIAGNOSIS — Z86010 Personal history of colonic polyps: Secondary | ICD-10-CM

## 2018-05-07 DIAGNOSIS — Z800 Family history of malignant neoplasm of digestive organs: Secondary | ICD-10-CM

## 2018-05-07 MED ORDER — NA SULFATE-K SULFATE-MG SULF 17.5-3.13-1.6 GM/177ML PO SOLN: 1.0000 | Freq: Once | ORAL | 0 refills | Status: AC

## 2018-05-07 NOTE — Progress Notes (Signed)
No egg or soy allergy known to patient  No issues with past sedation with any surgeries  or procedures, no intubation problems  No diet pills per patient No home 02 use per patient  No blood thinners per patient  Pt denies issues with constipation  No A fib or A flutter  EMMI video sent to pt's e mail - pt declined  suprep PNM $50 coupon to pt

## 2018-05-08 NOTE — Progress Notes (Signed)
Electrophysiology Office Note Date: 05/09/2018  ID:  Alyssa, Dudley Apr 04, 1956, MRN [id number]  PCP: Unk Pinto, MD Electrophysiologist: [name]  CC: Pacemaker follow-up  Alyssa Dudley is a 62 y.o. female seen today for Dr [name].  She presents today for routine electrophysiology followup.  Since last being seen in our clinic, the patient reports doing very well.  She recently had surgery for plantar fascitis. She denies chest pain, palpitations, dyspnea, PND, orthopnea, nausea, vomiting, dizziness, syncope, edema, weight gain, or early satiety.  Device History: STJ dual chamber PPM implanted 2014 for Mobitz II   Past Medical History:  Diagnosis Date  . Allergy    once a year   . Hyperlipidemia    borderline- no meds   . Pacemaker   . Paroxysmal atrial fibrillation ([facility]) 6/15   detected on ppm interrogation  . PONV (postoperative nausea and vomiting)    has vomitted in past  . Second degree Mobitz II AV block   . Vitamin D deficiency    Past Surgical History:  Procedure Laterality Date  . BREAST CYST EXCISION  1980  . COLONOSCOPY    . PERMANENT PACEMAKER INSERTION N/A 01/13/2013   SJM Accent DR RF implanted by Dr [name] for mobitz II AV block  . PLANTAR FASCIECTOMY    . POLYPECTOMY    . WRIST SURGERY Right 2011   removed cyst    Current Outpatient Medications  Medication Sig Dispense Refill  . aspirin EC 81 MG tablet Take 81 mg by mouth daily.    . diphenhydramine-acetaminophen (TYLENOL PM) 25-500 MG TABS tablet Take 1 tablet by mouth at bedtime as needed (pain/sleep).     . triamcinolone cream (KENALOG) 0.1 % Apply 1 application topically 3 (three) times daily as needed (rash).    . Vitamin D, Ergocalciferol, (DRISDOL) 50000 units CAPS capsule TAKE ONE CAPSULE BY MOUTH TWICE A WEEK (Patient taking differently: TAKE 1 CAPSULE (50,000 UNITS TOTALLY) BY MOUTH TWICE A WEEK) 60 capsule 0   No current facility-administered medications for this visit.      Allergies:   Patient has no known allergies.   Social History: Social History   Socioeconomic History  . Marital status: Single    Spouse name: Not on file  . Number of children: 1  . Years of education: Not on file  . Highest education level: Not on file  Occupational History  . Not on file  Social Needs  . Financial resource strain: Not on file  . Food insecurity:    Worry: Not on file    Inability: Not on file  . Transportation needs:    Medical: Not on file    Non-medical: Not on file  Tobacco Use  . Smoking status: Never Smoker  . Smokeless tobacco: Never Used  Substance and Sexual Activity  . Alcohol use: No  . Drug use: No  . Sexual activity: Not Currently  Lifestyle  . Physical activity:    Days per week: 4 days    Minutes per session: 40 min  . Stress: To some extent  Relationships  . Social connections:    Talks on phone: Not on file    Gets together: Not on file    Attends religious service: Not on file    Active member of club or organization: Not on file    Attends meetings of clubs or organizations: Not on file    Relationship status: Not on file  . Intimate partner violence:    Fear  of current or ex partner: Not on file    Emotionally abused: Not on file    Physically abused: Not on file    Forced sexual activity: Not on file  Other Topics Concern  . Not on file  Social History Narrative  . Not on file    Family History: Family History  Problem Relation Age of Onset  . Colon cancer Sister 34  . Anemia Sister   . Heart disease Father   . Hyperlipidemia Father   . Prostate cancer Brother 25  . Multiple myeloma Son 26  . Hypertension Son   . Kidney disease Son   . Multiple myeloma Maternal Aunt   . Cervical cancer Mother   . Hypertension Mother   . Diabetes Mother   . HIV/AIDS Brother   . Colon polyps Neg Hx   . Rectal cancer Neg Hx   . Stomach cancer Neg Hx      Review of Systems: All other systems reviewed and are  otherwise negative except as noted above.   Physical Exam: VS:  BP 140/70 (BP Location: Left Arm, Patient Position: Sitting, Cuff Size: Normal)   Pulse 67   Ht _0  (1.676 m)   Wt 181 lb 12.8 oz (82.5 kg)   SpO2 97% Comment: at rest  BMI 29.34 kg/m  , BMI Body mass index is 29.34 kg/m.  GEN- The patient is well appearing, alert and oriented x 3 today.   HEENT: normocephalic, atraumatic; sclera clear, conjunctiva pink; hearing intact; oropharynx clear; neck supple  Lungs- Clear to ausculation bilaterally, normal work of breathing.  No wheezes, rales, rhonchi Heart- Regular rate and rhythm (paced) GI- soft, non-tender, non-distended, bowel sounds present  Extremities- no clubbing, cyanosis, or edema  MS- no significant deformity or atrophy Skin- warm and dry, no rash or lesion; PPM pocket well healed Psych- euthymic mood, full affect Neuro- strength and sensation are intact  PPM Interrogation- reviewed in detail today,  See PACEART report  EKG:  EKG is not ordered today.  Recent Labs: 03/04/2018: ALT 16; Magnesium 2.2; TSH 2.53 03/30/2018: BUN 9; Creatinine, Ser 0.85; Hemoglobin 14.1; Platelets 231; Potassium 3.9; Sodium 141   Wt Readings from Last 3 Encounters:  05/09/18 181 lb 12.8 oz (82.5 kg)  05/07/18 180 lb (81.6 kg)  04/15/18 183 lb (83 kg)     Other studies Reviewed: Additional studies/ records that were reviewed today include: Dr Jackalyn Lombard office notes  Assessment and Plan:  1.  Mobitz II Normal PPM function See Pace Art report No changes today  2.  Paroxysmal atrial fibrillation Burden by device interrogation <1%, longest episode 7 minutes  CHADS2VASC is 1 (female)   Current medicines are reviewed at length with the patient today.   The patient does not have concerns regarding her medicines.  The following changes were made today:  none  Labs/ tests ordered today include: none Orders Placed This Encounter  Procedures  . CUP PACEART INCLINIC DEVICE  CHECK     Disposition:   Follow up with merlin, me in 1 year     Signed, Chanetta Marshall, NP 05/09/2018 10:33 AM  [facility] [place] [address] [phone] (office) [phone] (fax)

## 2018-05-09 ENCOUNTER — Encounter

## 2018-05-09 ENCOUNTER — Ambulatory Visit: Payer: BLUE CROSS/BLUE SHIELD | Admitting: Nurse Practitioner

## 2018-05-09 VITALS — BP 140/70 | HR 67 | Ht 66.0 in | Wt 181.8 lb

## 2018-05-09 DIAGNOSIS — I441 Atrioventricular block, second degree: Secondary | ICD-10-CM | POA: Diagnosis not present

## 2018-05-09 DIAGNOSIS — I480 Paroxysmal atrial fibrillation: Secondary | ICD-10-CM | POA: Diagnosis not present

## 2018-05-09 LAB — CUP PACEART INCLINIC DEVICE CHECK
Date Time Interrogation Session: 20190802103317
Implantable Lead Implant Date: 20140408
Implantable Lead Implant Date: 20140408
Implantable Lead Location: 753859
Implantable Lead Location: 753860
Implantable Lead Model: 1944
Implantable Lead Model: 1948
Implantable Pulse Generator Implant Date: 20140408
Pulse Gen Model: 2210
Pulse Gen Serial Number: 7457437

## 2018-05-09 NOTE — Patient Instructions (Addendum)
Medication Instructions:   Your physician recommends that you continue on your current medications as directed. Please refer to the Current Medication list given to you today.   If you need a refill on your cardiac medications before your next appointment, please call your pharmacy.  Labwork: NONE ORDERED  TODAY     Testing/Procedures: NONE ORDERED  TODAY     Follow-Up: Your physician wants you to follow-up in: [place] will receive a reminder letter in the mail two months in advance. If you don't receive a letter, please call our office to schedule the follow-up appointment.   Remote monitoring is used to monitor your Pacemaker of ICD from home. This monitoring reduces the number of office visits required to check your device to one time per year. It allows [place] to keep an eye on the functioning of your device to ensure it is working properly. You are scheduled for a device check from home on .06-30-18  You may send your transmission at any time that day. If you have a wireless device, the transmission will be sent automatically. After your physician reviews your transmission, you will receive a postcard with your next transmission date.     Any Other Special Instructions Will Be Listed Below (If Applicable).

## 2018-05-14 ENCOUNTER — Encounter: Payer: Self-pay | Admitting: Internal Medicine

## 2018-05-21 ENCOUNTER — Ambulatory Visit (AMBULATORY_SURGERY_CENTER): Payer: BLUE CROSS/BLUE SHIELD | Admitting: Internal Medicine

## 2018-05-21 ENCOUNTER — Encounter: Payer: Self-pay | Admitting: Internal Medicine

## 2018-05-21 VITALS — BP 124/75 | HR 60 | Temp 96.2°F | Resp 20 | Ht 66.0 in | Wt 181.0 lb

## 2018-05-21 DIAGNOSIS — Z86010 Personal history of colonic polyps: Secondary | ICD-10-CM

## 2018-05-21 DIAGNOSIS — D122 Benign neoplasm of ascending colon: Secondary | ICD-10-CM | POA: Diagnosis not present

## 2018-05-21 DIAGNOSIS — D124 Benign neoplasm of descending colon: Secondary | ICD-10-CM | POA: Diagnosis not present

## 2018-05-21 DIAGNOSIS — Z1211 Encounter for screening for malignant neoplasm of colon: Secondary | ICD-10-CM | POA: Diagnosis not present

## 2018-05-21 DIAGNOSIS — D123 Benign neoplasm of transverse colon: Secondary | ICD-10-CM

## 2018-05-21 MED ORDER — SODIUM CHLORIDE 0.9 % IV SOLN: 500.0000 mL | Freq: Once | INTRAVENOUS | Status: DC

## 2018-05-21 NOTE — Progress Notes (Signed)
Called to room to assist during endoscopic procedure.  Patient ID and intended procedure confirmed with present staff. Received instructions for my participation in the procedure from the performing physician.  

## 2018-05-21 NOTE — Progress Notes (Signed)
A and O x3. Report to RN. Tolerated MAC anesthesia well.

## 2018-05-21 NOTE — Patient Instructions (Signed)
Please read handouts on Polyps and Hemorrhoids. Continue present medications.    YOU HAD AN ENDOSCOPIC PROCEDURE TODAY AT [facility]:   Refer to the procedure report that was given to you for any specific questions about what was found during the examination.  If the procedure report does not answer your questions, please call your gastroenterologist to clarify.  If you requested that your care partner not be given the details of your procedure findings, then the procedure report has been included in a sealed envelope for you to review at your convenience later.  YOU SHOULD EXPECT: Some feelings of bloating in the abdomen. Passage of more gas than usual.  Walking can help get rid of the air that was put into your GI tract during the procedure and reduce the bloating. If you had a lower endoscopy (such as a colonoscopy or flexible sigmoidoscopy) you may notice spotting of blood in your stool or on the toilet paper. If you underwent a bowel prep for your procedure, you may not have a normal bowel movement for a few days.  Please Note:  You might notice some irritation and congestion in your nose or some drainage.  This is from the oxygen used during your procedure.  There is no need for concern and it should clear up in a day or so.  SYMPTOMS TO REPORT IMMEDIATELY:   Following lower endoscopy (colonoscopy or flexible sigmoidoscopy):  Excessive amounts of blood in the stool  Significant tenderness or worsening of abdominal pains  Swelling of the abdomen that is new, acute  Fever of 100F or higher    For urgent or emergent issues, a gastroenterologist can be reached at any hour by calling [phone].   DIET:  We do recommend a small meal at first, but then you may proceed to your regular diet.  Drink plenty of fluids but you should avoid alcoholic beverages for 24 hours.  ACTIVITY:  You should plan to take it easy for the rest of today and you should NOT DRIVE or use  heavy machinery until tomorrow (because of the sedation medicines used during the test).    FOLLOW UP: Our staff will call the number listed on your records the next business day following your procedure to check on you and address any questions or concerns that you may have regarding the information given to you following your procedure. If we do not reach you, we will leave a message.  However, if you are feeling well and you are not experiencing any problems, there is no need to return our call.  We will assume that you have returned to your regular daily activities without incident.  If any biopsies were taken you will be contacted by phone or by letter within the next 1-3 weeks.  Please call us at [phone] if you have not heard about the biopsies in 3 weeks.    SIGNATURES/CONFIDENTIALITY: You and/or your care partner have signed paperwork which will be entered into your electronic medical record.  These signatures attest to the fact that that the information above on your After Visit Summary has been reviewed and is understood.  Full responsibility of the confidentiality of this discharge information lies with you and/or your care-partner.

## 2018-05-21 NOTE — Progress Notes (Signed)
Pt's states no medical or surgical changes since previsit or office visit. 

## 2018-05-21 NOTE — Op Note (Signed)
[facility] Patient Name: Alyssa Dudley Procedure Date: 05/21/2018 9:05 AM MRN: [id number] Endoscopist: Jerene Bears , MD Age: 62 Referring MD:  Date of Birth: 01-Mar-1956 Gender: Female Account #: [id number] Procedure:                Colonoscopy Indications:              High risk colon cancer surveillance: Personal                            history of non-advanced adenoma, Family history of                            colon cancer in a first-degree relative, Last                            colonoscopy: January 2013 Medicines:                Monitored Anesthesia Care Procedure:                Pre-Anesthesia Assessment:                           - Prior to the procedure, a History and Physical                            was performed, and patient medications and                            allergies were reviewed. The patient's tolerance of                            previous anesthesia was also reviewed. The risks                            and benefits of the procedure and the sedation                            options and risks were discussed with the patient.                            All questions were answered, and informed consent                            was obtained. Prior Anticoagulants: The patient has                            taken no previous anticoagulant or antiplatelet                            agents. ASA Grade Assessment: II - A patient with                            mild systemic disease. After reviewing the risks  and benefits, the patient was deemed in                            satisfactory condition to undergo the procedure.                           After obtaining informed consent, the colonoscope                            was passed under direct vision. Throughout the                            procedure, the patient's blood pressure, pulse, and                            oxygen saturations were monitored  continuously. The                            Colonoscope was introduced through the anus and                            advanced to the cecum, identified by appendiceal                            orifice and ileocecal valve. The colonoscopy was                            performed without difficulty. The patient tolerated                            the procedure well. The quality of the bowel                            preparation was good. The ileocecal valve,                            appendiceal orifice, and rectum were photographed. Scope In: 9:14:20 AM Scope Out: 9:30:13 AM Scope Withdrawal Time: 0 hours 13 minutes 24 seconds  Total Procedure Duration: 0 hours 15 minutes 53 seconds  Findings:                 The digital rectal exam was normal.                           A 6 mm polyp was found in the ascending colon. The                            polyp was sessile. The polyp was removed with a                            cold snare. Resection and retrieval were complete.                           A 5 mm polyp was found  in the hepatic flexure. The                            polyp was sessile. The polyp was removed with a                            cold snare. Resection and retrieval were complete.                           Four sessile polyps were found in the transverse                            colon. The polyps were 2 to 4 mm in size. These                            polyps were removed with a cold snare. Resection                            and retrieval were complete.                           Internal hemorrhoids were found during                            retroflexion. The hemorrhoids were small. Complications:            No immediate complications. Estimated Blood Loss:     Estimated blood loss was minimal. Impression:               - One 6 mm polyp in the ascending colon, removed                            with a cold snare. Resected and retrieved.                            - One 5 mm polyp at the hepatic flexure, removed                            with a cold snare. Resected and retrieved.                           - Four 2 to 4 mm polyps in the transverse colon,                            removed with a cold snare. Resected and retrieved.                           - Small internal hemorrhoids. Recommendation:           - Patient has a contact number available for                            emergencies. The signs and symptoms of potential  delayed complications were discussed with the                            patient. Return to normal activities tomorrow.                            Written discharge instructions were provided to the                            patient.                           - Resume previous diet.                           - Continue present medications.                           - Await pathology results.                           - Repeat colonoscopy is recommended for                            surveillance. The colonoscopy date will be                            determined after pathology results from today's                            exam become available for review. Jerene Bears, MD 05/21/2018 9:33:23 AM This report has been signed electronically.

## 2018-05-22 ENCOUNTER — Telehealth: Payer: Self-pay

## 2018-05-22 NOTE — Telephone Encounter (Signed)
  Follow up Call-  Call back number 05/21/2018  Post procedure Call Back phone  # [phone]  Permission to leave phone message Yes  Some recent data might be hidden     Patient questions:  Do you have a fever, pain , or abdominal swelling? No. Pain Score  0 *  Have you tolerated food without any problems? Yes.    Have you been able to return to your normal activities? Yes.    Currently at [facility].  Do you have any questions about your discharge instructions: Diet   No. Medications  No. Follow up visit  No.  Do you have questions or concerns about your Care? No.  Actions: * If pain score is 4 or above: No action needed, pain <4.

## 2018-05-22 NOTE — Telephone Encounter (Signed)
  Follow up Call-  Call back number 05/21/2018  Post procedure Call Back phone  # [phone]  Permission to leave phone message Yes  Some recent data might be hidden     No ID on answering machine.  No message left. Emmeline Winebarger/Call-back [facility]

## 2018-05-27 ENCOUNTER — Encounter: Payer: Self-pay | Admitting: Internal Medicine

## 2018-06-20 ENCOUNTER — Other Ambulatory Visit: Payer: Self-pay | Admitting: Physician Assistant

## 2018-06-30 ENCOUNTER — Encounter: Payer: BLUE CROSS/BLUE SHIELD | Admitting: *Deleted

## 2018-07-01 ENCOUNTER — Encounter: Payer: Self-pay | Admitting: Cardiology

## 2018-08-08 DIAGNOSIS — Z1231 Encounter for screening mammogram for malignant neoplasm of breast: Secondary | ICD-10-CM | POA: Diagnosis not present

## 2018-08-08 LAB — HM MAMMOGRAPHY

## 2018-08-11 ENCOUNTER — Ambulatory Visit (INDEPENDENT_AMBULATORY_CARE_PROVIDER_SITE_OTHER): Payer: BLUE CROSS/BLUE SHIELD | Admitting: *Deleted

## 2018-08-11 VITALS — Temp 97.9°F

## 2018-08-11 DIAGNOSIS — Z23 Encounter for immunization: Secondary | ICD-10-CM | POA: Diagnosis not present

## 2018-08-11 NOTE — Patient Instructions (Signed)
Patient here for her flu vaccine.  Her temp was 97.9.  Injection was given in her left detoid.

## 2018-09-09 NOTE — Progress Notes (Signed)
FOLLOW UP  Assessment and Plan: Essential hypertension - continue medications, DASH diet, exercise and monitor at home. Call if greater than 130/80. - CBC with Differential/Platelet - BASIC METABOLIC PANEL WITH GFR - TSH - Hepatic function panel   Hyperlipidemia -check lipids, decrease fatty foods, increase activity.  ADD on fiber, no meds at this time, wants to try diet - Lipid panel - given info about benefits of statins  Obesity Obesity with co morbidities- long discussion about weight loss, diet, and exercise  Mobitz type 2 second degree and high-grade heart block Has pacemaker   Paroxysmal atrial fibrillation Not on coagulation, following with Cardio   Discussed med's effects and SE's. Screening labs and tests as requested with regular follow-up as recommended. Future Appointments  Date Time Provider [place]  03/09/2019 10:00 AM Liane Comber, NP [facility] None     HPI  62 y.o. female  presents for 6 month follow up for HTN, chol, preDM, vitamin D def.   Her blood pressure has been controlled at home, today their BP is BP: 130/80 She does workout, walking and running, doing 5K this Saturday. She denies chest pain, shortness of breath, dizziness.  She has Afib and mobitz type 2, follows with Dr. [name] and Leonides Schanz, low risk CHADSVASC 1 and not on anticoagulation.  She is not on cholesterol medication, was suppose to be on crestor but she is not one it.  Her cholesterol is not at goal. The cholesterol last visit was:   Lab Results  Component Value Date   CHOL 231 (H) 03/04/2018   HDL 65 03/04/2018   LDLCALC 151 (H) 03/04/2018   TRIG 58 03/04/2018   CHOLHDL 3.6 03/04/2018    She has been working on diet and exercise for prediabetes and has done a good job of decreasing her A1C, and denies paresthesia of the feet, polydipsia, polyuria and visual disturbances. Last A1C in the office was:  Lab Results  Component Value Date   HGBA1C 5.5 03/04/2018  Patient  is on Vitamin D supplement, 50,000 IU only once a week.   Lab Results  Component Value Date   VD25OH 53 03/04/2018    BMI is Body mass index is 30.15 kg/m., she is working on diet and exercise. Wt Readings from Last 3 Encounters:  09/10/18 186 lb 12.8 oz (84.7 kg)  05/21/18 181 lb (82.1 kg)  05/09/18 181 lb 12.8 oz (82.5 kg)     Current Medications:  Current Outpatient Medications on File Prior to Visit  Medication Sig Dispense Refill  . aspirin EC 81 MG tablet Take 81 mg by mouth daily.    . diphenhydramine-acetaminophen (TYLENOL PM) 25-500 MG TABS tablet Take 1 tablet by mouth at bedtime as needed (pain/sleep).     . triamcinolone cream (KENALOG) 0.1 % Apply 1 application topically 3 (three) times daily as needed (rash).    . Vitamin D, Ergocalciferol, (DRISDOL) 50000 units CAPS capsule TAKE ONE CAPSULE BY MOUTH TWICE A WEEK 60 capsule 0   No current facility-administered medications on file prior to visit.    Patient Active Problem List   Diagnosis Date Noted  . Meralgia paraesthetica, left 02/13/2018  . Plantar fasciitis of left foot 02/13/2018  . Lateral epicondylitis of left elbow 02/13/2018  . Overweight (BMI 25.0-29.9) 02/25/2015  . HTN (hypertension) 08/17/2014  . Abnormal glucose 08/17/2014  . Medication management 08/17/2014  . Atrial fibrillation ([place]) 06/07/2014  . Hyperlipidemia   . Vitamin D deficiency   . Mobitz type 2 second  degree and high-grade heart block 01/07/2013  . Family history of malignant neoplasm of gastrointestinal tract 10/15/2011  . Benign neoplasm of colon 10/15/2011  . GERD 01/10/2008  . Constipation 01/10/2008  . History of peptic ulcer disease 01/10/2008     Allergies: No Known Allergies   Review of Systems: Review of Systems  Constitutional: Negative.   HENT: Negative.   Eyes: Negative.   Respiratory: Negative.   Cardiovascular: Negative.   Gastrointestinal: Negative.   Genitourinary: Negative.   Musculoskeletal: Negative.    Skin: Negative.   Neurological: Negative.   Endo/Heme/Allergies: Negative.   Psychiatric/Behavioral: Negative for depression, hallucinations, memory loss, substance abuse and suicidal ideas. The patient is not nervous/anxious and does not have insomnia.    Physical Exam: Estimated body mass index is 30.15 kg/m as calculated from the following:   Height as of this encounter: 5\' 6"  (1.676 m).   Weight as of this encounter: 186 lb 12.8 oz (84.7 kg). BP 130/80   Ht 5\' 6"  (1.676 m)   Wt 186 lb 12.8 oz (84.7 kg)   BMI 30.15 kg/m  General Appearance: Well nourished, in no apparent distress.  Eyes: PERRLA, EOMs, conjunctiva no swelling or erythema, normal fundi and vessels.  Sinuses: No Frontal/maxillary tenderness  ENT/Mouth: Ext aud canals clear, normal light reflex with TMs without erythema, bulging. Good dentition. No erythema, swelling, or exudate on post pharynx. Tonsils not swollen or erythematous. Hearing normal.  Neck: Supple, thyroid normal. No bruits  Respiratory: Respiratory effort normal, BS equal bilaterally without rales, rhonchi, wheezing or stridor.  Cardio: RRR without murmurs, rubs or gallops. Brisk peripheral pulses without edema.  Chest: symmetric, with normal excursions and percussion. .  Abdomen: Soft, nontender, no guarding, rebound, hernias, masses, or organomegaly.  Lymphatics: Non tender without lymphadenopathy.  Musculoskeletal: Full ROM all peripheral extremities,5/5 strength, and normal gait, negative straight leg. .  Skin: Warm, dry without rashes, lesions, ecchymosis. Neuro: Cranial nerves intact, reflexes equal bilaterally. Normal muscle tone, no cerebellar symptoms. Sensation intact.  Psych: Awake and oriented X 3, normal affect, Insight and Judgment appropriate.    Vicie Mutters 11:20 AM [facility]

## 2018-09-10 ENCOUNTER — Ambulatory Visit: Payer: BLUE CROSS/BLUE SHIELD | Admitting: Physician Assistant

## 2018-09-10 ENCOUNTER — Encounter: Payer: Self-pay | Admitting: Physician Assistant

## 2018-09-10 VITALS — BP 130/80 | Ht 66.0 in | Wt 186.8 lb

## 2018-09-10 DIAGNOSIS — I441 Atrioventricular block, second degree: Secondary | ICD-10-CM | POA: Diagnosis not present

## 2018-09-10 DIAGNOSIS — E663 Overweight: Secondary | ICD-10-CM

## 2018-09-10 DIAGNOSIS — I480 Paroxysmal atrial fibrillation: Secondary | ICD-10-CM

## 2018-09-10 DIAGNOSIS — R7309 Other abnormal glucose: Secondary | ICD-10-CM

## 2018-09-10 DIAGNOSIS — E7800 Pure hypercholesterolemia, unspecified: Secondary | ICD-10-CM | POA: Diagnosis not present

## 2018-09-10 DIAGNOSIS — Z79899 Other long term (current) drug therapy: Secondary | ICD-10-CM

## 2018-09-10 DIAGNOSIS — I10 Essential (primary) hypertension: Secondary | ICD-10-CM | POA: Diagnosis not present

## 2018-09-10 NOTE — Patient Instructions (Addendum)
Your LDL could improve, ideally we want it under a 100.  Your LDL is the bad cholesterol that can lead to heart attack and stroke. To lower your number you can decrease your fatty foods, red meat, cheese, milk and increase fiber like whole grains and veggies. You can also add a fiber supplement like Citracel or Benefiber, these do not cause gas and bloating and are safe to use. Especially if you have a strong family history of heart disease or stroke or you have evidence of plaque on any imaging like a chest xray, we may discuss at your next office visit putting you on a medication to get your number below 100.   Statin therapy  In addition to the known lipid-lowering effects, statins are now widely accepted to have anti-inflammatory and immunomodulatory effects. Adjunctive use of statins has proven beneficial in the context of a wide range of inflammatory diseases, including rheumatoid arthritis.  Research has shown that the salutary effect of statins on cholesterol may not be their only benefit. Statin therapy has shown promise for everything from fighting viral infections to protecting the eye from cataracts.  A 2005 study of more than 700 hospital patients being treated for pneumonia found that the death rate was more than twice as high among those who were not using statins. In 2006, a [place] study examined the rate of sepsis, a deadly blood infection, among patients who had been hospitalized for heart events. In the two years after their hospitalization, the statin users had a rate of sepsis 19% lower than that of the non-statin users. A 2009 review of 22 studies found that statins appeared to have a beneficial effect on the outcome of infection, but they couldn't come to a firm conclusion.

## 2018-09-11 LAB — CBC WITH DIFFERENTIAL/PLATELET
Basophils Absolute: 19 cells/uL (ref 0–200)
Basophils Relative: 0.4 %
Eosinophils Absolute: 72 cells/uL (ref 15–500)
Eosinophils Relative: 1.5 %
HCT: 41.6 % (ref 35.0–45.0)
Hemoglobin: 13.9 g/dL (ref 11.7–15.5)
Lymphs Abs: 2275 cells/uL (ref 850–3900)
MCH: 29.7 pg (ref 27.0–33.0)
MCHC: 33.4 g/dL (ref 32.0–36.0)
MCV: 88.9 fL (ref 80.0–100.0)
MPV: 10.2 fL (ref 7.5–12.5)
Monocytes Relative: 10.2 %
Neutro Abs: 1944 cells/uL (ref 1500–7800)
Neutrophils Relative %: 40.5 %
Platelets: 241 10*3/uL (ref 140–400)
RBC: 4.68 10*6/uL (ref 3.80–5.10)
RDW: 12.9 % (ref 11.0–15.0)
Total Lymphocyte: 47.4 %
WBC mixed population: 490 cells/uL (ref 200–950)
WBC: 4.8 10*3/uL (ref 3.8–10.8)

## 2018-09-11 LAB — LIPID PANEL
Cholesterol: 200 mg/dL — ABNORMAL HIGH (ref <200)
HDL: 63 mg/dL (ref >50)
LDL Cholesterol (Calc): 122 mg/dL (calc) — ABNORMAL HIGH
Non-HDL Cholesterol (Calc): 137 mg/dL (calc) — ABNORMAL HIGH (ref <130)
Total CHOL/HDL Ratio: 3.2 (calc) (ref <5.0)
Triglycerides: 60 mg/dL (ref <150)

## 2018-09-11 LAB — COMPLETE METABOLIC PANEL WITH GFR
AG Ratio: 1.4 (calc) (ref 1.0–2.5)
ALT: 17 U/L (ref 6–29)
AST: 17 U/L (ref 10–35)
Albumin: 4.4 g/dL (ref 3.6–5.1)
Alkaline phosphatase (APISO): 114 U/L (ref 33–130)
BUN: 11 mg/dL (ref 7–25)
CO2: 27 mmol/L (ref 20–32)
Calcium: 9.9 mg/dL (ref 8.6–10.4)
Chloride: 106 mmol/L (ref 98–110)
Creat: 0.69 mg/dL (ref 0.50–0.99)
GFR, Est African American: 109 mL/min/{1.73_m2} (ref >=60)
GFR, Est Non African American: 94 mL/min/{1.73_m2} (ref >=60)
Globulin: 3.1 g/dL (calc) (ref 1.9–3.7)
Glucose, Bld: 79 mg/dL (ref 65–99)
Potassium: 4.9 mmol/L (ref 3.5–5.3)
Sodium: 144 mmol/L (ref 135–146)
Total Bilirubin: 0.7 mg/dL (ref 0.2–1.2)
Total Protein: 7.5 g/dL (ref 6.1–8.1)

## 2018-11-17 ENCOUNTER — Ambulatory Visit (INDEPENDENT_AMBULATORY_CARE_PROVIDER_SITE_OTHER): Payer: BLUE CROSS/BLUE SHIELD | Admitting: Physician Assistant

## 2018-11-17 ENCOUNTER — Encounter: Payer: Self-pay | Admitting: Physician Assistant

## 2018-11-17 VITALS — BP 136/98 | HR 61 | Temp 97.3°F | Ht 66.0 in | Wt 184.2 lb

## 2018-11-17 DIAGNOSIS — I10 Essential (primary) hypertension: Secondary | ICD-10-CM | POA: Diagnosis not present

## 2018-11-17 DIAGNOSIS — R51 Headache: Secondary | ICD-10-CM | POA: Diagnosis not present

## 2018-11-17 DIAGNOSIS — R519 Headache, unspecified: Secondary | ICD-10-CM

## 2018-11-17 MED ORDER — CYCLOBENZAPRINE HCL 10 MG PO TABS: 10.0000 mg | ORAL_TABLET | Freq: Every evening | ORAL | 0 refills | Status: DC | PRN

## 2018-11-17 MED ORDER — ATENOLOL 25 MG PO TABS: 25.0000 mg | ORAL_TABLET | Freq: Every day | ORAL | 1 refills | Status: DC

## 2018-11-17 NOTE — Patient Instructions (Signed)
Will give you flexeril you can take at night Take the atenolol 25mg  for blood pressure at night as well  patient to go to ER if there is weakness, thunderclap headache, visual changes, or any concerning factors  What is the TMJ? The temporomandibular (tem-PUH-ro-man-DIB-yoo-ler) joint, or the TMJ, connects the upper and lower jawbones. This joint allows the jaw to open wide and move back and forth when you chew, talk, or yawn.There are also several muscles that help this joint move. There can be muscle tightness and pain in the muscle that can cause several symptoms.  What causes TMJ pain? There are many causes of TMJ pain. Repeated chewing (for example, chewing gum) and clenching your teeth can cause pain in the joint. Some TMJ pain has no obvious cause. What can I do to ease the pain? There are many things you can do to help your pain get better. When you have pain:  Eat soft foods and stay away from chewy foods (for example, taffy) Try to use both sides of your mouth to chew Don't chew gum Massage Don't open your mouth wide (for example, during yawning or singing) Don't bite your cheeks or fingernails Lower your amount of stress and worry Applying a warm, damp washcloth to the joint may help. Over-the-counter pain medicines such as ibuprofen (one brand: Advil) or acetaminophen (one brand: Tylenol) might also help. Do not use these medicines if you are allergic to them or if your doctor told you not to use them. How can I stop the pain from coming back? When your pain is better, you can do these exercises to make your muscles stronger and to keep the pain from coming back:  Resisted mouth opening: Place your thumb or two fingers under your chin and open your mouth slowly, pushing up lightly on your chin with your thumb. Hold for three to six seconds. Close your mouth slowly. Resisted mouth closing: Place your thumbs under your chin and your two index fingers on the ridge between your mouth  and the bottom of your chin. Push down lightly on your chin as you close your mouth. Tongue up: Slowly open and close your mouth while keeping the tongue touching the roof of the mouth. Side-to-side jaw movement: Place an object about one fourth of an inch thick (for example, two tongue depressors) between your front teeth. Slowly move your jaw from side to side. Increase the thickness of the object as the exercise becomes easier Forward jaw movement: Place an object about one fourth of an inch thick between your front teeth and move the bottom jaw forward so that the bottom teeth are in front of the top teeth. Increase the thickness of the object as the exercise becomes easier. These exercises should not be painful. If it hurts to do these exercises, stop doing them and talk to your family doctor.   HYPERTENSION INFORMATION  Monitor your blood pressure at home, please keep a record and bring that in with you to your next office visit.   Go to the ER if any CP, SOB, nausea, dizziness, severe HA, changes vision/speech  Due to a recent study, SPRINT, we have changed our goal for the systolic or top blood pressure number. Ideally we want your top number at 120.  In the [facility] Trial, 5000 people were randomized to a goal BP of 120 and 5000 people were randomized to a goal BP of less than 140. The patients with the goal BP at 120 had LESS  DEMENTIA, LESS HEART ATTACKS, AND LESS STROKES, AS WELL AS OVERALL DECREASED MORTALITY OR DEATH RATE.   If you are willing, our goal BP is the top number of 120.  Your most recent BP: BP: (!) 136/98   Take your medications faithfully as instructed. Maintain a healthy weight. Get at least 150 minutes of aerobic exercise per week. Minimize salt intake. Minimize alcohol intake  DASH Eating Plan DASH stands for "Dietary Approaches to Stop Hypertension." The DASH eating plan is a healthy eating plan that has been shown to reduce high blood pressure (hypertension).  Additional health benefits may include reducing the risk of type 2 diabetes mellitus, heart disease, and stroke. The DASH eating plan may also help with weight loss. WHAT DO I NEED TO KNOW ABOUT THE DASH EATING PLAN? For the DASH eating plan, you will follow these general guidelines:  Choose foods with a percent daily value for sodium of less than 5% (as listed on the food label).  Use salt-free seasonings or herbs instead of table salt or sea salt.  Check with your health care provider or pharmacist before using salt substitutes.  Eat lower-sodium products, often labeled as "lower sodium" or "no salt added."  Eat fresh foods.  Eat more vegetables, fruits, and low-fat dairy products.  Choose whole grains. Look for the word "whole" as the first word in the ingredient list.  Choose fish and skinless chicken or [place] more often than red meat. Limit fish, poultry, and meat to 6 oz (170 g) each day.  Limit sweets, desserts, sugars, and sugary drinks.  Choose heart-healthy fats.  Limit cheese to 1 oz (28 g) per day.  Eat more home-cooked food and less restaurant, buffet, and fast food.  Limit fried foods.  Cook foods using methods other than frying.  Limit canned vegetables. If you do use them, rinse them well to decrease the sodium.  When eating at a restaurant, ask that your food be prepared with less salt, or no salt if possible. WHAT FOODS CAN I EAT? Seek help from a dietitian for individual calorie needs. Grains Whole grain or whole wheat bread. Brown rice. Whole grain or whole wheat pasta. Quinoa, bulgur, and whole grain cereals. Low-sodium cereals. Corn or whole wheat flour tortillas. Whole grain cornbread. Whole grain crackers. Low-sodium crackers. Vegetables Fresh or frozen vegetables (raw, steamed, roasted, or grilled). Low-sodium or reduced-sodium tomato and vegetable juices. Low-sodium or reduced-sodium tomato sauce and paste. Low-sodium or reduced-sodium canned  vegetables.  Fruits All fresh, canned (in natural juice), or frozen fruits. Meat and Other Protein Products Ground beef (85% or leaner), grass-fed beef, or beef trimmed of fat. Skinless chicken or [place]. Ground chicken or [place]. Pork trimmed of fat. All fish and seafood. Eggs. Dried beans, peas, or lentils. Unsalted nuts and seeds. Unsalted canned beans. Dairy Low-fat dairy products, such as skim or 1% milk, 2% or reduced-fat cheeses, low-fat ricotta or cottage cheese, or plain low-fat yogurt. Low-sodium or reduced-sodium cheeses. Fats and Oils Tub margarines without trans fats. Light or reduced-fat mayonnaise and salad dressings (reduced sodium). Avocado. Safflower, olive, or canola oils. Natural peanut or almond butter. Other Unsalted popcorn and pretzels. The items listed above may not be a complete list of recommended foods or beverages. Contact your dietitian for more options. WHAT FOODS ARE NOT RECOMMENDED? Grains White bread. White pasta. White rice. Refined cornbread. Bagels and croissants. Crackers that contain trans fat. Vegetables Creamed or fried vegetables. Vegetables in a cheese sauce. Regular canned vegetables. Regular canned  tomato sauce and paste. Regular tomato and vegetable juices. Fruits Dried fruits. Canned fruit in light or heavy syrup. Fruit juice. Meat and Other Protein Products Fatty cuts of meat. Ribs, chicken wings, bacon, sausage, bologna, salami, chitterlings, fatback, hot dogs, bratwurst, and packaged luncheon meats. Salted nuts and seeds. Canned beans with salt. Dairy Whole or 2% milk, cream, half-and-half, and cream cheese. Whole-fat or sweetened yogurt. Full-fat cheeses or blue cheese. Nondairy creamers and whipped toppings. Processed cheese, cheese spreads, or cheese curds. Condiments Onion and garlic salt, seasoned salt, table salt, and sea salt. Canned and packaged gravies. Worcestershire sauce. Tartar sauce. Barbecue sauce. Teriyaki sauce. Soy sauce,  including reduced sodium. Steak sauce. Fish sauce. Oyster sauce. Cocktail sauce. Horseradish. Ketchup and mustard. Meat flavorings and tenderizers. Bouillon cubes. Hot sauce. Tabasco sauce. Marinades. Taco seasonings. Relishes. Fats and Oils Butter, stick margarine, lard, shortening, ghee, and bacon fat. Coconut, palm kernel, or palm oils. Regular salad dressings. Other Pickles and olives. Salted popcorn and pretzels. The items listed above may not be a complete list of foods and beverages to avoid. Contact your dietitian for more information. WHERE CAN I FIND MORE INFORMATION? National Heart, Lung, and Blood Institute: travelstabloid.com Document Released: 09/13/2011 Document Revised: 02/08/2014 Document Reviewed: 07/29/2013 [facility] Patient Information 2015 [place], [place]. This information is not intended to replace advice given to you by your health care provider. Make sure you discuss any questions you have with your health care provider.

## 2018-11-17 NOTE — Progress Notes (Signed)
Subjective:    Patient ID: Alyssa Dudley, female    DOB: November 20, 1955, 63 y.o.   MRN: [id number]  HPI 63 y.o. AAF with history of has GERD; Constipation; History of peptic ulcer disease; Family history of malignant neoplasm of gastrointestinal tract; Benign neoplasm of colon; Mobitz type 2 second degree and high-grade heart block; Hyperlipidemia; Vitamin D deficiency; Atrial fibrillation ([name]); HTN (hypertension); Abnormal glucose; Medication management; Overweight (BMI 25.0-29.9); Meralgia paraesthetica, left; Plantar fasciitis of left foot; and Lateral epicondylitis of left elbow on their problem list.  She has history of afib, follows with Dr. [name]. CHADSVASC 1, less than 7 mins on pacemaker last OV 05/2018.  Baby sisters husband recently died. The patient has had several deaths in her family with her son, her brother and her mom passed april 2019. 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  She has HA 9-10 on pain scale, worse headache she has ever head was this AM. Took tylenol that helped some but after the funeral today it was worse. She is very tired, she has no nausea. She had some lightheaded.  She denies any associated neurological complications or symptoms, such as one-sided weakness, numbness, tingling, slurring of speech, droopy face, swallowing difficulties, diplopia, vision loss, hearing loss or tinnitus.    Had CT head 08/2015 FINDINGS: Ventricles are normal in size and configuration.  There are no parenchymal masses or mass effect. There is no evidence of an infarct. There are no extra-axial masses or abnormal fluid collections.  There is no intracranial hemorrhage.  Visualized sinuses and mastoid air cells are clear. No skull lesion.  IMPRESSION: Normal unenhanced CT scan of the brain.  Blood pressure (!) 136/98, pulse 61, temperature (!) 97.3 F (36.3 C), height 5\' 6"  (1.676 m), weight 184 lb 3.2 oz (83.6 kg), SpO2 99  %.  Medications Current Outpatient Medications on File Prior to Visit  Medication Sig  . aspirin EC 81 MG tablet Take 81 mg by mouth daily.  . diphenhydramine-acetaminophen (TYLENOL PM) 25-500 MG TABS tablet Take 1 tablet by mouth at bedtime as needed (pain/sleep).   . triamcinolone cream (KENALOG) 0.1 % Apply 1 application topically 3 (three) times daily as needed (rash).  . Vitamin D, Ergocalciferol, (DRISDOL) 50000 units CAPS capsule TAKE ONE CAPSULE BY MOUTH TWICE A WEEK   No current facility-administered medications on file prior to visit.     Problem list She has GERD; Constipation; History of peptic ulcer disease; Family history of malignant neoplasm of gastrointestinal tract; Benign neoplasm of colon; Mobitz type 2 second degree and high-grade heart block; Hyperlipidemia; Vitamin D deficiency; Atrial fibrillation ([name]); HTN (hypertension); Abnormal glucose; Medication management; Overweight (BMI 25.0-29.9); Meralgia paraesthetica, left; Plantar fasciitis of left foot; and Lateral epicondylitis of left elbow on their problem list.   Review of Systems  Constitutional: Negative.  Negative for chills, fatigue and fever.  HENT: Positive for ear pain, postnasal drip and sinus pressure. Negative for congestion, dental problem, drooling, ear discharge, facial swelling, hearing loss, mouth sores, nosebleeds, rhinorrhea, sneezing, sore throat, tinnitus and trouble swallowing.   Eyes: Negative.  Negative for visual disturbance.  Respiratory: Negative for apnea, cough, choking, chest tightness, shortness of breath, wheezing and stridor.   Cardiovascular: Negative.   Genitourinary: Negative.   Musculoskeletal: Negative.   Skin: Negative.  Negative for rash.  Neurological: Positive for headaches. Negative for dizziness, tremors, seizures, syncope, facial asymmetry, speech difficulty, weakness, light-headedness and numbness.  Psychiatric/Behavioral: Negative.  Objective:   Physical  Exam Constitutional:      Appearance: She is well-developed.  HENT:     Head: Normocephalic and atraumatic.     Right Ear: Hearing and external ear normal. No middle ear effusion. No mastoid tenderness. Tympanic membrane is not injected, erythematous, retracted or bulging.     Left Ear: Hearing and external ear normal.  No middle ear effusion. No mastoid tenderness. Tympanic membrane is not injected, erythematous, retracted or bulging.  Eyes:     General: No visual field deficit.    Conjunctiva/sclera: Conjunctivae normal.     Pupils: Pupils are equal, round, and reactive to light.  Neck:     Musculoskeletal: Normal range of motion and neck supple.  Cardiovascular:     Rate and Rhythm: Normal rate.  Pulmonary:     Effort: Pulmonary effort is normal.     Breath sounds: Normal breath sounds.  Musculoskeletal: Normal range of motion.  Skin:    General: Skin is warm and dry.  Neurological:     Mental Status: She is alert and oriented to person, place, and time.     Cranial Nerves: Cranial nerves are intact. No cranial nerve deficit or dysarthria.     Sensory: Sensation is intact.     Motor: Motor function is intact. No weakness, tremor or pronator drift.     Coordination: Coordination is intact. Romberg sign negative. Coordination normal. Finger-Nose-Finger Test normal. Rapid alternating movements normal.     Gait: Gait is intact.        Assessment & Plan:   Essential hypertension Acute nonintractable headache, unspecified headache type -     cyclobenzaprine (FLEXERIL) 10 MG tablet; Take 1 tablet (10 mg total) by mouth at bedtime as needed for muscle spasms (jaw pain). -     atenolol (TENORMIN) 25 MG tablet; Take 1 tablet (25 mg total) by mouth daily. Normal neuro No red flags patient to go to ER if there is weakness, thunderclap headache, visual changes, or any concerning factors information given to the patient, no gum/decrease hard foods, warm wet wash clothes, decrease  stress,  can do massage, and exercise.  Very important to follow up with dentist   Future Appointments  Date Time Provider [place]  03/09/2019 10:00 AM Liane Comber, NP [facility] None

## 2018-12-26 ENCOUNTER — Other Ambulatory Visit: Payer: Self-pay | Admitting: Physician Assistant

## 2018-12-30 ENCOUNTER — Ambulatory Visit (INDEPENDENT_AMBULATORY_CARE_PROVIDER_SITE_OTHER): Payer: BLUE CROSS/BLUE SHIELD | Admitting: *Deleted

## 2018-12-30 ENCOUNTER — Other Ambulatory Visit: Payer: Self-pay

## 2018-12-30 DIAGNOSIS — I441 Atrioventricular block, second degree: Secondary | ICD-10-CM | POA: Diagnosis not present

## 2018-12-30 LAB — CUP PACEART REMOTE DEVICE CHECK
Battery Remaining Longevity: 134 mo
Battery Remaining Percentage: 95.5 %
Battery Voltage: 2.96 V
Brady Statistic AP VP Percent: 45 %
Brady Statistic AP VS Percent: 1 %
Brady Statistic AS VP Percent: 55 %
Brady Statistic AS VS Percent: 1 %
Brady Statistic RA Percent Paced: 45 %
Brady Statistic RV Percent Paced: 99 %
Date Time Interrogation Session: 20200323095558
Implantable Lead Implant Date: 20140408
Implantable Lead Implant Date: 20140408
Implantable Lead Location: 753859
Implantable Lead Location: 753860
Implantable Lead Model: 1944
Implantable Lead Model: 1948
Implantable Pulse Generator Implant Date: 20140408
Lead Channel Impedance Value: 540 Ohm
Lead Channel Impedance Value: 690 Ohm
Lead Channel Pacing Threshold Amplitude: 0.5 V
Lead Channel Pacing Threshold Amplitude: 0.5 V
Lead Channel Pacing Threshold Pulse Width: 0.4 ms
Lead Channel Pacing Threshold Pulse Width: 0.4 ms
Lead Channel Sensing Intrinsic Amplitude: 12 mV
Lead Channel Sensing Intrinsic Amplitude: 3.6 mV
Lead Channel Setting Pacing Amplitude: 0.75 V
Lead Channel Setting Pacing Amplitude: 1.5 V
Lead Channel Setting Pacing Pulse Width: 0.4 ms
Lead Channel Setting Sensing Sensitivity: 2 mV
Pulse Gen Model: 2210
Pulse Gen Serial Number: 7457437

## 2019-01-05 ENCOUNTER — Encounter: Payer: Self-pay | Admitting: Cardiology

## 2019-01-05 NOTE — Progress Notes (Signed)
Remote pacemaker transmission.   

## 2019-01-15 ENCOUNTER — Ambulatory Visit: Payer: BLUE CROSS/BLUE SHIELD | Admitting: Physician Assistant

## 2019-01-15 ENCOUNTER — Encounter: Payer: Self-pay | Admitting: Physician Assistant

## 2019-01-15 ENCOUNTER — Other Ambulatory Visit: Payer: Self-pay

## 2019-01-15 VITALS — BP 128/68 | HR 83 | Temp 97.3°F | Ht 66.0 in | Wt 181.6 lb

## 2019-01-15 DIAGNOSIS — R109 Unspecified abdominal pain: Secondary | ICD-10-CM

## 2019-01-15 DIAGNOSIS — R300 Dysuria: Secondary | ICD-10-CM | POA: Diagnosis not present

## 2019-01-15 MED ORDER — HYDROCODONE-ACETAMINOPHEN 5-325 MG PO TABS: ORAL_TABLET | ORAL | 0 refills | Status: DC

## 2019-01-15 MED ORDER — ONDANSETRON HCL 4 MG PO TABS: 4.0000 mg | ORAL_TABLET | Freq: Every day | ORAL | 1 refills | Status: DC | PRN

## 2019-01-15 MED ORDER — KETOROLAC TROMETHAMINE 10 MG PO TABS: 10.0000 mg | ORAL_TABLET | Freq: Four times a day (QID) | ORAL | 0 refills | Status: DC | PRN

## 2019-01-15 NOTE — Patient Instructions (Signed)

## 2019-01-15 NOTE — Progress Notes (Signed)
Subjective:    Patient ID: Alyssa Dudley, female    DOB: January 17, 1956, 63 y.o.   MRN: [id number]  HPI 63 y.o. AAF with history of kidney stones s/p lithotripsy in 2017 with Dr. Gaynelle Arabian presents with right side pain x 2 days. 2 weeks ago she felt she had a bladder infection, given RX for Cipro from [facility]. Last 2 days she has had right sided flank pain, feels like her last kidney stone. Does hurt with movement. No nausea, fevers, when she had the bladder infection she had hematuria but none recently.  New partner 1 month ago.   Blood pressure 128/68, pulse 83, temperature (!) 97.3 F (36.3 C), height 5\' 6"  (1.676 m), weight 181 lb 9.6 oz (82.4 kg), SpO2 97 %.  Medications Current Outpatient Medications on File Prior to Visit  Medication Sig  . aspirin EC 81 MG tablet Take 81 mg by mouth daily.  . diphenhydramine-acetaminophen (TYLENOL PM) 25-500 MG TABS tablet Take 1 tablet by mouth at bedtime as needed (pain/sleep).   . triamcinolone cream (KENALOG) 0.1 % Apply 1 application topically 3 (three) times daily as needed (rash).  . Vitamin D, Ergocalciferol, (DRISDOL) 1.25 MG (50000 UT) CAPS capsule TAKE ONE CAPSULE BY MOUTH TWICE A WEEK   No current facility-administered medications on file prior to visit.     Problem list She has GERD; Constipation; History of peptic ulcer disease; Family history of malignant neoplasm of gastrointestinal tract; Benign neoplasm of colon; Mobitz type 2 second degree and high-grade heart block; Hyperlipidemia; Vitamin D deficiency; Atrial fibrillation ([place]); HTN (hypertension); Abnormal glucose; Medication management; Overweight (BMI 25.0-29.9); Meralgia paraesthetica, left; Plantar fasciitis of left foot; and Lateral epicondylitis of left elbow on their problem list.   Review of Systems  Constitutional: Negative for chills.  HENT: Negative.   Respiratory: Negative.   Cardiovascular: Negative.   Gastrointestinal: Negative.  Negative for nausea and  vomiting.  Genitourinary: Positive for dysuria, flank pain, frequency and urgency. Negative for decreased urine volume, difficulty urinating, dyspareunia, enuresis, genital sores, hematuria, menstrual problem, pelvic pain, vaginal bleeding and vaginal discharge.       Objective:   Physical Exam Constitutional:      Appearance: She is well-developed.  Neck:     Musculoskeletal: Normal range of motion and neck supple.  Cardiovascular:     Rate and Rhythm: Normal rate and regular rhythm.  Pulmonary:     Effort: Pulmonary effort is normal.     Breath sounds: Normal breath sounds.  Abdominal:     General: Bowel sounds are normal. There is no distension.     Palpations: Abdomen is soft. There is no mass.     Tenderness: There is no abdominal tenderness. There is right CVA tenderness (no rash). There is no left CVA tenderness, guarding or rebound.  Musculoskeletal: Normal range of motion.        General: No tenderness.  Skin:    General: Skin is warm and dry.  Neurological:     Mental Status: She is alert and oriented to person, place, and time.       Assessment & Plan:    Dysuria -     Urine Culture -     C. trachomatis/N. gonorrhoeae RNA - ? UTI versus rule out pyelo- continue cirpo at home until results come back  Right flank pain Possible kidney stone with history Will increase water, add lemon to her water, given medications for the pain if it gets worse -  Urinalysis, Routine w reflex microscopic -     Urine Culture -     C. trachomatis/N. gonorrhoeae RNA -     ondansetron (ZOFRAN) 4 MG tablet; Take 1 tablet (4 mg total) by mouth daily as needed for nausea or vomiting (during a kidney stone pass). -     HYDROcodone-acetaminophen (NORCO) 5-325 MG tablet; 1-2 tablets up to twice a day for a kidney stone -     ketorolac (TORADOL) 10 MG tablet; Take 1 tablet (10 mg total) by mouth every 6 (six) hours as needed (kidney stone pain).

## 2019-01-16 LAB — URINE CULTURE
MICRO NUMBER:: 387419
SPECIMEN QUALITY:: ADEQUATE

## 2019-01-16 LAB — URINALYSIS, ROUTINE W REFLEX MICROSCOPIC
Bilirubin Urine: NEGATIVE
Glucose, UA: NEGATIVE
Hgb urine dipstick: NEGATIVE
Ketones, ur: NEGATIVE
Leukocytes,Ua: NEGATIVE
Nitrite: NEGATIVE
Protein, ur: NEGATIVE
Specific Gravity, Urine: 1.017 (ref 1.001–1.03)
pH: 7 (ref 5.0–8.0)

## 2019-01-16 LAB — C. TRACHOMATIS/N. GONORRHOEAE RNA
C. trachomatis RNA, TMA: NOT DETECTED
N. gonorrhoeae RNA, TMA: NOT DETECTED

## 2019-01-17 MED ORDER — CIPROFLOXACIN HCL 500 MG PO TABS: 500.0000 mg | ORAL_TABLET | Freq: Two times a day (BID) | ORAL | 0 refills | Status: DC

## 2019-01-17 NOTE — Addendum Note (Signed)
Addended by: Vicie Mutters R on: 01/17/2019 11:07 AM   Modules accepted: Orders

## 2019-03-09 ENCOUNTER — Encounter: Payer: Self-pay | Admitting: Adult Health

## 2019-03-19 NOTE — Progress Notes (Signed)
Complete Physical  Assessment and Plan:  Encounter for routine adult health examination with abnormal findings 1 year  Mobitz type 2 second degree and high-grade heart block Pacemaker in place, continue follow up with Dr. [name]  Essential hypertension Continue medication Monitor blood pressure at home; call if consistently over 130/80 Continue DASH diet.   Reminder to go to the ER if any CP, SOB, nausea, dizziness, severe HA, changes vision/speech, left arm numbness and tingling and jaw pain.  Paroxysmal atrial fibrillation (HCC) CHADSASC 1, not on anticoag Followed by cardiology  Gastroesophageal reflux disease, esophagitis presence not specified Well managed on current medications; PRN OTC agents Discussed diet, avoiding triggers and other lifestyle changes  Benign neoplasm of colon, unspecified part of colon UTD  Meralgia paraesthetica, left Lower back pain Weight loss advised  ? Versus OA of back- will refer to ortho  Vitamin D deficiency Continue supplementation for goal of 70-100 Check vitamin D level  Overweight (BMI 25.0-29.9) Long discussion about weight loss, diet, and exercise Recommended diet heavy in fruits and veggies and low in animal meats, cheeses, and dairy products, appropriate calorie intake Patient will work on cutting down on sugary snacks Follow up at next visit  Medication management CBC, CMP/GFR, UA, magnesium  Pure hypercholesterolemia Currently at goal by lifestyle modification Continue low cholesterol diet and exercise.  Check lipid panel.   History of peptic ulcer disease Avoid NSAIDS  Abnormal glucose Discussed diet/exercise, weight management  A1C  Orders Placed This Encounter  Procedures  . CBC with Differential/Platelet  . COMPLETE METABOLIC PANEL WITH GFR  . TSH  . Lipid panel  . Hemoglobin A1c  . Magnesium  . VITAMIN D 25 Hydroxy (Vit-D Deficiency, Fractures)  . Urinalysis, Routine w reflex microscopic  .  Iron,Total/Total Iron Binding Cap  . Ferritin  . Vitamin B12  . Ambulatory referral to Orthopedic Surgery   Discussed med's effects and SE's. Screening labs and tests as requested with regular follow-up as recommended. Over 40 minutes of exam, counseling, chart review and critical decision making was performed  Future Appointments  Date Time Provider [place]  03/31/2019  7:05 AM [facility] DEVICE REMOTES [facility]  03/28/2020 10:00 AM Vicie Mutters, PA-C [facility] None     HPI  This very nice 63 y.o.female presents for complete physical. has GERD; Constipation; History of peptic ulcer disease; Family history of malignant neoplasm of gastrointestinal tract; Benign neoplasm of colon; Mobitz type 2 second degree and high-grade heart block; Hyperlipidemia; Vitamin D deficiency; Atrial fibrillation ([place]); HTN (hypertension); Abnormal glucose; Medication management; Overweight (BMI 25.0-29.9); Meralgia paraesthetica, left; Plantar fasciitis of left foot; and Lateral epicondylitis of left elbow on their problem list.   She has Afib and mobitz type 2 with pacemaker placement in 2014, follows with Dr. [name] and Leonides Schanz, low risk CHADSVASC 1 and not on anticoagulation. Patient reports no complaints at this time.   She has left leg pain, had PT and xray 10/2017, still having pain. Left leg lateral pain, worse with standing, movement, she can shift her weight that can help, will go down her leg. Will refer to ortho.   BMI is Body mass index is 30.32 kg/m., she has been working on diet and exercise.  Wt Readings from Last 3 Encounters:  03/23/19 185 lb (83.9 kg)  01/15/19 181 lb 9.6 oz (82.4 kg)  11/17/18 184 lb 3.2 oz (83.6 kg)   Her blood pressure has been controlled at home, today their BP is BP: 124/88  She does  workout. She denies chest pain, shortness of breath, dizziness.   She is not on cholesterol medication. Her cholesterol is at goal. The cholesterol last  visit was:   Lab Results  Component Value Date   CHOL 200 (H) 09/10/2018   HDL 63 09/10/2018   LDLCALC 122 (H) 09/10/2018   TRIG 60 09/10/2018   CHOLHDL 3.2 09/10/2018    She has been working on diet and exercise for glucose management, and denies foot ulcerations, increased appetite, nausea, paresthesia of the feet, polydipsia, polyuria, visual disturbances and vomiting. Last A1C in the office was:  Lab Results  Component Value Date   HGBA1C 5.5 03/04/2018   Patient is on Vitamin D supplement.   Lab Results  Component Value Date   VD25OH 53 03/04/2018       Current Medications:  Current Outpatient Medications on File Prior to Visit  Medication Sig Dispense Refill  . aspirin EC 81 MG tablet Take 81 mg by mouth daily.    . diphenhydramine-acetaminophen (TYLENOL PM) 25-500 MG TABS tablet Take 1 tablet by mouth at bedtime as needed (pain/sleep).      No current facility-administered medications on file prior to visit.    Health Maintenance:   Immunization History  Administered Date(s) Administered  . Influenza Inj Mdck Quad With Preservative 07/26/2017, 08/11/2018  . Influenza Split 06/30/2013, 07/20/2014, 07/21/2015  . Influenza,inj,quad, With Preservative 07/05/2016  . PPD Test 02/22/2014, 05/10/2015  . Tdap 10/09/2011  . Zoster Recombinat (Shingrix) 01/09/2017, 02/06/2018   Colonoscopy:05/2018 Mammo: 08/2018 - will schedule follow BMD: 2015 WNL will wait until closer to 66 Pap/ Pelvic: 2018 WNL - HPV neg - repeat 2023 AB [place] 2011 CXR 2014 Echo 01/2013  IMMUNIZATIONS: Tdap:2013 Pneumovax n/a: Prevnar 13: due at 65 Shingrix: complete Influenza:2019  EYE: Dr. Peter Garter, last 2018, wears glasses Dentist: Dr. Glynn Octave, Q 72month  Allergies: No Known Allergies Medical History:  has GERD; Constipation; History of peptic ulcer disease; Family history of malignant neoplasm of gastrointestinal tract; Benign neoplasm of colon; Mobitz type 2 second degree and high-grade heart  block; Hyperlipidemia; Vitamin D deficiency; Atrial fibrillation ([place]; HTN (hypertension); Abnormal glucose; Medication management; Overweight (BMI 25.0-29.9); Meralgia paraesthetica, left; Plantar fasciitis of left foot; and Lateral epicondylitis of left elbow on their problem list. Surgical History:  She  has a past surgical history that includes Breast cyst excision (1980); Wrist surgery (Right, 2011); permanent pacemaker insertion (N/A, 01/13/2013); Colonoscopy; Polypectomy; and Plantar fasciectomy. Family History:  Her family history includes Anemia in her sister; Cervical cancer in her mother; Colon cancer (age of onset: 531 in her sister; Diabetes in her mother; HIV/AIDS in her brother; Heart disease in her father; Hyperlipidemia in her father; Hypertension in her mother and son; Kidney disease in her son; Multiple myeloma in her maternal aunt; Multiple myeloma (age of onset: 346 in her son; Prostate cancer (age of onset: 646 in her brother. Social History:   reports that she has never smoked. She has never used smokeless tobacco. She reports that she does not drink alcohol or use drugs.  Review of Systems: Review of Systems  Constitutional: Negative for malaise/fatigue and weight loss.  HENT: Negative for hearing loss and tinnitus.   Eyes: Negative for blurred vision and double vision.  Respiratory: Negative for cough, sputum production, shortness of breath and wheezing.   Cardiovascular: Negative for chest pain, palpitations, orthopnea, claudication, leg swelling and PND.  Gastrointestinal: Negative for abdominal pain, blood in stool, constipation, diarrhea, heartburn, melena, nausea and vomiting.  Genitourinary: Negative.   Musculoskeletal: Positive for back pain and myalgias (left leg lateral pain, worse with standing, movement, she can shift her weight that can help). Negative for falls and joint pain.  Skin: Negative for rash.  Neurological: Negative for dizziness, tingling, sensory  change, weakness and headaches.  Endo/Heme/Allergies: Negative for polydipsia.  Psychiatric/Behavioral: Negative.  Negative for depression, memory loss, substance abuse and suicidal ideas. The patient is not nervous/anxious and does not have insomnia.   All other systems reviewed and are negative.   Physical Exam: Estimated body mass index is 30.32 kg/m as calculated from the following:   Height as of this encounter: 5' 5.5" (1.664 m).   Weight as of this encounter: 185 lb (83.9 kg). BP 124/88   Pulse 83   Temp (!) 97 F (36.1 C)   Ht 5' 5.5" (1.664 m)   Wt 185 lb (83.9 kg)   SpO2 98%   BMI 30.32 kg/m  General Appearance: Well nourished, in no apparent distress.  Eyes: PERRLA, EOMs, conjunctiva no swelling or erythema, normal fundi and vessels.  Sinuses: No Frontal/maxillary tenderness  ENT/Mouth: Ext aud canals clear, normal light reflex with TMs without erythema, bulging. Good dentition. No erythema, swelling, or exudate on post pharynx. Tonsils not swollen or erythematous. Hearing normal.  Neck: Supple, thyroid normal. No bruits  Respiratory: Respiratory effort normal, BS equal bilaterally without rales, rhonchi, wheezing or stridor.  Cardio: RRR without murmurs, rubs or gallops. Brisk peripheral pulses without edema.  Chest: symmetric, with normal excursions and percussion.  Breasts: Symmetric, without lumps, nipple discharge, retractions.  Abdomen: Soft, nontender, no guarding, rebound, hernias, masses, or organomegaly.  Lymphatics: Non tender without lymphadenopathy.  Genitourinary: Defer Musculoskeletal: Full ROM all peripheral extremities,5/5 strength, and normal gait, neg straight leg.  Skin: Warm, dry without rashes, lesions, ecchymosis. Neuro: Cranial nerves intact, reflexes equal bilaterally. Normal muscle tone, no cerebellar symptoms. Sensation intact.  Psych: Awake and oriented X 3, normal affect, Insight and Judgment appropriate.   EKG: WNL - no significant changes  from previous- pacemaker- will defer going forward  Vicie Mutters 12:21 PM [facility]

## 2019-03-20 ENCOUNTER — Encounter: Payer: Self-pay | Admitting: Physician Assistant

## 2019-03-23 ENCOUNTER — Encounter: Payer: Self-pay | Admitting: Physician Assistant

## 2019-03-23 ENCOUNTER — Other Ambulatory Visit: Payer: Self-pay

## 2019-03-23 ENCOUNTER — Ambulatory Visit (INDEPENDENT_AMBULATORY_CARE_PROVIDER_SITE_OTHER): Payer: BC Managed Care – PPO | Admitting: Physician Assistant

## 2019-03-23 VITALS — BP 124/88 | HR 83 | Temp 97.0°F | Ht 65.5 in | Wt 185.0 lb

## 2019-03-23 DIAGNOSIS — Z1389 Encounter for screening for other disorder: Secondary | ICD-10-CM | POA: Diagnosis not present

## 2019-03-23 DIAGNOSIS — Z136 Encounter for screening for cardiovascular disorders: Secondary | ICD-10-CM | POA: Diagnosis not present

## 2019-03-23 DIAGNOSIS — D126 Benign neoplasm of colon, unspecified: Secondary | ICD-10-CM

## 2019-03-23 DIAGNOSIS — Z130 Encounter for screening for diseases of the blood and blood-forming organs and certain disorders involving the immune mechanism: Secondary | ICD-10-CM

## 2019-03-23 DIAGNOSIS — I10 Essential (primary) hypertension: Secondary | ICD-10-CM | POA: Diagnosis not present

## 2019-03-23 DIAGNOSIS — Z13220 Encounter for screening for lipoid disorders: Secondary | ICD-10-CM | POA: Diagnosis not present

## 2019-03-23 DIAGNOSIS — Z0001 Encounter for general adult medical examination with abnormal findings: Secondary | ICD-10-CM

## 2019-03-23 DIAGNOSIS — R7309 Other abnormal glucose: Secondary | ICD-10-CM

## 2019-03-23 DIAGNOSIS — Z0000 Encounter for general adult medical examination without abnormal findings: Secondary | ICD-10-CM

## 2019-03-23 DIAGNOSIS — E559 Vitamin D deficiency, unspecified: Secondary | ICD-10-CM

## 2019-03-23 DIAGNOSIS — I441 Atrioventricular block, second degree: Secondary | ICD-10-CM

## 2019-03-23 DIAGNOSIS — Z131 Encounter for screening for diabetes mellitus: Secondary | ICD-10-CM | POA: Diagnosis not present

## 2019-03-23 DIAGNOSIS — Z8711 Personal history of peptic ulcer disease: Secondary | ICD-10-CM

## 2019-03-23 DIAGNOSIS — M5442 Lumbago with sciatica, left side: Secondary | ICD-10-CM

## 2019-03-23 DIAGNOSIS — K5900 Constipation, unspecified: Secondary | ICD-10-CM

## 2019-03-23 DIAGNOSIS — Z1329 Encounter for screening for other suspected endocrine disorder: Secondary | ICD-10-CM

## 2019-03-23 DIAGNOSIS — I480 Paroxysmal atrial fibrillation: Secondary | ICD-10-CM

## 2019-03-23 DIAGNOSIS — E663 Overweight: Secondary | ICD-10-CM

## 2019-03-23 DIAGNOSIS — E7800 Pure hypercholesterolemia, unspecified: Secondary | ICD-10-CM

## 2019-03-23 DIAGNOSIS — G8929 Other chronic pain: Secondary | ICD-10-CM

## 2019-03-23 DIAGNOSIS — K219 Gastro-esophageal reflux disease without esophagitis: Secondary | ICD-10-CM

## 2019-03-23 DIAGNOSIS — Z79899 Other long term (current) drug therapy: Secondary | ICD-10-CM

## 2019-03-23 MED ORDER — TRIAMCINOLONE ACETONIDE 0.1 % EX CREA: 1.0000 "application " | TOPICAL_CREAM | Freq: Three times a day (TID) | CUTANEOUS | 3 refills | Status: DC | PRN

## 2019-03-23 MED ORDER — VITAMIN D (ERGOCALCIFEROL) 1.25 MG (50000 UNIT) PO CAPS: ORAL_CAPSULE | ORAL | 1 refills | Status: DC

## 2019-03-24 LAB — CBC WITH DIFFERENTIAL/PLATELET
Absolute Monocytes: 398 cells/uL (ref 200–950)
Basophils Absolute: 21 cells/uL (ref 0–200)
Basophils Relative: 0.5 %
Eosinophils Absolute: 90 cells/uL (ref 15–500)
Eosinophils Relative: 2.2 %
HCT: 42.9 % (ref 35.0–45.0)
Hemoglobin: 14.6 g/dL (ref 11.7–15.5)
Lymphs Abs: 1902 cells/uL (ref 850–3900)
MCH: 30.7 pg (ref 27.0–33.0)
MCHC: 34 g/dL (ref 32.0–36.0)
MCV: 90.1 fL (ref 80.0–100.0)
MPV: 10.3 fL (ref 7.5–12.5)
Monocytes Relative: 9.7 %
Neutro Abs: 1689 cells/uL (ref 1500–7800)
Neutrophils Relative %: 41.2 %
Platelets: 273 10*3/uL (ref 140–400)
RBC: 4.76 10*6/uL (ref 3.80–5.10)
RDW: 13 % (ref 11.0–15.0)
Total Lymphocyte: 46.4 %
WBC: 4.1 10*3/uL (ref 3.8–10.8)

## 2019-03-24 LAB — URINALYSIS, ROUTINE W REFLEX MICROSCOPIC
Bilirubin Urine: NEGATIVE
Glucose, UA: NEGATIVE
Hgb urine dipstick: NEGATIVE
Ketones, ur: NEGATIVE
Leukocytes,Ua: NEGATIVE
Nitrite: NEGATIVE
Protein, ur: NEGATIVE
Specific Gravity, Urine: 1.012 (ref 1.001–1.03)
pH: 8 (ref 5.0–8.0)

## 2019-03-24 LAB — TSH: TSH: 3.12 mIU/L (ref 0.40–4.50)

## 2019-03-24 LAB — MAGNESIUM: Magnesium: 2 mg/dL (ref 1.5–2.5)

## 2019-03-24 LAB — LIPID PANEL
Cholesterol: 203 mg/dL — ABNORMAL HIGH (ref <200)
HDL: 60 mg/dL (ref >=50)
LDL Cholesterol (Calc): 127 mg/dL (calc) — ABNORMAL HIGH
Non-HDL Cholesterol (Calc): 143 mg/dL (calc) — ABNORMAL HIGH (ref <130)
Total CHOL/HDL Ratio: 3.4 (calc) (ref <5.0)
Triglycerides: 64 mg/dL (ref <150)

## 2019-03-24 LAB — IRON, TOTAL/TOTAL IRON BINDING CAP
%SAT: 29 % (calc) (ref 16–45)
Iron: 108 ug/dL (ref 45–160)
TIBC: 368 mcg/dL (calc) (ref 250–450)

## 2019-03-24 LAB — FERRITIN: Ferritin: 111 ng/mL (ref 16–288)

## 2019-03-24 LAB — COMPLETE METABOLIC PANEL WITH GFR
AG Ratio: 1.6 (calc) (ref 1.0–2.5)
ALT: 15 U/L (ref 6–29)
AST: 16 U/L (ref 10–35)
Albumin: 4.5 g/dL (ref 3.6–5.1)
Alkaline phosphatase (APISO): 106 U/L (ref 37–153)
BUN: 10 mg/dL (ref 7–25)
CO2: 28 mmol/L (ref 20–32)
Calcium: 9.6 mg/dL (ref 8.6–10.4)
Chloride: 107 mmol/L (ref 98–110)
Creat: 0.63 mg/dL (ref 0.50–0.99)
GFR, Est African American: 112 mL/min/{1.73_m2} (ref >=60)
GFR, Est Non African American: 97 mL/min/{1.73_m2} (ref >=60)
Globulin: 2.9 g/dL (calc) (ref 1.9–3.7)
Glucose, Bld: 88 mg/dL (ref 65–99)
Potassium: 4.4 mmol/L (ref 3.5–5.3)
Sodium: 141 mmol/L (ref 135–146)
Total Bilirubin: 0.7 mg/dL (ref 0.2–1.2)
Total Protein: 7.4 g/dL (ref 6.1–8.1)

## 2019-03-24 LAB — HEMOGLOBIN A1C
Hgb A1c MFr Bld: 5.3 % of total Hgb (ref <5.7)
Mean Plasma Glucose: 105 (calc)
eAG (mmol/L): 5.8 (calc)

## 2019-03-24 LAB — VITAMIN B12: Vitamin B-12: 542 pg/mL (ref 200–1100)

## 2019-03-24 LAB — VITAMIN D 25 HYDROXY (VIT D DEFICIENCY, FRACTURES): Vit D, 25-Hydroxy: 73 ng/mL (ref 30–100)

## 2019-03-25 ENCOUNTER — Encounter: Payer: Self-pay | Admitting: Physician Assistant

## 2019-03-31 ENCOUNTER — Encounter: Payer: BLUE CROSS/BLUE SHIELD | Admitting: *Deleted

## 2019-04-01 ENCOUNTER — Telehealth: Payer: Self-pay

## 2019-04-01 NOTE — Telephone Encounter (Signed)
Left message for patient to remind of missed remote transmission.  

## 2019-04-20 ENCOUNTER — Other Ambulatory Visit: Payer: Self-pay | Admitting: Adult Health

## 2019-05-04 NOTE — Addendum Note (Signed)
Addended by: Vicie Mutters R on: 05/04/2019 10:00 AM   Modules accepted: Orders

## 2019-05-12 ENCOUNTER — Other Ambulatory Visit: Payer: Self-pay

## 2019-05-12 DIAGNOSIS — Z20822 Contact with and (suspected) exposure to covid-19: Secondary | ICD-10-CM

## 2019-05-12 DIAGNOSIS — R6889 Other general symptoms and signs: Secondary | ICD-10-CM | POA: Diagnosis not present

## 2019-05-13 LAB — NOVEL CORONAVIRUS, NAA: SARS-CoV-2, NAA: DETECTED — AB

## 2019-05-16 ENCOUNTER — Other Ambulatory Visit: Payer: Self-pay | Admitting: Physician Assistant

## 2019-06-04 ENCOUNTER — Telehealth: Payer: Self-pay

## 2019-06-05 NOTE — Telephone Encounter (Signed)
Spoke with pt regarding appt on 06/08/19. Pt stated she needs to come in office to have her device checked. Pt was advise to discuss all her concerns wit Dr [name] at her appt and check her vitals prior to her appt. Pt questions were address.

## 2019-06-08 ENCOUNTER — Telehealth (INDEPENDENT_AMBULATORY_CARE_PROVIDER_SITE_OTHER): Payer: Self-pay | Admitting: Internal Medicine

## 2019-06-08 ENCOUNTER — Encounter: Payer: Self-pay | Admitting: Internal Medicine

## 2019-06-08 VITALS — Ht 65.5 in | Wt 180.0 lb

## 2019-06-08 DIAGNOSIS — I441 Atrioventricular block, second degree: Secondary | ICD-10-CM

## 2019-06-08 DIAGNOSIS — I480 Paroxysmal atrial fibrillation: Secondary | ICD-10-CM

## 2019-06-08 NOTE — Progress Notes (Signed)
Electrophysiology TeleHealth Note   Due to national recommendations of social distancing due to COVID 19, an audio/video telehealth visit is felt to be most appropriate for this patient at this time.  See MyChart message from today for the patient's consent to telehealth for Alyssa Dudley.   Date:  06/08/2019   ID:  Alyssa Dudley, DOB 06-21-1956, MRN [id number]  Location: patient's home  Provider location:  [place]  Evaluation Performed: Follow-up visit  PCP:  Unk Pinto, MD   Electrophysiologist:  Dr [name]  Chief Complaint:  palpitations  History of Present Illness:    Alyssa Dudley is a 63 y.o. female who presents via telehealth conferencing today.  Since last being seen in our clinic, the patient reports doing very well.  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  They have been able to remain operational during [place].  Today, she denies symptoms of palpitations, chest pain, shortness of breath,  lower extremity edema, dizziness, presyncope, or syncope.  The patient is otherwise without complaint today.  The patient denies symptoms of fevers, chills, cough, or new SOB worrisome for COVID 19.  Past Medical History:  Diagnosis Date  . Allergy    once a year   . Hyperlipidemia    borderline- no meds   . Pacemaker   . Paroxysmal atrial fibrillation ([place]) 6/15   detected on ppm interrogation  . PONV (postoperative nausea and vomiting)    has vomitted in past  . Second degree Mobitz II AV block   . Vitamin D deficiency     Past Surgical History:  Procedure Laterality Date  . BREAST CYST EXCISION  1980  . COLONOSCOPY    . PERMANENT PACEMAKER INSERTION N/A 01/13/2013   SJM Acc[employer] DR RF implanted by Dr [name] for mobitz II AV block  . PLANTAR FASCIECTOMY    . POLYPECTOMY    . WRIST SURGERY Right 2011   removed cyst    Current Outpatient Medications  Medication Sig Dispense Refill  . aspirin EC 81 MG tablet Take 81 mg by mouth daily.    Marland Kitchen atenolol  (TENORMIN) 25 MG tablet Take 1 tablet Daily for BP 90 tablet 1  . diphenhydramine-acetaminophen (TYLENOL PM) 25-500 MG TABS tablet Take 1 tablet by mouth at bedtime as needed (pain/sleep).     . triamcinolone cream (KENALOG) 0.1 % Apply 1 application topically 3 (three) times daily as needed (rash). 30 g 3  . Vitamin D, Ergocalciferol, (DRISDOL) 1.25 MG (50000 UT) CAPS capsule TAKE 1 CAPSULE BY MOUTH 2 TIMES A WEEK 60 capsule 1   No current facility-administered medications for this visit.     Allergies:   Patient has no known allergies.   Social History:  The patient  reports that she has never smoked. She has never used smokeless tobacco. She reports that she does not drink alcohol or use drugs.   Family History:  The patient's family history includes Anemia in her sister; Cervical cancer in her mother; Colon cancer (age of onset: 25) in her sister; Diabetes in her mother; HIV/AIDS in her brother; Heart disease in her father; Hyperlipidemia in her father; Hypertension in her mother and son; Kidney disease in her son; Multiple myeloma in her maternal aunt; Multiple myeloma (age of onset: 11) in her son; Prostate cancer (age of onset: 70) in her brother.   ROS:  Please see the history of present illness.   All other systems are personally reviewed and negative.    Exam:  Vital Signs:  Ht 5' 5.5" (1.664 m)   Wt 180 lb (81.6 kg)   BMI 29.50 kg/m   Well sounding and appearing, alert and conversant, regular work of breathing,  good skin color Eyes- anicteric, neuro- grossly intact, skin- no apparent rash or lesions or cyanosis, mouth- oral mucosa is pink  Labs/Other Tests and Data Reviewed:    Recent Labs: 03/23/2019: ALT 15; BUN 10; Creat 0.63; Hemoglobin 14.6; Magnesium 2.0; Platelets 273; Potassium 4.4; Sodium 141; TSH 3.12   Wt Readings from Last 3 Encounters:  06/08/19 180 lb (81.6 kg)  03/23/19 185 lb (83.9 kg)  01/15/19 181 lb 9.6 oz (82.4 kg)     Last device remote is  reviewed from [employer] PDF which reveals normal device function, last remote was march   ASSESSMENT & PLAN:    1.  Mobitz II second degree AV block Remotes are overdue We will work with her to get connected for remotes Last remote 3/20 looked good  2. Paroxysmal atrial fibrillation Very infrequent and short episodes. chads2vasc score is 1.  No indication for anticoagulation at this time   Follow-up:  12 months with EP NP   Patient Risk:  after full review of this patients clinical status, I feel that they are at moderate risk at this time.  Today, I have spent 15 minutes with the patient with telehealth technology discussing arrhythmia management .    SignedThompson Grayer, MD  06/08/2019 1:55 PM     [address] [place] [address] [phone] (office) [phone] (fax)

## 2019-06-09 ENCOUNTER — Telehealth: Payer: Self-pay | Admitting: Cardiology

## 2019-06-09 ENCOUNTER — Encounter: Payer: Self-pay | Admitting: Physician Assistant

## 2019-06-09 ENCOUNTER — Other Ambulatory Visit: Payer: Self-pay

## 2019-06-09 ENCOUNTER — Ambulatory Visit (INDEPENDENT_AMBULATORY_CARE_PROVIDER_SITE_OTHER): Payer: BC Managed Care – PPO | Admitting: Physician Assistant

## 2019-06-09 VITALS — BP 138/80 | HR 66 | Temp 97.4°F | Ht 65.5 in | Wt 183.0 lb

## 2019-06-09 DIAGNOSIS — R1031 Right lower quadrant pain: Secondary | ICD-10-CM

## 2019-06-09 DIAGNOSIS — R39198 Other difficulties with micturition: Secondary | ICD-10-CM | POA: Diagnosis not present

## 2019-06-09 MED ORDER — SULFAMETHOXAZOLE-TRIMETHOPRIM 800-160 MG PO TABS: 1.0000 | ORAL_TABLET | Freq: Two times a day (BID) | ORAL | 0 refills | Status: DC

## 2019-06-09 NOTE — Telephone Encounter (Signed)
Spoke w/ pt and attempted to help her trouble shoot her Merlin at home monitor. She was not at home at this time. She is going to call back around 1 PM to get help sending a transmission.

## 2019-06-09 NOTE — Telephone Encounter (Signed)
-----   Message from Thompson Grayer, MD sent at 06/08/2019  1:59 PM EDT ----- Last remote was march.  She says she is having trouble with her transmitter and has tried multiple times.  Please assist.

## 2019-06-09 NOTE — Patient Instructions (Signed)
INFORMATION ABOUT YOUR XRAY  Can walk into [address] building for an Insurance account manager. They will have the order and take you back. You do not any paper work, I should get the result back today or tomorrow. This order is good for a year.  Can call [phone] to schedule an appointment if you wish.    Abdominal Pain, Adult Abdominal pain can be caused by many things. Often, abdominal pain is not serious and it gets better with no treatment or by being treated at home. However, sometimes abdominal pain is serious. Your health care provider will do a medical history and a physical exam to try to determine the cause of your abdominal pain. Follow these instructions at home:  Take over-the-counter and prescription medicines only as told by your health care provider. Do not take a laxative unless told by your health care provider.  Drink enough fluid to keep your urine clear or pale yellow.  Watch your condition for any changes.  Keep all follow-up visits as told by your health care provider. This is important. Contact a health care provider if:  Your abdominal pain changes or gets worse.  You are not hungry or you lose weight without trying.  You are constipated or have diarrhea for more than 2-3 days.  You have pain when you urinate or have a bowel movement.  Your abdominal pain wakes you up at night.  Your pain gets worse with meals, after eating, or with certain foods.  You are throwing up and cannot keep anything down.  You have a fever. Get help right away if:  Your pain does not go away as soon as your health care provider told you to expect.  You cannot stop throwing up.  Your pain is only in areas of the abdomen, such as the right side or the left lower portion of the abdomen.  You have bloody or black stools, or stools that look like tar.  You have severe pain, cramping, or bloating in your abdomen.  You have signs of dehydration, such as: ? Dark urine, very little  urine, or no urine. ? Cracked lips. ? Dry mouth. ? Sunken eyes. ? Sleepiness. ? Weakness. This information is not intended to replace advice given to you by your health care provider. Make sure you discuss any questions you have with your health care provider. Document Released: 07/04/2005 Document Revised: 04/13/2016 Document Reviewed: 03/07/2016 Elsevier Interactive Patient Education  [employer].

## 2019-06-09 NOTE — Progress Notes (Signed)
Subjective:    Patient ID: Ernesto Rutherford, female    DOB: 11-28-1955, 63 y.o.   MRN: [id number]  HPI 63 y.o. AAF presents with AB pain. She had right lower throbbing AB pain x Thursday. Has gotten worse. States this AM it woke her up in the morning.  No fever, chills.  Nothing better/worse.  He denies nausea, constipation, blood/mucus in emesis, melena, hematochezia. Denies NSAID use. Denies ETOH.   Colonoscopy 05/2018 CT AB 2012  IMPRESSION: Nonobstructing stone in the lower pole of the right kidney.  No ureteral stones demonstrated.  No pyelocaliectasis or ureterectasis.  Blood pressure 138/80, pulse 66, temperature (!) 97.4 F (36.3 C), height 5' 5.5" (1.664 m), weight 183 lb (83 kg), SpO2 96 %.  Review of Systems  Constitutional: Negative for activity change, appetite change, chills, diaphoresis, fatigue, fever and unexpected weight change.  HENT: Negative.   Respiratory: Negative.  Negative for cough.   Cardiovascular: Negative.   Gastrointestinal: Positive for abdominal pain. Negative for abdominal distention, anal bleeding, blood in stool, constipation, diarrhea, nausea, rectal pain and vomiting.  Genitourinary: Negative.   Musculoskeletal: Negative for arthralgias, back pain, gait problem, joint swelling, myalgias, neck pain and neck stiffness.  Skin: Negative.  Negative for rash.  Neurological: Negative for dizziness and headaches.       Objective:   Physical Exam Constitutional:      Appearance: She is well-developed.  HENT:     Head: Normocephalic and atraumatic.     Right Ear: External ear normal.     Left Ear: External ear normal.  Eyes:     Conjunctiva/sclera: Conjunctivae normal.     Pupils: Pupils are equal, round, and reactive to light.  Neck:     Musculoskeletal: Normal range of motion and neck supple.     Thyroid: No thyromegaly.  Cardiovascular:     Rate and Rhythm: Normal rate and regular rhythm.     Heart sounds: Normal heart sounds. No  murmur. No friction rub. No gallop.   Pulmonary:     Effort: Pulmonary effort is normal. No respiratory distress.     Breath sounds: Normal breath sounds. No wheezing.  Abdominal:     General: Abdomen is flat. Bowel sounds are normal. There is no distension or abdominal bruit.     Palpations: Abdomen is soft. Abdomen is not rigid. There is no shifting dullness, hepatomegaly, splenomegaly, mass or pulsatile mass.     Tenderness: There is abdominal tenderness in the right lower quadrant. There is no right CVA tenderness, left CVA tenderness, guarding or rebound. Negative signs include Murphy's sign and McBurney's sign.     Hernia: No hernia is present.     Comments: Negative heel tap  Musculoskeletal: Normal range of motion.  Lymphadenopathy:     Cervical: No cervical adenopathy.  Skin:    General: Skin is warm and dry.  Neurological:     Mental Status: She is alert and oriented to person, place, and time.    Assessment & Plan:   Right lower quadrant abdominal pain -     CBC with Differential/Platelet -     COMPLETE METABOLIC PANEL WITH GFR -     Urinalysis, Routine w reflex microscopic -     Urine Culture -     DG Abd 1 View; Future  Benign AB exam No significant symptoms Will get labs, get KUB with history of kidney stones If negative may get US/CT  The patient was advised to call immediately if she  has any concerning symptoms in the interval. The patient voices understanding of current treatment options and is in agreement with the current care plan.The patient knows to call the clinic with any problems, questions or concerns or go to the ER if any further progression of symptoms.

## 2019-06-10 ENCOUNTER — Ambulatory Visit (INDEPENDENT_AMBULATORY_CARE_PROVIDER_SITE_OTHER): Payer: BC Managed Care – PPO | Admitting: *Deleted

## 2019-06-10 DIAGNOSIS — I480 Paroxysmal atrial fibrillation: Secondary | ICD-10-CM

## 2019-06-10 DIAGNOSIS — I441 Atrioventricular block, second degree: Secondary | ICD-10-CM

## 2019-06-10 NOTE — Telephone Encounter (Signed)
Added transmission received today to schedule for processing.

## 2019-06-10 NOTE — Telephone Encounter (Signed)
Pt called to get help sending a transmission with her home monitor. Transmission received. I told her to make sure she has her monitor on a night stand with nothing blocking her and the monitor. She previously had the monitor under the bed. The pt verbalized understanding. I told her if the nurse see anything she will give her a call. I told her if the nurse do not see anything she will not receive a call.

## 2019-06-11 ENCOUNTER — Other Ambulatory Visit: Payer: Self-pay

## 2019-06-11 ENCOUNTER — Ambulatory Visit
Admission: RE | Admit: 2019-06-11 | Discharge: 2019-06-11 | Disposition: A | Discharge status: Home / Self Care | Payer: Self-pay | Source: Ambulatory Visit | Attending: Physician Assistant | Admitting: Physician Assistant

## 2019-06-11 DIAGNOSIS — R39198 Other difficulties with micturition: Secondary | ICD-10-CM

## 2019-06-11 DIAGNOSIS — R1031 Right lower quadrant pain: Secondary | ICD-10-CM

## 2019-06-11 LAB — CUP PACEART REMOTE DEVICE CHECK
Battery Remaining Longevity: 124 mo
Battery Remaining Percentage: 91 %
Battery Voltage: 2.95 V
Brady Statistic AP VP Percent: 41 %
Brady Statistic AP VS Percent: 1 %
Brady Statistic AS VP Percent: 59 %
Brady Statistic AS VS Percent: 1 %
Brady Statistic RA Percent Paced: 41 %
Brady Statistic RV Percent Paced: 99 %
Date Time Interrogation Session: 20200902133824
Implantable Lead Implant Date: 20140408
Implantable Lead Implant Date: 20140408
Implantable Lead Location: 753859
Implantable Lead Location: 753860
Implantable Lead Model: 1944
Implantable Lead Model: 1948
Implantable Pulse Generator Implant Date: 20140408
Lead Channel Impedance Value: 510 Ohm
Lead Channel Impedance Value: 660 Ohm
Lead Channel Pacing Threshold Amplitude: 0.5 V
Lead Channel Pacing Threshold Amplitude: 0.5 V
Lead Channel Pacing Threshold Pulse Width: 0.4 ms
Lead Channel Pacing Threshold Pulse Width: 0.4 ms
Lead Channel Sensing Intrinsic Amplitude: 12 mV
Lead Channel Sensing Intrinsic Amplitude: 3.2 mV
Lead Channel Setting Pacing Amplitude: 0.75 V
Lead Channel Setting Pacing Amplitude: 1.5 V
Lead Channel Setting Pacing Pulse Width: 0.4 ms
Lead Channel Setting Sensing Sensitivity: 2 mV
Pulse Gen Model: 2210
Pulse Gen Serial Number: 7457437

## 2019-06-11 LAB — CBC WITH DIFFERENTIAL/PLATELET
Absolute Monocytes: 561 cells/uL (ref 200–950)
Basophils Absolute: 20 cells/uL (ref 0–200)
Basophils Relative: 0.4 %
Eosinophils Absolute: 158 cells/uL (ref 15–500)
Eosinophils Relative: 3.1 %
HCT: 42.7 % (ref 35.0–45.0)
Hemoglobin: 14.3 g/dL (ref 11.7–15.5)
Lymphs Abs: 2290 cells/uL (ref 850–3900)
MCH: 31.2 pg (ref 27.0–33.0)
MCHC: 33.5 g/dL (ref 32.0–36.0)
MCV: 93.2 fL (ref 80.0–100.0)
MPV: 10.4 fL (ref 7.5–12.5)
Monocytes Relative: 11 %
Neutro Abs: 2071 cells/uL (ref 1500–7800)
Neutrophils Relative %: 40.6 %
Platelets: 225 10*3/uL (ref 140–400)
RBC: 4.58 10*6/uL (ref 3.80–5.10)
RDW: 13.2 % (ref 11.0–15.0)
Total Lymphocyte: 44.9 %
WBC: 5.1 10*3/uL (ref 3.8–10.8)

## 2019-06-11 LAB — COMPLETE METABOLIC PANEL WITH GFR
AG Ratio: 1.3 (calc) (ref 1.0–2.5)
ALT: 12 U/L (ref 6–29)
AST: 14 U/L (ref 10–35)
Albumin: 4.2 g/dL (ref 3.6–5.1)
Alkaline phosphatase (APISO): 103 U/L (ref 37–153)
BUN: 15 mg/dL (ref 7–25)
CO2: 29 mmol/L (ref 20–32)
Calcium: 9.8 mg/dL (ref 8.6–10.4)
Chloride: 106 mmol/L (ref 98–110)
Creat: 0.86 mg/dL (ref 0.50–0.99)
GFR, Est African American: 84 mL/min/{1.73_m2} (ref >=60)
GFR, Est Non African American: 72 mL/min/{1.73_m2} (ref >=60)
Globulin: 3.2 g/dL (calc) (ref 1.9–3.7)
Glucose, Bld: 88 mg/dL (ref 65–99)
Potassium: 4.9 mmol/L (ref 3.5–5.3)
Sodium: 143 mmol/L (ref 135–146)
Total Bilirubin: 0.7 mg/dL (ref 0.2–1.2)
Total Protein: 7.4 g/dL (ref 6.1–8.1)

## 2019-06-11 LAB — URINE CULTURE
MICRO NUMBER:: 835332
SPECIMEN QUALITY:: ADEQUATE

## 2019-06-11 LAB — URINALYSIS, ROUTINE W REFLEX MICROSCOPIC
Bilirubin Urine: NEGATIVE
Glucose, UA: NEGATIVE
Hgb urine dipstick: NEGATIVE
Ketones, ur: NEGATIVE
Leukocytes,Ua: NEGATIVE
Nitrite: NEGATIVE
Protein, ur: NEGATIVE
Specific Gravity, Urine: 1.016 (ref 1.001–1.03)
pH: 8 (ref 5.0–8.0)

## 2019-06-17 ENCOUNTER — Other Ambulatory Visit: Payer: Self-pay | Admitting: Physician Assistant

## 2019-06-17 DIAGNOSIS — R109 Unspecified abdominal pain: Secondary | ICD-10-CM

## 2019-06-17 MED ORDER — KETOROLAC TROMETHAMINE 10 MG PO TABS: 10.0000 mg | ORAL_TABLET | Freq: Four times a day (QID) | ORAL | 0 refills | Status: DC | PRN

## 2019-06-17 MED ORDER — TAMSULOSIN HCL 0.4 MG PO CAPS: 0.4000 mg | ORAL_CAPSULE | Freq: Every day | ORAL | 0 refills | Status: DC

## 2019-06-26 NOTE — Progress Notes (Signed)
Remote pacemaker transmission.   

## 2019-06-29 ENCOUNTER — Ambulatory Visit
Admission: RE | Admit: 2019-06-29 | Discharge: 2019-06-29 | Disposition: A | Discharge status: Home / Self Care | Payer: BC Managed Care – PPO | Source: Ambulatory Visit | Attending: Physician Assistant | Admitting: Physician Assistant

## 2019-06-29 DIAGNOSIS — R109 Unspecified abdominal pain: Secondary | ICD-10-CM

## 2019-06-29 DIAGNOSIS — N200 Calculus of kidney: Secondary | ICD-10-CM | POA: Diagnosis not present

## 2019-07-13 ENCOUNTER — Ambulatory Visit (INDEPENDENT_AMBULATORY_CARE_PROVIDER_SITE_OTHER): Payer: BC Managed Care – PPO

## 2019-07-13 ENCOUNTER — Other Ambulatory Visit: Payer: Self-pay

## 2019-07-13 ENCOUNTER — Other Ambulatory Visit: Payer: Self-pay | Admitting: Physician Assistant

## 2019-07-13 VITALS — Temp 96.4°F

## 2019-07-13 DIAGNOSIS — Z23 Encounter for immunization: Secondary | ICD-10-CM

## 2019-07-13 MED ORDER — VITAMIN D (ERGOCALCIFEROL) 1.25 MG (50000 UNIT) PO CAPS: ORAL_CAPSULE | ORAL | 1 refills | Status: DC

## 2019-07-13 NOTE — Progress Notes (Signed)
Patient presents to the office for Flu Vaccine. Vaccine administered toLEFTDeltoid withoutanycomplication. Temperature taken and recorded

## 2019-08-21 DIAGNOSIS — Z1231 Encounter for screening mammogram for malignant neoplasm of breast: Secondary | ICD-10-CM | POA: Diagnosis not present

## 2019-08-21 LAB — HM MAMMOGRAPHY

## 2019-08-26 ENCOUNTER — Encounter: Payer: Self-pay | Admitting: *Deleted

## 2019-09-09 ENCOUNTER — Ambulatory Visit (INDEPENDENT_AMBULATORY_CARE_PROVIDER_SITE_OTHER): Payer: BC Managed Care – PPO | Admitting: *Deleted

## 2019-09-09 DIAGNOSIS — I441 Atrioventricular block, second degree: Secondary | ICD-10-CM

## 2019-09-11 LAB — CUP PACEART REMOTE DEVICE CHECK
Battery Remaining Longevity: 124 mo
Battery Remaining Percentage: 91 %
Battery Voltage: 2.95 V
Brady Statistic AP VP Percent: 41 %
Brady Statistic AP VS Percent: 1 %
Brady Statistic AS VP Percent: 59 %
Brady Statistic AS VS Percent: 1 %
Brady Statistic RA Percent Paced: 40 %
Brady Statistic RV Percent Paced: 99 %
Date Time Interrogation Session: 20201203195735
Implantable Lead Implant Date: 20140408
Implantable Lead Implant Date: 20140408
Implantable Lead Location: 753859
Implantable Lead Location: 753860
Implantable Lead Model: 1944
Implantable Lead Model: 1948
Implantable Pulse Generator Implant Date: 20140408
Lead Channel Impedance Value: 530 Ohm
Lead Channel Impedance Value: 690 Ohm
Lead Channel Pacing Threshold Amplitude: 0.5 V
Lead Channel Pacing Threshold Amplitude: 0.5 V
Lead Channel Pacing Threshold Pulse Width: 0.4 ms
Lead Channel Pacing Threshold Pulse Width: 0.4 ms
Lead Channel Sensing Intrinsic Amplitude: 12 mV
Lead Channel Sensing Intrinsic Amplitude: 4.4 mV
Lead Channel Setting Pacing Amplitude: 0.75 V
Lead Channel Setting Pacing Amplitude: 1.5 V
Lead Channel Setting Pacing Pulse Width: 0.4 ms
Lead Channel Setting Sensing Sensitivity: 2 mV
Pulse Gen Model: 2210
Pulse Gen Serial Number: 7457437

## 2019-09-21 NOTE — Progress Notes (Signed)
FOLLOW UP  Assessment and Plan:  Mobitz type 2 second degree and high-grade heart block Has pacemaker; monitor   Paroxysmal atrial fibrillation Chadsvasc of 1, not on coagulation, following with Cardio  Essential hypertension - She has not been taking med consistently; reviewed need for consistency with BP meds She expresses understanding, agrees to start taking atenolol daily in PM, follow up in 2 weeks for BP recheck  - continue DASH diet, exercise and monitor at home. Call if greater than 130/80. - CBC with Differential/Platelet - CMP/GFR - TSH   Hyperlipidemia -check lipids, decrease fatty foods, increase activity.  - ADD on fiber, no meds at this time, wants to try diet - Lipid panel - reviewed benefits of statins  Overweight Long discussion about weight loss, diet, and exercise Recommended diet heavy in fruits and veggies and low in animal meats, cheeses, and dairy products, appropriate calorie intake Patient will work on adding exercise, portion control  Discussed appropriate weight for height and initial goal (<175 lb) Follow up at next visit  Vitamin D Deficiency At goal at recent check; continue to recommend supplementation for goal of 70-100 Defer vitamin D level    Discussed med's effects and SE's. Screening labs and tests as requested with regular follow-up as recommended. Future Appointments  Date Time Provider [place]  12/09/2019  9:55 AM [facility]  03/09/2020  9:55 AM [facility]  03/28/2020 10:00 AM Vicie Mutters, PA-C [facility] None  06/08/2020  9:55 AM [facility]  09/07/2020  9:55 AM [facility]    HPI  63 y.o. female  presents for 6 month follow up for HTN, chol, preDM, vitamin D def.   She is engaged to get married this February.   BMI is Body mass index is 29.99 kg/m., she has  been working on diet, but admits not walking due to cold weather. She plans to start a weight loss program after the holidays, wants to lose 10-15 lb.  Wt Readings from Last 3 Encounters:  09/22/19 183 lb (83 kg)  06/09/19 183 lb (83 kg)  06/08/19 180 lb (81.6 kg)   She admits not checking BP at home, today their BP is BP: (!) 142/90. She admits taking atenolol 25 mg PRN only rather than daily, hasn't taken in several weeks.  She does workout. She denies chest pain, shortness of breath, dizziness.  She has Afib and mobitz type 2, follows with Dr. [name] and Leonides Schanz, low risk CHADSVASC 1 and not on anticoagulation.   She is not on cholesterol medication, was suppose to be on crestor but never started taking due to fear of SE. She reports has been working on lifestyle.  Her cholesterol is not at goal. The cholesterol last visit was:   Lab Results  Component Value Date   CHOL 203 (H) 03/23/2019   HDL 60 03/23/2019   LDLCALC 127 (H) 03/23/2019   TRIG 64 03/23/2019   CHOLHDL 3.4 03/23/2019    She has been working on diet and exercise for hx prediabetes and has done a good job of decreasing her A1C, and denies paresthesia of the feet, polydipsia, polyuria and visual disturbances. Last A1C in the office was:  Lab Results  Component Value Date   HGBA1C 5.3 03/23/2019   Lab Results  Component Value Date   GFRAA 84 06/09/2019   Patient is on Vitamin D supplement, 50,000 IU only once a week.   Lab Results  Component Value Date   VD25OH 73 03/23/2019      Current Medications:  Current Outpatient Medications on File Prior to Visit  Medication Sig Dispense Refill  . aspirin EC 81 MG tablet Take 81 mg by mouth daily.    Marland Kitchen atenolol (TENORMIN) 25 MG tablet Take 1 tablet Daily for BP (Patient taking differently: as needed. Take 1 tablet Daily for BP) 90 tablet 1  . diphenhydramine-acetaminophen (TYLENOL PM) 25-500 MG TABS tablet Take 1 tablet by mouth at bedtime as needed (pain/sleep).     .  triamcinolone cream (KENALOG) 0.1 % Apply 1 application topically 3 (three) times daily as needed (rash). 30 g 3  . Vitamin D, Ergocalciferol, (DRISDOL) 1.25 MG (50000 UT) CAPS capsule TAKE 1 CAPSULE BY MOUTH 2 TIMES A WEEK 60 capsule 1  . ketorolac (TORADOL) 10 MG tablet Take 1 tablet (10 mg total) by mouth every 6 (six) hours as needed (for kidney stone pain). (Patient not taking: Reported on 09/22/2019) 20 tablet 0  . sulfamethoxazole-trimethoprim (BACTRIM DS) 800-160 MG tablet Take 1 tablet by mouth 2 (two) times daily. 10 tablet 0  . tamsulosin (FLOMAX) 0.4 MG CAPS capsule TAKE 1 CAPSULE(0.4 MG) BY MOUTH DAILY AFTER SUPPER 90 capsule 3   No current facility-administered medications on file prior to visit.   Patient Active Problem List   Diagnosis Date Noted  . Meralgia paraesthetica, left 02/13/2018  . Plantar fasciitis of left foot 02/13/2018  . Lateral epicondylitis of left elbow 02/13/2018  . Overweight (BMI 25.0-29.9) 02/25/2015  . HTN (hypertension) 08/17/2014  . Abnormal glucose 08/17/2014  . Medication management 08/17/2014  . Atrial fibrillation ([place]) 06/07/2014  . Hyperlipidemia   . Vitamin D deficiency   . Mobitz type 2 second degree and high-grade heart block 01/07/2013  . Family history of malignant neoplasm of gastrointestinal tract 10/15/2011  . Benign neoplasm of colon 10/15/2011  . GERD 01/10/2008  . Constipation 01/10/2008  . History of peptic ulcer disease 01/10/2008     Allergies: No Known Allergies   Review of Systems: Review of Systems  Constitutional: Negative.   HENT: Negative.   Eyes: Negative.   Respiratory: Negative.   Cardiovascular: Negative.   Gastrointestinal: Negative.   Genitourinary: Negative.   Musculoskeletal: Negative.   Skin: Negative.   Neurological: Negative.   Endo/Heme/Allergies: Negative.   Psychiatric/Behavioral: Negative for depression, hallucinations, memory loss, substance abuse and suicidal ideas. The patient is not  nervous/anxious and does not have insomnia.    Physical Exam: Estimated body mass index is 29.99 kg/m as calculated from the following:   Height as of this encounter: 5' 5.5" (1.664 m).   Weight as of this encounter: 183 lb (83 kg). BP (!) 142/90   Pulse 69   Temp (!) 96.3 F (35.7 C)   Ht 5' 5.5" (1.664 m)   Wt 183 lb (83 kg)   SpO2 99%   BMI 29.99 kg/m  General Appearance: Well nourished, in no apparent distress.  Eyes: PERRLA, EOMs, conjunctiva no swelling or erythema Sinuses: No Frontal/maxillary tenderness  ENT/Mouth: Ext aud canals clear, normal light reflex with TMs without erythema, bulging. Good dentition. No erythema, swelling, or exudate on post pharynx. Tonsils not swollen or erythematous. Hearing normal.  Neck: Supple, thyroid normal. No bruits  Respiratory: Respiratory effort normal, BS equal bilaterally without rales, rhonchi, wheezing or stridor.  Cardio: RRR without murmurs, rubs or gallops. Brisk peripheral pulses without edema.  Chest: symmetric, with normal excursions and percussion. .  Abdomen: Soft,  nontender, no guarding, rebound, hernias, masses, or organomegaly.  Lymphatics: Non tender without lymphadenopathy.  Musculoskeletal: Full ROM all peripheral extremities,5/5 strength, and normal gait Skin: Warm, dry without rashes, lesions, ecchymosis. Neuro: Cranial nerves intact, reflexes equal bilaterally. Normal muscle tone, no cerebellar symptoms. Sensation intact.  Psych: Awake and oriented X 3, normal affect, Insight and Judgment appropriate.    Gorden Harms Juliene Kirsh 9:03 AM [facility]

## 2019-09-22 ENCOUNTER — Encounter: Payer: Self-pay | Admitting: Adult Health

## 2019-09-22 ENCOUNTER — Other Ambulatory Visit: Payer: Self-pay

## 2019-09-22 ENCOUNTER — Ambulatory Visit (INDEPENDENT_AMBULATORY_CARE_PROVIDER_SITE_OTHER): Payer: BC Managed Care – PPO | Admitting: Adult Health

## 2019-09-22 ENCOUNTER — Ambulatory Visit: Payer: BC Managed Care – PPO | Admitting: Physician Assistant

## 2019-09-22 VITALS — BP 142/90 | HR 69 | Temp 96.3°F | Ht 65.5 in | Wt 183.0 lb

## 2019-09-22 DIAGNOSIS — E559 Vitamin D deficiency, unspecified: Secondary | ICD-10-CM | POA: Diagnosis not present

## 2019-09-22 DIAGNOSIS — E663 Overweight: Secondary | ICD-10-CM | POA: Diagnosis not present

## 2019-09-22 DIAGNOSIS — I480 Paroxysmal atrial fibrillation: Secondary | ICD-10-CM | POA: Diagnosis not present

## 2019-09-22 DIAGNOSIS — E7800 Pure hypercholesterolemia, unspecified: Secondary | ICD-10-CM

## 2019-09-22 DIAGNOSIS — Z79899 Other long term (current) drug therapy: Secondary | ICD-10-CM

## 2019-09-22 DIAGNOSIS — R7309 Other abnormal glucose: Secondary | ICD-10-CM

## 2019-09-22 DIAGNOSIS — I10 Essential (primary) hypertension: Secondary | ICD-10-CM | POA: Diagnosis not present

## 2019-09-22 MED ORDER — VITAMIN D (ERGOCALCIFEROL) 1.25 MG (50000 UNIT) PO CAPS: ORAL_CAPSULE | ORAL | 1 refills | Status: DC

## 2019-09-22 NOTE — Patient Instructions (Addendum)
Goals    . Blood Pressure < 130/80    . Weight (lb) < 175 lb (79.4 kg)       Please take your atenolol consistently - if stopping, we recommend tapering down rather than starting and stopping abruptly   This medication also helps with heart rhythm so very important to take unless advised otherwise by cardiology     HYPERTENSION INFORMATION  Monitor your blood pressure at home, please keep a record and bring that in with you to your next office visit.   Go to the ER if any CP, SOB, nausea, dizziness, severe HA, changes vision/speech  Testing/Procedures: HOW TO TAKE YOUR BLOOD PRESSURE:  Rest 5 minutes before taking your blood pressure.  Don't smoke or drink caffeinated beverages for at least 30 minutes before.  Take your blood pressure before (not after) you eat.  Sit comfortably with your back supported and both feet on the floor (don't cross your legs).  Elevate your arm to heart level on a table or a desk.  Use the proper sized cuff. It should fit smoothly and snugly around your bare upper arm. There should be enough room to slip a fingertip under the cuff. The bottom edge of the cuff should be 1 inch above the crease of the elbow.  Your most recent BP: BP: (!) 142/90   Take your medications faithfully as instructed. Maintain a healthy weight. Get at least 150 minutes of aerobic exercise per week. Minimize salt intake. Minimize alcohol intake  DASH Eating Plan DASH stands for "Dietary Approaches to Stop Hypertension." The DASH eating plan is a healthy eating plan that has been shown to reduce high blood pressure (hypertension). Additional health benefits may include reducing the risk of type 2 diabetes mellitus, heart disease, and stroke. The DASH eating plan may also help with weight loss. WHAT DO I NEED TO KNOW ABOUT THE DASH EATING PLAN? For the DASH eating plan, you will follow these general guidelines:  Choose foods with a percent daily value for sodium of less  than 5% (as listed on the food label).  Use salt-free seasonings or herbs instead of table salt or sea salt.  Check with your health care provider or pharmacist before using salt substitutes.  Eat lower-sodium products, often labeled as "lower sodium" or "no salt added."  Eat fresh foods.  Eat more vegetables, fruits, and low-fat dairy products.  Choose whole grains. Look for the word "whole" as the first word in the ingredient list.  Choose fish and skinless chicken or [place] more often than red meat. Limit fish, poultry, and meat to 6 oz (170 g) each day.  Limit sweets, desserts, sugars, and sugary drinks.  Choose heart-healthy fats.  Limit cheese to 1 oz (28 g) per day.  Eat more home-cooked food and less restaurant, buffet, and fast food.  Limit fried foods.  Cook foods using methods other than frying.  Limit canned vegetables. If you do use them, rinse them well to decrease the sodium.  When eating at a restaurant, ask that your food be prepared with less salt, or no salt if possible. WHAT FOODS CAN I EAT? Seek help from a dietitian for individual calorie needs. Grains Whole grain or whole wheat bread. Brown rice. Whole grain or whole wheat pasta. Quinoa, bulgur, and whole grain cereals. Low-sodium cereals. Corn or whole wheat flour tortillas. Whole grain cornbread. Whole grain crackers. Low-sodium crackers. Vegetables Fresh or frozen vegetables (raw, steamed, roasted, or grilled). Low-sodium or reduced-sodium tomato  and vegetable juices. Low-sodium or reduced-sodium tomato sauce and paste. Low-sodium or reduced-sodium canned vegetables.  Fruits All fresh, canned (in natural juice), or frozen fruits. Meat and Other Protein Products Ground beef (85% or leaner), grass-fed beef, or beef trimmed of fat. Skinless chicken or [place]. Ground chicken or [place]. Pork trimmed of fat. All fish and seafood. Eggs. Dried beans, peas, or lentils. Unsalted nuts and seeds. Unsalted canned  beans. Dairy Low-fat dairy products, such as skim or 1% milk, 2% or reduced-fat cheeses, low-fat ricotta or cottage cheese, or plain low-fat yogurt. Low-sodium or reduced-sodium cheeses. Fats and Oils Tub margarines without trans fats. Light or reduced-fat mayonnaise and salad dressings (reduced sodium). Avocado. Safflower, olive, or canola oils. Natural peanut or almond butter. Other Unsalted popcorn and pretzels. The items listed above may not be a complete list of recommended foods or beverages. Contact your dietitian for more options. WHAT FOODS ARE NOT RECOMMENDED? Grains White bread. White pasta. White rice. Refined cornbread. Bagels and croissants. Crackers that contain trans fat. Vegetables Creamed or fried vegetables. Vegetables in a cheese sauce. Regular canned vegetables. Regular canned tomato sauce and paste. Regular tomato and vegetable juices. Fruits Dried fruits. Canned fruit in light or heavy syrup. Fruit juice. Meat and Other Protein Products Fatty cuts of meat. Ribs, chicken wings, bacon, sausage, bologna, salami, chitterlings, fatback, hot dogs, bratwurst, and packaged luncheon meats. Salted nuts and seeds. Canned beans with salt. Dairy Whole or 2% milk, cream, half-and-half, and cream cheese. Whole-fat or sweetened yogurt. Full-fat cheeses or blue cheese. Nondairy creamers and whipped toppings. Processed cheese, cheese spreads, or cheese curds. Condiments Onion and garlic salt, seasoned salt, table salt, and sea salt. Canned and packaged gravies. Worcestershire sauce. Tartar sauce. Barbecue sauce. Teriyaki sauce. Soy sauce, including reduced sodium. Steak sauce. Fish sauce. Oyster sauce. Cocktail sauce. Horseradish. Ketchup and mustard. Meat flavorings and tenderizers. Bouillon cubes. Hot sauce. Tabasco sauce. Marinades. Taco seasonings. Relishes. Fats and Oils Butter, stick margarine, lard, shortening, ghee, and bacon fat. Coconut, palm kernel, or palm oils. Regular salad  dressings. Other Pickles and olives. Salted popcorn and pretzels. The items listed above may not be a complete list of foods and beverages to avoid. Contact your dietitian for more information. WHERE CAN I FIND MORE INFORMATION? National Heart, Lung, and Blood Institute: travelstabloid.com Document Released: 09/13/2011 Document Revised: 02/08/2014 Document Reviewed: 07/29/2013 [facility] Patient Information 2015 [place], [place]. This information is not intended to replace advice given to you by your health care provider. Make sure you discuss any questions you have with your health care provider.

## 2019-09-23 ENCOUNTER — Other Ambulatory Visit: Payer: Self-pay | Admitting: Adult Health

## 2019-09-23 LAB — CBC WITH DIFFERENTIAL/PLATELET
Absolute Monocytes: 425 cells/uL (ref 200–950)
Basophils Absolute: 31 cells/uL (ref 0–200)
Basophils Relative: 0.8 %
Eosinophils Absolute: 70 cells/uL (ref 15–500)
Eosinophils Relative: 1.8 %
HCT: 42.9 % (ref 35.0–45.0)
Hemoglobin: 14.4 g/dL (ref 11.7–15.5)
Lymphs Abs: 1884 cells/uL (ref 850–3900)
MCH: 30.6 pg (ref 27.0–33.0)
MCHC: 33.6 g/dL (ref 32.0–36.0)
MCV: 91.3 fL (ref 80.0–100.0)
MPV: 10.2 fL (ref 7.5–12.5)
Monocytes Relative: 10.9 %
Neutro Abs: 1490 cells/uL — ABNORMAL LOW (ref 1500–7800)
Neutrophils Relative %: 38.2 %
Platelets: 240 10*3/uL (ref 140–400)
RBC: 4.7 10*6/uL (ref 3.80–5.10)
RDW: 12.8 % (ref 11.0–15.0)
Total Lymphocyte: 48.3 %
WBC: 3.9 10*3/uL (ref 3.8–10.8)

## 2019-09-23 LAB — COMPLETE METABOLIC PANEL WITH GFR
AG Ratio: 1.5 (calc) (ref 1.0–2.5)
ALT: 14 U/L (ref 6–29)
AST: 13 U/L (ref 10–35)
Albumin: 4.4 g/dL (ref 3.6–5.1)
Alkaline phosphatase (APISO): 110 U/L (ref 37–153)
BUN: 13 mg/dL (ref 7–25)
CO2: 29 mmol/L (ref 20–32)
Calcium: 9.9 mg/dL (ref 8.6–10.4)
Chloride: 107 mmol/L (ref 98–110)
Creat: 0.74 mg/dL (ref 0.50–0.99)
GFR, Est African American: 101 mL/min/{1.73_m2} (ref >=60)
GFR, Est Non African American: 87 mL/min/{1.73_m2} (ref >=60)
Globulin: 2.9 g/dL (calc) (ref 1.9–3.7)
Glucose, Bld: 90 mg/dL (ref 65–99)
Potassium: 5 mmol/L (ref 3.5–5.3)
Sodium: 145 mmol/L (ref 135–146)
Total Bilirubin: 0.6 mg/dL (ref 0.2–1.2)
Total Protein: 7.3 g/dL (ref 6.1–8.1)

## 2019-09-23 LAB — LIPID PANEL
Cholesterol: 193 mg/dL (ref <200)
HDL: 53 mg/dL (ref >=50)
LDL Cholesterol (Calc): 124 mg/dL (calc) — ABNORMAL HIGH
Non-HDL Cholesterol (Calc): 140 mg/dL (calc) — ABNORMAL HIGH (ref <130)
Total CHOL/HDL Ratio: 3.6 (calc) (ref <5.0)
Triglycerides: 68 mg/dL (ref <150)

## 2019-09-23 LAB — TSH: TSH: 2.42 mIU/L (ref 0.40–4.50)

## 2019-09-23 LAB — MAGNESIUM: Magnesium: 2.3 mg/dL (ref 1.5–2.5)

## 2019-09-23 MED ORDER — EZETIMIBE 10 MG PO TABS: 10.0000 mg | ORAL_TABLET | Freq: Every day | ORAL | 1 refills | Status: DC

## 2019-10-06 ENCOUNTER — Ambulatory Visit (INDEPENDENT_AMBULATORY_CARE_PROVIDER_SITE_OTHER): Payer: BC Managed Care – PPO

## 2019-10-06 ENCOUNTER — Other Ambulatory Visit: Payer: Self-pay

## 2019-10-06 VITALS — BP 136/80 | HR 78 | Temp 97.7°F | Wt 184.0 lb

## 2019-10-06 DIAGNOSIS — Z79899 Other long term (current) drug therapy: Secondary | ICD-10-CM | POA: Diagnosis not present

## 2019-10-06 NOTE — Progress Notes (Signed)
PATIENT reports for BLOOD PRESSURE recheck.  Patient states she is taking her meds as follows & reports no issues at this time. Vitals were entered into epic.

## 2019-10-19 ENCOUNTER — Ambulatory Visit: Payer: BC Managed Care – PPO | Attending: Internal Medicine

## 2019-10-19 DIAGNOSIS — Z20822 Contact with and (suspected) exposure to covid-19: Secondary | ICD-10-CM | POA: Diagnosis not present

## 2019-10-21 LAB — NOVEL CORONAVIRUS, NAA: SARS-CoV-2, NAA: NOT DETECTED

## 2019-11-03 DIAGNOSIS — H40013 Open angle with borderline findings, low risk, bilateral: Secondary | ICD-10-CM | POA: Diagnosis not present

## 2019-11-03 DIAGNOSIS — H25013 Cortical age-related cataract, bilateral: Secondary | ICD-10-CM | POA: Diagnosis not present

## 2019-11-09 DIAGNOSIS — N200 Calculus of kidney: Secondary | ICD-10-CM

## 2019-11-11 NOTE — Progress Notes (Signed)
Subjective:    Patient ID: Alyssa Dudley, female    DOB: 1956/09/25, 64 y.o.   MRN: [id number]  HPI 64 y.o. AAF with history of PUD, GERD, Afib, on bASA presents with AB pain.  Patient had right AB pain in 06/2019, had CT that showed renal calculus, she has messaged [place] saying she is still in pain. For the last month the entire lower AB is tender sore and feels warm, last 2 weeks she feels like when she PUD.  She took zetia x 1 week or two but has been off of it. She has cut back on cheese, fried foods.   IMPRESSION: Nonobstructive right renal calculus. No hydronephrosis or renal obstruction is noted.  Stable left renal cyst.  No other abnormality seen in the abdomen or pelvis.  Blood pressure (!) 150/90, pulse 61, temperature (!) 97.3 F (36.3 C), height 5' 5.5" (1.664 m), weight 183 lb (83 kg), SpO2 99 %.  Medications   Current Outpatient Medications (Cardiovascular):  .  atenolol (TENORMIN) 25 MG tablet, Take 1 tablet Daily for BP (Patient taking differently: as needed. Take 1 tablet Daily for BP) .  ezetimibe (ZETIA) 10 MG tablet, Take 1 tablet (10 mg total) by mouth daily.   Current Outpatient Medications (Analgesics):  .  aspirin EC 81 MG tablet, Take 81 mg by mouth daily.   Current Outpatient Medications (Other):  .  diphenhydramine-acetaminophen (TYLENOL PM) 25-500 MG TABS tablet, Take 1 tablet by mouth at bedtime as needed (pain/sleep).  .  triamcinolone cream (KENALOG) 0.1 %, Apply 1 application topically 3 (three) times daily as needed (rash). .  Vitamin D, Ergocalciferol, (DRISDOL) 1.25 MG (50000 UT) CAPS capsule, TAKE 1 CAPSULE BY MOUTH 2 TIMES A WEEK  Problem list She has GERD; Constipation; History of peptic ulcer disease; Family history of malignant neoplasm of gastrointestinal tract; Benign neoplasm of colon; Mobitz type 2 second degree and high-grade heart block; Hyperlipidemia; Vitamin D deficiency; Atrial fibrillation ([place]); HTN (hypertension); Abnormal  glucose; Medication management; Overweight (BMI 25.0-29.9); Meralgia paraesthetica, left; Plantar fasciitis of left foot; and Lateral epicondylitis of left elbow on their problem list.   Review of Systems See HPI    Objective:   Physical Exam Constitutional:      Appearance: She is well-developed.  HENT:     Head: Normocephalic and atraumatic.     Right Ear: External ear normal.     Left Ear: External ear normal.  Eyes:     Conjunctiva/sclera: Conjunctivae normal.     Pupils: Pupils are equal, round, and reactive to light.  Neck:     Thyroid: No thyromegaly.  Cardiovascular:     Rate and Rhythm: Normal rate and regular rhythm.     Heart sounds: Normal heart sounds. No murmur. No friction rub. No gallop.   Pulmonary:     Effort: Pulmonary effort is normal. No respiratory distress.     Breath sounds: Normal breath sounds. No wheezing.  Abdominal:     General: Bowel sounds are normal. There is distension. There is no abdominal bruit.     Palpations: Abdomen is soft. Abdomen is not rigid. There is no shifting dullness, mass or pulsatile mass.     Tenderness: There is abdominal tenderness in the epigastric area and left lower quadrant. There is guarding. There is no rebound. Negative signs include Murphy's sign and McBurney's sign.     Hernia: No hernia is present.  Musculoskeletal:        General: Normal range of  motion.     Cervical back: Normal range of motion and neck supple.  Lymphadenopathy:     Cervical: No cervical adenopathy.  Skin:    General: Skin is warm and dry.  Neurological:     Mental Status: She is alert and oriented to person, place, and time.        Assessment & Plan:  Zhana was seen today for abdominal pain.  Diagnoses and all orders for this visit:  LLQ pain -     CBC with Differential/Platelet -     COMPLETE METABOLIC PANEL WITH GFR -     tamsulosin (FLOMAX) 0.4 MG CAPS capsule; Take 1 capsule (0.4 mg total) by mouth at bedtime. -      ciprofloxacin (CIPRO) 500 MG tablet; Take 1 tablet (500 mg total) by mouth 2 (two) times daily for 7 days. -     metroNIDAZOLE (FLAGYL) 500 MG tablet; Take 1 tablet (500 mg total) by mouth 3 (three) times daily for 7 days. - possible diverticulitis with LLQ pain and constipation- will treat - recent CT AB so will not get at this time but if pain changes will repeat Please go to the ER if you have any severe AB pain, unable to hold down food/water, blood in stool or vomit, chest pain, shortness of breath, or any worsening symptoms.   Epigastric abdominal pain -     Amylase -     Lipase - check H pylori, add on protonix  Urinary frequency -     Urinalysis, Routine w reflex microscopic -     Urine Culture  Hypertension Continue atenolol for now, monitor at home, add on flomax at night Has close follow up  Future Appointments  Date Time Provider [place]  12/09/2019  9:55 AM [facility]  01/05/2020  3:45 PM Vicie Mutters, PA-C [facility] None  03/09/2020  9:55 AM [facility]OT[facility]  03/28/2020 10:00 AM Vicie Mutters, PA-C [facility] None  06/08/2020  9:55 AM [facility]  09/07/2020  9:55 AM [facility]

## 2019-11-12 ENCOUNTER — Ambulatory Visit (INDEPENDENT_AMBULATORY_CARE_PROVIDER_SITE_OTHER): Payer: BC Managed Care – PPO | Admitting: Physician Assistant

## 2019-11-12 ENCOUNTER — Other Ambulatory Visit: Payer: Self-pay

## 2019-11-12 ENCOUNTER — Encounter: Payer: Self-pay | Admitting: Physician Assistant

## 2019-11-12 VITALS — BP 150/90 | HR 61 | Temp 97.3°F | Ht 65.5 in | Wt 183.0 lb

## 2019-11-12 DIAGNOSIS — R350 Frequency of micturition: Secondary | ICD-10-CM | POA: Diagnosis not present

## 2019-11-12 DIAGNOSIS — R1032 Left lower quadrant pain: Secondary | ICD-10-CM

## 2019-11-12 DIAGNOSIS — R1013 Epigastric pain: Secondary | ICD-10-CM

## 2019-11-12 DIAGNOSIS — I10 Essential (primary) hypertension: Secondary | ICD-10-CM

## 2019-11-12 MED ORDER — CIPROFLOXACIN HCL 500 MG PO TABS: 500.0000 mg | ORAL_TABLET | Freq: Two times a day (BID) | ORAL | 0 refills | Status: AC

## 2019-11-12 MED ORDER — METRONIDAZOLE 500 MG PO TABS: 500.0000 mg | ORAL_TABLET | Freq: Three times a day (TID) | ORAL | 0 refills | Status: AC

## 2019-11-12 MED ORDER — PANTOPRAZOLE SODIUM 40 MG PO TBEC: 40.0000 mg | DELAYED_RELEASE_TABLET | Freq: Every day | ORAL | 1 refills | Status: DC

## 2019-11-12 MED ORDER — TAMSULOSIN HCL 0.4 MG PO CAPS: 0.4000 mg | ORAL_CAPSULE | Freq: Every day | ORAL | 1 refills | Status: DC

## 2019-11-12 NOTE — Patient Instructions (Signed)
Start on cipro/flaygl for diverticulitis Do liquid diet for a few days with jello/brothes then go to bowel rest Bowel rest means no wheat/fiber, so please eat white stuff like potatoes, soup, crackers until you are feeling better and then slowly advance your diet back to veggies and fiber like wheat.   Please go to the ER if you have any severe AB pain, unable to hold down food/water, blood in stool or vomit, chest pain, shortness of breath, or any worsening symptoms.   Diverticulitis Diverticulitis is inflammation or infection of small pouches in your colon that form when you have a condition called diverticulosis. The pouches in your colon are called diverticula. Your colon, or large intestine, is where water is absorbed and stool is formed. Complications of diverticulitis can include:  Bleeding.  Severe infection.  Severe pain.  Perforation of your colon.  Obstruction of your colon.  What are the causes? Diverticulitis is caused by bacteria. Diverticulitis happens when stool becomes trapped in diverticula. This allows bacteria to grow in the diverticula, which can lead to inflammation and infection. What increases the risk? People with diverticulosis are at risk for diverticulitis. Eating a diet that does not include enough fiber from fruits and vegetables may make diverticulitis more likely to develop. What are the signs or symptoms? Symptoms of diverticulitis may include:  Abdominal pain and tenderness. The pain is normally located on the left side of the abdomen, but may occur in other areas.  Fever and chills.  Bloating.  Cramping.  Nausea.  Vomiting.  Constipation.  Diarrhea.  Blood in your stool.  How is this diagnosed? Your health care provider will ask you about your medical history and do a physical exam. You may need to have tests done because many medical conditions can cause the same symptoms as diverticulitis. Tests may include:  Blood tests.  Urine  tests.  Imaging tests of the abdomen, including X-rays and CT scans.  When your condition is under control, your health care provider may recommend that you have a colonoscopy. A colonoscopy can show how severe your diverticula are and whether something else is causing your symptoms. How is this treated? Most cases of diverticulitis are mild and can be treated at home. Treatment may include:  Taking over-the-counter pain medicines.  Following a clear liquid diet.  Taking antibiotic medicines by mouth for 7-10 days.  More severe cases may be treated at a hospital. Treatment may include:  Not eating or drinking.  Taking prescription pain medicine.  Receiving antibiotic medicines through an IV tube.  Receiving fluids and nutrition through an IV tube.  Surgery.  Follow these instructions at home:  Follow your health care provider's instructions carefully.  Follow a full liquid diet or other diet as directed by your health care provider. After your symptoms improve, your health care provider may tell you to change your diet. He or she may recommend you eat a high-fiber diet. Fruits and vegetables are good sources of fiber. Fiber makes it easier to pass stool.  Take fiber supplements or probiotics as directed by your health care provider.  Only take medicines as directed by your health care provider.  Keep all your follow-up appointments. Contact a health care provider if:  Your pain does not improve.  You have a hard time eating food.  Your bowel movements do not return to normal. Get help right away if:  Your pain becomes worse.  Your symptoms do not get better.  Your symptoms suddenly get worse.  You have a fever.  You have repeated vomiting.  You have bloody or black, tarry stools. This information is not intended to replace advice given to you by your health care provider. Make sure you discuss any questions you have with your health care provider. Document  Released: 07/04/2005 Document Revised: 03/01/2016 Document Reviewed: 08/19/2013 Elsevier Interactive Patient Education  2017 [employer].

## 2019-11-13 LAB — COMPLETE METABOLIC PANEL WITH GFR
AG Ratio: 1.5 (calc) (ref 1.0–2.5)
ALT: 13 U/L (ref 6–29)
AST: 14 U/L (ref 10–35)
Albumin: 4.2 g/dL (ref 3.6–5.1)
Alkaline phosphatase (APISO): 108 U/L (ref 37–153)
BUN: 10 mg/dL (ref 7–25)
CO2: 30 mmol/L (ref 20–32)
Calcium: 9.7 mg/dL (ref 8.6–10.4)
Chloride: 108 mmol/L (ref 98–110)
Creat: 0.75 mg/dL (ref 0.50–0.99)
GFR, Est African American: 99 mL/min/{1.73_m2} (ref >=60)
GFR, Est Non African American: 85 mL/min/{1.73_m2} (ref >=60)
Globulin: 2.8 g/dL (calc) (ref 1.9–3.7)
Glucose, Bld: 88 mg/dL (ref 65–99)
Potassium: 4.7 mmol/L (ref 3.5–5.3)
Sodium: 145 mmol/L (ref 135–146)
Total Bilirubin: 0.7 mg/dL (ref 0.2–1.2)
Total Protein: 7 g/dL (ref 6.1–8.1)

## 2019-11-13 LAB — CBC WITH DIFFERENTIAL/PLATELET
Absolute Monocytes: 511 cells/uL (ref 200–950)
Basophils Absolute: 32 cells/uL (ref 0–200)
Basophils Relative: 0.7 %
Eosinophils Absolute: 101 cells/uL (ref 15–500)
Eosinophils Relative: 2.2 %
HCT: 41.2 % (ref 35.0–45.0)
Hemoglobin: 14.1 g/dL (ref 11.7–15.5)
Lymphs Abs: 2107 cells/uL (ref 850–3900)
MCH: 31 pg (ref 27.0–33.0)
MCHC: 34.2 g/dL (ref 32.0–36.0)
MCV: 90.5 fL (ref 80.0–100.0)
MPV: 10.3 fL (ref 7.5–12.5)
Monocytes Relative: 11.1 %
Neutro Abs: 1849 cells/uL (ref 1500–7800)
Neutrophils Relative %: 40.2 %
Platelets: 244 10*3/uL (ref 140–400)
RBC: 4.55 10*6/uL (ref 3.80–5.10)
RDW: 12.8 % (ref 11.0–15.0)
Total Lymphocyte: 45.8 %
WBC: 4.6 10*3/uL (ref 3.8–10.8)

## 2019-11-13 LAB — URINALYSIS, ROUTINE W REFLEX MICROSCOPIC
Bilirubin Urine: NEGATIVE
Glucose, UA: NEGATIVE
Hgb urine dipstick: NEGATIVE
Ketones, ur: NEGATIVE
Leukocytes,Ua: NEGATIVE
Nitrite: NEGATIVE
Protein, ur: NEGATIVE
Specific Gravity, Urine: 1.016 (ref 1.001–1.03)
pH: >=8.5 — AB (ref 5.0–8.0)

## 2019-11-13 LAB — URINE CULTURE
MICRO NUMBER:: 10117572
Result:: NO GROWTH
SPECIMEN QUALITY:: ADEQUATE

## 2019-11-13 LAB — LIPASE: Lipase: 13 U/L (ref 7–60)

## 2019-11-13 LAB — AMYLASE: Amylase: 17 U/L — ABNORMAL LOW (ref 21–101)

## 2019-11-13 LAB — H. PYLORI BREATH TEST: H. pylori Breath Test: NOT DETECTED

## 2019-12-05 ENCOUNTER — Ambulatory Visit: Payer: BC Managed Care – PPO | Attending: Internal Medicine

## 2019-12-05 DIAGNOSIS — Z23 Encounter for immunization: Secondary | ICD-10-CM | POA: Insufficient documentation

## 2019-12-05 NOTE — Progress Notes (Signed)
   Covid-19 Vaccination Clinic  Name:  Alyssa Dudley    MRN: [id number] DOB: 03-28-1956  12/05/2019  [name] was observed post Covid-19 immunization for 15 minutes without incidence. She was provided with Vaccine Information Sheet and instruction to access the V-Safe system.   [name] was instructed to call 911 with any severe reactions post vaccine: Marland Kitchen Difficulty breathing  . Swelling of your face and throat  . A fast heartbeat  . A bad rash all over your body  . Dizziness and weakness    Immunizations Administered    Name Date Dose VIS Date Route   Pfizer COVID-19 Vaccine 12/05/2019  1:00 PM 0.3 mL 09/18/2019 Intramuscular   Manufacturer: [place]   Lot: [id number]   [place]: [id number]

## 2019-12-07 ENCOUNTER — Other Ambulatory Visit: Payer: Self-pay | Admitting: *Deleted

## 2019-12-07 MED ORDER — ATENOLOL 25 MG PO TABS: ORAL_TABLET | ORAL | 1 refills | Status: DC

## 2019-12-10 DIAGNOSIS — N200 Calculus of kidney: Secondary | ICD-10-CM | POA: Diagnosis not present

## 2019-12-11 ENCOUNTER — Other Ambulatory Visit: Payer: Self-pay | Admitting: Urology

## 2019-12-11 DIAGNOSIS — N200 Calculus of kidney: Secondary | ICD-10-CM

## 2019-12-17 ENCOUNTER — Other Ambulatory Visit (HOSPITAL_COMMUNITY)
Admission: RE | Admit: 2019-12-17 | Discharge: 2019-12-17 | Disposition: A | Discharge status: Home / Self Care | Payer: BC Managed Care – PPO | Source: Ambulatory Visit | Attending: Urology | Admitting: Urology

## 2019-12-17 DIAGNOSIS — Z20822 Contact with and (suspected) exposure to covid-19: Secondary | ICD-10-CM | POA: Insufficient documentation

## 2019-12-17 DIAGNOSIS — Z01812 Encounter for preprocedural laboratory examination: Secondary | ICD-10-CM | POA: Diagnosis not present

## 2019-12-17 LAB — SARS CORONAVIRUS 2 (TAT 6-24 HRS): SARS Coronavirus 2: NEGATIVE

## 2019-12-18 ENCOUNTER — Telehealth: Payer: Self-pay

## 2019-12-18 NOTE — Telephone Encounter (Signed)
I tried to help the pt send a transmission but she was not home. She said she will see Jonni Sanger Tuesday and he can do whatever needs to be done then. I gave her the number to [place] support to get additional help if needed.

## 2019-12-21 ENCOUNTER — Other Ambulatory Visit: Payer: Self-pay | Admitting: Urology

## 2019-12-21 ENCOUNTER — Encounter (HOSPITAL_BASED_OUTPATIENT_CLINIC_OR_DEPARTMENT_OTHER): Admission: RE | Payer: Self-pay | Source: Ambulatory Visit

## 2019-12-21 ENCOUNTER — Ambulatory Visit (INDEPENDENT_AMBULATORY_CARE_PROVIDER_SITE_OTHER): Payer: BC Managed Care – PPO | Admitting: *Deleted

## 2019-12-21 ENCOUNTER — Ambulatory Visit (HOSPITAL_BASED_OUTPATIENT_CLINIC_OR_DEPARTMENT_OTHER): Admission: RE | Admit: 2019-12-21 | Payer: BC Managed Care – PPO | Source: Ambulatory Visit | Admitting: Urology

## 2019-12-21 DIAGNOSIS — I441 Atrioventricular block, second degree: Secondary | ICD-10-CM

## 2019-12-21 SURGERY — LITHOTRIPSY, ESWL
Anesthesia: LOCAL | Laterality: Right

## 2019-12-22 ENCOUNTER — Other Ambulatory Visit: Payer: Self-pay

## 2019-12-22 ENCOUNTER — Ambulatory Visit: Payer: BC Managed Care – PPO | Admitting: Student

## 2019-12-22 ENCOUNTER — Encounter: Payer: Self-pay | Admitting: Student

## 2019-12-22 VITALS — BP 152/94 | HR 60 | Ht 65.5 in | Wt 183.6 lb

## 2019-12-22 DIAGNOSIS — I441 Atrioventricular block, second degree: Secondary | ICD-10-CM | POA: Diagnosis not present

## 2019-12-22 DIAGNOSIS — I480 Paroxysmal atrial fibrillation: Secondary | ICD-10-CM | POA: Diagnosis not present

## 2019-12-22 LAB — CUP PACEART INCLINIC DEVICE CHECK
Battery Remaining Longevity: 122 mo
Battery Voltage: 2.95 V
Brady Statistic RA Percent Paced: 45 %
Brady Statistic RV Percent Paced: 99.84 %
Date Time Interrogation Session: 20210316115029
Implantable Lead Implant Date: 20140408
Implantable Lead Implant Date: 20140408
Implantable Lead Location: 753859
Implantable Lead Location: 753860
Implantable Lead Model: 1944
Implantable Lead Model: 1948
Implantable Pulse Generator Implant Date: 20140408
Lead Channel Impedance Value: 537.5 Ohm
Lead Channel Impedance Value: 662.5 Ohm
Lead Channel Pacing Threshold Amplitude: 0.5 V
Lead Channel Pacing Threshold Amplitude: 0.625 V
Lead Channel Pacing Threshold Pulse Width: 0.4 ms
Lead Channel Pacing Threshold Pulse Width: 0.4 ms
Lead Channel Sensing Intrinsic Amplitude: 12 mV
Lead Channel Sensing Intrinsic Amplitude: 4.1 mV
Lead Channel Setting Pacing Amplitude: 0.875
Lead Channel Setting Pacing Amplitude: 1.5 V
Lead Channel Setting Pacing Pulse Width: 0.4 ms
Lead Channel Setting Sensing Sensitivity: 2 mV
Pulse Gen Model: 2210
Pulse Gen Serial Number: 7457437

## 2019-12-22 LAB — CUP PACEART REMOTE DEVICE CHECK
Battery Remaining Longevity: 125 mo
Battery Remaining Percentage: 91 %
Battery Voltage: 2.95 V
Brady Statistic AP VP Percent: 45 %
Brady Statistic AP VS Percent: 1 %
Brady Statistic AS VP Percent: 55 %
Brady Statistic AS VS Percent: 1 %
Brady Statistic RA Percent Paced: 44 %
Brady Statistic RV Percent Paced: 99 %
Date Time Interrogation Session: 20210312203925
Implantable Lead Implant Date: 20140408
Implantable Lead Implant Date: 20140408
Implantable Lead Location: 753859
Implantable Lead Location: 753860
Implantable Lead Model: 1944
Implantable Lead Model: 1948
Implantable Pulse Generator Implant Date: 20140408
Lead Channel Impedance Value: 540 Ohm
Lead Channel Impedance Value: 660 Ohm
Lead Channel Pacing Threshold Amplitude: 0.375 V
Lead Channel Pacing Threshold Amplitude: 0.5 V
Lead Channel Pacing Threshold Pulse Width: 0.4 ms
Lead Channel Pacing Threshold Pulse Width: 0.4 ms
Lead Channel Sensing Intrinsic Amplitude: 12 mV
Lead Channel Sensing Intrinsic Amplitude: 4.5 mV
Lead Channel Setting Pacing Amplitude: 0.75 V
Lead Channel Setting Pacing Amplitude: 1.375
Lead Channel Setting Pacing Pulse Width: 0.4 ms
Lead Channel Setting Sensing Sensitivity: 2 mV
Pulse Gen Model: 2210
Pulse Gen Serial Number: 7457437

## 2019-12-22 NOTE — Progress Notes (Signed)
Electrophysiology Office Note Date: 12/22/2019  ID:  Alyssa Dudley, Alyssa Dudley Aug 23, 1956, MRN [id number]  PCP: Unk Pinto, MD Electrophysiologist: Dr. [name]  CC: Pacemaker follow-up  Alyssa Dudley is a 64 y.o. female seen today for Dr. [name] . she presents today for cardiac clearance for R lithotripsy  Since last being seen in our clinic, the patient reports doing very well.  she denies chest pain, palpitations, dyspnea, PND, orthopnea, nausea, vomiting, dizziness, syncope, edema, weight gain, or early satiety.   She is tolerating her medications well and currently has no complaints.  Device History: St. Jude Dual Chamber PPM implanted 2014 for Mobitz II  Past Medical History:  Diagnosis Date  . Allergy    once a year   . Hyperlipidemia    borderline- no meds   . Pacemaker   . Paroxysmal atrial fibrillation ([place]) 6/15   detected on ppm interrogation  . PONV (postoperative nausea and vomiting)    has vomitted in past  . Second degree Mobitz II AV block   . Vitamin D deficiency    Past Surgical History:  Procedure Laterality Date  . BREAST CYST EXCISION  1980  . COLONOSCOPY    . PERMANENT PACEMAKER INSERTION N/A 01/13/2013   SJM Accent DR RF implanted by Dr [name] for mobitz II AV block  . PLANTAR FASCIECTOMY    . POLYPECTOMY    . WRIST SURGERY Right 2011   removed cyst    Current Outpatient Medications  Medication Sig Dispense Refill  . aspirin EC 81 MG tablet Take 81 mg by mouth daily.    Marland Kitchen atenolol (TENORMIN) 25 MG tablet Take 1 tablet Daily for BP 90 tablet 1  . diphenhydramine-acetaminophen (TYLENOL PM) 25-500 MG TABS tablet Take 1 tablet by mouth at bedtime as needed (pain/sleep).     . triamcinolone cream (KENALOG) 0.1 % Apply 1 application topically 3 (three) times daily as needed (rash). 30 g 3  . Vitamin D, Ergocalciferol, (DRISDOL) 1.25 MG (50000 UT) CAPS capsule TAKE 1 CAPSULE BY MOUTH 2 TIMES A WEEK 30 capsule 1   No current facility-administered  medications for this visit.    Allergies:   Patient has no known allergies.   Social History: Social History   Socioeconomic History  . Marital status: Single    Spouse name: Not on file  . Number of children: 1  . Years of education: Not on file  . Highest education level: Not on file  Occupational History  . Not on file  Tobacco Use  . Smoking status: Never Smoker  . Smokeless tobacco: Never Used  Substance and Sexual Activity  . Alcohol use: No  . Drug use: No  . Sexual activity: Not Currently  Other Topics Concern  . Not on file  Social History Narrative  . Not on file   Social Determinants of Health   Financial Resource Strain:   . Difficulty of Paying Living Expenses:   Food Insecurity:   . Worried About Charity fundraiser in the Last Year:   . Arboriculturist in the Last Year:   Transportation Needs:   . Film/video editor (Medical):   Marland Kitchen Lack of Transportation (Non-Medical):   Physical Activity:   . Days of Exercise per Week:   . Minutes of Exercise per Session:   Stress:   . Feeling of Stress :   Social Connections:   . Frequency of Communication with Friends and Family:   . Frequency of Social Gatherings  with Friends and Family:   . Attends Religious Services:   . Active Member of Clubs or Organizations:   . Attends Archivist Meetings:   Marland Kitchen Marital Status:   Intimate Partner Violence:   . Fear of Current or Ex-Partner:   . Emotionally Abused:   Marland Kitchen Physically Abused:   . Sexually Abused:     Family History: Family History  Problem Relation Age of Onset  . Colon cancer Sister 77  . Anemia Sister   . Heart disease Father   . Hyperlipidemia Father   . Prostate cancer Brother 47  . Multiple myeloma Son 70  . Hypertension Son   . Kidney disease Son   . Multiple myeloma Maternal Aunt   . Cervical cancer Mother   . Hypertension Mother   . Diabetes Mother   . HIV/AIDS Brother   . Colon polyps Neg Hx   . Rectal cancer Neg Hx   .  Stomach cancer Neg Hx      Review of Systems: All other systems reviewed and are otherwise negative except as noted above.  Physical Exam: Vitals:   12/22/19 1105  BP: (!) 152/94  Pulse: 60  SpO2: 95%  Weight: 183 lb 9.6 oz (83.3 kg)  Height: 5' 5.5" (1.664 m)     GEN- The patient is well appearing, alert and oriented x 3 today.   HEENT: normocephalic, atraumatic; sclera clear, conjunctiva pink; hearing intact; oropharynx clear; neck supple  Lungs- Clear to ausculation bilaterally, normal work of breathing.  No wheezes, rales, rhonchi Heart- Regular rate and rhythm, no murmurs, rubs or gallops  GI- soft, non-tender, non-distended, bowel sounds present  Extremities- no clubbing, cyanosis, or edema  MS- no significant deformity or atrophy Skin- warm and dry, no rash or lesion; PPM pocket well healed Psych- euthymic mood, full affect Neuro- strength and sensation are intact  PPM Interrogation- reviewed in detail today,  See PACEART report  EKG:  EKG is ordered today. The ekg ordered today shows AV dual pacing at 60 bpm with RBBB and QRS of 190 ms (personally reviewed)  Recent Labs: 09/22/2019: Magnesium 2.3; TSH 2.42 11/12/2019: ALT 13; BUN 10; Creat 0.75; Hemoglobin 14.1; Platelets 244; Potassium 4.7; Sodium 145   Wt Readings from Last 3 Encounters:  12/22/19 183 lb 9.6 oz (83.3 kg)  11/12/19 183 lb (83 kg)  10/06/19 184 lb (83.5 kg)     Other studies Reviewed: Additional studies/ records that were reviewed today include:  Previous EP office notes, Previous remote checks, Most recent labwork.   Assessment and Plan:  1. Advanced AV block s/p St. Jude PPM  Normal PPM function See Pace Art report No changes today  2. Paroxysmal atrial fibrillation Very infrequent and short episodes CHA2DS2VASC of 2, one of those being female, so no current indication for [facility] at this time, especially with SCAF.     3. Cardiac Clearance For right extracorporeal lithotripsy currently  scheduled for 12/28/2019 She is cleared from a cardiac perspective with a relatively low risk of peri-operative cardiac complications.  She is dependent so St Jude will need to be present for this procedure to help prevent over-sensing.   Current medicines are reviewed at length with the patient today.   The patient does not have concerns regarding her medicines.  The following changes were made today:  none  Labs/ tests ordered today include:  Orders Placed This Encounter  Procedures  . EKG 12-Lead    Disposition:   Follow up with  EP APP in 12 Months   Signed, Annamaria Helling  12/22/2019 11:33 AM  [facility] [address] [place] [address] [phone] (office) [phone] (fax)

## 2019-12-22 NOTE — Patient Instructions (Signed)
Medication Instructions:  none *If you need a refill on your cardiac medications before your next appointment, please call your pharmacy*   Lab Work: none If you have labs (blood work) drawn today and your tests are completely normal, you will receive your results only by: Marland Kitchen MyChart Message (if you have MyChart) OR . A paper copy in the mail If you have any lab test that is abnormal or we need to change your treatment, we will call you to review the results.   Testing/Procedures: none   Follow-Up: At [facility], you and your health needs are our priority.  As part of our continuing mission to provide you with exceptional heart care, we have created designated Provider Care Teams.  These Care Teams include your primary Cardiologist (physician) and Advanced Practice Providers (APPs -  Physician Assistants and Nurse Practitioners) who all work together to provide you with the care you need, when you need it.  We recommend signing up for the patient portal called "MyChart".  Sign up information is provided on this After Visit Summary.  MyChart is used to connect with patients for Virtual Visits (Telemedicine).  Patients are able to view lab/test results, encounter notes, upcoming appointments, etc.  Non-urgent messages can be sent to your provider as well.   To learn more about what you can do with MyChart, go to [email or web address].    Your next appointment:   1 year(s)  The format for your next appointment:   In Person  Provider:   EP    Other Instructions Remote monitoring is used to monitor your Pacemaker  from home. This monitoring reduces the number of office visits required to check your device to one time per year. It allows [place] to keep an eye on the functioning of your device to ensure it is working properly. You are scheduled for a device check from home on 03/21/20. You may send your transmission at any time that day. If you have a wireless device, the transmission will  be sent automatically. After your physician reviews your transmission, you will receive a postcard with your next transmission date.

## 2019-12-22 NOTE — Progress Notes (Signed)
PPM Remote  

## 2019-12-24 ENCOUNTER — Other Ambulatory Visit (HOSPITAL_COMMUNITY)
Admission: RE | Admit: 2019-12-24 | Discharge: 2019-12-24 | Disposition: A | Discharge status: Home / Self Care | Payer: BC Managed Care – PPO | Source: Ambulatory Visit | Attending: Urology | Admitting: Urology

## 2019-12-24 DIAGNOSIS — Z20822 Contact with and (suspected) exposure to covid-19: Secondary | ICD-10-CM | POA: Diagnosis not present

## 2019-12-24 DIAGNOSIS — Z01812 Encounter for preprocedural laboratory examination: Secondary | ICD-10-CM | POA: Diagnosis not present

## 2019-12-24 LAB — SARS CORONAVIRUS 2 (TAT 6-24 HRS): SARS Coronavirus 2: NEGATIVE

## 2019-12-24 NOTE — H&P (Signed)
Office Visit Report     12/10/2019   --------------------------------------------------------------------------------   Alyssa Dudley  MRN: [id number]  DOB: [birth year], 64 year old Female  SSN: [id number]   PRIMARY CARE:  Unk Pinto, MD  REFERRING:  Unk Pinto, MD  PROVIDER:  Ellison Hughs, M.D.  LOCATION:  [facility] [phone]     --------------------------------------------------------------------------------   CC/HPI: CC: Kidney stone   HPI: Alyssa Dudley is a 64 year old female, with a history of kidney stones, who was found to have a 7 mm right lower pole renal stone on CT from 06/2019 during a work-up for RLQ pain. Today, she reports intermittent episodes of right RLQ discomfort. Denies nausea, vomiting, fever, chills, dysuria or hematuria.   -Hx of RBBB and has a pacemaker in place  -s/p ESWL in 2017  -[employer] fan     ALLERGIES: No Allergies    MEDICATIONS: Tylenol CAPS Oral  Vitamin D 1,250 mcg (50,000 unit) capsule Oral     GU PSH: ESWL - 2017       PSH Notes: Renal Lithotripsy, Pacemaker Placement   NON-GU PSH: None   GU PMH: Renal calculus, Nephrolithiasis - 2017, Right nephrolithiasis, - 2017 Abdominal Pain Unspec, Acute left flank pain - 2017 Acute Cystitis/UTI, Acute cystitis without hematuria - 2017    NON-GU PMH: Encounter for general adult medical examination without abnormal findings, Encounter for preventive health examination - 2017 Personal history of other diseases of the circulatory system, History of atrial fibrillation - 2017    FAMILY HISTORY: 1 Daughter - Daughter 1 son - Son Colon Cancer - Runs In Family Deceased - Runs In Family human immunodeficiency virus infection - Runs In Family malignant neoplasm of cervix - Runs In Family malignant neoplasm of prostate - Runs In Family multiple myeloma - Runs In Family Tuberculosis - Runs In Family   SOCIAL HISTORY: None    Notes: Caffeine use, No  alcohol use, Occupation, Single, Never a smoker, Number of children   REVIEW OF SYSTEMS:    GU Review Female:   Patient reports get up at night to urinate and leakage of urine. Patient denies frequent urination, hard to postpone urination, burning /pain with urination, stream starts and stops, trouble starting your stream, have to strain to urinate, and being pregnant.  Gastrointestinal (Upper):   Patient denies nausea, vomiting, and indigestion/ heartburn.  Gastrointestinal (Lower):   Patient denies diarrhea and constipation.  Constitutional:   Patient denies fever, night sweats, weight loss, and fatigue.  Skin:   Patient denies skin rash/ lesion and itching.  Eyes:   Patient denies blurred vision and double vision.  Ears/ Nose/ Throat:   Patient denies sore throat and sinus problems.  Hematologic/Lymphatic:   Patient denies swollen glands and easy bruising.  Cardiovascular:   Patient denies leg swelling and chest pains.  Respiratory:   Patient denies cough and shortness of breath.  Endocrine:   Patient denies excessive thirst.  Musculoskeletal:   Patient denies back pain and joint pain.  Neurological:   Patient denies headaches and dizziness.  Psychologic:   Patient denies depression and anxiety.   Notes: Pt c/o pain lower back (right)     VITAL SIGNS:      12/10/2019 01:41 PM  Weight 180 lb / 81.65 kg  Height 63 in / 160.02 cm  BP 128/82 mmHg  Heart Rate 61 /min  Temperature 97.7 F / 36.5 C  BMI 31.9 kg/m   MULTI-SYSTEM PHYSICAL EXAMINATION:  Constitutional: Well-nourished. No physical deformities. Normally developed. Good grooming.  Neurologic / Psychiatric: Oriented to time, oriented to place, oriented to person. No depression, no anxiety, no agitation.  Musculoskeletal: Normal gait and station of head and neck.     PAST DATA REVIEWED:  Source Of History:  Patient  X-Ray Review: C.T. Abdomen/Pelvis: Reviewed Films. Reviewed Report. Discussed With Patient.    Notes:                      CLINICAL DATA: Right lower quadrant abdominal pain for 1 month.     EXAM:  CT ABDOMEN AND PELVIS WITHOUT CONTRAST     TECHNIQUE:  Multidetector CT imaging of the abdomen and pelvis was performed  following the standard protocol without IV contrast.     COMPARISON: CT scan of April 05, 2011.     FINDINGS:  Lower chest: No acute abnormality.     Hepatobiliary: No focal liver abnormality is seen. No gallstones,  gallbladder wall thickening, or biliary dilatation.     Pancreas: Unremarkable. No pancreatic ductal dilatation or  surrounding inflammatory changes.     Spleen: Normal in size without focal abnormality.     Adrenals/Urinary Tract: Adrenal glands appear normal. 7 mm  nonobstructive calculus is noted in lower pole collecting system of  right kidney. Stable multiple left renal cysts are noted. No  hydronephrosis or renal obstruction is noted. Urinary bladder is  decompressed.     Stomach/Bowel: Stomach is within normal limits. Appendix appears  normal. No evidence of bowel wall thickening, distention, or  inflammatory changes.     Vascular/Lymphatic: No significant vascular findings are present. No  enlarged abdominal or pelvic lymph nodes.     Reproductive: Uterus and bilateral adnexa are unremarkable.     Other: No abdominal wall hernia or abnormality. No abdominopelvic  ascites.     Musculoskeletal: No acute or significant osseous findings.     IMPRESSION:  Nonobstructive right renal calculus. No hydronephrosis or renal  obstruction is noted.     Stable left renal cyst.     No other abnormality seen in the abdomen or pelvis.        Electronically Signed  By: Marijo Conception M.D.  On: 06/30/2019 08:51      PROCEDURES:          Urinalysis Dipstick Dipstick Cont'd  Color: Yellow Bilirubin: Neg mg/dL  Appearance: Clear Ketones: Trace mg/dL  Specific Gravity: 1.025 Blood: Neg ery/uL  pH: 6.0 Protein: Neg mg/dL  Glucose: Neg mg/dL  Urobilinogen: 0.2 mg/dL    Nitrites: Neg    Leukocyte Esterase: Neg leu/uL    ASSESSMENT:      ICD-10 Details  1 GU:   Renal calculus - N20.0 Undiagnosed New Problem   PLAN:           Orders X-Rays: Renal Ultrasound (Limited) - Right RUS    KUB          Schedule Return Visit/Planned Activity: ASAP - Schedule Surgery          Document Letter(s):  Created for Patient: Clinical Summary         Notes:   -KUB and RUS pending.  -The risks, benefits and alternatives of RIGHT ESWL was discussed with the patient. I described the risks which include arrhythmia, kidney contusion, kidney hemorrhage, need for transfusion, back discomfort, flank ecchymosis, flank abrasion, inability to break up stone, inability to pass stone fragments, Steinstrasse, infection associated with obstructing stones, need for  different surgical procedure and possible need for repeat shockwave lithotripsy. The patient voices understanding and wishes to proceed.  -We discussed criteria to return to clinic or proceed to the ER which include: Fever/chills, worsening pain, nausea/vomiting and/or persistent gross hematuria.         Next Appointment:      Next Appointment: 12/11/2019 11:00 AM    Appointment Type: Renal Ultrasound ONLY    Location: [facility] [phone]    Provider: Radiology Rm 2 Radiology Rm 2    Reason for Visit: Dory Peru, KUB - winter no ov      * Signed by Ellison Hughs, M.D. on 12/10/19 at 5:16 PM (EST)*

## 2019-12-26 ENCOUNTER — Ambulatory Visit: Payer: BC Managed Care – PPO | Attending: Internal Medicine

## 2019-12-26 DIAGNOSIS — Z23 Encounter for immunization: Secondary | ICD-10-CM

## 2019-12-26 NOTE — Progress Notes (Signed)
   Covid-19 Vaccination Clinic  Name:  Twila Job    MRN: [id number] DOB: January 30, 1956  12/26/2019  Ms. Chojnacki was observed post Covid-19 immunization for 15 minutes without incident. She was provided with Vaccine Information Sheet and instruction to access the V-Safe system.   Ms. Kanode was instructed to call 911 with any severe reactions post vaccine: Marland Kitchen Difficulty breathing  . Swelling of face and throat  . A fast heartbeat  . A bad rash all over body  . Dizziness and weakness   Immunizations Administered    Name Date Dose VIS Date Route   Pfizer COVID-19 Vaccine 12/26/2019  9:01 AM 0.3 mL 09/18/2019 Intramuscular   Manufacturer: [place]   Lot: [id number]   [place]: [id number]

## 2019-12-28 ENCOUNTER — Encounter (HOSPITAL_BASED_OUTPATIENT_CLINIC_OR_DEPARTMENT_OTHER)
Admission: RE | Disposition: A | Discharge status: Home / Self Care | Payer: Self-pay | Source: Home / Self Care | Attending: Urology

## 2019-12-28 ENCOUNTER — Encounter (HOSPITAL_BASED_OUTPATIENT_CLINIC_OR_DEPARTMENT_OTHER): Payer: Self-pay | Admitting: Urology

## 2019-12-28 ENCOUNTER — Ambulatory Visit (HOSPITAL_COMMUNITY): Payer: BC Managed Care – PPO

## 2019-12-28 ENCOUNTER — Ambulatory Visit (HOSPITAL_BASED_OUTPATIENT_CLINIC_OR_DEPARTMENT_OTHER)
Admission: RE | Admit: 2019-12-28 | Discharge: 2019-12-28 | Disposition: A | Discharge status: Home / Self Care | Payer: BC Managed Care – PPO | Attending: Urology | Admitting: Urology

## 2019-12-28 ENCOUNTER — Ambulatory Visit (HOSPITAL_BASED_OUTPATIENT_CLINIC_OR_DEPARTMENT_OTHER): Payer: BC Managed Care – PPO | Admitting: Anesthesiology

## 2019-12-28 DIAGNOSIS — N200 Calculus of kidney: Secondary | ICD-10-CM | POA: Diagnosis not present

## 2019-12-28 DIAGNOSIS — N281 Cyst of kidney, acquired: Secondary | ICD-10-CM | POA: Diagnosis not present

## 2019-12-28 DIAGNOSIS — Z950 Presence of cardiac pacemaker: Secondary | ICD-10-CM | POA: Diagnosis not present

## 2019-12-28 DIAGNOSIS — E785 Hyperlipidemia, unspecified: Secondary | ICD-10-CM | POA: Diagnosis not present

## 2019-12-28 DIAGNOSIS — I10 Essential (primary) hypertension: Secondary | ICD-10-CM | POA: Diagnosis not present

## 2019-12-28 DIAGNOSIS — I4891 Unspecified atrial fibrillation: Secondary | ICD-10-CM | POA: Diagnosis not present

## 2019-12-28 HISTORY — PX: EXTRACORPOREAL SHOCK WAVE LITHOTRIPSY: SHX1557

## 2019-12-28 SURGERY — LITHOTRIPSY, ESWL
Anesthesia: Monitor Anesthesia Care | Laterality: Right

## 2019-12-28 MED ORDER — SODIUM CHLORIDE 0.9 % IV SOLN
INTRAVENOUS | Status: DC
  Filled 2019-12-28: qty 1000

## 2019-12-28 MED ORDER — PROPOFOL 500 MG/50ML IV EMUL
INTRAVENOUS | Status: AC
  Filled 2019-12-28: qty 50

## 2019-12-28 MED ORDER — DIAZEPAM 5 MG PO TABS
10.0000 mg | ORAL_TABLET | ORAL | Status: AC
  Administered 2019-12-28: 10 mg via ORAL
  Filled 2019-12-28: qty 2

## 2019-12-28 MED ORDER — DIPHENHYDRAMINE HCL 25 MG PO CAPS
ORAL_CAPSULE | ORAL | Status: AC
  Filled 2019-12-28: qty 1

## 2019-12-28 MED ORDER — DIPHENHYDRAMINE HCL 25 MG PO CAPS
25.0000 mg | ORAL_CAPSULE | ORAL | Status: AC
  Administered 2019-12-28: 25 mg via ORAL
  Filled 2019-12-28: qty 1

## 2019-12-28 MED ORDER — FENTANYL CITRATE (PF) 100 MCG/2ML IJ SOLN
INTRAMUSCULAR | Status: AC
  Filled 2019-12-28: qty 2

## 2019-12-28 MED ORDER — MIDAZOLAM HCL 2 MG/2ML IJ SOLN
INTRAMUSCULAR | Status: AC
  Filled 2019-12-28: qty 2

## 2019-12-28 MED ORDER — HYDROCODONE-ACETAMINOPHEN 5-325 MG PO TABS: 1.0000 | ORAL_TABLET | Freq: Four times a day (QID) | ORAL | 0 refills | Status: DC | PRN

## 2019-12-28 MED ORDER — FENTANYL CITRATE (PF) 100 MCG/2ML IJ SOLN
INTRAMUSCULAR | Status: DC | PRN
  Administered 2019-12-28: 50 ug via INTRAVENOUS
  Administered 2019-12-28 (×2): 25 ug via INTRAVENOUS

## 2019-12-28 MED ORDER — ONDANSETRON HCL 4 MG/2ML IJ SOLN
INTRAMUSCULAR | Status: DC | PRN
  Administered 2019-12-28: 4 mg via INTRAVENOUS

## 2019-12-28 MED ORDER — PROPOFOL 500 MG/50ML IV EMUL
INTRAVENOUS | Status: DC | PRN
  Administered 2019-12-28: 75 ug/kg/min via INTRAVENOUS
  Administered 2019-12-28: 100 ug/kg/min via INTRAVENOUS

## 2019-12-28 MED ORDER — CIPROFLOXACIN HCL 500 MG PO TABS
500.0000 mg | ORAL_TABLET | ORAL | Status: AC
  Administered 2019-12-28: 500 mg via ORAL
  Filled 2019-12-28: qty 1

## 2019-12-28 MED ORDER — DIAZEPAM 5 MG PO TABS
ORAL_TABLET | ORAL | Status: AC
  Filled 2019-12-28: qty 2

## 2019-12-28 MED ORDER — MIDAZOLAM HCL 5 MG/5ML IJ SOLN
INTRAMUSCULAR | Status: DC | PRN
  Administered 2019-12-28: 2 mg via INTRAVENOUS

## 2019-12-28 MED ORDER — ONDANSETRON HCL 4 MG/2ML IJ SOLN
INTRAMUSCULAR | Status: AC
  Filled 2019-12-28: qty 2

## 2019-12-28 MED ORDER — CIPROFLOXACIN HCL 500 MG PO TABS
ORAL_TABLET | ORAL | Status: AC
  Filled 2019-12-28: qty 1

## 2019-12-28 NOTE — Transfer of Care (Signed)
Immediate Anesthesia Transfer of Care Note  Patient: Alyssa Dudley  Procedure(s) Performed: RIGHT EXTRACORPOREAL SHOCK WAVE LITHOTRIPSY (ESWL) (Right )  Patient Location: PACU  Anesthesia Type:MAC  Level of Consciousness: awake, alert  and oriented  Airway & Oxygen Therapy: Patient Spontanous Breathing  Post-op Assessment: Report given to RN and Post -op Vital signs reviewed and stable  Post vital signs: Reviewed and stable  Last Vitals:  Vitals Value Taken Time  BP 145/82 12/28/19 1230  Temp    Pulse 62 12/28/19 1230  Resp 16 12/28/19 1230  SpO2 100 % 12/28/19 1230    Last Pain:  Vitals:   12/28/19 1230  TempSrc:   PainSc: 0-No pain         Complications: No apparent anesthesia complications

## 2019-12-28 NOTE — Interval H&P Note (Signed)
History and Physical Interval Note:  12/28/2019 7:46 AM  Alyssa Dudley  has presented today for surgery, with the diagnosis of RIGHT RENAL STONE.  The various methods of treatment have been discussed with the patient and family. After consideration of risks, benefits and other options for treatment, the patient has consented to  Procedure(s): RIGHT EXTRACORPOREAL SHOCK WAVE LITHOTRIPSY (ESWL) (Right) as a surgical intervention.  The patient's history has been reviewed, patient examined, no change in status, stable for surgery.  I have reviewed the patient's chart and labs.  Questions were answered to the patient's satisfaction.     [employer]

## 2019-12-28 NOTE — Discharge Instructions (Signed)
Post Anesthesia Home Care Instructions  Activity: Get plenty of rest for the remainder of the day. A responsible adult should stay with you for 24 hours following the procedure.  For the next 24 hours, DO NOT: -Drive a car -Operate machinery -Drink alcoholic beverages -Take any medication unless instructed by your physician -Make any legal decisions or sign important papers.  Meals: Start with liquid foods such as gelatin or soup. Progress to regular foods as tolerated. Avoid greasy, spicy, heavy foods. If nausea and/or vomiting occur, drink only clear liquids until the nausea and/or vomiting subsides. Call your physician if vomiting continues.  Special Instructions/Symptoms: Your throat may feel dry or sore from the anesthesia or the breathing tube placed in your throat during surgery. If this causes discomfort, gargle with warm salt water. The discomfort should disappear within 24 hours.  If you had a scopolamine patch placed behind your ear for the management of post- operative nausea and/or vomiting:  1. The medication in the patch is effective for 72 hours, after which it should be removed.  Wrap patch in a tissue and discard in the trash. Wash hands thoroughly with soap and water. 2. You may remove the patch earlier than 72 hours if you experience unpleasant side effects which may include dry mouth, dizziness or visual disturbances. 3. Avoid touching the patch. Wash your hands with soap and water after contact with the patch.   Lithotripsy, Care After This sheet gives you information about how to care for yourself after your procedure. Your health care provider may also give you more specific instructions. If you have problems or questions, contact your health care provider. What can I expect after the procedure? After the procedure, it is common to have:  Some blood in your urine. This should only last for a few days.  Soreness in your back, sides, or upper abdomen for a few  days.  Blotches or bruises on your back where the pressure wave entered the skin.  Pain, discomfort, or nausea when pieces (fragments) of the kidney stone move through the tube that carries urine from the kidney to the bladder (ureter). Stone fragments may pass soon after the procedure, but they may continue to pass for up to 4-8 weeks. ? If you have severe pain or nausea, contact your health care provider. This may be caused by a large stone that was not broken up, and this may mean that you need more treatment.  Some pain or discomfort during urination.  Some pain or discomfort in the lower abdomen or (in men) at the base of the penis. Follow these instructions at home: Medicines  Take over-the-counter and prescription medicines only as told by your health care provider.  If you were prescribed an antibiotic medicine, take it as told by your health care provider. Do not stop taking the antibiotic even if you start to feel better.  Do not drive for 24 hours if you were given a medicine to help you relax (sedative).  Do not drive or use heavy machinery while taking prescription pain medicine. Eating and drinking      Drink enough water and fluids to keep your urine clear or pale yellow. This helps any remaining pieces of the stone to pass. It can also help prevent new stones from forming.  Eat plenty of fresh fruits and vegetables.  Follow instructions from your health care provider about eating and drinking restrictions. You may be instructed: ? To reduce how much salt (sodium) you eat   eat or drink. Check ingredients and nutrition facts on packaged foods and beverages. ? To reduce how much meat you eat.  Eat the recommended amount of calcium for your age and gender. Ask your health care provider how much calcium you should have. General instructions  Get plenty of rest.  Most people can resume normal activities 1-2 days after the procedure. Ask your health care provider what  activities are safe for you.  Your health care provider may direct you to lie in a certain position (postural drainage) and tap firmly (percuss) over your kidney area to help stone fragments pass. Follow instructions as told by your health care provider.  If directed, strain all urine through the strainer that was provided by your health care provider. ? Keep all fragments for your health care provider to see. Any stones that are found may be sent to a medical lab for examination. The stone may be as small as a grain of salt.  Keep all follow-up visits as told by your health care provider. This is important. Contact a health care provider if:  You have pain that is severe or does not get better with medicine.  You have nausea that is severe or does not go away.  You have blood in your urine longer than your health care provider told you to expect.  You have more blood in your urine.  You have pain during urination that does not go away.  You urinate more frequently than usual and this does not go away.  You develop a rash or any other possible signs of an allergic reaction. Get help right away if:  You have severe pain in your back, sides, or upper abdomen.  You have severe pain while urinating.  Your urine is very dark red.  You have blood in your stool (feces).  You cannot pass any urine at all.  You feel a strong urge to urinate after emptying your bladder.  You have a fever or chills.  You develop shortness of breath, difficulty breathing, or chest pain.  You have severe nausea that leads to persistent vomiting.  You faint. Summary  After this procedure, it is common to have some pain, discomfort, or nausea when pieces (fragments) of the kidney stone move through the tube that carries urine from the kidney to the bladder (ureter). If this pain or nausea is severe, however, you should contact your health care provider.  Most people can resume normal activities 1-2  days after the procedure. Ask your health care provider what activities are safe for you.  Drink enough water and fluids to keep your urine clear or pale yellow. This helps any remaining pieces of the stone to pass, and it can help prevent new stones from forming.  If directed, strain your urine and keep all fragments for your health care provider to see. Fragments or stones may be as small as a grain of salt.  Get help right away if you have severe pain in your back, sides, or upper abdomen or have severe pain while urinating. This information is not intended to replace advice given to you by your health care provider. Make sure you discuss any questions you have with your health care provider. Document Revised: 01/05/2019 Document Reviewed: 08/15/2016 Elsevier Patient Education  2020 [place] should strain your urine and collect all fragments and bring them to your follow up appointment.  2. You should take your pain medication as needed.  Please call if  your pain is severe to the point that it is not controlled with your pain medication. 3. You should call if you develop fever > 101 or persistent nausea or vomiting.

## 2019-12-28 NOTE — Anesthesia Preprocedure Evaluation (Addendum)
Anesthesia Evaluation  Patient identified by MRN, date of birth, ID band Patient awake    Reviewed: Allergy & Precautions, NPO status , Patient's Chart, lab work & pertinent test results  History of Anesthesia Complications (+) PONV and history of anesthetic complications  Airway Mallampati: II  TM Distance: >3 FB Neck ROM: Full    Dental no notable dental hx.    Pulmonary neg pulmonary ROS,    Pulmonary exam normal        Cardiovascular hypertension, Pt. on home beta blockers and Pt. on medications Normal cardiovascular exam+ dysrhythmias (Second degree Mobitz II AV block) + pacemaker   TTE 2014: EF 60-65%, PASP 17mHg     Neuro/Psych negative neurological ROS  negative psych ROS   GI/Hepatic Neg liver ROS, GERD  Controlled,  Endo/Other  negative endocrine ROS  Renal/GU negative Renal ROS  negative genitourinary   Musculoskeletal  (+) Arthritis ,   Abdominal   Peds  Hematology negative hematology ROS (+)   Anesthesia Other Findings Day of surgery medications reviewed with patient.  Reproductive/Obstetrics negative OB ROS                            Anesthesia Physical Anesthesia Plan  ASA: III  Anesthesia Plan: MAC   Post-op Pain Management:    Induction:   PONV Risk Score and Plan: 3 and Treatment may vary due to age or medical condition and Propofol infusion  Airway Management Planned: Natural Airway and Simple Face Mask  Additional Equipment: None  Intra-op Plan:   Post-operative Plan:   Informed Consent: I have reviewed the patients History and Physical, chart, labs and discussed the procedure including the risks, benefits and alternatives for the proposed anesthesia with the patient or authorized representative who has indicated his/her understanding and acceptance.       Plan Discussed with: CRNA  Anesthesia Plan Comments:        Anesthesia Quick  Evaluation

## 2019-12-28 NOTE — Anesthesia Postprocedure Evaluation (Signed)
Anesthesia Post Note  Patient: Alyssa Dudley  Procedure(s) Performed: RIGHT EXTRACORPOREAL SHOCK WAVE LITHOTRIPSY (ESWL) (Right )     Patient location during evaluation: PACU Anesthesia Type: MAC Level of consciousness: awake and alert and oriented Pain management: pain level controlled Vital Signs Assessment: post-procedure vital signs reviewed and stable Respiratory status: spontaneous breathing, nonlabored ventilation and respiratory function stable Cardiovascular status: blood pressure returned to baseline Postop Assessment: no apparent nausea or vomiting Anesthetic complications: no    Last Vitals:  Vitals:   12/28/19 0713  BP: (!) 155/88  Pulse: 66  Resp: 16  Temp: (!) 36.3 C  SpO2: 100%    Last Pain:  Vitals:   12/28/19 0713  TempSrc: Oral  PainSc: 0-No pain                 Brennan Bailey

## 2019-12-28 NOTE — Op Note (Signed)
See [facility] operative note scanned into chart. Also because of the size, density, location and other factors that cannot be anticipated I feel this will likely be a staged procedure. This fact supersedes any indication in the scanned [facility] operative note to the contrary.  

## 2019-12-31 ENCOUNTER — Emergency Department (HOSPITAL_COMMUNITY)
Admission: EM | Admit: 2019-12-31 | Discharge: 2020-01-01 | Disposition: A | Discharge status: Home / Self Care | Payer: BC Managed Care – PPO | Attending: Emergency Medicine | Admitting: Emergency Medicine

## 2019-12-31 ENCOUNTER — Emergency Department (HOSPITAL_COMMUNITY): Payer: BC Managed Care – PPO

## 2019-12-31 ENCOUNTER — Ambulatory Visit (INDEPENDENT_AMBULATORY_CARE_PROVIDER_SITE_OTHER): Payer: BC Managed Care – PPO | Admitting: Physician Assistant

## 2019-12-31 ENCOUNTER — Other Ambulatory Visit: Payer: Self-pay

## 2019-12-31 ENCOUNTER — Encounter: Payer: Self-pay | Admitting: Physician Assistant

## 2019-12-31 VITALS — BP 170/100 | HR 76 | Temp 97.2°F | Wt 186.0 lb

## 2019-12-31 DIAGNOSIS — I10 Essential (primary) hypertension: Secondary | ICD-10-CM | POA: Diagnosis not present

## 2019-12-31 DIAGNOSIS — Z79899 Other long term (current) drug therapy: Secondary | ICD-10-CM | POA: Diagnosis not present

## 2019-12-31 DIAGNOSIS — Z950 Presence of cardiac pacemaker: Secondary | ICD-10-CM | POA: Insufficient documentation

## 2019-12-31 DIAGNOSIS — Z8679 Personal history of other diseases of the circulatory system: Secondary | ICD-10-CM | POA: Insufficient documentation

## 2019-12-31 DIAGNOSIS — R519 Headache, unspecified: Secondary | ICD-10-CM | POA: Diagnosis not present

## 2019-12-31 DIAGNOSIS — H538 Other visual disturbances: Secondary | ICD-10-CM | POA: Diagnosis not present

## 2019-12-31 DIAGNOSIS — G4489 Other headache syndrome: Secondary | ICD-10-CM

## 2019-12-31 DIAGNOSIS — R29810 Facial weakness: Secondary | ICD-10-CM | POA: Diagnosis not present

## 2019-12-31 LAB — I-STAT CHEM 8, ED
BUN: 13 mg/dL (ref 8–23)
Calcium, Ion: 1.06 mmol/L — ABNORMAL LOW (ref 1.15–1.40)
Chloride: 108 mmol/L (ref 98–111)
Creatinine, Ser: 0.6 mg/dL (ref 0.44–1.00)
Glucose, Bld: 77 mg/dL (ref 70–99)
HCT: 42 % (ref 36.0–46.0)
Hemoglobin: 14.3 g/dL (ref 12.0–15.0)
Potassium: 3.8 mmol/L (ref 3.5–5.1)
Sodium: 140 mmol/L (ref 135–145)
TCO2: 27 mmol/L (ref 22–32)

## 2019-12-31 LAB — DIFFERENTIAL
Abs Immature Granulocytes: 0 K/uL (ref 0.00–0.07)
Basophils Absolute: 0 K/uL (ref 0.0–0.1)
Basophils Relative: 0 %
Eosinophils Absolute: 0.1 K/uL (ref 0.0–0.5)
Eosinophils Relative: 3 %
Immature Granulocytes: 0 %
Lymphocytes Relative: 50 %
Lymphs Abs: 2.7 K/uL (ref 0.7–4.0)
Monocytes Absolute: 0.6 K/uL (ref 0.1–1.0)
Monocytes Relative: 11 %
Neutro Abs: 2 K/uL (ref 1.7–7.7)
Neutrophils Relative %: 36 %

## 2019-12-31 LAB — CBC
HCT: 44.6 % (ref 36.0–46.0)
Hemoglobin: 14 g/dL (ref 12.0–15.0)
MCH: 30.3 pg (ref 26.0–34.0)
MCHC: 31.4 g/dL (ref 30.0–36.0)
MCV: 96.5 fL (ref 80.0–100.0)
Platelets: 185 K/uL (ref 150–400)
RBC: 4.62 MIL/uL (ref 3.87–5.11)
RDW: 13.2 % (ref 11.5–15.5)
WBC: 5.5 K/uL (ref 4.0–10.5)
nRBC: 0 % (ref 0.0–0.2)

## 2019-12-31 LAB — PROTIME-INR
INR: 0.9 (ref 0.8–1.2)
Prothrombin Time: 12.3 s (ref 11.4–15.2)

## 2019-12-31 LAB — COMPREHENSIVE METABOLIC PANEL WITH GFR
ALT: 20 U/L (ref 0–44)
AST: 22 U/L (ref 15–41)
Albumin: 3.9 g/dL (ref 3.5–5.0)
Alkaline Phosphatase: 102 U/L (ref 38–126)
Anion gap: 15 (ref 5–15)
BUN: 10 mg/dL (ref 8–23)
CO2: 18 mmol/L — ABNORMAL LOW (ref 22–32)
Calcium: 9.3 mg/dL (ref 8.9–10.3)
Chloride: 108 mmol/L (ref 98–111)
Creatinine, Ser: 0.76 mg/dL (ref 0.44–1.00)
GFR calc Af Amer: >60 mL/min
GFR calc non Af Amer: >60 mL/min
Glucose, Bld: 77 mg/dL (ref 70–99)
Potassium: 4.1 mmol/L (ref 3.5–5.1)
Sodium: 141 mmol/L (ref 135–145)
Total Bilirubin: 0.4 mg/dL (ref 0.3–1.2)
Total Protein: 7.8 g/dL (ref 6.5–8.1)

## 2019-12-31 LAB — APTT: aPTT: 28 seconds (ref 24–36)

## 2019-12-31 MED ORDER — SODIUM CHLORIDE 0.9% FLUSH: 3.0000 mL | Freq: Once | INTRAVENOUS | Status: DC

## 2019-12-31 MED ORDER — METOCLOPRAMIDE HCL 5 MG/ML IJ SOLN
10.0000 mg | Freq: Once | INTRAMUSCULAR | Status: AC
  Administered 2020-01-01: 10 mg via INTRAVENOUS
  Filled 2019-12-31: qty 2

## 2019-12-31 MED ORDER — DIPHENHYDRAMINE HCL 50 MG/ML IJ SOLN
25.0000 mg | Freq: Once | INTRAMUSCULAR | Status: AC
  Administered 2020-01-01: 25 mg via INTRAVENOUS
  Filled 2019-12-31: qty 1

## 2019-12-31 NOTE — ED Notes (Signed)
Family at bedside. 

## 2019-12-31 NOTE — ED Triage Notes (Addendum)
Pt bib ems from MD office with reports of headache, hypertension, blurred vision, abnormal gait. LKW 10pm. No hx migraine. No unilateral weakness, no facial droop noted.  Hx afib, not on blood thinners. 20g RAC.  212/110 manual, other VSS.

## 2019-12-31 NOTE — Progress Notes (Signed)
   Subjective:    Patient ID: Ernesto Rutherford, female    DOB: 1955/12/05, 64 y.o.   MRN: [id number]  HPI   Blood pressure (!) 170/100, pulse 76, temperature (!) 97.2 F (36.2 C), weight 186 lb (84.4 kg), SpO2 97 %.   64 y.o. AAF with history of GERD; Constipation; History of peptic ulcer disease;  Mobitz type 2 second degree and high-grade heart block; Hyperlipidemia;  Atrial fibrillation ([place]); HTN (hypertension); Abnormal glucose;.presents with HA, dizziness, floaters/blurry vision, extremely tired, imbalance, nausea and feeling light headed.   She is s/p lithotripsy Monday, had diarrhea and vomiting x 2 days.  She had 2nd COVID vaccine Saturday, 5 days ago.   Medications   Current Outpatient Medications (Cardiovascular):  .  atenolol (TENORMIN) 25 MG tablet, Take 1 tablet Daily for BP     Current Outpatient Medications (Other):  .  triamcinolone cream (KENALOG) 0.1 %, Apply 1 application topically 3 (three) times daily as needed (rash). .  Vitamin D, Ergocalciferol, (DRISDOL) 1.25 MG (50000 UT) CAPS capsule, TAKE 1 CAPSULE BY MOUTH 2 TIMES A WEEK  Problem list She has GERD; Constipation; History of peptic ulcer disease; Family history of malignant neoplasm of gastrointestinal tract; Benign neoplasm of colon; Mobitz type 2 second degree and high-grade heart block; Hyperlipidemia; Vitamin D deficiency; Atrial fibrillation ([place]); HTN (hypertension); Abnormal glucose; Medication management; Overweight (BMI 25.0-29.9); Meralgia paraesthetica, left; Plantar fasciitis of left foot; and Lateral epicondylitis of left elbow on their problem list.  Immunization History  Administered Date(s) Administered  . Influenza Inj Mdck Quad With Preservative 07/26/2017, 08/11/2018, 07/13/2019  . Influenza Split 06/30/2013, 07/20/2014, 07/21/2015  . Influenza,inj,quad, With Preservative 07/05/2016  . PFIZER SARS-COV-2 Vaccination 12/05/2019, 12/26/2019  . PPD Test 02/22/2014, 05/10/2015  . Tdap  10/09/2011  . Zoster Recombinat (Shingrix) 01/09/2017, 02/06/2018     Review of Systems See HPI    Objective:   Physical Exam HENT:     Head: Normocephalic.  Eyes:     General: No visual field deficit. Cardiovascular:     Rate and Rhythm: Normal rate and regular rhythm.     Heart sounds: Normal heart sounds. No murmur.  Pulmonary:     Effort: Pulmonary effort is normal.     Breath sounds: Normal breath sounds.  Abdominal:     General: Bowel sounds are normal.     Palpations: Abdomen is soft.  Musculoskeletal:        General: No swelling. Normal range of motion.     Cervical back: Normal range of motion and neck supple.  Skin:    General: Skin is warm and dry.  Neurological:     Mental Status: She is alert and oriented to person, place, and time.     Cranial Nerves: No cranial nerve deficit, dysarthria or facial asymmetry.     Motor: No weakness.  Psychiatric:        Mood and Affect: Mood normal.     Comments: Depressed speech, slow         Assessment & Plan:  Will send to ER for accelerated hypertension with HA, floaters, nausea, imbalance rule out SAH.

## 2020-01-01 ENCOUNTER — Other Ambulatory Visit: Payer: Self-pay | Admitting: Physician Assistant

## 2020-01-01 MED ORDER — AMLODIPINE BESYLATE 5 MG PO TABS: 5.0000 mg | ORAL_TABLET | Freq: Every day | ORAL | 11 refills | Status: DC

## 2020-01-01 NOTE — ED Provider Notes (Signed)
[facility] EMERGENCY DEPARTMENT Provider Note   CSN: [id number] Arrival date & time: 12/31/19  1652     History Chief Complaint  Patient presents with  . Stroke Symptoms    Alyssa Dudley is a 64 y.o. female.  The history is provided by the spouse and the patient.  Hypertension This is a recurrent problem. The problem occurs daily. The problem has been gradually improving. Associated symptoms include headaches. Pertinent negatives include no chest pain, no abdominal pain and no shortness of breath. Nothing aggravates the symptoms. Nothing relieves the symptoms.  Patient with history of pacemaker placed, paroxysmal atrial fibrillation not on anticoagulation per cardiology presents with headache, lightheadedness.  Patient reports the evening of March 24 she had mild head pressure and took her blood pressure medications.  The following morning she noted gradual worsening of headache, and feeling lightheaded.  She also reports she will have brief floaters in her vision, but no visual loss and specifically denies diplopia (EMS reported brief diplopia) Patient reports her blood pressure is higher than normal No focal weakness.  No slurred speech.  No chest pain or shortness of breath.  She reports feeling mildly unsteady but is able to walk independently. No history of stroke.  She did have lithotripsy several days ago without any complication.  She takes atenolol daily Seen by PCP today who called 911 for her to go the ER Husband reports she has been at her baseline, is not confused, no slurred speech or difficulty walking     Past Medical History:  Diagnosis Date  . Allergy    once a year   . Hyperlipidemia    borderline- no meds   . Pacemaker   . Paroxysmal atrial fibrillation ([place]) 6/15   detected on ppm interrogation  . PONV (postoperative nausea and vomiting)    has vomitted in past  . Second degree Mobitz II AV block   . Vitamin D deficiency     Patient  Active Problem List   Diagnosis Date Noted  . Meralgia paraesthetica, left 02/13/2018  . Plantar fasciitis of left foot 02/13/2018  . Lateral epicondylitis of left elbow 02/13/2018  . Overweight (BMI 25.0-29.9) 02/25/2015  . HTN (hypertension) 08/17/2014  . Abnormal glucose 08/17/2014  . Medication management 08/17/2014  . Atrial fibrillation ([place]) 06/07/2014  . Hyperlipidemia   . Vitamin D deficiency   . Mobitz type 2 second degree and high-grade heart block 01/07/2013  . Family history of malignant neoplasm of gastrointestinal tract 10/15/2011  . Benign neoplasm of colon 10/15/2011  . GERD 01/10/2008  . Constipation 01/10/2008  . History of peptic ulcer disease 01/10/2008    Past Surgical History:  Procedure Laterality Date  . BREAST CYST EXCISION  1980  . COLONOSCOPY    . EXTRACORPOREAL SHOCK WAVE LITHOTRIPSY Right 12/28/2019   Procedure: RIGHT EXTRACORPOREAL SHOCK WAVE LITHOTRIPSY (ESWL);  Surgeon: Raynelle Bring, MD;  Location: [facility];  Service: Urology;  Laterality: Right;  . PERMANENT PACEMAKER INSERTION N/A 01/13/2013   SJM [employer] DR RF implanted by Dr [name] for mobitz II AV block  . PLANTAR FASCIECTOMY    . POLYPECTOMY    . WRIST SURGERY Right 2011   removed cyst     OB History   No obstetric history on file.     Family History  Problem Relation Age of Onset  . Colon cancer Sister 34  . Anemia Sister   . Heart disease Father   . Hyperlipidemia Father   .  Prostate cancer Brother 70  . Multiple myeloma Son 10  . Hypertension Son   . Kidney disease Son   . Multiple myeloma Maternal Aunt   . Cervical cancer Mother   . Hypertension Mother   . Diabetes Mother   . HIV/AIDS Brother   . Colon polyps Neg Hx   . Rectal cancer Neg Hx   . Stomach cancer Neg Hx     Social History   Tobacco Use  . Smoking status: Never Smoker  . Smokeless tobacco: Never Used  Substance Use Topics  . Alcohol use: No  . Drug use: No    Home  Medications Prior to Admission medications   Medication Sig Start Date End Date Taking? Authorizing Provider  atenolol (TENORMIN) 25 MG tablet Take 1 tablet Daily for BP 12/07/19   Unk Pinto, MD  triamcinolone cream (KENALOG) 0.1 % Apply 1 application topically 3 (three) times daily as needed (rash). 03/23/19   Vicie Mutters, PA-C  Vitamin D, Ergocalciferol, (DRISDOL) 1.25 MG (50000 UT) CAPS capsule TAKE 1 CAPSULE BY MOUTH 2 TIMES A WEEK 09/22/19   Liane Comber, NP    Allergies    Patient has no known allergies.  Review of Systems   Review of Systems  Constitutional: Positive for fatigue. Negative for fever.  Eyes: Positive for visual disturbance. Negative for pain.       Reports floaters in vision, denies diplopia  Respiratory: Negative for shortness of breath.   Cardiovascular: Negative for chest pain.  Gastrointestinal: Negative for abdominal pain and vomiting.  Neurological: Positive for light-headedness and headaches. Negative for syncope, facial asymmetry, speech difficulty, weakness and numbness.  All other systems reviewed and are negative.   Physical Exam Updated Vital Signs BP (!) 172/101   Pulse 67   Temp 98.4 F (36.9 C) (Oral)   Resp 15   Ht 1.651 m ('5\' 5"' )   Wt 84.4 kg   SpO2 100%   BMI 30.95 kg/m   Physical Exam CONSTITUTIONAL: Well developed/well nourished HEAD: Normocephalic/atraumatic EYES: EOMI/PERRL, no nystagmus, no visual field deficit  no ptosis ENMT: Mucous membranes moist NECK: supple no meningeal signs, no bruits CV: S1/S2 noted, no murmurs/rubs/gallops noted LUNGS: Lungs are clear to auscultation bilaterally, no apparent distress ABDOMEN: soft, nontender, no rebound or guarding GU:no cva tenderness NEURO:Awake/alert, face symmetric, no arm or leg drift is noted Equal 5/5 strength with shoulder abduction, elbow flex/extension, wrist flex/extension in upper extremities and equal hand grips bilaterally Equal 5/5 strength with hip  flexion,knee flex/extension, foot dorsi/plantar flexion Cranial nerves 3/4/5/6/04/15/09/11/12 tested and intact Gait normal without ataxia No past pointing Sensation to light touch intact in all extremities EXTREMITIES: pulses normal, full ROM SKIN: warm, color normal PSYCH: no abnormalities of mood noted  ED Results / Procedures / Treatments   Labs (all labs ordered are listed, but only abnormal results are displayed) Labs Reviewed  COMPREHENSIVE METABOLIC PANEL - Abnormal; Notable for the following components:      Result Value   CO2 18 (*)    All other components within normal limits  I-STAT CHEM 8, ED - Abnormal; Notable for the following components:   Calcium, Ion 1.06 (*)    All other components within normal limits  PROTIME-INR  APTT  CBC  DIFFERENTIAL    EKG EKG Interpretation  Date/Time:  Thursday December 31 2019 17:01:39 EDT Ventricular Rate:  68 PR Interval:  188 QRS Duration: 186 QT Interval:  474 QTC Calculation: 504 R Axis:   -68 Text Interpretation:  Atrial-sensed ventricular-paced rhythm Abnormal ECG No significant change since last tracing Confirmed by Ripley Fraise [phone]) on 12/31/2019 11:25:53 PM   Radiology CT HEAD WO CONTRAST  Result Date: 12/31/2019 CLINICAL DATA:  Headache and blurred vision. EXAM: CT HEAD WITHOUT CONTRAST TECHNIQUE: Contiguous axial images were obtained from the base of the skull through the vertex without intravenous contrast. COMPARISON:  August 12, 2015 FINDINGS: Brain: No evidence of acute infarction, hemorrhage, hydrocephalus, extra-axial collection or mass lesion/mass effect. A stable thin curvilinear area of calcification is seen along the posterior aspect of the interhemispheric fissure. Vascular: No hyperdense vessel or unexpected calcification. Skull: Normal. Negative for fracture or focal lesion. Sinuses/Orbits: No acute finding. Other: None. IMPRESSION: No acute intracranial pathology. Electronically Signed   By: Virgina Norfolk M.D.   On: 12/31/2019 20:24    Procedures Procedures  Medications Ordered in ED Medications  sodium chloride flush (NS) 0.9 % injection 3 mL (has no administration in time range)  metoCLOPramide (REGLAN) injection 10 mg (has no administration in time range)  diphenhydrAMINE (BENADRYL) injection 25 mg (has no administration in time range)    ED Course  I have reviewed the triage vital signs and the nursing notes.  Pertinent labs & imaging results that were available during my care of the patient were reviewed by me and considered in my medical decision making (see chart for details).    MDM Rules/Calculators/A&P                      12:45 AM Patient is very well-appearing.  She is smiling in no acute distress. She reports her headache is mild at this time.  It was gradual headache, and never severe She never had any focal weakness.  She reported fatigue, mild lightheadedness, and headache.  She also reported floaters in her vision but no visual loss, no diplopia.  Strong suspicion of that the elevated blood pressure triggered some of her symptoms.  I have very low suspicion for acute stroke, low suspicion for [facility], constipation for any other acute neurologic emergency Patient is requesting medications for headache.  She agrees with plan, would not pursue further testing.  She will monitor her blood pressure at home, and follow-up closely with her PCP as she will likely require further antihypertensives 1:28 AM Patient resting comfortably, feels improved. She feels comfortable discharge home discussed strict return precautions Final Clinical Impression(s) / ED Diagnoses Final diagnoses:  Other headache syndrome  Essential hypertension    Rx / DC Orders ED Discharge Orders    None       Ripley Fraise, MD 01/01/20 0128

## 2020-01-01 NOTE — ED Notes (Signed)
Patient verbalizes understanding of discharge instructions. Opportunity for questioning and answers were provided. Armband removed by staff, pt discharged from ED. Ambulated to car with spouse.

## 2020-01-01 NOTE — Discharge Instructions (Addendum)
Be sure to check your blood pressure every day.  Call your primary doctor on Monday for follow-up as you may need new blood pressure medicine  You are having a headache. No specific cause was found today for your headache. It may have been a migraine or other cause of headache. Stress, anxiety, fatigue, and depression are common triggers for headaches. Your headache today does not appear to be life-threatening or require hospitalization, but often the exact cause of headaches is not determined in the emergency department. Therefore, follow-up with your doctor is very important to find out what may have caused your headache, and whether or not you need any further diagnostic testing or treatment. Sometimes headaches can appear benign (not harmful), but then more serious symptoms can develop which should prompt an immediate re-evaluation by your doctor or the emergency department.  SEEK MEDICAL ATTENTION IF:  You develop possible problems with medications prescribed.  The medications don't resolve your headache, if it recurs , or if you have multiple episodes of vomiting or can't take fluids. You have a change from the usual headache.  RETURN IMMEDIATELY IF you develop a sudden, severe headache or confusion, become poorly responsive or faint, develop a fever above 100.76F or problem breathing, have a change in speech, vision, swallowing, or understanding, or develop new weakness, numbness, tingling, incoordination, or have a seizure.

## 2020-01-01 NOTE — Progress Notes (Signed)
Patient had negative CT head at ER, will increase atenolol to 50 mg and send in Norvasc 5 mg, patient will take 1/2 if BP is over 150

## 2020-01-04 NOTE — Progress Notes (Signed)
FOLLOW UP  Assessment and Plan: Essential hypertension - continue medications, DASH diet, exercise and monitor at home. Call if greater than 130/80. Goal will be 120 Will increase atenolol to 100mg  and monitor - CBC with Differential/Platelet - BASIC METABOLIC PANEL WITH GFR - TSH - Hepatic function panel   Hyperlipidemia -check lipids, decrease fatty foods, increase activity.  ADD on fiber, no meds at this time, wants to try diet- with recent symptoms STRONGLY suggest aggressive medical therapy with statin, she will think about it.  - Lipid panel - given info about benefits of statins  Obesity Obesity with co morbidities- long discussion about weight loss, diet, and exercise  Mobitz type 2 second degree and high-grade heart block Has pacemaker   Paroxysmal atrial fibrillation Not on coagulation, following with Cardio   Discussed med's effects and SE's. Screening labs and tests as requested with regular follow-up as recommended. Future Appointments  Date Time Provider [place]  03/21/2020 10:35 AM [facility] DEVICE REMOTES [facility]  03/28/2020 10:00 AM Vicie Mutters, PA-C [facility] None  06/20/2020 10:35 AM [facility] DEVICE REMOTES [facility]  09/19/2020 10:35 AM [facility] DEVICE REMOTES [facility]  12/19/2020 10:35 AM [facility] DEVICE REMOTES [facility]     HPI  64 y.o. female  presents for 6 month follow up for HTN, chol, preDM, vitamin D def.   Patient had recent HA with vision changes, imbalance and dizziness with accelerated HTN. She was sent to the ER, had negative CT head and started on HTN medication. Has been checking BP has been 130's to 120's.   Her blood pressure has been controlled at home, today their BP is BP: 136/80 She does workout, walking and running, doing 5K this Saturday. She denies chest pain, shortness of breath, dizziness.  She has Afib and mobitz type 2, follows with Dr.  [name] and Leonides Schanz, low risk CHADSVASC 1 and not on anticoagulation.  She is not on cholesterol medication, was suppose to be on crestor but she is not one it.  Her cholesterol is not at goal. The cholesterol last visit was:   Lab Results  Component Value Date   CHOL 193 09/22/2019   HDL 53 09/22/2019   LDLCALC 124 (H) 09/22/2019   TRIG 68 09/22/2019   CHOLHDL 3.6 09/22/2019    She has been working on diet and exercise for prediabetes and has done a good job of decreasing her A1C, and denies paresthesia of the feet, polydipsia, polyuria and visual disturbances. Last A1C in the office was:  Lab Results  Component Value Date   HGBA1C 5.3 03/23/2019  Patient is on Vitamin D supplement, 50,000 IU only once a week.   Lab Results  Component Value Date   VD25OH 73 03/23/2019    BMI is Body mass index is 30.45 kg/m., she is working on diet and exercise. Wt Readings from Last 3 Encounters:  01/05/20 183 lb (83 kg)  12/31/19 186 lb (84.4 kg)  12/31/19 186 lb (84.4 kg)     Current Medications:  Current Outpatient Medications on File Prior to Visit  Medication Sig Dispense Refill  . amLODipine (NORVASC) 5 MG tablet Take 1 tablet (5 mg total) by mouth daily. 30 tablet 11  . atenolol (TENORMIN) 25 MG tablet Take 1 tablet Daily for BP 90 tablet 1  . triamcinolone cream (KENALOG) 0.1 % Apply 1 application topically 3 (three) times daily as needed (rash). 30 g 3  . Vitamin D, Ergocalciferol, (DRISDOL) 1.25 MG (50000 UT) CAPS capsule TAKE  1 CAPSULE BY MOUTH 2 TIMES A WEEK 30 capsule 1   No current facility-administered medications on file prior to visit.   Patient Active Problem List   Diagnosis Date Noted  . Meralgia paraesthetica, left 02/13/2018  . Plantar fasciitis of left foot 02/13/2018  . Lateral epicondylitis of left elbow 02/13/2018  . Overweight (BMI 25.0-29.9) 02/25/2015  . HTN (hypertension) 08/17/2014  . Abnormal glucose 08/17/2014  . Medication management 08/17/2014  .  Atrial fibrillation ([place]) 06/07/2014  . Hyperlipidemia   . Vitamin D deficiency   . Mobitz type 2 second degree and high-grade heart block 01/07/2013  . Family history of malignant neoplasm of gastrointestinal tract 10/15/2011  . Benign neoplasm of colon 10/15/2011  . GERD 01/10/2008  . Constipation 01/10/2008  . History of peptic ulcer disease 01/10/2008     Allergies: No Known Allergies   Review of Systems: Review of Systems  Constitutional: Negative.   HENT: Negative.   Eyes: Negative.   Respiratory: Negative.   Cardiovascular: Negative.   Gastrointestinal: Negative.   Genitourinary: Negative.   Musculoskeletal: Negative.   Skin: Negative.   Neurological: Negative.   Endo/Heme/Allergies: Negative.   Psychiatric/Behavioral: Negative for depression, hallucinations, memory loss, substance abuse and suicidal ideas. The patient is not nervous/anxious and does not have insomnia.    Physical Exam: Estimated body mass index is 30.45 kg/m as calculated from the following:   Height as of 12/31/19: 5\' 5"  (1.651 m).   Weight as of this encounter: 183 lb (83 kg). BP 136/80   Pulse 77   Temp 98.8 F (37.1 C)   Wt 183 lb (83 kg)   SpO2 100%   BMI 30.45 kg/m  General Appearance: Well nourished, in no apparent distress.  Eyes: PERRLA, EOMs, conjunctiva no swelling or erythema, normal fundi and vessels.  Sinuses: No Frontal/maxillary tenderness  ENT/Mouth: Ext aud canals clear, normal light reflex with TMs without erythema, bulging. Good dentition. No erythema, swelling, or exudate on post pharynx. Tonsils not swollen or erythematous. Hearing normal.  Neck: Supple, thyroid normal. No bruits  Respiratory: Respiratory effort normal, BS equal bilaterally without rales, rhonchi, wheezing or stridor.  Cardio: RRR without murmurs, rubs or gallops. Brisk peripheral pulses without edema.  Chest: symmetric, with normal excursions and percussion. .  Abdomen: Soft, nontender, no guarding,  rebound, hernias, masses, or organomegaly.  Lymphatics: Non tender without lymphadenopathy.  Musculoskeletal: Full ROM all peripheral extremities,5/5 strength, and normal gait, negative straight leg. .  Skin: Warm, dry without rashes, lesions, ecchymosis. Neuro: Cranial nerves intact, reflexes equal bilaterally. Normal muscle tone, no cerebellar symptoms. Sensation intact.  Psych: Awake and oriented X 3, normal affect, Insight and Judgment appropriate.    Vicie Mutters 4:17 PM [facility]

## 2020-01-05 ENCOUNTER — Encounter: Payer: Self-pay | Admitting: Physician Assistant

## 2020-01-05 ENCOUNTER — Other Ambulatory Visit: Payer: Self-pay

## 2020-01-05 ENCOUNTER — Ambulatory Visit (INDEPENDENT_AMBULATORY_CARE_PROVIDER_SITE_OTHER): Payer: BC Managed Care – PPO | Admitting: Physician Assistant

## 2020-01-05 VITALS — BP 136/80 | HR 77 | Temp 98.8°F | Wt 183.0 lb

## 2020-01-05 DIAGNOSIS — E559 Vitamin D deficiency, unspecified: Secondary | ICD-10-CM

## 2020-01-05 DIAGNOSIS — R7309 Other abnormal glucose: Secondary | ICD-10-CM

## 2020-01-05 DIAGNOSIS — E7800 Pure hypercholesterolemia, unspecified: Secondary | ICD-10-CM

## 2020-01-05 DIAGNOSIS — Z79899 Other long term (current) drug therapy: Secondary | ICD-10-CM

## 2020-01-05 DIAGNOSIS — I10 Essential (primary) hypertension: Secondary | ICD-10-CM

## 2020-01-05 MED ORDER — ATENOLOL 50 MG PO TABS: ORAL_TABLET | ORAL | 3 refills | Status: DC

## 2020-01-05 NOTE — Patient Instructions (Addendum)
Your LDL could improve, ideally we want it under a 100.  Your LDL is the bad cholesterol that can lead to heart attack and stroke. To lower your number you can decrease your fatty foods, red meat, cheese, milk and increase fiber like whole grains and veggies. You can also add a fiber supplement like Citracel or Benefiber, these do not cause gas and bloating and are safe to use. Especially if you have a strong family history of heart disease or stroke or you have evidence of plaque on any imaging like a chest xray, we may discuss at your next office visit putting you on a medication to get your number below 100.   Increase atenolol to 50 mg daily, monitor your heart rate and cholesterol. Goal is top number at 120.    Add sugar free lemonade or lemon water for kidney stones  HYPERTENSION INFORMATION  Monitor your blood pressure at home, please keep a record and bring that in with you to your next office visit.   Go to the ER if any CP, SOB, nausea, dizziness, severe HA, changes vision/speech  Testing/Procedures: HOW TO TAKE YOUR BLOOD PRESSURE:  Rest 5 minutes before taking your blood pressure.  Don't smoke or drink caffeinated beverages for at least 30 minutes before.  Take your blood pressure before (not after) you eat.  Sit comfortably with your back supported and both feet on the floor (don't cross your legs).  Elevate your arm to heart level on a table or a desk.  Use the proper sized cuff. It should fit smoothly and snugly around your bare upper arm. There should be enough room to slip a fingertip under the cuff. The bottom edge of the cuff should be 1 inch above the crease of the elbow.  Due to a recent study, SPRINT, we have changed our goal for the systolic or top blood pressure number. Ideally we want your top number at 120.  In the [facility] Trial, 5000 people were randomized to a goal BP of 120 and 5000 people were randomized to a goal BP of less than 140. The patients with the  goal BP at 120 had LESS DEMENTIA, LESS HEART ATTACKS, AND LESS STROKES, AS WELL AS OVERALL DECREASED MORTALITY OR DEATH RATE.   There was another study that showed taking your blood pressure medications at night decrease cardiovascular events.  However if you are on a fluid pill, please take this in the morning.   If you are willing, our goal BP is the top number of 120.  Your most recent BP: BP: 136/80   Take your medications faithfully as instructed. Maintain a healthy weight. Get at least 150 minutes of aerobic exercise per week. Minimize salt intake. Minimize alcohol intake  DASH Eating Plan DASH stands for "Dietary Approaches to Stop Hypertension." The DASH eating plan is a healthy eating plan that has been shown to reduce high blood pressure (hypertension). Additional health benefits may include reducing the risk of type 2 diabetes mellitus, heart disease, and stroke. The DASH eating plan may also help with weight loss. WHAT DO I NEED TO KNOW ABOUT THE DASH EATING PLAN? For the DASH eating plan, you will follow these general guidelines:  Choose foods with a percent daily value for sodium of less than 5% (as listed on the food label).  Use salt-free seasonings or herbs instead of table salt or sea salt.  Check with your health care provider or pharmacist before using salt substitutes.  Eat lower-sodium  products, often labeled as "lower sodium" or "no salt added."  Eat fresh foods.  Eat more vegetables, fruits, and low-fat dairy products.  Choose whole grains. Look for the word "whole" as the first word in the ingredient list.  Choose fish and skinless chicken or [place] more often than red meat. Limit fish, poultry, and meat to 6 oz (170 g) each day.  Limit sweets, desserts, sugars, and sugary drinks.  Choose heart-healthy fats.  Limit cheese to 1 oz (28 g) per day.  Eat more home-cooked food and less restaurant, buffet, and fast food.  Limit fried foods.  Cook foods  using methods other than frying.  Limit canned vegetables. If you do use them, rinse them well to decrease the sodium.  When eating at a restaurant, ask that your food be prepared with less salt, or no salt if possible. WHAT FOODS CAN I EAT? Seek help from a dietitian for individual calorie needs. Grains Whole grain or whole wheat bread. Brown rice. Whole grain or whole wheat pasta. Quinoa, bulgur, and whole grain cereals. Low-sodium cereals. Corn or whole wheat flour tortillas. Whole grain cornbread. Whole grain crackers. Low-sodium crackers. Vegetables Fresh or frozen vegetables (raw, steamed, roasted, or grilled). Low-sodium or reduced-sodium tomato and vegetable juices. Low-sodium or reduced-sodium tomato sauce and paste. Low-sodium or reduced-sodium canned vegetables.  Fruits All fresh, canned (in natural juice), or frozen fruits. Meat and Other Protein Products Ground beef (85% or leaner), grass-fed beef, or beef trimmed of fat. Skinless chicken or [place]. Ground chicken or [place]. Pork trimmed of fat. All fish and seafood. Eggs. Dried beans, peas, or lentils. Unsalted nuts and seeds. Unsalted canned beans. Dairy Low-fat dairy products, such as skim or 1% milk, 2% or reduced-fat cheeses, low-fat ricotta or cottage cheese, or plain low-fat yogurt. Low-sodium or reduced-sodium cheeses. Fats and Oils Tub margarines without trans fats. Light or reduced-fat mayonnaise and salad dressings (reduced sodium). Avocado. Safflower, olive, or canola oils. Natural peanut or almond butter. Other Unsalted popcorn and pretzels. The items listed above may not be a complete list of recommended foods or beverages. Contact your dietitian for more options. WHAT FOODS ARE NOT RECOMMENDED? Grains White bread. White pasta. White rice. Refined cornbread. Bagels and croissants. Crackers that contain trans fat. Vegetables Creamed or fried vegetables. Vegetables in a cheese sauce. Regular canned vegetables.  Regular canned tomato sauce and paste. Regular tomato and vegetable juices. Fruits Dried fruits. Canned fruit in light or heavy syrup. Fruit juice. Meat and Other Protein Products Fatty cuts of meat. Ribs, chicken wings, bacon, sausage, bologna, salami, chitterlings, fatback, hot dogs, bratwurst, and packaged luncheon meats. Salted nuts and seeds. Canned beans with salt. Dairy Whole or 2% milk, cream, half-and-half, and cream cheese. Whole-fat or sweetened yogurt. Full-fat cheeses or blue cheese. Nondairy creamers and whipped toppings. Processed cheese, cheese spreads, or cheese curds. Condiments Onion and garlic salt, seasoned salt, table salt, and sea salt. Canned and packaged gravies. Worcestershire sauce. Tartar sauce. Barbecue sauce. Teriyaki sauce. Soy sauce, including reduced sodium. Steak sauce. Fish sauce. Oyster sauce. Cocktail sauce. Horseradish. Ketchup and mustard. Meat flavorings and tenderizers. Bouillon cubes. Hot sauce. Tabasco sauce. Marinades. Taco seasonings. Relishes. Fats and Oils Butter, stick margarine, lard, shortening, ghee, and bacon fat. Coconut, palm kernel, or palm oils. Regular salad dressings. Other Pickles and olives. Salted popcorn and pretzels. The items listed above may not be a complete list of foods and beverages to avoid. Contact your dietitian for more information. WHERE CAN I FIND MORE  INFORMATION? National Heart, Lung, and [employer] Institute: travelstabloid.com Document Released: 09/13/2011 Document Revised: 02/08/2014 Document Reviewed: 07/29/2013 [facility] Patient Information 2015 [place], [place]. This information is not intended to replace advice given to you by your health care provider. Make sure you discuss any questions you have with your health care provider.

## 2020-01-06 ENCOUNTER — Ambulatory Visit: Payer: BC Managed Care – PPO

## 2020-01-06 LAB — CBC WITH DIFFERENTIAL/PLATELET
Absolute Monocytes: 450 cells/uL (ref 200–950)
Basophils Absolute: 20 cells/uL (ref 0–200)
Basophils Relative: 0.4 %
Eosinophils Absolute: 100 cells/uL (ref 15–500)
Eosinophils Relative: 2 %
HCT: 40.5 % (ref 35.0–45.0)
Hemoglobin: 13.6 g/dL (ref 11.7–15.5)
Lymphs Abs: 2350 cells/uL (ref 850–3900)
MCH: 30.6 pg (ref 27.0–33.0)
MCHC: 33.6 g/dL (ref 32.0–36.0)
MCV: 91.2 fL (ref 80.0–100.0)
MPV: 10 fL (ref 7.5–12.5)
Monocytes Relative: 9 %
Neutro Abs: 2080 cells/uL (ref 1500–7800)
Neutrophils Relative %: 41.6 %
Platelets: 247 10*3/uL (ref 140–400)
RBC: 4.44 10*6/uL (ref 3.80–5.10)
RDW: 13 % (ref 11.0–15.0)
Total Lymphocyte: 47 %
WBC: 5 10*3/uL (ref 3.8–10.8)

## 2020-01-06 LAB — COMPLETE METABOLIC PANEL WITH GFR
AG Ratio: 1.5 (calc) (ref 1.0–2.5)
ALT: 14 U/L (ref 6–29)
AST: 14 U/L (ref 10–35)
Albumin: 4.3 g/dL (ref 3.6–5.1)
Alkaline phosphatase (APISO): 101 U/L (ref 37–153)
BUN: 14 mg/dL (ref 7–25)
CO2: 31 mmol/L (ref 20–32)
Calcium: 9.6 mg/dL (ref 8.6–10.4)
Chloride: 107 mmol/L (ref 98–110)
Creat: 0.76 mg/dL (ref 0.50–0.99)
GFR, Est African American: 97 mL/min/{1.73_m2} (ref >=60)
GFR, Est Non African American: 84 mL/min/{1.73_m2} (ref >=60)
Globulin: 2.9 g/dL (calc) (ref 1.9–3.7)
Glucose, Bld: 93 mg/dL (ref 65–99)
Potassium: 4.4 mmol/L (ref 3.5–5.3)
Sodium: 144 mmol/L (ref 135–146)
Total Bilirubin: 0.4 mg/dL (ref 0.2–1.2)
Total Protein: 7.2 g/dL (ref 6.1–8.1)

## 2020-01-06 LAB — LIPID PANEL
Cholesterol: 196 mg/dL (ref <200)
HDL: 44 mg/dL — ABNORMAL LOW (ref >=50)
LDL Cholesterol (Calc): 129 mg/dL (calc) — ABNORMAL HIGH
Non-HDL Cholesterol (Calc): 152 mg/dL (calc) — ABNORMAL HIGH (ref <130)
Total CHOL/HDL Ratio: 4.5 (calc) (ref <5.0)
Triglycerides: 121 mg/dL (ref <150)

## 2020-01-06 LAB — VITAMIN D 25 HYDROXY (VIT D DEFICIENCY, FRACTURES): Vit D, 25-Hydroxy: 91 ng/mL (ref 30–100)

## 2020-01-06 LAB — TSH: TSH: 3.29 mIU/L (ref 0.40–4.50)

## 2020-01-06 LAB — HEMOGLOBIN A1C
Hgb A1c MFr Bld: 5.4 % of total Hgb (ref <5.7)
Mean Plasma Glucose: 108 (calc)
eAG (mmol/L): 6 (calc)

## 2020-01-06 LAB — MAGNESIUM: Magnesium: 2.1 mg/dL (ref 1.5–2.5)

## 2020-01-25 DIAGNOSIS — N200 Calculus of kidney: Secondary | ICD-10-CM | POA: Diagnosis not present

## 2020-01-26 DIAGNOSIS — N200 Calculus of kidney: Secondary | ICD-10-CM | POA: Diagnosis not present

## 2020-02-02 ENCOUNTER — Encounter: Payer: Self-pay | Admitting: Internal Medicine

## 2020-02-24 DIAGNOSIS — N200 Calculus of kidney: Secondary | ICD-10-CM | POA: Diagnosis not present

## 2020-03-01 DIAGNOSIS — Z20822 Contact with and (suspected) exposure to covid-19: Secondary | ICD-10-CM | POA: Diagnosis not present

## 2020-03-21 ENCOUNTER — Ambulatory Visit (INDEPENDENT_AMBULATORY_CARE_PROVIDER_SITE_OTHER): Payer: BC Managed Care – PPO | Admitting: *Deleted

## 2020-03-21 DIAGNOSIS — I441 Atrioventricular block, second degree: Secondary | ICD-10-CM | POA: Diagnosis not present

## 2020-03-24 LAB — CUP PACEART REMOTE DEVICE CHECK
Battery Remaining Longevity: 110 mo
Battery Remaining Percentage: 81 %
Battery Voltage: 2.93 V
Brady Statistic AP VP Percent: 78 %
Brady Statistic AP VS Percent: 1 %
Brady Statistic AS VP Percent: 22 %
Brady Statistic AS VS Percent: 1 %
Brady Statistic RA Percent Paced: 77 %
Brady Statistic RV Percent Paced: 99 %
Date Time Interrogation Session: 20210615115936
Implantable Lead Implant Date: 20140408
Implantable Lead Implant Date: 20140408
Implantable Lead Location: 753859
Implantable Lead Location: 753860
Implantable Lead Model: 1944
Implantable Lead Model: 1948
Implantable Pulse Generator Implant Date: 20140408
Lead Channel Impedance Value: 530 Ohm
Lead Channel Impedance Value: 660 Ohm
Lead Channel Pacing Threshold Amplitude: 0.375 V
Lead Channel Pacing Threshold Amplitude: 0.625 V
Lead Channel Pacing Threshold Pulse Width: 0.4 ms
Lead Channel Pacing Threshold Pulse Width: 0.4 ms
Lead Channel Sensing Intrinsic Amplitude: 12 mV
Lead Channel Sensing Intrinsic Amplitude: 3.5 mV
Lead Channel Setting Pacing Amplitude: 0.875
Lead Channel Setting Pacing Amplitude: 1.375
Lead Channel Setting Pacing Pulse Width: 0.4 ms
Lead Channel Setting Sensing Sensitivity: 2 mV
Pulse Gen Model: 2210
Pulse Gen Serial Number: 7457437

## 2020-03-24 NOTE — Progress Notes (Signed)
Remote pacemaker transmission.   

## 2020-03-27 NOTE — Progress Notes (Signed)
Complete Physical  Assessment and Plan:  Encounter for routine adult health examination with abnormal findings 1 year  Mobitz type 2 second degree and high-grade heart block Pacemaker in place, continue follow up with Dr. [name]  Essential hypertension Continue medication Monitor blood pressure at home; call if consistently over 130/80 Continue DASH diet.   Reminder to go to the ER if any CP, SOB, nausea, dizziness, severe HA, changes vision/speech, left arm numbness and tingling and jaw pain.  Paroxysmal atrial fibrillation (HCC) CHADSASC is now 2 since she is being treated for HTN- not on anticoag Followed by cardiology- may want to discuss- will likely need with turning 66 at least  Gastroesophageal reflux disease, esophagitis presence not specified Well managed on current medications; PRN OTC agents Discussed diet, avoiding triggers and other lifestyle changes  Benign neoplasm of colon, unspecified part of colon UTD  Vitamin D deficiency Continue supplementation for goal of 70-100 Check vitamin D level  Overweight (BMI 25.0-29.9) Long discussion about weight loss, diet, and exercise Recommended diet heavy in fruits and veggies and low in animal meats, cheeses, and dairy products, appropriate calorie intake Patient will work on cutting down on sugary snacks Follow up at next visit  Medication management CBC, CMP/GFR, UA, magnesium  Pure hypercholesterolemia Currently at goal by lifestyle modification Continue low cholesterol diet and exercise.  Check lipid panel.   History of peptic ulcer disease Avoid NSAIDS  Abnormal glucose Discussed diet/exercise, weight management  A1C  Chronic pain of both shoulders Check labs Had XR in 2013 showing OA- likely OA with possible impingment- will refer to ortho for Xray and evaluation -     ANA -     Anti-DNA antibody, double-stranded -     Sedimentation rate -     Rheumatoid factor -     Ambulatory referral to  Orthopedic Surgery  Acquired hallux rigidus, right -     Uric acid - check labs likely hallux rigidius with OA at 1st MTP right foot - declines referral - wear rigid foot wear, toe seperator, if not better refer to podiatry  Acute left-sided low back pain without sciatica -     dexamethasone (DECADRON) injection 10 mg - Verbal consent obtained, risk and benefits were addressed with patient. A trigger point injection was performed at the site of maximal tenderness using 1% plain Lidocaine and Dexamethasone. This was well tolerated, and followed by immediate relief of pain. Return precautions discussed with the patient.  Has seen ortho in the past, negative straight leg raise, great ROM left hip- likely SI/muscular- will do injection and refer to ortho for PT/further imaging.   Anemia, unspecified type -     Iron,Total/Total Iron Binding Cap -     Ferritin -     Vitamin B12  Pap smear for cervical cancer screening -     Herpes I and II, IgM   Discussed med's effects and SE's. Screening labs and tests as requested with regular follow-up as recommended. Over 60 minutes of exam, counseling, chart review and critical decision making was performed  Future Appointments  Date Time Provider [place]  06/20/2020 10:35 AM [facility] DEV[facility]  09/19/2020 10:35 AM [facility] DEVICE REMOTES [facility]  12/19/2020 10:35 AM [facility] DEVICE REMOTES [facility]  03/30/2021  9:00 AM Vicie Mutters, PA-C [id number] None     HPI  This very nice 64 y.o.female presents for complete physical. has GERD; Constipation; History of peptic ulcer disease; Family history of malignant neoplasm  of gastrointestinal tract; Benign neoplasm of colon; Mobitz type 2 second degree and high-grade heart block; Hyperlipidemia; Vitamin D deficiency; Atrial fibrillation ([place]); HTN (hypertension); Abnormal glucose; Medication management; Overweight (BMI  25.0-29.9); Meralgia paraesthetica, left; Plantar fasciitis of left foot; and Lateral epicondylitis of left elbow on their problem list.   She has Afib and mobitz type 2 with pacemaker placement in 2014, follows with Dr. [name] and Leonides Schanz, low risk CHADSVASC 1 and not on anticoagulation. Patient reports no complaints at this time.   She is having left lower back pulling with walking, prolonged standing or bending over, and having left thigh pain/discomfort. Also feels better if she lifts up while standing . She states bilateral shoulders with hurt and her arms will fall asleep if she sleeps on them. She is right handed, states her left Is worse than right.  She had left and right shoulder Xray 2013 showed DJD.  She does not take tylenol or advil.  She has right bunion and right hallux rigidus- she bought a toe separator.   BMI is Body mass index is 30.32 kg/m., she has been working on diet and exercise.  Wt Readings from Last 3 Encounters:  03/28/20 185 lb (83.9 kg)  01/05/20 183 lb (83 kg)  12/31/19 186 lb (84.4 kg)   Her blood pressure has been controlled at home, today their BP is BP: 132/80  She does workout. She denies chest pain, shortness of breath, dizziness.   She is not on cholesterol medication, she tried zetia but states she had diarrhea with that, taking 3 x a week. . Her cholesterol is not at goal. The cholesterol last visit was:   Lab Results  Component Value Date   CHOL 196 01/05/2020   HDL 44 (L) 01/05/2020   LDLCALC 129 (H) 01/05/2020   TRIG 121 01/05/2020   CHOLHDL 4.5 01/05/2020    She has been working on diet and exercise for glucose management, and denies foot ulcerations, increased appetite, nausea, paresthesia of the feet, polydipsia, polyuria, visual disturbances and vomiting. Last A1C in the office was:  Lab Results  Component Value Date   HGBA1C 5.4 01/05/2020   Patient is on Vitamin D supplement, 1 capsule twice a week Lab Results  Component Value Date    VD25OH 14 01/05/2020       Current Medications:  Current Outpatient Medications on File Prior to Visit  Medication Sig Dispense Refill  . atenolol (TENORMIN) 50 MG tablet Take 1 tablet Daily for BP 90 tablet 3  . triamcinolone cream (KENALOG) 0.1 % Apply 1 application topically 3 (three) times daily as needed (rash). 30 g 3  . Vitamin D, Ergocalciferol, (DRISDOL) 1.25 MG (50000 UT) CAPS capsule TAKE 1 CAPSULE BY MOUTH 2 TIMES A WEEK 30 capsule 1   No current facility-administered medications on file prior to visit.   Health Maintenance:   Immunization History  Administered Date(s) Administered  . Influenza Inj Mdck Quad With Preservative 07/26/2017, 08/11/2018, 07/13/2019  . Influenza Split 06/30/2013, 07/20/2014, 07/21/2015  . Influenza,inj,quad, With Preservative 07/05/2016  . PFIZER SARS-COV-2 Vaccination 12/05/2019, 12/26/2019  . PPD Test 02/22/2014, 05/10/2015  . Tdap 10/09/2011  . Zoster Recombinat (Shingrix) 01/09/2017, 02/06/2018   Colonoscopy:05/2018 Mammo: 08/2019  BMD: 2015 WNL will wait until closer to 66 Pap/ Pelvic: 2018 WNL - HPV neg DUE AB [place] 2011 CXR 2014 Echo 01/2013  EYE: Dr. Peter Garter, last 2018, wears glasses Dentist: Dr. Glynn Octave, Q 45month  Allergies: No Known Allergies Medical History:  has  GERD; Constipation; History of peptic ulcer disease; Family history of malignant neoplasm of gastrointestinal tract; Benign neoplasm of colon; Mobitz type 2 second degree and high-grade heart block; Hyperlipidemia; Vitamin D deficiency; Atrial fibrillation ([place]); HTN (hypertension); Abnormal glucose; Medication management; Overweight (BMI 25.0-29.9); Meralgia paraesthetica, left; Plantar fasciitis of left foot; and Lateral epicondylitis of left elbow on their problem list. Surgical History:  She  has a past surgical history that includes Breast cyst excision (1980); Wrist surgery (Right, 2011); permanent pacemaker insertion (N/A, 01/13/2013); Colonoscopy; Polypectomy;  Plantar fasciectomy; and Extracorporeal shock wave lithotripsy (Right, 12/28/2019). Family History:  Her family history includes Anemia in her sister; Cervical cancer in her mother; Colon cancer (age of onset: 44) in her sister; Diabetes in her mother; HIV/AIDS in her brother; Heart disease in her father; Hyperlipidemia in her father; Hypertension in her mother and son; Kidney disease in her son; Multiple myeloma in her maternal aunt; Multiple myeloma (age of onset: 28) in her son; Prostate cancer (age of onset: 36) in her brother. Social History:   reports that she has never smoked. She has never used smokeless tobacco. She reports that she does not drink alcohol and does not use drugs.  Review of Systems: Review of Systems  Constitutional: Negative for malaise/fatigue and weight loss.  HENT: Negative for hearing loss and tinnitus.   Eyes: Negative for blurred vision and double vision.  Respiratory: Negative for cough, sputum production, shortness of breath and wheezing.   Cardiovascular: Negative for chest pain, palpitations, orthopnea, claudication, leg swelling and PND.  Gastrointestinal: Negative for abdominal pain, blood in stool, constipation, diarrhea, heartburn, melena, nausea and vomiting.  Genitourinary: Negative.   Musculoskeletal: Positive for back pain and myalgias (left leg lateral pain, worse with standing, movement, she can shift her weight that can help). Negative for falls and joint pain.  Skin: Negative for rash.  Neurological: Negative for dizziness, tingling, sensory change, weakness and headaches.  Endo/Heme/Allergies: Negative for polydipsia.  Psychiatric/Behavioral: Negative.  Negative for depression, memory loss, substance abuse and suicidal ideas. The patient is not nervous/anxious and does not have insomnia.   All other systems reviewed and are negative.   Physical Exam: Estimated body mass index is 30.32 kg/m as calculated from the following:   Height as of this  encounter: 5' 5.5" (1.664 m).   Weight as of this encounter: 185 lb (83.9 kg). BP 132/80   Pulse 85   Temp (!) 96.6 F (35.9 C) (Temporal)   Resp 14   Ht 5' 5.5" (1.664 m)   Wt 185 lb (83.9 kg)   SpO2 99%   BMI 30.32 kg/m  General Appearance: Well nourished, in no apparent distress.  Eyes: PERRLA, EOMs, conjunctiva no swelling or erythema, normal fundi and vessels.  Sinuses: No Frontal/maxillary tenderness  ENT/Mouth: Ext aud canals clear, normal light reflex with TMs without erythema, bulging. Good dentition. No erythema, swelling, or exudate on post pharynx. Tonsils not swollen or erythematous. Hearing normal.  Neck: Supple, thyroid normal. No bruits  Respiratory: Respiratory effort normal, BS equal bilaterally without rales, rhonchi, wheezing or stridor.  Cardio: RRR without murmurs, rubs or gallops. Brisk peripheral pulses without edema.  Chest: symmetric, with normal excursions and percussion.  Breasts: Symmetric, without lumps, nipple discharge, retractions.  Abdomen: Soft, nontender, no guarding, rebound, hernias, masses, or organomegaly.  Lymphatics: Non tender without lymphadenopathy.  Genitourinary: normal external genitalia, vulva, vagina, cervix, uterus and adnexa, PAP: Pap smear done today. Musculoskeletal: Full ROM all peripheral extremities,5/5 strength, and normal gait,  neg straight leg. Full ROM left hip. + pin point left SI tenderness to palpation.  Skin: Warm, dry without rashes, lesions, ecchymosis. Neuro: Cranial nerves intact, reflexes equal bilaterally. Normal muscle tone, no cerebellar symptoms. Sensation intact.  Psych: Awake and oriented X 3, normal affect, Insight and Judgment appropriate.   EKG: defer Cardio  Vicie Mutters 10:04 AM [facility]

## 2020-03-28 ENCOUNTER — Ambulatory Visit: Payer: BLUE CROSS/BLUE SHIELD | Admitting: Physician Assistant

## 2020-03-28 ENCOUNTER — Other Ambulatory Visit: Payer: Self-pay

## 2020-03-28 ENCOUNTER — Encounter: Payer: Self-pay | Admitting: Physician Assistant

## 2020-03-28 VITALS — BP 132/80 | HR 85 | Temp 96.6°F | Resp 14 | Ht 65.5 in | Wt 185.0 lb

## 2020-03-28 DIAGNOSIS — Z1389 Encounter for screening for other disorder: Secondary | ICD-10-CM | POA: Diagnosis not present

## 2020-03-28 DIAGNOSIS — E7800 Pure hypercholesterolemia, unspecified: Secondary | ICD-10-CM

## 2020-03-28 DIAGNOSIS — I10 Essential (primary) hypertension: Secondary | ICD-10-CM

## 2020-03-28 DIAGNOSIS — Z79899 Other long term (current) drug therapy: Secondary | ICD-10-CM

## 2020-03-28 DIAGNOSIS — E559 Vitamin D deficiency, unspecified: Secondary | ICD-10-CM | POA: Diagnosis not present

## 2020-03-28 DIAGNOSIS — K5900 Constipation, unspecified: Secondary | ICD-10-CM

## 2020-03-28 DIAGNOSIS — I441 Atrioventricular block, second degree: Secondary | ICD-10-CM

## 2020-03-28 DIAGNOSIS — Z13220 Encounter for screening for lipoid disorders: Secondary | ICD-10-CM

## 2020-03-28 DIAGNOSIS — Z1329 Encounter for screening for other suspected endocrine disorder: Secondary | ICD-10-CM | POA: Diagnosis not present

## 2020-03-28 DIAGNOSIS — D126 Benign neoplasm of colon, unspecified: Secondary | ICD-10-CM

## 2020-03-28 DIAGNOSIS — R6889 Other general symptoms and signs: Secondary | ICD-10-CM

## 2020-03-28 DIAGNOSIS — I480 Paroxysmal atrial fibrillation: Secondary | ICD-10-CM

## 2020-03-28 DIAGNOSIS — Z130 Encounter for screening for diseases of the blood and blood-forming organs and certain disorders involving the immune mechanism: Secondary | ICD-10-CM | POA: Diagnosis not present

## 2020-03-28 DIAGNOSIS — Z0001 Encounter for general adult medical examination with abnormal findings: Secondary | ICD-10-CM

## 2020-03-28 DIAGNOSIS — M545 Low back pain, unspecified: Secondary | ICD-10-CM

## 2020-03-28 DIAGNOSIS — Z131 Encounter for screening for diabetes mellitus: Secondary | ICD-10-CM

## 2020-03-28 DIAGNOSIS — R7309 Other abnormal glucose: Secondary | ICD-10-CM

## 2020-03-28 DIAGNOSIS — G8929 Other chronic pain: Secondary | ICD-10-CM | POA: Diagnosis not present

## 2020-03-28 DIAGNOSIS — D649 Anemia, unspecified: Secondary | ICD-10-CM

## 2020-03-28 DIAGNOSIS — K219 Gastro-esophageal reflux disease without esophagitis: Secondary | ICD-10-CM

## 2020-03-28 DIAGNOSIS — M2021 Hallux rigidus, right foot: Secondary | ICD-10-CM

## 2020-03-28 DIAGNOSIS — Z124 Encounter for screening for malignant neoplasm of cervix: Secondary | ICD-10-CM | POA: Diagnosis not present

## 2020-03-28 DIAGNOSIS — M25512 Pain in left shoulder: Secondary | ICD-10-CM

## 2020-03-28 DIAGNOSIS — Z8711 Personal history of peptic ulcer disease: Secondary | ICD-10-CM

## 2020-03-28 MED ORDER — DEXAMETHASONE SODIUM PHOSPHATE 100 MG/10ML IJ SOLN: 10.0000 mg | Freq: Once | INTRAMUSCULAR | Status: DC

## 2020-03-28 NOTE — Patient Instructions (Signed)
Continue the zetia BUT change to at night with food 3 x a week and see if that helps Continue diet.   Your LDL could improve, ideally we want it under a 100.  Your LDL is the bad cholesterol that can lead to heart attack and stroke. To lower your number you can decrease your fatty foods, red meat, cheese, milk and increase fiber like whole grains and veggies. You can also add a fiber supplement like Citracel or Benefiber, these do not cause gas and bloating and are safe to use. Especially if you have a strong family history of heart disease or stroke or you have evidence of plaque on any imaging like a chest xray, we may discuss at your next office visit putting you on a medication to get your number below 100.

## 2020-03-29 ENCOUNTER — Ambulatory Visit: Payer: BC Managed Care – PPO | Admitting: Orthopedic Surgery

## 2020-03-29 ENCOUNTER — Ambulatory Visit: Payer: Self-pay

## 2020-03-29 DIAGNOSIS — G8929 Other chronic pain: Secondary | ICD-10-CM | POA: Diagnosis not present

## 2020-03-29 DIAGNOSIS — M25512 Pain in left shoulder: Secondary | ICD-10-CM | POA: Diagnosis not present

## 2020-03-29 DIAGNOSIS — M25511 Pain in right shoulder: Secondary | ICD-10-CM

## 2020-03-29 DIAGNOSIS — M19012 Primary osteoarthritis, left shoulder: Secondary | ICD-10-CM

## 2020-03-29 MED ORDER — MELOXICAM 15 MG PO TABS: 15.0000 mg | ORAL_TABLET | Freq: Every day | ORAL | 0 refills | Status: DC | PRN

## 2020-03-31 LAB — LIPID PANEL
Cholesterol: 163 mg/dL (ref <200)
HDL: 59 mg/dL (ref >=50)
LDL Cholesterol (Calc): 87 mg/dL (calc)
Non-HDL Cholesterol (Calc): 104 mg/dL (calc) (ref <130)
Total CHOL/HDL Ratio: 2.8 (calc) (ref <5.0)
Triglycerides: 76 mg/dL (ref <150)

## 2020-03-31 LAB — SEDIMENTATION RATE: Sed Rate: 6 mm/h (ref 0–30)

## 2020-03-31 LAB — ANTI-NUCLEAR AB-TITER (ANA TITER)
ANA TITER: 1:320 {titer} — ABNORMAL HIGH
ANA Titer 1: 1:80 {titer} — ABNORMAL HIGH

## 2020-03-31 LAB — ANTI-DNA ANTIBODY, DOUBLE-STRANDED: ds DNA Ab: <1 IU/mL

## 2020-03-31 LAB — HEMOGLOBIN A1C
Hgb A1c MFr Bld: 5.3 % of total Hgb (ref <5.7)
Mean Plasma Glucose: 105 (calc)
eAG (mmol/L): 5.8 (calc)

## 2020-03-31 LAB — TSH: TSH: 2.35 mIU/L (ref 0.40–4.50)

## 2020-03-31 LAB — CBC WITH DIFFERENTIAL/PLATELET
Absolute Monocytes: 433 cells/uL (ref 200–950)
Basophils Absolute: 21 cells/uL (ref 0–200)
Basophils Relative: 0.5 %
Eosinophils Absolute: 130 cells/uL (ref 15–500)
Eosinophils Relative: 3.1 %
HCT: 43.4 % (ref 35.0–45.0)
Hemoglobin: 14.1 g/dL (ref 11.7–15.5)
Lymphs Abs: 2066 cells/uL (ref 850–3900)
MCH: 30.3 pg (ref 27.0–33.0)
MCHC: 32.5 g/dL (ref 32.0–36.0)
MCV: 93.1 fL (ref 80.0–100.0)
MPV: 10.1 fL (ref 7.5–12.5)
Monocytes Relative: 10.3 %
Neutro Abs: 1550 cells/uL (ref 1500–7800)
Neutrophils Relative %: 36.9 %
Platelets: 246 10*3/uL (ref 140–400)
RBC: 4.66 10*6/uL (ref 3.80–5.10)
RDW: 13 % (ref 11.0–15.0)
Total Lymphocyte: 49.2 %
WBC: 4.2 10*3/uL (ref 3.8–10.8)

## 2020-03-31 LAB — URINALYSIS, ROUTINE W REFLEX MICROSCOPIC
Bilirubin Urine: NEGATIVE
Glucose, UA: NEGATIVE
Hgb urine dipstick: NEGATIVE
Ketones, ur: NEGATIVE
Leukocytes,Ua: NEGATIVE
Nitrite: NEGATIVE
Protein, ur: NEGATIVE
Specific Gravity, Urine: 1.013 (ref 1.001–1.03)
pH: 6 (ref 5.0–8.0)

## 2020-03-31 LAB — COMPLETE METABOLIC PANEL WITH GFR
AG Ratio: 1.4 (calc) (ref 1.0–2.5)
ALT: 14 U/L (ref 6–29)
AST: 15 U/L (ref 10–35)
Albumin: 4.2 g/dL (ref 3.6–5.1)
Alkaline phosphatase (APISO): 101 U/L (ref 37–153)
BUN: 10 mg/dL (ref 7–25)
CO2: 29 mmol/L (ref 20–32)
Calcium: 9.7 mg/dL (ref 8.6–10.4)
Chloride: 106 mmol/L (ref 98–110)
Creat: 0.68 mg/dL (ref 0.50–0.99)
GFR, Est African American: 109 mL/min/{1.73_m2} (ref >=60)
GFR, Est Non African American: 94 mL/min/{1.73_m2} (ref >=60)
Globulin: 3 g/dL (calc) (ref 1.9–3.7)
Glucose, Bld: 90 mg/dL (ref 65–99)
Potassium: 4.8 mmol/L (ref 3.5–5.3)
Sodium: 142 mmol/L (ref 135–146)
Total Bilirubin: 0.5 mg/dL (ref 0.2–1.2)
Total Protein: 7.2 g/dL (ref 6.1–8.1)

## 2020-03-31 LAB — MICROALBUMIN / CREATININE URINE RATIO
Creatinine, Urine: 76 mg/dL (ref 20–275)
Microalb, Ur: <0.2 mg/dL

## 2020-03-31 LAB — IRON, TOTAL/TOTAL IRON BINDING CAP
%SAT: 22 % (calc) (ref 16–45)
Iron: 75 ug/dL (ref 45–160)
TIBC: 341 mcg/dL (calc) (ref 250–450)

## 2020-03-31 LAB — VITAMIN B12: Vitamin B-12: 511 pg/mL (ref 200–1100)

## 2020-03-31 LAB — MAGNESIUM: Magnesium: 2 mg/dL (ref 1.5–2.5)

## 2020-03-31 LAB — RHEUMATOID FACTOR: Rheumatoid fact SerPl-aCnc: <14 IU/mL (ref <14)

## 2020-03-31 LAB — URIC ACID: Uric Acid, Serum: 4.3 mg/dL (ref 2.5–7.0)

## 2020-03-31 LAB — ANA: Anti Nuclear Antibody (ANA): POSITIVE — AB

## 2020-03-31 LAB — VITAMIN D 25 HYDROXY (VIT D DEFICIENCY, FRACTURES): Vit D, 25-Hydroxy: 93 ng/mL (ref 30–100)

## 2020-03-31 LAB — FERRITIN: Ferritin: 87 ng/mL (ref 16–288)

## 2020-04-02 ENCOUNTER — Encounter: Payer: Self-pay | Admitting: Orthopedic Surgery

## 2020-04-02 DIAGNOSIS — M19012 Primary osteoarthritis, left shoulder: Secondary | ICD-10-CM | POA: Diagnosis not present

## 2020-04-02 MED ORDER — LIDOCAINE HCL 1 % IJ SOLN
5.0000 mL | INTRAMUSCULAR | Status: AC | PRN
  Administered 2020-04-02: 5 mL

## 2020-04-02 MED ORDER — BUPIVACAINE HCL 0.5 % IJ SOLN
9.0000 mL | INTRAMUSCULAR | Status: AC | PRN
Start: 2020-04-02 — End: 2020-04-02
  Administered 2020-04-02: 9 mL via INTRA_ARTICULAR

## 2020-04-02 MED ORDER — METHYLPREDNISOLONE ACETATE 40 MG/ML IJ SUSP
40.0000 mg | INTRAMUSCULAR | Status: AC | PRN
  Administered 2020-04-02: 40 mg via INTRA_ARTICULAR

## 2020-04-02 NOTE — Progress Notes (Signed)
Office Visit Note   Patient: Alyssa Dudley           Date of Birth: 1956/07/25           MRN: [id number] Visit Date: 03/29/2020 Requested by: Vicie Mutters, PA-C [address] [place],  [place] [address] PCP: Unk Pinto, MD  Subjective: Chief Complaint  Patient presents with  . Right Shoulder - Pain  . Left Shoulder - Pain    HPI: Alyssa Dudley is a 64 y.o. female who presents to the office complaining of bilateral shoulder pain.  Patient localizes pain to the superior/lateral aspect of the left shoulder.  She notes 90% of the pain that she experiences is in her left shoulder.  She denies any radiation of pain.  No neck pain or numbness/tingling.  She is not taking any medications for pain.  She denies any injury leading to the onset of pain.  She works as a Photographer.  Pain is worse when she lays on her left side or when she tries to lift with her left arm.  Pain wakes her up at night.  She has no history of shoulder surgery or MRI..                ROS:  All systems reviewed are negative as they relate to the chief complaint within the history of present illness.  Patient denies fevers or chills.  Assessment & Plan: Visit Diagnoses:  1. Chronic pain of both shoulders   2. Primary osteoarthritis, left shoulder     Plan: Patient is a 64 year old female who presents complaining of bilateral shoulder pain.  90% of her pain is in her left shoulder.  She localizes pain to the superior lateral aspect of the left shoulder with no radiation.  She has not been taking any medication for pain.  Pain wakes her up at night.  Radiographs of the left shoulder reveal moderate osteoarthritis with no such findings in the right shoulder.  She has slightly diminished range of motion of the left shoulder compared with the right shoulder but overall it is well preserved given her arthritic findings.  Prescribed Mobic to take when pain flares up.  Left shoulder glenohumeral  injection administered today.  Patient tolerated procedure well.  Follow-up as needed.  Follow-Up Instructions: No follow-ups on file.   Orders:  Orders Placed This Encounter  Procedures  . XR Shoulder Right  . XR Shoulder Left   Meds ordered this encounter  Medications  . meloxicam (MOBIC) 15 MG tablet    Sig: Take 1 tablet (15 mg total) by mouth daily as needed for pain.    Dispense:  30 tablet    Refill:  0      Procedures: Large Joint Inj: L glenohumeral on 04/02/2020 4:54 PM Indications: diagnostic evaluation and pain Details: 18 G 1.5 in needle, posterior approach  Arthrogram: No  Medications: 9 mL bupivacaine 0.5 %; 40 mg methylPREDNISolone acetate 40 MG/ML; 5 mL lidocaine 1 % Outcome: tolerated well, no immediate complications Procedure, treatment alternatives, risks and benefits explained, specific risks discussed. Consent was given by the patient. Immediately prior to procedure a time out was called to verify the correct patient, procedure, equipment, support staff and site/side marked as required. Patient was prepped and draped in the usual sterile fashion.       Clinical Data: No additional findings.  Objective: Vital Signs: There were no vitals taken for this visit.  Physical Exam:  Constitutional: Patient appears well-developed  HEENT:  Head: Normocephalic Eyes:EOM are normal Neck: Normal range of motion Cardiovascular: Normal rate Pulmonary/chest: Effort normal Neurologic: Patient is alert Skin: Skin is warm Psychiatric: Patient has normal mood and affect  Ortho Exam:  Left shoulder exam Able to forward flex and abduct shoulder overhead.  Mild loss of forward flexion and abduction compared with the contralateral side but overall range of motion is well-preserved.  5 degree loss of ER relative to the other shoulder.  Good endpoint with ER No TTP over the [facility] joint or bicipital groove Good subscapularis, supraspinatus, and infraspinatus  strength Negative Hawkins impingement 5/5 grip strength, forearm pronation/supination, and bicep strength  Specialty Comments:  No specialty comments available.  Imaging: No results found.   PMFS History: Patient Active Problem List   Diagnosis Date Noted  . Meralgia paraesthetica, left 02/13/2018  . Plantar fasciitis of left foot 02/13/2018  . Lateral epicondylitis of left elbow 02/13/2018  . Overweight (BMI 25.0-29.9) 02/25/2015  . HTN (hypertension) 08/17/2014  . Abnormal glucose 08/17/2014  . Medication management 08/17/2014  . Atrial fibrillation ([place]) 06/07/2014  . Hyperlipidemia   . Vitamin D deficiency   . Mobitz type 2 second degree and high-grade heart block 01/07/2013  . Family history of malignant neoplasm of gastrointestinal tract 10/15/2011  . Benign neoplasm of colon 10/15/2011  . GERD 01/10/2008  . Constipation 01/10/2008  . History of peptic ulcer disease 01/10/2008   Past Medical History:  Diagnosis Date  . Allergy    once a year   . Hyperlipidemia    borderline- no meds   . Pacemaker   . Paroxysmal atrial fibrillation ([place]) 6/15   detected on ppm interrogation  . PONV (postoperative nausea and vomiting)    has vomitted in past  . Second degree Mobitz II AV block   . Vitamin D deficiency     Family History  Problem Relation Age of Onset  . Colon cancer Sister 35  . Anemia Sister   . Heart disease Father   . Hyperlipidemia Father   . Prostate cancer Brother 26  . Multiple myeloma Son 23  . Hypertension Son   . Kidney disease Son   . Multiple myeloma Maternal Aunt   . Cervical cancer Mother   . Hypertension Mother   . Diabetes Mother   . HIV/AIDS Brother   . Colon polyps Neg Hx   . Rectal cancer Neg Hx   . Stomach cancer Neg Hx     Past Surgical History:  Procedure Laterality Date  . BREAST CYST EXCISION  1980  . COLONOSCOPY    . EXTRACORPOREAL SHOCK WAVE LITHOTRIPSY Right 12/28/2019   Procedure: RIGHT EXTRACORPOREAL SHOCK WAVE  LITHOTRIPSY (ESWL);  Surgeon: Raynelle Bring, MD;  Location: [facility];  Service: Urology;  Laterality: Right;  . PERMANENT PACEMAKER INSERTION N/A 01/13/2013   SJM Acc[employer] DR RF implanted by Dr [name] for mobitz II AV block  . PLANTAR FASCIECTOMY    . POLYPECTOMY    . WRIST SURGERY Right 2011   removed cyst   Social History   Occupational History  . Not on file  Tobacco Use  . Smoking status: Never Smoker  . Smokeless tobacco: Never Used  Vaping Use  . Vaping Use: Never used  Substance and Sexual Activity  . Alcohol use: No  . Drug use: No  . Sexual activity: Not Currently

## 2020-04-03 ENCOUNTER — Other Ambulatory Visit: Payer: Self-pay | Admitting: Physician Assistant

## 2020-04-06 LAB — PAP, TP IMAGING W/ HPV RNA, RFLX HPV TYPE 16,18/45: HPV DNA High Risk: DETECTED — AB

## 2020-04-06 LAB — HPV TYPE 16 AND 18/45 RNA
HPV Type 16 RNA: NOT DETECTED
HPV Type 18/45 RNA: NOT DETECTED

## 2020-04-06 LAB — PAP, TP IMAGING, WNL RFLX HPV

## 2020-06-06 DIAGNOSIS — N200 Calculus of kidney: Secondary | ICD-10-CM | POA: Diagnosis not present

## 2020-06-16 IMAGING — CT CT ABD-PELV W/O CM
2 of 4 series · 11 of 46 positions shown, 12 images · non-contrast
Comparison: CT scan of April 05, 2011.

CLINICAL DATA: Right lower quadrant abdominal pain for 1 month.

EXAM:
CT ABDOMEN AND PELVIS WITHOUT CONTRAST
TECHNIQUE: Multidetector CT imaging of the abdomen and pelvis was performed
following the standard protocol without IV contrast.

[Series 2: renal stone 5.00 br40 s3 axial · axial · 0.54mm/px · z∈[+1204,+1564]mm · 8 of 88 slices shown, 9 images]
[im 8/88  soft-tissue]
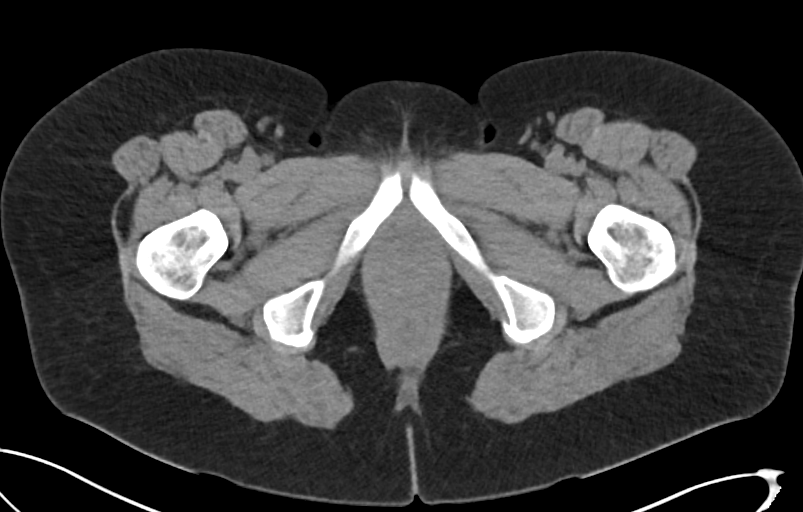
[im 8/88  bone]
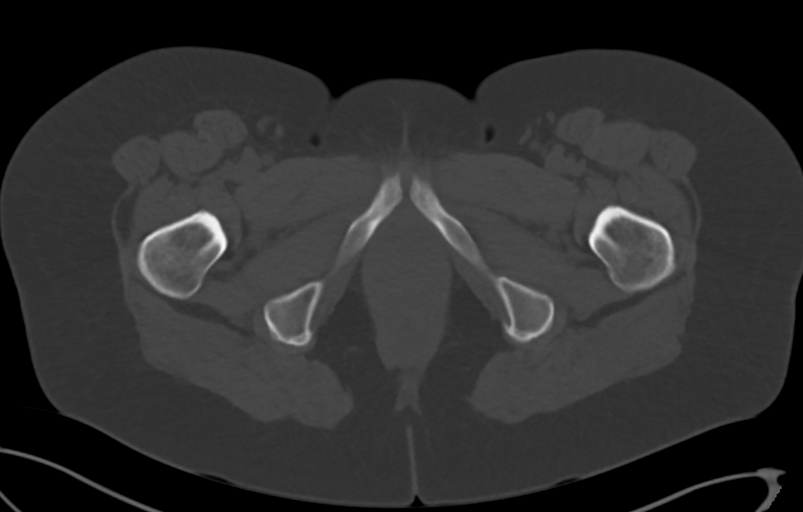
[im 19/88  soft-tissue]
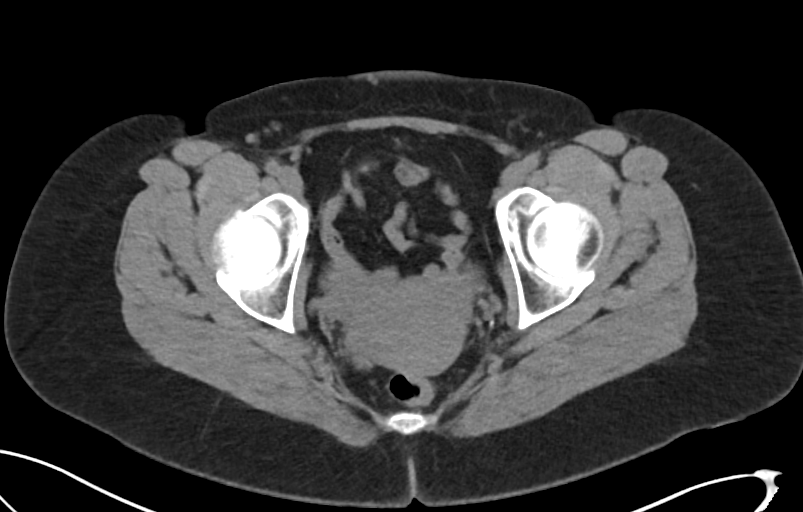
[im 27/88  soft-tissue]
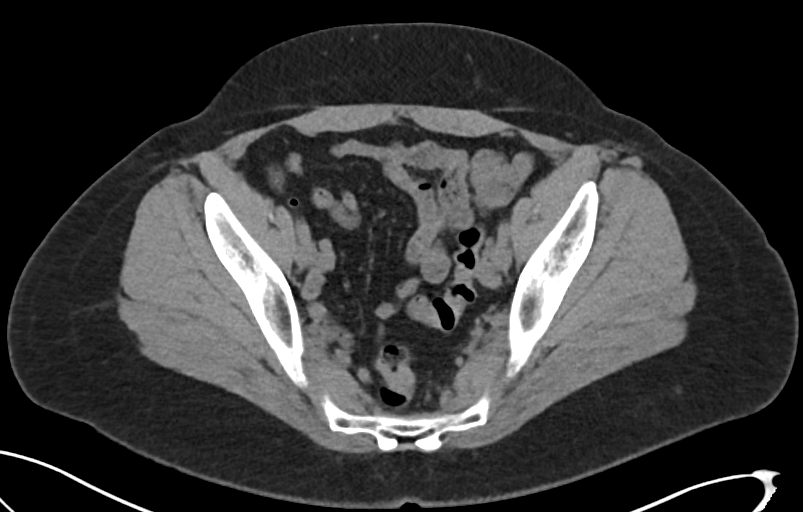
[im 38/88  soft-tissue]
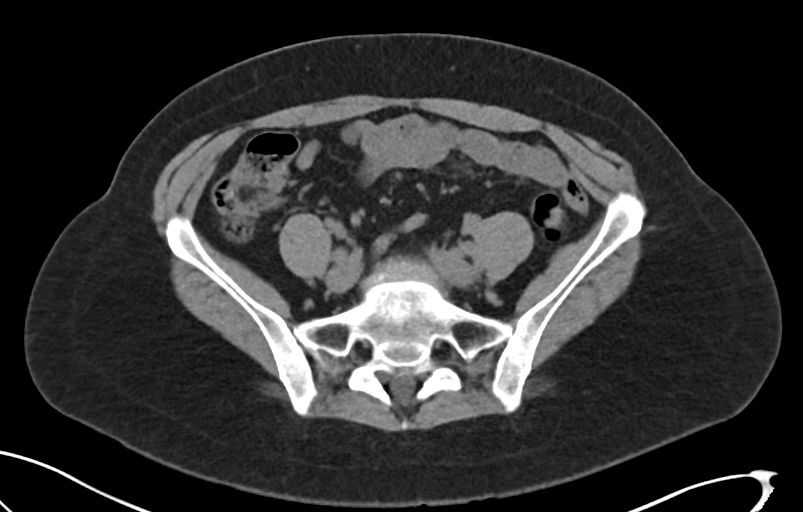
[im 50/88  soft-tissue]
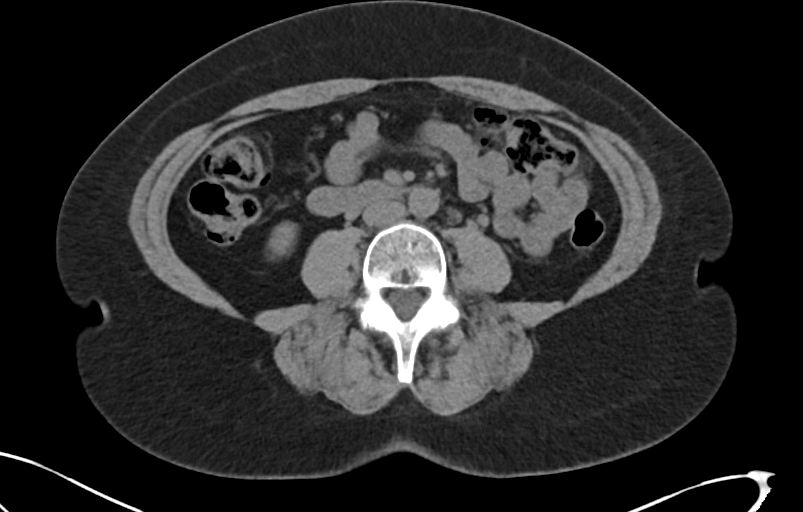
[im 61/88  soft-tissue]
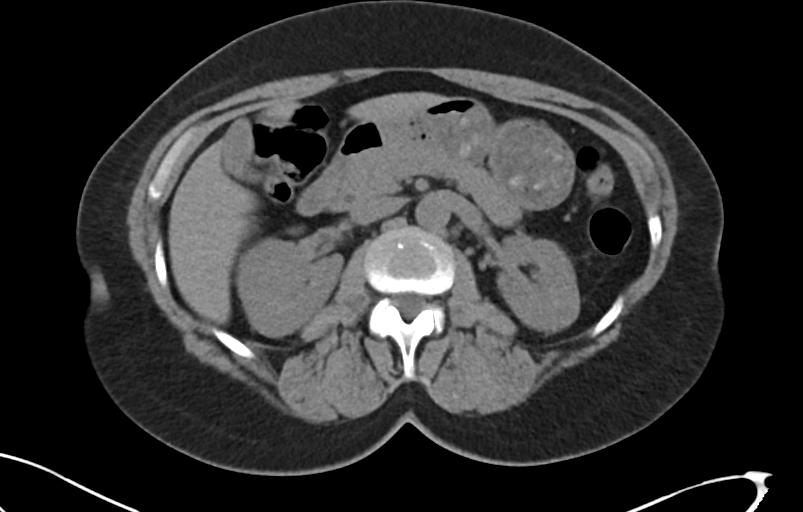
[im 69/88  soft-tissue]
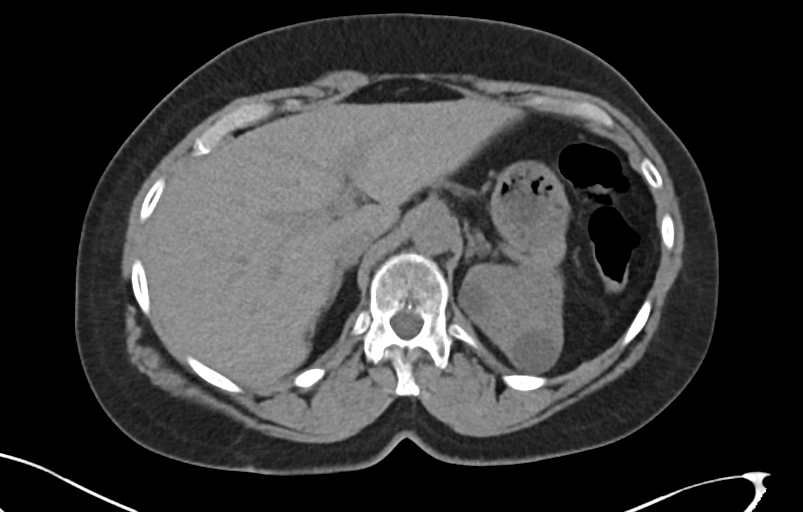
[im 80/88  soft-tissue]
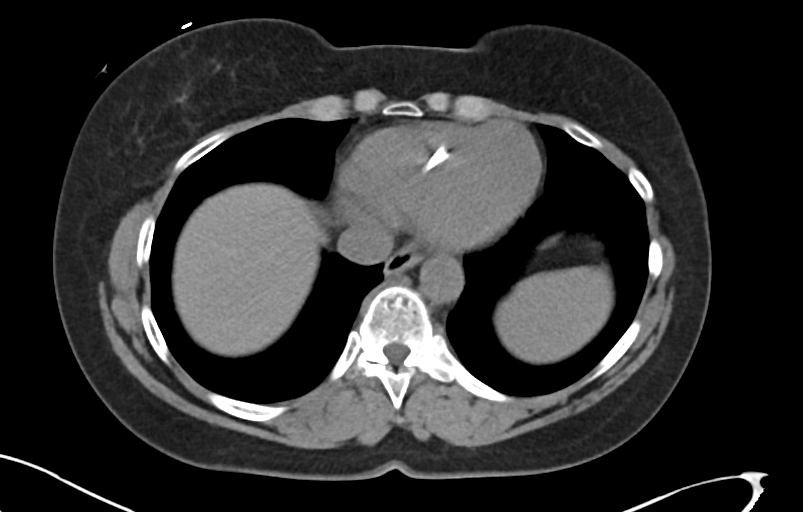

[Series 6: renal stone 2.00 br40 s3 cor · coronal · 0.85mm/px · 3 of 139 slices shown]
[im 47/139  soft-tissue]
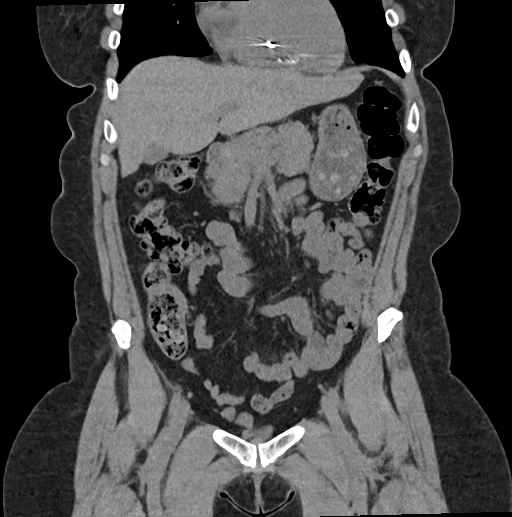
[im 62/139  soft-tissue]
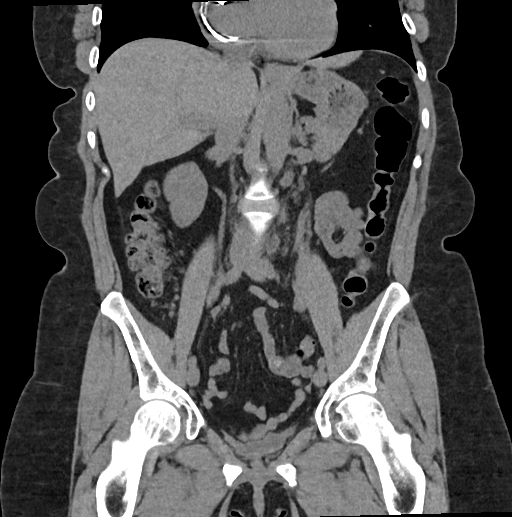
[im 77/139  soft-tissue]
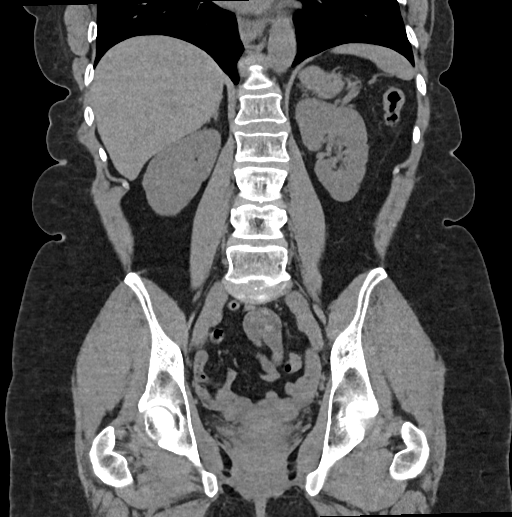

[11 of 46 positions shown; findings below may reference images not displayed]

FINDINGS: Lower chest: No acute abnormality.

Hepatobiliary: No focal liver abnormality is seen. No gallstones,
gallbladder wall thickening, or biliary dilatation.

Pancreas: Unremarkable. No pancreatic ductal dilatation or
surrounding inflammatory changes.

Spleen: Normal in size without focal abnormality.

Adrenals/Urinary Tract: Adrenal glands appear normal. 7 mm
nonobstructive calculus is noted in lower pole collecting system of
right kidney. Stable multiple left renal cysts are noted. No
hydronephrosis or renal obstruction is noted. Urinary bladder is
decompressed.

Stomach/Bowel: Stomach is within normal limits. Appendix appears
normal. No evidence of bowel wall thickening, distention, or
inflammatory changes.

Vascular/Lymphatic: No significant vascular findings are present. No
enlarged abdominal or pelvic lymph nodes.

Reproductive: Uterus and bilateral adnexa are unremarkable.

Other: No abdominal wall hernia or abnormality. No abdominopelvic
ascites.

Musculoskeletal: No acute or significant osseous findings.
IMPRESSION: Nonobstructive right renal calculus. No hydronephrosis or renal
obstruction is noted.

Stable left renal cyst.

No other abnormality seen in the abdomen or pelvis.

## 2020-07-09 ENCOUNTER — Other Ambulatory Visit: Payer: Self-pay | Admitting: Adult Health

## 2020-07-25 ENCOUNTER — Other Ambulatory Visit: Payer: Self-pay

## 2020-07-25 ENCOUNTER — Ambulatory Visit (INDEPENDENT_AMBULATORY_CARE_PROVIDER_SITE_OTHER): Payer: BC Managed Care – PPO

## 2020-07-25 VITALS — BP 126/78 | Temp 97.8°F | Wt 188.0 lb

## 2020-07-25 DIAGNOSIS — Z23 Encounter for immunization: Secondary | ICD-10-CM | POA: Diagnosis not present

## 2020-07-29 ENCOUNTER — Ambulatory Visit: Payer: BC Managed Care – PPO | Admitting: Orthopedic Surgery

## 2020-07-29 ENCOUNTER — Other Ambulatory Visit: Payer: Self-pay

## 2020-07-29 DIAGNOSIS — M19012 Primary osteoarthritis, left shoulder: Secondary | ICD-10-CM

## 2020-07-30 ENCOUNTER — Encounter: Payer: Self-pay | Admitting: Orthopedic Surgery

## 2020-07-30 NOTE — Progress Notes (Signed)
Office Visit Note   Patient: Alyssa Dudley           Date of Birth: 1956/08/11           MRN: [id number] Visit Date: 07/29/2020 Requested by: Unk Pinto, [place] [address],  [place] [id number] PCP: Unk Pinto, MD  Subjective: Chief Complaint  Patient presents with  . Right Shoulder - Follow-up  . Constipation    HPI: Alyssa Dudley is a 64 year old patient with bilateral shoulder pain right worse than left.  Had right shoulder glenohumeral injection 03/29/2020.  In general she has been doing well.  She is right-hand dominant.  She has pain in both shoulders.  Currently she has a manageable level of pain in her shoulders.              ROS: All systems reviewed are negative as they relate to the chief complaint within the history of present illness.  Patient denies  fevers or chills.   Assessment & Plan: Visit Diagnoses:  1. Primary osteoarthritis, left shoulder     Plan: Impression is bilateral shoulder pain with good relief from the pain in the right shoulder with glenohumeral injection 03/29/2020.  Plan at this time is to follow-up as needed.  I think we could consider injection in either shoulder in the future if symptoms recur but currently she is in a very livable situation.  Follow-up as needed.  She has excellent strength as well as good range of motion in both shoulders.  Follow-Up Instructions: Return if symptoms worsen or fail to improve.   Orders:  No orders of the defined types were placed in this encounter.  No orders of the defined types were placed in this encounter.     Procedures: No procedures performed   Clinical Data: No additional findings.  Objective: Vital Signs: There were no vitals taken for this visit.  Physical Exam:   Constitutional: Patient appears well-developed HEENT:  Head: Normocephalic Eyes:EOM are normal Neck: Normal range of motion Cardiovascular: Normal rate Pulmonary/chest: Effort  normal Neurologic: Patient is alert Skin: Skin is warm Psychiatric: Patient has normal mood and affect    Ortho Exam: Ortho exam demonstrates full active and passive range of motion of the cervical spine.  She has no restriction of external rotation 15 degrees of abduction with external rotation on the right and left hand side to about 40 degrees.  Rotator cuff strength is good bilaterally demonstrate supraspinatus subscap muscle testing.  Not too much coarse grinding or crepitus today on exam.  No AC joint tenderness to direct palpation.  Specialty Comments:  No specialty comments available.  Imaging: No results found.   PMFS History: Patient Active Problem List   Diagnosis Date Noted  . Meralgia paraesthetica, left 02/13/2018  . Plantar fasciitis of left foot 02/13/2018  . Lateral epicondylitis of left elbow 02/13/2018  . Overweight (BMI 25.0-29.9) 02/25/2015  . HTN (hypertension) 08/17/2014  . Abnormal glucose 08/17/2014  . Medication management 08/17/2014  . Atrial fibrillation ([place]) 06/07/2014  . Hyperlipidemia   . Vitamin D deficiency   . Mobitz type 2 second degree and high-grade heart block 01/07/2013  . Family history of malignant neoplasm of gastrointestinal tract 10/15/2011  . Benign neoplasm of colon 10/15/2011  . GERD 01/10/2008  . Constipation 01/10/2008  . History of peptic ulcer disease 01/10/2008   Past Medical History:  Diagnosis Date  . Allergy    once a year   . Hyperlipidemia    borderline-  no meds   . Pacemaker   . Paroxysmal atrial fibrillation ([place]) 6/15   detected on ppm interrogation  . PONV (postoperative nausea and vomiting)    has vomitted in past  . Second degree Mobitz II AV block   . Vitamin D deficiency     Family History  Problem Relation Age of Onset  . Colon cancer Sister 58  . Anemia Sister   . Heart disease Father   . Hyperlipidemia Father   . Prostate cancer Brother 49  . Multiple myeloma Son 17  . Hypertension Son   .  Kidney disease Son   . Multiple myeloma Maternal Aunt   . Cervical cancer Mother   . Hypertension Mother   . Diabetes Mother   . HIV/AIDS Brother   . Colon polyps Neg Hx   . Rectal cancer Neg Hx   . Stomach cancer Neg Hx     Past Surgical History:  Procedure Laterality Date  . BREAST CYST EXCISION  1980  . COLONOSCOPY    . EXTRACORPOREAL SHOCK WAVE LITHOTRIPSY Right 12/28/2019   Procedure: RIGHT EXTRACORPOREAL SHOCK WAVE LITHOTRIPSY (ESWL);  Surgeon: Raynelle Bring, MD;  Location: [facility];  Service: Urology;  Laterality: Right;  . PERMANENT PACEMAKER INSERTION N/A 01/13/2013   SJM Accent DR RF implanted by Dr [name] for mobitz II AV block  . PLANTAR FASCIECTOMY    . POLYPECTOMY    . WRIST SURGERY Right 2011   removed cyst   Social History   Occupational History  . Not on file  Tobacco Use  . Smoking status: Never Smoker  . Smokeless tobacco: Never Used  Vaping Use  . Vaping Use: Never used  Substance and Sexual Activity  . Alcohol use: No  . Drug use: No  . Sexual activity: Not Currently

## 2020-08-03 ENCOUNTER — Other Ambulatory Visit: Payer: Self-pay | Admitting: Adult Health

## 2020-08-11 ENCOUNTER — Ambulatory Visit (INDEPENDENT_AMBULATORY_CARE_PROVIDER_SITE_OTHER): Payer: BC Managed Care – PPO

## 2020-08-11 DIAGNOSIS — I441 Atrioventricular block, second degree: Secondary | ICD-10-CM | POA: Diagnosis not present

## 2020-08-11 LAB — CUP PACEART REMOTE DEVICE CHECK
Battery Remaining Longevity: 98 mo
Battery Remaining Percentage: 73 %
Battery Voltage: 2.92 V
Brady Statistic AP VP Percent: 79 %
Brady Statistic AP VS Percent: 1 %
Brady Statistic AS VP Percent: 21 %
Brady Statistic AS VS Percent: 1 %
Brady Statistic RA Percent Paced: 79 %
Brady Statistic RV Percent Paced: 99 %
Date Time Interrogation Session: 20211103090436
Implantable Lead Implant Date: 20140408
Implantable Lead Implant Date: 20140408
Implantable Lead Location: 753859
Implantable Lead Location: 753860
Implantable Lead Model: 1944
Implantable Lead Model: 1948
Implantable Pulse Generator Implant Date: 20140408
Lead Channel Impedance Value: 510 Ohm
Lead Channel Impedance Value: 650 Ohm
Lead Channel Pacing Threshold Amplitude: 0.375 V
Lead Channel Pacing Threshold Amplitude: 0.625 V
Lead Channel Pacing Threshold Pulse Width: 0.4 ms
Lead Channel Pacing Threshold Pulse Width: 0.4 ms
Lead Channel Sensing Intrinsic Amplitude: 12 mV
Lead Channel Sensing Intrinsic Amplitude: 4 mV
Lead Channel Setting Pacing Amplitude: 0.875
Lead Channel Setting Pacing Amplitude: 1.375
Lead Channel Setting Pacing Pulse Width: 0.4 ms
Lead Channel Setting Sensing Sensitivity: 2 mV
Pulse Gen Model: 2210
Pulse Gen Serial Number: 7457437

## 2020-08-11 NOTE — Progress Notes (Signed)
Remote pacemaker transmission.   

## 2020-08-26 DIAGNOSIS — Z1231 Encounter for screening mammogram for malignant neoplasm of breast: Secondary | ICD-10-CM | POA: Diagnosis not present

## 2020-08-26 LAB — HM MAMMOGRAPHY

## 2020-09-15 ENCOUNTER — Encounter: Payer: Self-pay | Admitting: Internal Medicine

## 2020-09-21 ENCOUNTER — Other Ambulatory Visit: Payer: Self-pay

## 2020-09-21 ENCOUNTER — Ambulatory Visit: Payer: BC Managed Care – PPO | Admitting: Adult Health Nurse Practitioner

## 2020-09-21 ENCOUNTER — Encounter: Payer: Self-pay | Admitting: Adult Health Nurse Practitioner

## 2020-09-21 VITALS — BP 120/80 | Temp 98.1°F | Ht 65.5 in | Wt 167.0 lb

## 2020-09-21 DIAGNOSIS — E559 Vitamin D deficiency, unspecified: Secondary | ICD-10-CM

## 2020-09-21 DIAGNOSIS — Z79899 Other long term (current) drug therapy: Secondary | ICD-10-CM

## 2020-09-21 DIAGNOSIS — E7800 Pure hypercholesterolemia, unspecified: Secondary | ICD-10-CM | POA: Diagnosis not present

## 2020-09-21 DIAGNOSIS — E663 Overweight: Secondary | ICD-10-CM

## 2020-09-21 DIAGNOSIS — I480 Paroxysmal atrial fibrillation: Secondary | ICD-10-CM

## 2020-09-21 DIAGNOSIS — K219 Gastro-esophageal reflux disease without esophagitis: Secondary | ICD-10-CM

## 2020-09-21 DIAGNOSIS — M19012 Primary osteoarthritis, left shoulder: Secondary | ICD-10-CM

## 2020-09-21 DIAGNOSIS — I10 Essential (primary) hypertension: Secondary | ICD-10-CM

## 2020-09-21 DIAGNOSIS — M25511 Pain in right shoulder: Secondary | ICD-10-CM

## 2020-09-21 DIAGNOSIS — Z8711 Personal history of peptic ulcer disease: Secondary | ICD-10-CM

## 2020-09-21 DIAGNOSIS — G8929 Other chronic pain: Secondary | ICD-10-CM

## 2020-09-21 DIAGNOSIS — M545 Low back pain, unspecified: Secondary | ICD-10-CM

## 2020-09-21 DIAGNOSIS — M25512 Pain in left shoulder: Secondary | ICD-10-CM

## 2020-09-21 DIAGNOSIS — I441 Atrioventricular block, second degree: Secondary | ICD-10-CM

## 2020-09-21 DIAGNOSIS — D126 Benign neoplasm of colon, unspecified: Secondary | ICD-10-CM

## 2020-09-21 DIAGNOSIS — D649 Anemia, unspecified: Secondary | ICD-10-CM

## 2020-09-21 DIAGNOSIS — R7309 Other abnormal glucose: Secondary | ICD-10-CM

## 2020-09-21 MED ORDER — MELOXICAM 15 MG PO TABS: 15.0000 mg | ORAL_TABLET | Freq: Every day | ORAL | 0 refills | Status: DC | PRN

## 2020-09-21 MED ORDER — VITAMIN D (ERGOCALCIFEROL) 1.25 MG (50000 UNIT) PO CAPS: ORAL_CAPSULE | ORAL | 1 refills | Status: DC

## 2020-09-21 NOTE — Patient Instructions (Addendum)
   You may use that meloxicam 15mg  once a day as needed for your arthritis pain.  Just take medication with food to prevent any stomach upset.    GENERAL HEALTH GOALS  Know what a healthy weight is for you (roughly BMI <25) and aim to maintain this  Aim for 7+ servings of fruits and vegetables daily  70-80+ fluid ounces of water or unsweet tea for healthy kidneys  Limit to max 1 drink of alcohol per day; avoid smoking/tobacco  Limit animal fats in diet for cholesterol and heart health - choose grass fed whenever available  Avoid highly processed foods, and foods high in saturated/trans fats  Aim for low stress - take time to unwind and care for your mental health  Aim for 150 min of moderate intensity exercise weekly for heart health, and weights twice weekly for bone health  Aim for 7-9 hours of sleep daily

## 2020-09-21 NOTE — Progress Notes (Signed)
FOLLOW UP 3 MONTH   Assessment and Plan:  Essential hypertension Continue medication: Atenolol 63m  Monitor blood pressure at home; call if consistently over 130/80 Continue DASH diet.   Reminder to go to the ER if any CP, SOB, nausea, dizziness, severe HA, changes vision/speech, left arm numbness and tingling and jaw pain.  Paroxysmal atrial fibrillation (HCC) CHADSASC is now 63 since she is being treated for HTN- not on anticoag Followed by cardiology- may want to discuss- will likely need with turning 64 at least  Mobitz type 2 second degree and high-grade heart block Pacemaker in place, continue follow up with Dr. ARayann Heman Gastroesophageal reflux disease, esophagitis presence not specified Well managed on current medications; PRN OTC agents Discussed diet, avoiding triggers and other lifestyle changes  Benign neoplasm of colon, unspecified part of colon UTD  Vitamin D deficiency Continue supplementation for goal of 70-100 Check vitamin D level  Overweight (BMI 25.0-29.9) Long discussion about weight loss, diet, and exercise Recommended diet heavy in fruits and veggies and low in animal meats, cheeses, and dairy products, appropriate calorie intake Patient will work on cutting down on sugary snacks Follow up at next visit  Medication management Continued  Pure hypercholesterolemia Currently at goal by lifestyle modification Continue low cholesterol diet and exercise.  Check lipid panel.   History of peptic ulcer disease Avoid NSAIDS daily  Abnormal glucose Discussed diet/exercise, weight management  A1C defer today  Anemia, unspecified type No supplementation Defer check today, asymptomatic    Further disposition pending results if labs check today. Discussed med's effects and SE's.   Over 30 minutes of face to face interview, exam, counseling, chart review, and critical decision making was performed.    Future Appointments  Date Time Provider DGattman 09/21/2020  8:45 AM MGarnet Sierras NP [facility] None  11/10/2020  7:10 AM [facility] DEVICE REMOTES [facility]  02/09/2021  7:10 AM [facility] DEVICE REMOTES [facility]  03/30/2021  9:00 AM Dawn Kiper, KDanton Sewer NP [facility] None  05/11/2021  7:10 AM [facility] DEVICE REMOTES [facility]  08/10/2021  7:10 AM [facility] DEVICE REMOTES [facility]     HPI  This very nice 64y.o.female presents for follow up 3 month and has GERD; Constipation; History of peptic ulcer disease; Family history of malignant neoplasm of gastrointestinal tract; Benign neoplasm of colon; Mobitz type 2 second degree and high-grade heart block; Hyperlipidemia; Vitamin D deficiency; Atrial fibrillation ([place]; HTN (hypertension); Abnormal glucose; Medication management; Overweight (BMI 25.0-29.9); Meralgia paraesthetica, left; Plantar fasciitis of left foot; and Lateral epicondylitis of left elbow on their problem list.   She reports today she is doing well.  She does not have any health or medication concerns.  She has Afib and mobitz type 2 with pacemaker placement in 2014, follows with Dr. ARayann Hemanand MLeonides Schanz low risk CHADSVASC 2 and not on anticoagulation. Patient reports no complaints at this time.   She had left and right shoulder Xray 2013 showed DJD. She received an injection for this and it has been doing well since.    BMI is There is no height or weight on file to calculate BMI., she has been working on diet and exercise.  Wt Readings from Last 3 Encounters:  07/25/20 188 lb (85.3 kg)  03/28/20 185 lb (83.9 kg)  01/05/20 183 lb (83 kg)   Her blood pressure has been controlled at home, today their BP is    She does workout. She denies chest pain, shortness of breath, dizziness.  She is not on cholesterol medication, she tried zetia but states she had diarrhea with that, taking 3 x a week. . Her cholesterol is not at goal. The cholesterol last visit  was:   Lab Results  Component Value Date   CHOL 163 03/28/2020   HDL 59 03/28/2020   LDLCALC 87 03/28/2020   TRIG 76 03/28/2020   CHOLHDL 2.8 03/28/2020    She has been working on diet and exercise for glucose management, and denies foot ulcerations, increased appetite, nausea, paresthesia of the feet, polydipsia, polyuria, visual disturbances and vomiting. Last A1C in the office was:  Lab Results  Component Value Date   HGBA1C 5.3 03/28/2020   Patient is on Vitamin D supplement, 1 capsule twice a week Lab Results  Component Value Date   VD25OH 41 03/28/2020       Current Medications:  Current Outpatient Medications on File Prior to Visit  Medication Sig Dispense Refill  . atenolol (TENORMIN) 50 MG tablet Take 1 tablet Daily for BP 90 tablet 3  . meloxicam (MOBIC) 15 MG tablet Take 1 tablet (15 mg total) by mouth daily as needed for pain. 30 tablet 0  . triamcinolone cream (KENALOG) 0.1 % APPLY EXTERNALLY TO THE AFFECTED AREA THREE TIMES DAILY AS NEEDED FOR RASH 30 g 3  . Vitamin D, Ergocalciferol, (DRISDOL) 1.25 MG (50000 UNIT) CAPS capsule TAKE ONE CAPSULE BY MOUTH 2 TIMES A WEEK 60 capsule 0   Current Facility-Administered Medications on File Prior to Visit  Medication Dose Route Frequency Provider Last Rate Last Admin  . dexamethasone (DECADRON) injection 10 mg  10 mg Intramuscular Once Vladimir Crofts, PA-C       Health Maintenance:   Immunization History  Administered Date(s) Administered  . Influenza Inj Mdck Quad With Preservative 07/26/2017, 08/11/2018, 07/13/2019, 07/25/2020  . Influenza Split 06/30/2013, 07/20/2014, 07/21/2015  . Influenza,inj,quad, With Preservative 07/05/2016  . PFIZER SARS-COV-2 Vaccination 12/05/2019, 12/26/2019  . PPD Test 02/22/2014, 05/10/2015  . Tdap 10/09/2011  . Zoster Recombinat (Shingrix) 01/09/2017, 02/06/2018, 02/06/2018   Colonoscopy:05/2018 Mammo: 08/2019  BMD: 2015 WNL will wait until closer to 66 Pap/ Pelvic: 2018 WNL -  HPV neg DUE AB [place] 2011 CXR 2014 Echo 01/2013  EYE: Dr. Peter Garter, last 2018, wears glasses Dentist: Dr. Glynn Octave, Q 68month  Allergies: No Known Allergies Medical History:  has GERD; Constipation; History of peptic ulcer disease; Family history of malignant neoplasm of gastrointestinal tract; Benign neoplasm of colon; Mobitz type 2 second degree and high-grade heart block; Hyperlipidemia; Vitamin D deficiency; Atrial fibrillation ([place]; HTN (hypertension); Abnormal glucose; Medication management; Overweight (BMI 25.0-29.9); Meralgia paraesthetica, left; Plantar fasciitis of left foot; and Lateral epicondylitis of left elbow on their problem list. Surgical History:  She  has a past surgical history that includes Breast cyst excision (1980); Wrist surgery (Right, 2011); permanent pacemaker insertion (N/A, 01/13/2013); Colonoscopy; Polypectomy; Plantar fasciectomy; and Extracorporeal shock wave lithotripsy (Right, 12/28/2019). Family History:  Her family history includes Anemia in her sister; Cervical cancer in her mother; Colon cancer (age of onset: 531 in her sister; Diabetes in her mother; HIV/AIDS in her brother; Heart disease in her father; Hyperlipidemia in her father; Hypertension in her mother and son; Kidney disease in her son; Multiple myeloma in her maternal aunt; Multiple myeloma (age of onset: 393 in her son; Prostate cancer (age of onset: 680 in her brother. Social History:   reports that she has never smoked. She has never used smokeless tobacco. She reports that she does  not drink alcohol and does not use drugs.  Review of Systems: Review of Systems  Constitutional: Negative for chills, fever, malaise/fatigue and weight loss.  HENT: Negative for ear discharge, ear pain, hearing loss, nosebleeds and tinnitus.   Eyes: Negative for blurred vision, double vision, photophobia, pain and discharge.  Respiratory: Negative for cough, hemoptysis, sputum production and shortness of breath.    Cardiovascular: Negative for chest pain, palpitations, orthopnea and claudication.  Gastrointestinal: Negative for abdominal pain, constipation, diarrhea, heartburn, nausea and vomiting.  Genitourinary: Negative for dysuria, flank pain, frequency, hematuria and urgency.  Musculoskeletal: Positive for joint pain. Negative for back pain, falls, myalgias and neck pain.       Hands, intermittent  Skin: Negative for itching and rash.  Neurological: Negative for dizziness, tingling, tremors, sensory change, speech change, focal weakness, weakness and headaches.  Endo/Heme/Allergies: Negative for environmental allergies and polydipsia. Does not bruise/bleed easily.  Psychiatric/Behavioral: Negative for depression, hallucinations, substance abuse and suicidal ideas. The patient is not nervous/anxious.     Physical Exam: Estimated body mass index is 30.81 kg/m as calculated from the following:   Height as of 03/28/20: 5' 5.5" (1.664 m).   Weight as of 07/25/20: 188 lb (85.3 kg). There were no vitals taken for this visit. General Appearance: Well nourished, in no apparent distress.  Eyes: PERRLA, EOMs, conjunctiva no swelling or erythema, normal fundi and vessels.  Sinuses: No Frontal/maxillary tenderness  ENT/Mouth: Ext aud canals clear, normal light reflex with TMs without erythema, bulging. Good dentition. No erythema, swelling, or exudate on post pharynx. Tonsils not swollen or erythematous. Hearing normal.  Neck: Supple, thyroid normal. No bruits  Respiratory: Respiratory effort normal, BS equal bilaterally without rales, rhonchi, wheezing or stridor.  Cardio: RRR without murmurs, rubs or gallops. Brisk peripheral pulses without edema.  Chest: symmetric, with normal excursions and percussion.  Breasts: Symmetric, without lumps, nipple discharge, retractions.  Abdomen: Soft, nontender, no guarding, rebound, hernias, masses, or organomegaly.  Lymphatics: Non tender without lymphadenopathy.   Musculoskeletal: Full ROM all peripheral extremities,5/5 strength, and normal gait, neg straight leg. Full ROM left hip. + pin point left SI tenderness to palpation.  Skin: Warm, dry without rashes, lesions, ecchymosis. Neuro: Cranial nerves intact, reflexes equal bilaterally. Normal muscle tone, no cerebellar symptoms. Sensation intact.  Psych: Awake and oriented X 3, normal affect, Insight and Judgment appropriate.     Garnet Sierras, Laqueta Jean, DNP [facility] 09/21/2020  9:33 AM

## 2020-09-22 LAB — CBC WITH DIFFERENTIAL/PLATELET
Absolute Monocytes: 479 cells/uL (ref 200–950)
Basophils Absolute: 28 cells/uL (ref 0–200)
Basophils Relative: 0.6 %
Eosinophils Absolute: 108 cells/uL (ref 15–500)
Eosinophils Relative: 2.3 %
HCT: 41.3 % (ref 35.0–45.0)
Hemoglobin: 13.9 g/dL (ref 11.7–15.5)
Lymphs Abs: 2350 cells/uL (ref 850–3900)
MCH: 30.8 pg (ref 27.0–33.0)
MCHC: 33.7 g/dL (ref 32.0–36.0)
MCV: 91.4 fL (ref 80.0–100.0)
MPV: 10.2 fL (ref 7.5–12.5)
Monocytes Relative: 10.2 %
Neutro Abs: 1734 cells/uL (ref 1500–7800)
Neutrophils Relative %: 36.9 %
Platelets: 235 10*3/uL (ref 140–400)
RBC: 4.52 10*6/uL (ref 3.80–5.10)
RDW: 12.8 % (ref 11.0–15.0)
Total Lymphocyte: 50 %
WBC: 4.7 10*3/uL (ref 3.8–10.8)

## 2020-09-22 LAB — COMPLETE METABOLIC PANEL WITH GFR
AG Ratio: 1.5 (calc) (ref 1.0–2.5)
ALT: 15 U/L (ref 6–29)
AST: 15 U/L (ref 10–35)
Albumin: 4.1 g/dL (ref 3.6–5.1)
Alkaline phosphatase (APISO): 109 U/L (ref 37–153)
BUN: 11 mg/dL (ref 7–25)
CO2: 31 mmol/L (ref 20–32)
Calcium: 9.3 mg/dL (ref 8.6–10.4)
Chloride: 104 mmol/L (ref 98–110)
Creat: 0.67 mg/dL (ref 0.50–0.99)
GFR, Est African American: 108 mL/min/{1.73_m2} (ref >=60)
GFR, Est Non African American: 94 mL/min/{1.73_m2} (ref >=60)
Globulin: 2.7 g/dL (calc) (ref 1.9–3.7)
Glucose, Bld: 83 mg/dL (ref 65–99)
Potassium: 4.4 mmol/L (ref 3.5–5.3)
Sodium: 141 mmol/L (ref 135–146)
Total Bilirubin: 0.8 mg/dL (ref 0.2–1.2)
Total Protein: 6.8 g/dL (ref 6.1–8.1)

## 2020-10-18 ENCOUNTER — Other Ambulatory Visit: Payer: Self-pay | Admitting: Adult Health Nurse Practitioner

## 2020-10-18 DIAGNOSIS — Z1152 Encounter for screening for COVID-19: Secondary | ICD-10-CM | POA: Diagnosis not present

## 2020-10-18 DIAGNOSIS — M19012 Primary osteoarthritis, left shoulder: Secondary | ICD-10-CM

## 2020-11-06 ENCOUNTER — Other Ambulatory Visit: Payer: Self-pay | Admitting: Internal Medicine

## 2020-11-06 MED ORDER — ATENOLOL 50 MG PO TABS: ORAL_TABLET | ORAL | 3 refills | Status: DC

## 2020-12-18 IMAGING — CT CT HEAD W/O CM
4 series · 16 of 47 positions shown, 18 images · non-contrast
Comparison: August 12, 2015

CLINICAL DATA: Headache and blurred vision.

EXAM:
CT HEAD WITHOUT CONTRAST
TECHNIQUE: Contiguous axial images were obtained from the base of the skull
through the vertex without intravenous contrast.

[Series 2: head without · axial · non-contrast · 0.42mm/px · z∈[-190,-70]mm · 7 of 33 slices shown, 9 images]
[im 5/33  brain]
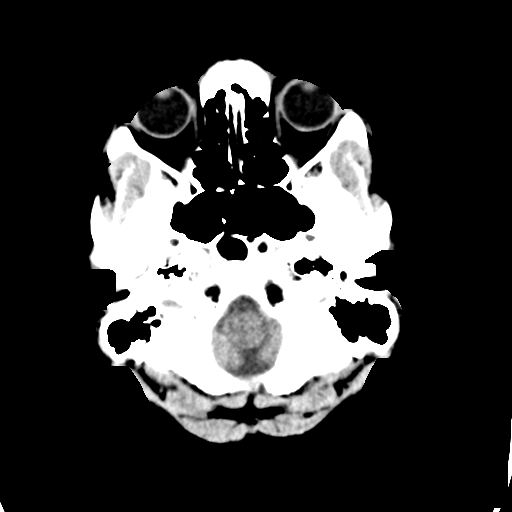
[im 5/33  bone]
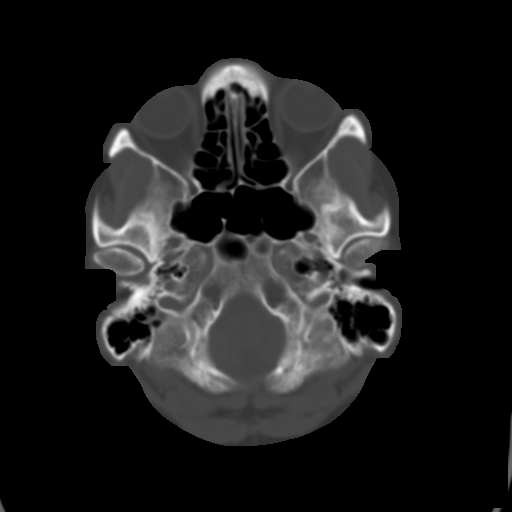
[im 9/33  brain]
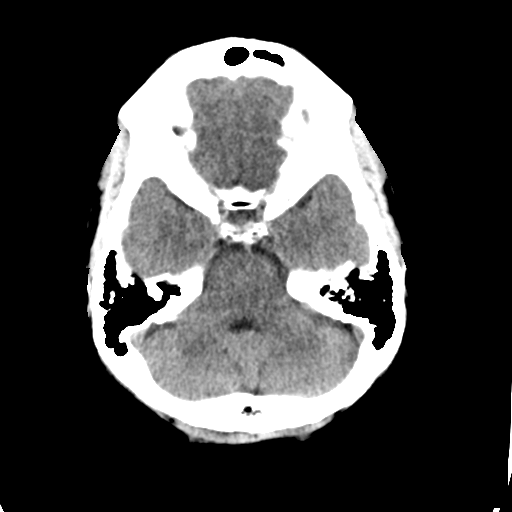
[im 13/33  brain]
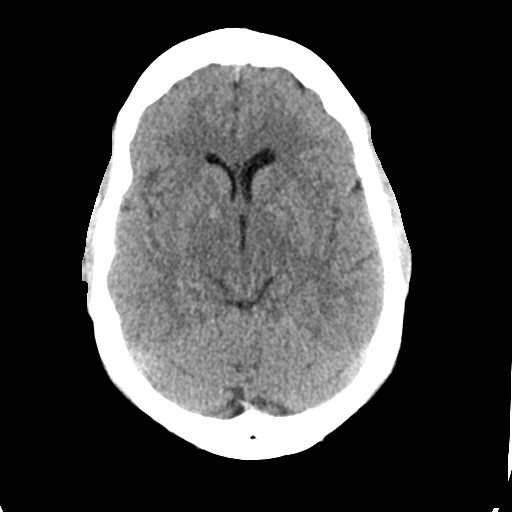
[im 17/33  brain]
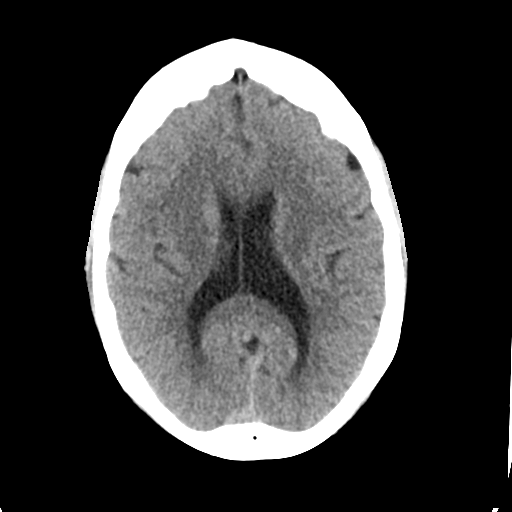
[im 21/33  brain]
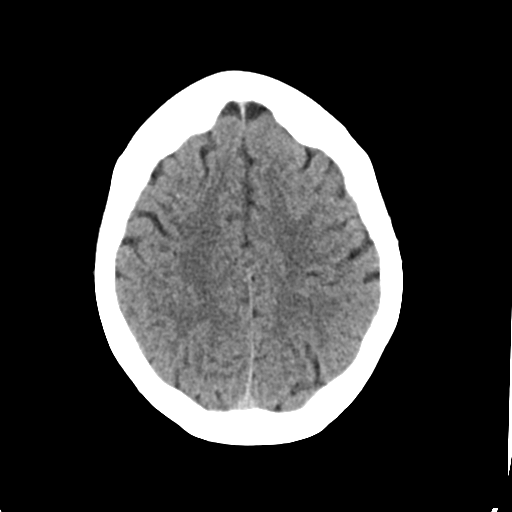
[im 21/33  bone]
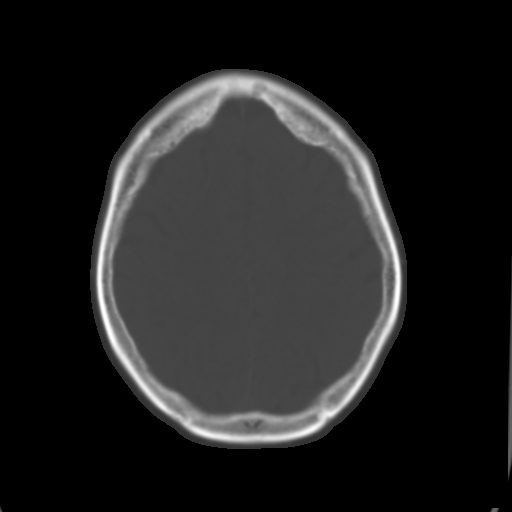
[im 25/33  brain]
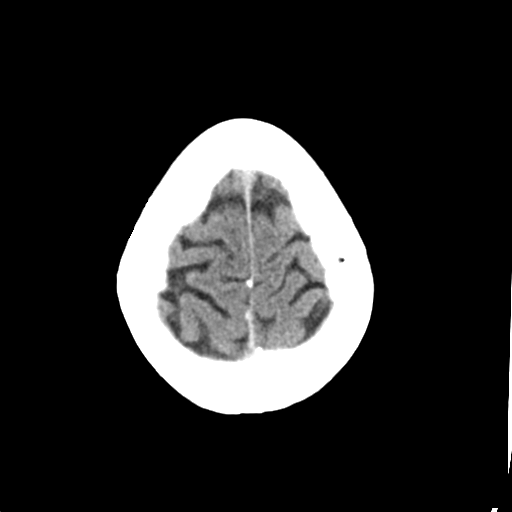
[im 29/33  brain]
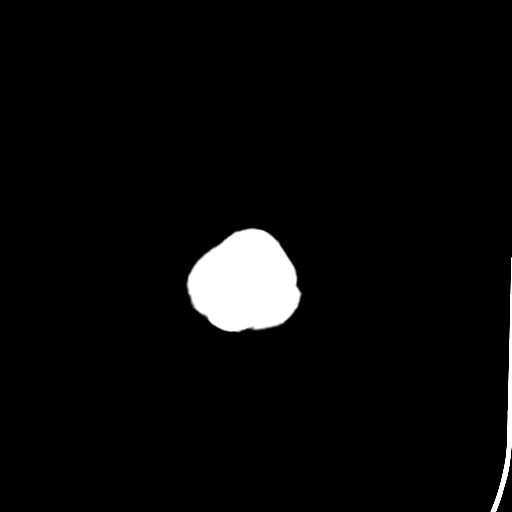

[Series 3: head bone · axial · 0.42mm/px · z∈[-194,-162]mm · 3 of 82 slices shown]
[im 9/82  bone]
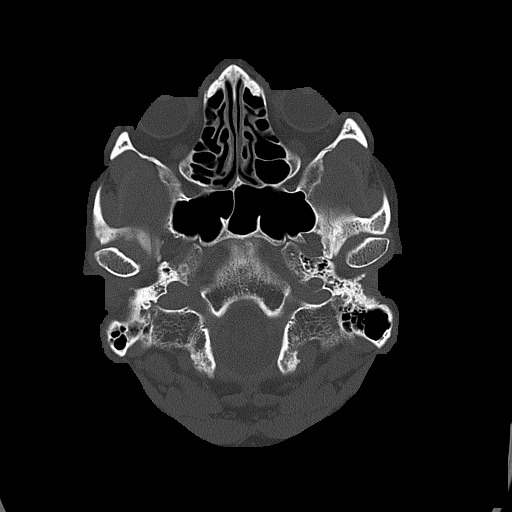
[im 17/82  bone]
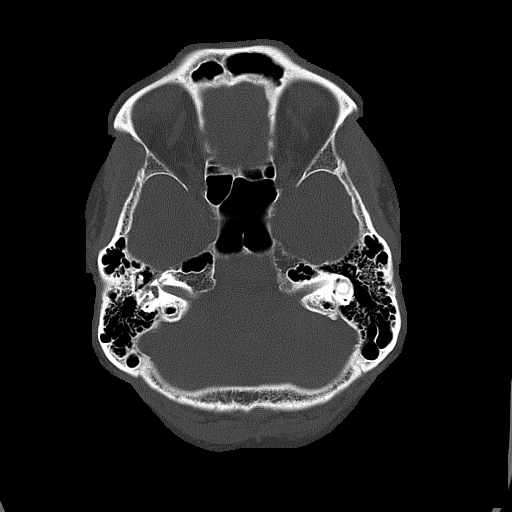
[im 25/82  bone]
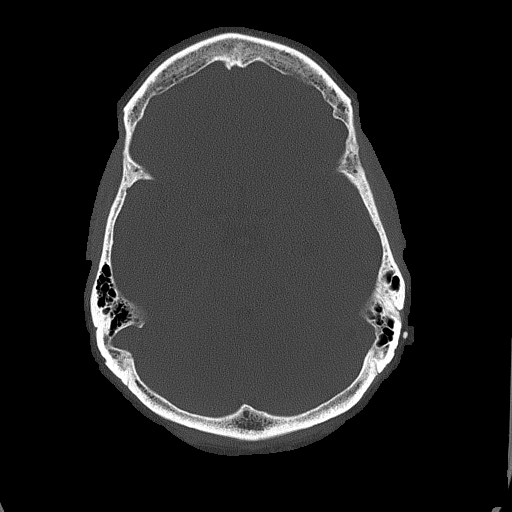

[Series 4: head without cor · coronal · non-contrast · 0.33mm/px · 3 of 69 slices shown]
[im 23/69  brain]
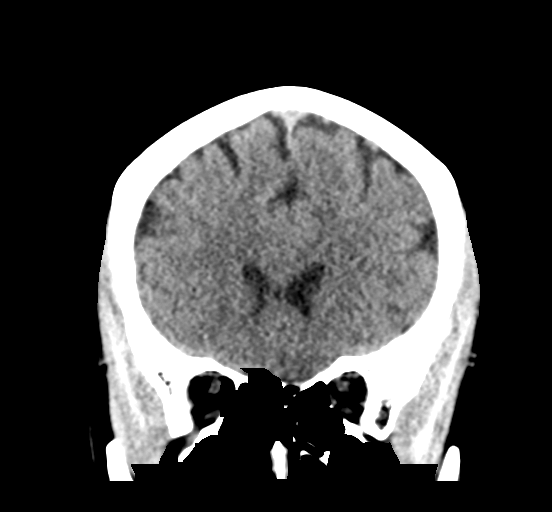
[im 31/69  brain]
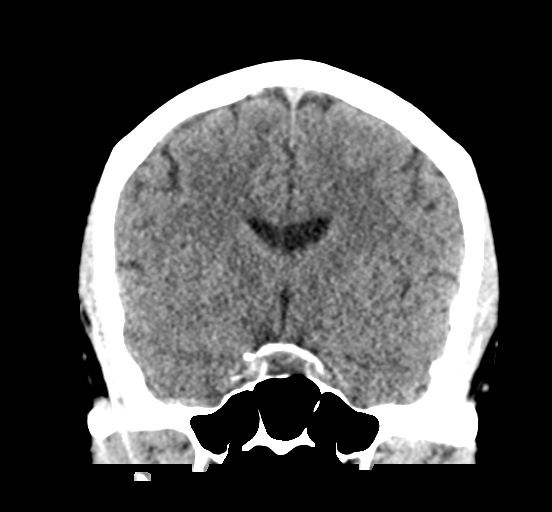
[im 38/69  brain]
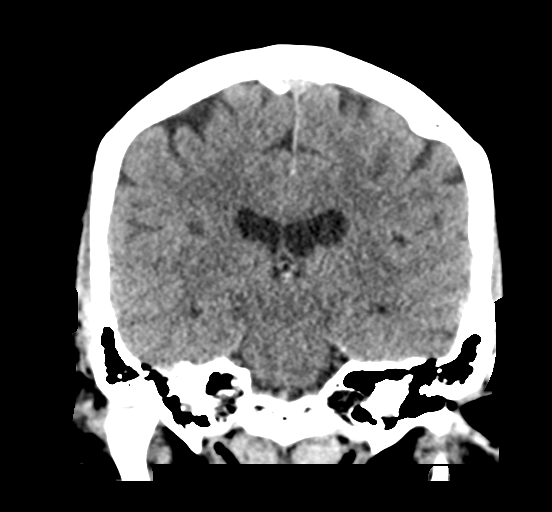

[Series 5: head without sag · sagittal · non-contrast · 0.32mm/px · 3 of 65 slices shown]
[im 22/65  brain]
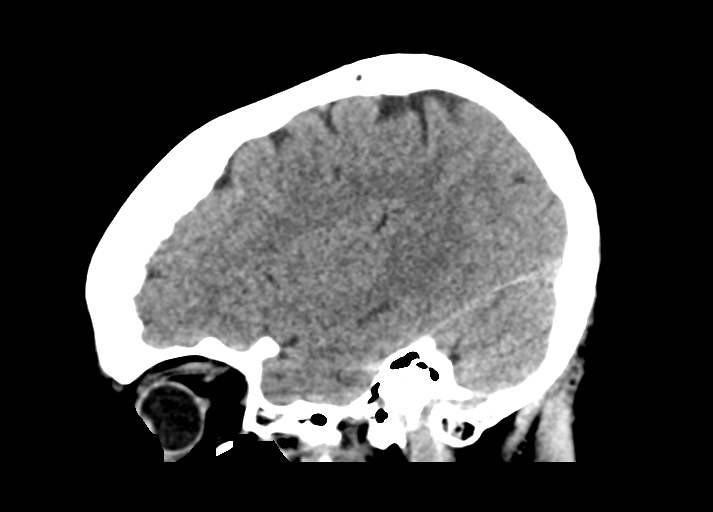
[im 33/65  brain]
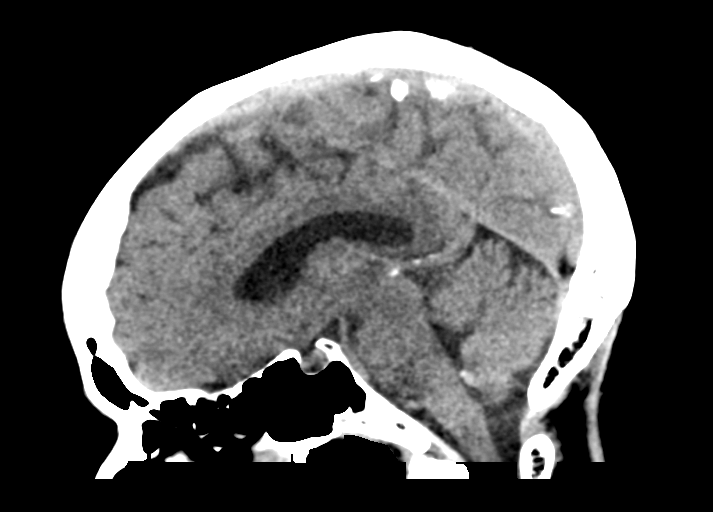
[im 43/65  brain]
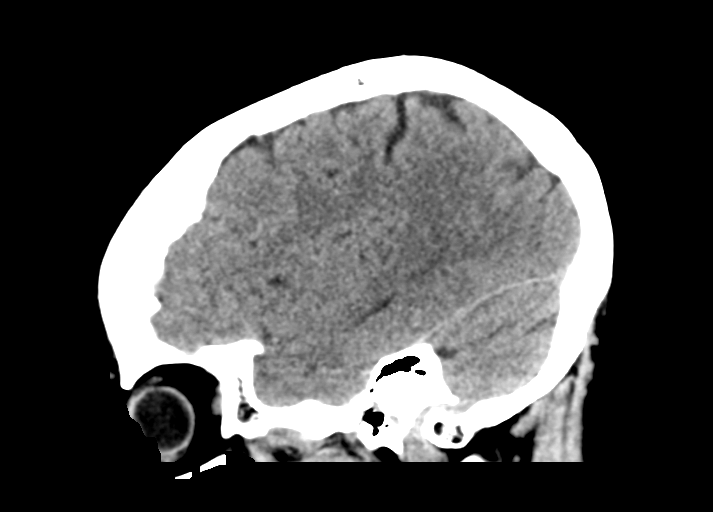

[16 of 47 positions shown; findings below may reference images not displayed]

FINDINGS: Brain: No evidence of acute infarction, hemorrhage, hydrocephalus,
extra-axial collection or mass lesion/mass effect.

A stable thin curvilinear area of calcification is seen along the
posterior aspect of the interhemispheric fissure.

Vascular: No hyperdense vessel or unexpected calcification.

Skull: Normal. Negative for fracture or focal lesion.

Sinuses/Orbits: No acute finding.

Other: None.
IMPRESSION: No acute intracranial pathology.

## 2020-12-21 ENCOUNTER — Encounter: Payer: BC Managed Care – PPO | Admitting: Student

## 2020-12-25 NOTE — Progress Notes (Signed)
Cardiology Office Note Date:  12/28/2020  Patient ID:  Alyssa Dudley, Alyssa Dudley 03-23-1956, MRN [id number] PCP:  Unk Pinto, MD  Electrophysiologist: Dr. [name]   Chief Complaint:  over due   History of Present Illness: Alyssa Dudley is a 65 y.o. female with history of HLD, AFib, Mobitz II AV block > PPM, GERD, HTN  She come today to be seen for Dr. [name], last seen by him Aug 2020 via tele health visit. 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 She was overdue for remotes, discussed getting her back on track with that.  Afib episodes were very short and infrequent, with CHA2DS2Vasc score of one, not on a/c.  TODAY We dicussed her ER visit march last year, BP was >200/>100 associated with posterior headache and some floaters, felt to have been the HTN, not stroke. Since increase in her atenolol, BP has been controlled without recurrent symptomsl. No CP In the last [place] has felt like she has had more flutters, there are brief, not daily or weekly even. And feels like she gets a bit winded with activities that she did not used to. No dizzy spells, near syncope or syncope.  Her PMD monitors her labs/lipids Her transmitter does not seem to be working  I suspect her charted Dec weight of 167 was entered wrong    Device information SJM dual chamber PPM implanted 01/13/2013   Past Medical History:  Diagnosis Date  . Allergy    once a year   . Hyperlipidemia    borderline- no meds   . Pacemaker   . Paroxysmal atrial fibrillation ([place] 6/15   detected on ppm interrogation  . PONV (postoperative nausea and vomiting)    has vomitted in past  . Second degree Mobitz II AV block   . Vitamin D deficiency     Past Surgical History:  Procedure Laterality Date  . BREAST CYST EXCISION  1980  . COLONOSCOPY    . EXTRACORPOREAL SHOCK WAVE LITHOTRIPSY Right 12/28/2019   Procedure: RIGHT EXTRACORPOREAL SHOCK WAVE LITHOTRIPSY (ESWL);   Surgeon: BRaynelle Bring MD;  [facility]  Service: Urology;  Laterality: Right;  . PERMANENT PACEMAKER INSERTION N/A 01/13/2013   SJM Accent DR RF implanted by Dr ARayann Hemanfor mobitz II AV block  . PLANTAR FASCIECTOMY    . POLYPECTOMY    . WRIST SURGERY Right 2011   removed cyst    Current Outpatient Medications  Medication Sig Dispense Refill  . atenolol (TENORMIN) 50 MG tablet Take  1 tablet  Daily  for BP 90 tablet 3  . triamcinolone cream (KENALOG) 0.1 % APPLY EXTERNALLY TO THE AFFECTED AREA THREE TIMES DAILY AS NEEDED FOR RASH 30 g 3  . Vitamin D, Ergocalciferol, (DRISDOL) 1.25 MG (50000 UNIT) CAPS capsule TAKE ONE CAPSULE BY MOUTH 2 TIMES A WEEK 180 capsule 1   No current facility-administered medications for this visit.    Allergies:   Patient has no known allergies.   Social History:  The patient  reports that she has never smoked. She has never used smokeless tobacco. She reports that she does not drink alcohol and does not use drugs.   Family History:  The patient's family history includes Anemia in her sister; Cervical cancer in her mother; Colon cancer (age of onset: 562 in her sister; Diabetes in her mother; HIV/AIDS in her brother; Heart disease in her father; Hyperlipidemia in her father; Hypertension in her mother and  son; Kidney disease in her son; Multiple myeloma in her maternal aunt; Multiple myeloma (age of onset: 82) in her son; Prostate cancer (age of onset: 69) in her brother.  ROS:  Please see the history of present illness.    All other systems are reviewed and otherwise negative.   PHYSICAL EXAM:  VS:  BP (!) 142/88   Pulse 62   Ht '5\' 6"'  (1.676 m)   Wt 191 lb (86.6 kg)   SpO2 99%   BMI 30.83 kg/m  BMI: Body mass index is 30.83 kg/m. Well nourished, well developed, in no acute distress HEENT: normocephalic, atraumatic Neck: no JVD, carotid bruits or masses Cardiac:  RRR; no significant murmurs, no rubs, or gallops Lungs:  CTA  b/l, no wheezing, rhonchi or rales Abd: soft, nontender MS: no deformity or atrophy Ext: no edema Skin: warm and dry, no rash Neuro:  No gross deficits appreciated Psych: euthymic mood, full affect  PPM site is stable, no tethering or discomfort   EKG:  Done today and reviewed by myself shows  SR, V paced  Device interrogation done today and reviewed by myself:  Battery and lead measurements are good Magnet response was day of her systoscopy AMS available EGMs are reviewed Majority are 1:1 with PACs Brief true AFib episodes also noted (seconds) <1% cyber security update completed   01/07/2013: TTE Study Conclusions  - Left ventricle: The cavity size was normal. Systolic  function was normal. The estimated ejection fraction was  in the range of 60% to 65%. Wall motion was normal; there  were no regional wall motion abnormalities.  - Atrial septum: No defect or patent foramen ovale was  identified.  - Pulmonary arteries: PA peak pressure: 16m Hg (S).   Recent Labs: 03/28/2020: Magnesium 2.0; TSH 2.35 09/21/2020: ALT 15; BUN 11; Creat 0.67; Hemoglobin 13.9; Platelets 235; Potassium 4.4; Sodium 141  03/28/2020: Cholesterol 163; HDL 59; LDL Cholesterol (Calc) 87; Total CHOL/HDL Ratio 2.8; Triglycerides 76   CrCl cannot be calculated (Patient's most recent lab result is older than the maximum 21 days allowed.).   Wt Readings from Last 3 Encounters:  12/28/20 191 lb (86.6 kg)  09/21/20 167 lb (75.8 kg)  07/25/20 188 lb (85.3 kg)     Other studies reviewed: Additional studies/records reviewed today include: summarized above  ASSESSMENT AND PLAN:  1. PPM     As above, no programming changes made     I have asked our device clinic to order her a new transmitter  2. Paroxysmal Afib     CHA2DS2Vasc is 2 (including gender)     <1 % [place]den, not all were true AF     We discussed this today, no a/c yet  3. HLD     Monitored and managed by her PMD  4. Some new  DOE     Will update her echo  Disposition: F/u with echo fndigs, remotes as usual and in clinic in a year, sooner if needed  Current medicines are reviewed at length with the patient today.  The patient did not have any concerns regarding medicines.  SVenetia Night PA-C 12/28/2020 5:47 PM     [place] [phone])  [phone])

## 2020-12-28 ENCOUNTER — Ambulatory Visit: Payer: BC Managed Care – PPO | Admitting: Physician Assistant

## 2020-12-28 ENCOUNTER — Encounter: Payer: Self-pay | Admitting: Physician Assistant

## 2020-12-28 ENCOUNTER — Other Ambulatory Visit: Payer: Self-pay

## 2020-12-28 VITALS — BP 142/88 | HR 62 | Ht 66.0 in | Wt 191.0 lb

## 2020-12-28 DIAGNOSIS — R0600 Dyspnea, unspecified: Secondary | ICD-10-CM

## 2020-12-28 DIAGNOSIS — I480 Paroxysmal atrial fibrillation: Secondary | ICD-10-CM | POA: Diagnosis not present

## 2020-12-28 DIAGNOSIS — I441 Atrioventricular block, second degree: Secondary | ICD-10-CM

## 2020-12-28 DIAGNOSIS — I10 Essential (primary) hypertension: Secondary | ICD-10-CM

## 2020-12-28 DIAGNOSIS — Z950 Presence of cardiac pacemaker: Secondary | ICD-10-CM

## 2020-12-28 LAB — CUP PACEART INCLINIC DEVICE CHECK
Date Time Interrogation Session: 20220323180546
Implantable Lead Implant Date: 20140408
Implantable Lead Implant Date: 20140408
Implantable Lead Location: 753859
Implantable Lead Location: 753860
Implantable Lead Model: 1944
Implantable Lead Model: 1948
Implantable Pulse Generator Implant Date: 20140408
Lead Channel Pacing Threshold Amplitude: 0.375 V
Lead Channel Pacing Threshold Amplitude: 0.625 V
Lead Channel Pacing Threshold Pulse Width: 0.4 ms
Lead Channel Pacing Threshold Pulse Width: 0.4 ms
Lead Channel Sensing Intrinsic Amplitude: 3.6 mV
Pulse Gen Model: 2210
Pulse Gen Serial Number: 7457437

## 2020-12-28 NOTE — Patient Instructions (Signed)
Medication Instructions:   Your physician recommends that you continue on your current medications as directed. Please refer to the Current Medication list given to you today.  *If you need a refill on your cardiac medications before your next appointment, please call your pharmacy*   Lab Work: [place]   If you have labs (blood work) drawn today and your tests are completely normal, you will receive your results only by: Marland Kitchen MyChart Message (if you have MyChart) OR . A paper copy in the mail If you have any lab test that is abnormal or we need to change your treatment, we will call you to review the results.   Testing/Procedures: Your physician has requested that you have an echocardiogram. Echocardiography is a painless test that uses sound waves to create images of your heart. It provides your doctor with information about the size and shape of your heart and how well your heart's chambers and valves are working. This procedure takes approximately one hour. There are no restrictions for this procedure.    Follow-Up: At [facility], you and your health needs are our priority.  As part of our continuing mission to provide you with exceptional heart care, we have created designated Provider Care Teams.  These Care Teams include your primary Cardiologist (physician) and Advanced Practice Providers (APPs -  Physician Assistants and Nurse Practitioners) who all work together to provide you with the care you need, when you need it.  We recommend signing up for the patient portal called "MyChart".  Sign up information is provided on this After Visit Summary.  MyChart is used to connect with patients for Virtual Visits (Telemedicine).  Patients are able to view lab/test results, encounter notes, upcoming appointments, etc.  Non-urgent messages can be sent to your provider as well.   To learn more about what you can do with MyChart, go to [email or web address].    Your next  appointment:   1 year(s)  The format for your next appointment:   In Person  Provider:   You may see Dr.Allred or one of the following Advanced Practice Providers on your designated Care Team:    Chanetta Marshall, NP  Tommye Standard, PA-C  Legrand Como "Oda [place], [place]    Other Instructions

## 2020-12-29 ENCOUNTER — Telehealth: Payer: Self-pay

## 2020-12-29 NOTE — Telephone Encounter (Signed)
Monitor ordered 12-29-2020.

## 2021-01-25 ENCOUNTER — Ambulatory Visit (HOSPITAL_COMMUNITY): Payer: BC Managed Care – PPO | Attending: Cardiovascular Disease

## 2021-01-25 ENCOUNTER — Other Ambulatory Visit: Payer: Self-pay

## 2021-01-25 DIAGNOSIS — R0600 Dyspnea, unspecified: Secondary | ICD-10-CM | POA: Insufficient documentation

## 2021-01-25 LAB — ECHOCARDIOGRAM COMPLETE
Area-P 1/2: 2.52 cm2
S' Lateral: 3.1 cm

## 2021-02-09 ENCOUNTER — Ambulatory Visit (INDEPENDENT_AMBULATORY_CARE_PROVIDER_SITE_OTHER): Payer: BC Managed Care – PPO

## 2021-02-09 DIAGNOSIS — I441 Atrioventricular block, second degree: Secondary | ICD-10-CM

## 2021-02-13 ENCOUNTER — Ambulatory Visit (INDEPENDENT_AMBULATORY_CARE_PROVIDER_SITE_OTHER): Payer: Self-pay

## 2021-02-13 DIAGNOSIS — I441 Atrioventricular block, second degree: Secondary | ICD-10-CM

## 2021-02-13 LAB — CUP PACEART REMOTE DEVICE CHECK
Battery Remaining Longevity: 87 mo
Battery Remaining Percentage: 65 %
Battery Voltage: 2.9 V
Brady Statistic AP VP Percent: 79 %
Brady Statistic AP VS Percent: 1 %
Brady Statistic AS VP Percent: 21 %
Brady Statistic AS VS Percent: 1 %
Brady Statistic RA Percent Paced: 78 %
Brady Statistic RV Percent Paced: 99 %
Date Time Interrogation Session: 20220509085828
Implantable Lead Implant Date: 20140408
Implantable Lead Implant Date: 20140408
Implantable Lead Location: 753859
Implantable Lead Location: 753860
Implantable Lead Model: 1944
Implantable Lead Model: 1948
Implantable Pulse Generator Implant Date: 20140408
Lead Channel Impedance Value: 540 Ohm
Lead Channel Impedance Value: 660 Ohm
Lead Channel Pacing Threshold Amplitude: 0.375 V
Lead Channel Pacing Threshold Amplitude: 0.5 V
Lead Channel Pacing Threshold Pulse Width: 0.4 ms
Lead Channel Pacing Threshold Pulse Width: 0.4 ms
Lead Channel Sensing Intrinsic Amplitude: 12 mV
Lead Channel Sensing Intrinsic Amplitude: 3.7 mV
Lead Channel Setting Pacing Amplitude: 0.75 V
Lead Channel Setting Pacing Amplitude: 1.375
Lead Channel Setting Pacing Pulse Width: 0.4 ms
Lead Channel Setting Sensing Sensitivity: 2 mV
Pulse Gen Model: 2210
Pulse Gen Serial Number: 7457437

## 2021-03-01 NOTE — Progress Notes (Signed)
Remote pacemaker transmission.   

## 2021-03-03 NOTE — Progress Notes (Signed)
Remote pacemaker transmission.   

## 2021-03-08 NOTE — Progress Notes (Signed)
Assessment and Plan:  Tarra was seen today for leg pain.  Diagnoses and all orders for this visit:  Left leg pain No injury, check Xray for hip/pelvis, exam suggestive of IT band issue Stretches and exercises given, prefers to avoid meds If not improving in 2-4 weeks can refer to PT/sports med -     DG HIP UNILAT W OR W/O PELVIS 2-3 VIEWS LEFT; Future  Further disposition pending results of labs. Discussed med's effects and SE's.   Over 15 minutes of exam, counseling, chart review, and critical decision making was performed.   Future Appointments  Date Time Provider [place]  03/30/2021  9:00 AM Garnet Sierras, NP [facility] None  05/15/2021  7:05 AM [facility] DEVICE REMOTES [facility]  08/14/2021  7:05 AM [facility] DEVICE REMOTES [facility]  11/13/2021  7:05 AM [facility] DEVICE REMOTES [facility]    ------------------------------------------------------------------------------------------------------------------   HPI BP (!) 150/84   Pulse 63   Temp (!) 96.4 F (35.8 C)   Wt 191 lb (86.6 kg)   SpO2 96%   BMI 30.83 kg/m   65 y.o.female presents for evaluation of left leg pain.   She reports sx began 2-3 months ago, bothers her when she is standing/walking, but may also bother her if she doesn't sleep in the right position. She describes as pain in lateral hip/high/upper lateral calf, burning sensation. Denies joint pain, lumbar back pain, radicular sx to foot/toes, numbness/weakness. Denies injury prior to onset.   She has taken ibuprofen 400 mg in the evening, hasn't really helped. Prefers to avoid medications. Was improving without intervention but now seems to be getting worse. Can get up to 9/10, but changing position/sitting will help. Resolves at rest.     Past Medical History:  Diagnosis Date  . Allergy    once a year   . Hyperlipidemia    borderline- no meds   . Pacemaker   . Paroxysmal atrial  fibrillation ([place]) 6/15   detected on ppm interrogation  . PONV (postoperative nausea and vomiting)    has vomitted in past  . Second degree Mobitz II AV block   . Vitamin D deficiency      No Known Allergies  Current Outpatient Medications on File Prior to Visit  Medication Sig  . atenolol (TENORMIN) 50 MG tablet Take  1 tablet  Daily  for BP  . triamcinolone cream (KENALOG) 0.1 % APPLY EXTERNALLY TO THE AFFECTED AREA THREE TIMES DAILY AS NEEDED FOR RASH  . Vitamin D, Ergocalciferol, (DRISDOL) 1.25 MG (50000 UNIT) CAPS capsule TAKE ONE CAPSULE BY MOUTH 2 TIMES A WEEK   No current facility-administered medications on file prior to visit.    ROS: all negative except above.   Physical Exam:  BP (!) 150/84   Pulse 63   Temp (!) 96.4 F (35.8 C)   Wt 191 lb (86.6 kg)   SpO2 96%   BMI 30.83 kg/m   General Appearance: Well nourished, well dressed AA female, in no apparent distress. Eyes:  conjunctiva no swelling or erythema ENT/Mouth: Mask in place; Hearing normal.  Neck: Supple Respiratory: Respiratory effort normal Cardio: Appears well perfused. Brisk peripheral pulses without edema.  Abdomen: Soft, + BS.  Non tender, no guarding, rebound, hernias, masses. Lymphatics: Non tender without lymphadenopathy.  Musculoskeletal: Full ROM, 5/5 strength, normal gait. No spinous tenderness, SI tenderness. No popping/clicking/crepitus of bil knees or hips. + tenderness of gluteal muscles and along L IT band.  Skin: Warm, dry without rashes, lesions,  ecchymosis.  Neuro: Cranial nerves intact. Normal muscle tone, no cerebellar symptoms. Sensation intact.  Psych: Awake and oriented X 3, normal affect, Insight and Judgment appropriate.     Izora Ribas, NP 9:22 AM [facility]

## 2021-03-09 ENCOUNTER — Encounter: Payer: Self-pay | Admitting: Adult Health

## 2021-03-09 ENCOUNTER — Ambulatory Visit (INDEPENDENT_AMBULATORY_CARE_PROVIDER_SITE_OTHER): Payer: BC Managed Care – PPO | Admitting: Adult Health

## 2021-03-09 ENCOUNTER — Other Ambulatory Visit: Payer: Self-pay

## 2021-03-09 VITALS — BP 150/84 | HR 63 | Temp 96.4°F | Wt 191.0 lb

## 2021-03-09 DIAGNOSIS — M79605 Pain in left leg: Secondary | ICD-10-CM | POA: Diagnosis not present

## 2021-03-09 NOTE — Patient Instructions (Signed)
Check out IT band stretches  Consider foam rolling      Iliotibial Band Syndrome Rehab Ask your health care provider which exercises are safe for you. Do exercises exactly as told by your health care provider and adjust them as directed. It is normal to feel mild stretching, pulling, tightness, or discomfort as you do these exercises. Stop right away if you feel sudden pain or your pain gets significantly worse. Do not begin these exercises until told by your health care provider. Stretching and range-of-motion exercises These exercises warm up your muscles and joints and improve the movement and flexibility of your hip and pelvis. Quadriceps stretch, prone 1. Lie on your abdomen (prone position) on a firm surface, such as a bed or padded floor. 2. Bend your left / right knee and reach back to hold your ankle or pant leg. If you cannot reach your ankle or pant leg, loop a belt around your foot and grab the belt instead. 3. Gently pull your heel toward your buttocks. Your knee should not slide out to the side. You should feel a stretch in the front of your thigh and knee (quadriceps). 4. Hold this position for __________ seconds. Repeat __________ times. Complete this exercise __________ times a day.   Iliotibial band stretch An iliotibial band is a strong band of muscle tissue that runs from the outer side of your hip to the outer side of your thigh and knee. 1. Lie on your side with your left / right leg in the top position. 2. Bend both of your knees and grab your left / right ankle. Stretch out your bottom arm to help you balance. 3. Slowly bring your top knee back so your thigh goes behind your trunk. 4. Slowly lower your top leg toward the floor until you feel a gentle stretch on the outside of your left / right hip and thigh. If you do not feel a stretch and your knee will not fall farther, place the heel of your other foot on top of your knee and pull your knee down toward the floor  with your foot. 5. Hold this position for __________ seconds. Repeat __________ times. Complete this exercise __________ times a day.   Strengthening exercises These exercises build strength and endurance in your hip and pelvis. Endurance is the ability to use your muscles for a long time, even after they get tired. Straight leg raises, side-lying This exercise strengthens the muscles that rotate the leg at the hip and move it away from your body (hip abductors). 1. Lie on your side with your left / right leg in the top position. Lie so your head, shoulder, hip, and knee line up. You may bend your bottom knee to help you balance. 2. Roll your hips slightly forward so your hips are stacked directly over each other and your left / right knee is facing forward. 3. Tense the muscles in your outer thigh and lift your top leg 4-6 inches (10-15 cm). 4. Hold this position for __________ seconds. 5. Slowly lower your leg to return to the starting position. Let your muscles relax completely before doing another repetition. Repeat __________ times. Complete this exercise __________ times a day.   Leg raises, prone This exercise strengthens the muscles that move the hips backward (hip extensors). 1. Lie on your abdomen (prone position) on your bed or a firm surface. You can put a pillow under your hips if that is more comfortable for your lower back. 2. [place]  your left / right knee so your foot is straight up in the air. 3. Squeeze your buttocks muscles and lift your left / right thigh off the bed. Do not let your back arch. 4. Tense your thigh muscle as hard as you can without increasing any knee pain. 5. Hold this position for __________ seconds. 6. Slowly lower your leg to return to the starting position and allow it to relax completely. Repeat __________ times. Complete this exercise __________ times a day. Hip hike 1. Stand sideways on a bottom step. Stand on your left / right leg with your other foot  unsupported next to the step. You can hold on to a railing or wall for balance if needed. 2. Keep your knees straight and your torso square. Then lift your left / right hip up toward the ceiling. 3. Slowly let your left / right hip lower toward the floor, past the starting position. Your foot should get closer to the floor. Do not lean or bend your knees. Repeat __________ times. Complete this exercise __________ times a day. This information is not intended to replace advice given to you by your health care provider. Make sure you discuss any questions you have with your health care provider. Document Revised: 12/02/2019 Document Reviewed: 12/02/2019 Elsevier Patient Education  [place].

## 2021-03-10 NOTE — Addendum Note (Signed)
Addended by: Douglass Rivers D on: 03/10/2021 05:19 PM   Modules accepted: Level of Service

## 2021-03-11 ENCOUNTER — Other Ambulatory Visit: Payer: Self-pay | Admitting: Adult Health

## 2021-03-11 DIAGNOSIS — E559 Vitamin D deficiency, unspecified: Secondary | ICD-10-CM

## 2021-03-14 ENCOUNTER — Ambulatory Visit
Admission: RE | Admit: 2021-03-14 | Discharge: 2021-03-14 | Disposition: A | Discharge status: Home / Self Care | Payer: BC Managed Care – PPO | Source: Ambulatory Visit | Attending: Adult Health | Admitting: Adult Health

## 2021-03-14 DIAGNOSIS — M79605 Pain in left leg: Secondary | ICD-10-CM

## 2021-03-14 DIAGNOSIS — M25552 Pain in left hip: Secondary | ICD-10-CM | POA: Diagnosis not present

## 2021-03-30 ENCOUNTER — Encounter: Payer: BC Managed Care – PPO | Admitting: Adult Health Nurse Practitioner

## 2021-03-30 ENCOUNTER — Encounter: Payer: BC Managed Care – PPO | Admitting: Nurse Practitioner

## 2021-04-03 NOTE — Progress Notes (Signed)
Complete Physical  Assessment and Plan:  Encounter for routine adult health examination with abnormal findings 1 year  Mobitz type 2 second degree and high-grade heart block Pacemaker in place, continue follow up with Dr. Rayann Dudley Last echo 01/2021 and was looking ok, followed by cardiology  Essential hypertension Continue medication Monitor blood pressure at home; call if consistently over 130/80 Continue DASH diet.   Reminder to go to the ER if any CP, SOB, nausea, dizziness, severe HA, changes vision/speech, left arm numbness and tingling and jaw pain. -UA, microalbumin/creatinine urine ratio   Paroxysmal atrial fibrillation (HCC) CHADSASC is now 2 since she is being treated for HTN- not on anticoag Followed by cardiology- last seen 12/2020 decided no A/C needed as of yet  Gastroesophageal reflux disease, esophagitis presence not specified Well managed on current medications; PRN OTC agents Discussed diet, avoiding triggers and other lifestyle changes  Benign neoplasm of colon, unspecified part of colon UTD  Vitamin D deficiency Continue supplementation for goal of 70-100 Check vitamin D level  Overweight (BMI 25.0-29.9) Long discussion about weight loss, diet, and exercise Recommended diet heavy in fruits and veggies and low in animal meats, cheeses, and dairy products, appropriate calorie intake Patient will work on cutting down on sugary snacks - pt down 1 pound  Medication management CBC, CMP/GFR, UA, magnesium  Pure hypercholesterolemia Currently at goal by lifestyle modification Continue low cholesterol diet and exercise.  Check lipid panel.   History of peptic ulcer disease Avoid NSAIDS  Abnormal glucose Discussed diet/exercise, weight management  A1C  Osteoarthritis -Does exercises and Tylenol as needed  Salivary stone pt had sore throat for approx week but had salivary stone she removed and has resolved, advised to continue to monitor and if should  return to come to office for evaluation  Discupt had sore throat for approx week but had salivary stone she removed and has resolvedssed med's effects and SE's. Screening labs and tests as requested with regular follow-up as recommended. Over 60 minutes of exam, counseling, chart review and critical decision making was performed  Future Appointments  Date Time Provider [place]  05/15/2021  7:05 AM [facility] DEVICE REMOTES [facility]  08/14/2021  7:05 AM [facility] DEVICE REMOTES [facility]  11/13/2021  7:05 AM [facility] DEVICE REMOTES [facility]  04/05/2022 10:00 AM Alyssa Dudley, Alyssa Roger, NP [facility] None     HPI  This very nice 65 y.o.female presents for complete physical. has GERD; Constipation; History of peptic ulcer disease; Family history of malignant neoplasm of gastrointestinal tract; Benign neoplasm of colon; Mobitz type 2 second degree and high-grade heart block; Hyperlipidemia; Vitamin D deficiency; Atrial fibrillation ([name]); HTN (hypertension); Abnormal glucose; Medication management; Overweight (BMI 25.0-29.9); Meralgia paraesthetica, left; Plantar fasciitis of left foot; Lateral epicondylitis of left elbow; Osteoarthritis of multiple joints; and Salivary stone on their problem list.   She has Afib and mobitz type 2 with pacemaker placement in 2014, follows with Dr. Rayann Dudley and Alyssa Dudley, low risk CHADSVASC 1 and not on anticoagulation. Patient reports no complaints at this time. Last EKG 42022 Stable.  She is having left leg pain with negative Xray of left hip, doing exercises She does not take tylenol rarely as needed She has right bunion and right hallux rigidus- she bought a toe separator.   BMI is Body mass index is 31.65 kg/m., she has been working on diet and exercise.  Wt Readings from Last 3 Encounters:  04/05/21 190 lb 3.2 oz (86.3 kg)  03/09/21 191 lb (86.6  kg)  12/28/20 191 lb (86.6 kg)   Her blood pressure has been  controlled at home, today their BP is BP: (!) 150/80, recheck 142/84  She does workout. She denies chest pain, shortness of breath, dizziness.   She is not on cholesterol medication, she tried zetia but states she had diarrhea with that, taking 3 x a week. . Her cholesterol is not at goal. The cholesterol last visit was:   Lab Results  Component Value Date   CHOL 163 03/28/2020   HDL 59 03/28/2020   LDLCALC 87 03/28/2020   TRIG 76 03/28/2020   CHOLHDL 2.8 03/28/2020    She has been working on diet and exercise for glucose management, and denies foot ulcerations, increased appetite, nausea, paresthesia of the feet, polydipsia, polyuria, visual disturbances and vomiting. Last A1C in the office was:  Lab Results  Component Value Date   HGBA1C 5.3 03/28/2020   Patient is on Vitamin D supplement, 1 capsule twice a week Lab Results  Component Value Date   VD25OH 64 03/28/2020       Current Medications:  Current Outpatient Medications on File Prior to Visit  Medication Sig Dispense Refill   atenolol (TENORMIN) 50 MG tablet Take  1 tablet  Daily  for BP 90 tablet 3   triamcinolone cream (KENALOG) 0.1 % APPLY EXTERNALLY TO THE AFFECTED AREA THREE TIMES DAILY AS NEEDED FOR RASH 30 g 3   Vitamin D, Ergocalciferol, (DRISDOL) 1.25 MG (50000 UNIT) CAPS capsule TAKE ONE CAPSULE BY MOUTH 2 TIMES A WEEK 26 capsule 3   No current facility-administered medications on file prior to visit.   Health Maintenance:   Immunization History  Administered Date(s) Administered   Influenza Inj Mdck Quad With Preservative 07/26/2017, 08/11/2018, 07/13/2019, 07/25/2020   Influenza Split 06/30/2013, 07/20/2014, 07/21/2015   Influenza,inj,quad, With Preservative 07/05/2016   PFIZER(Purple Top)SARS-COV-2 Vaccination 12/05/2019, 12/26/2019, 07/23/2020   PPD Test 02/22/2014, 05/10/2015   Tdap 10/09/2011   Zoster Recombinat (Shingrix) 01/09/2017, 02/06/2018, 02/06/2018   Colonoscopy:05/2018 Mammo: 08/26/20  normal BMD: 2015 WNL will wait until closer to 66 Pap/ Pelvic: 2021WNL - HPV neg  EKG 12/28/20 Echo 01/2021  EYE: Dr. Peter Dudley, last 2021, wears glasses Dentist: Dr. Glynn Dudley, Q 72month  Allergies: No Known Allergies Medical History:  has GERD; Constipation; History of peptic ulcer disease; Family history of malignant neoplasm of gastrointestinal tract; Benign neoplasm of colon; Mobitz type 2 second degree and high-grade heart block; Hyperlipidemia; Vitamin D deficiency; Atrial fibrillation ([place]; HTN (hypertension); Abnormal glucose; Medication management; Overweight (BMI 25.0-29.9); Meralgia paraesthetica, left; Plantar fasciitis of left foot; Lateral epicondylitis of left elbow; Osteoarthritis of multiple joints; and Salivary stone on their problem list. Surgical History:  She  has a past surgical history that includes Breast cyst excision (1980); Wrist surgery (Right, 2011); permanent pacemaker insertion (N/A, 01/13/2013); Colonoscopy; Polypectomy; Plantar fasciectomy; and Extracorporeal shock wave lithotripsy (Right, 12/28/2019). Family History:  Her family history includes Anemia in her sister; Cervical cancer in her mother; Colon cancer (age of onset: 5108 in her sister; Diabetes in her mother; HIV/AIDS in her brother; Heart disease in her father; Hyperlipidemia in her father; Hypertension in her mother and son; Kidney disease in her son; Multiple myeloma in her maternal aunt; Multiple myeloma (age of onset: 347 in her son; Prostate cancer (age of onset: 688 in her brother. Social History:   reports that she has never smoked. She has never used smokeless tobacco. She reports that she does not drink alcohol and does not  use drugs.  Review of Systems: Review of Systems  Constitutional:  Negative for chills, fever, malaise/fatigue and weight loss.  HENT:  Negative for congestion, ear discharge, hearing loss, sore throat (pt had sore throat for approx week but had salivary stone she removed and has  resolved) and tinnitus.   Eyes:  Negative for blurred vision, double vision, discharge and redness.  Respiratory:  Negative for cough, sputum production, shortness of breath and wheezing.   Cardiovascular:  Negative for chest pain, palpitations, orthopnea, claudication, leg swelling and PND.  Gastrointestinal:  Positive for diarrhea (occasional intermittent). Negative for abdominal pain, blood in stool, constipation, heartburn, melena, nausea and vomiting.  Genitourinary: Negative.  Negative for dysuria, frequency and urgency.  Musculoskeletal:  Positive for joint pain. Negative for back pain, falls and myalgias (left leg lateral pain, worse with standing, movement, she can shift her weight that can help).  Skin:  Negative for rash.  Neurological:  Negative for dizziness, tingling, sensory change, seizures, weakness and headaches.  Endo/Heme/Allergies:  Negative for polydipsia. Does not bruise/bleed easily.  Psychiatric/Behavioral: Negative.  Negative for depression, hallucinations, memory loss, substance abuse and suicidal ideas. The patient is not nervous/anxious and does not have insomnia.   All other systems reviewed and are negative.  Physical Exam: Estimated body mass index is 31.65 kg/m as calculated from the following:   Height as of this encounter: _0  (1.651 m).   Weight as of this encounter: 190 lb 3.2 oz (86.3 kg). BP (!) 150/80   Pulse 84   Temp 97.8 F (36.6 C)   Resp 16   Ht _1  (1.651 m)   Wt 190 lb 3.2 oz (86.3 kg)   SpO2 99%   BMI 31.65 kg/m  General Appearance: Well nourished, in no apparent distress.  Eyes: PERRLA, EOMs, conjunctiva no swelling or erythema, normal fundi and vessels.  Sinuses: No Frontal/maxillary tenderness  ENT/Mouth: Ext aud canals clear, normal light reflex with TMs without erythema, bulging. Good dentition. No erythema, swelling, or exudate on post pharynx. Tonsils not swollen or erythematous. Hearing normal.  Neck: Supple, thyroid normal.  No bruits  Respiratory: Respiratory effort normal, BS equal bilaterally without rales, rhonchi, wheezing or stridor.  Cardio: RRR without murmurs, rubs or gallops. Brisk peripheral pulses without edema.  Chest: symmetric, with normal excursions and percussion.  Breasts: Symmetric, without lumps, nipple discharge, retractions.  Abdomen: Soft, nontender, no guarding, rebound, hernias, masses, or organomegaly.  Lymphatics: Non tender without lymphadenopathy.  Genitourinary: normal external genitalia, vulva, vagina, cervix, uterus and adnexa, PAP: Pap smear done today. Musculoskeletal: Full ROM all peripheral extremities,5/5 strength, and normal gait, neg straight leg. Full ROM left hip. + pin point left SI tenderness to palpation.  Skin: Warm, dry without rashes, lesions, ecchymosis. Neuro: Cranial nerves intact, reflexes equal bilaterally. Normal muscle tone, no cerebellar symptoms. Sensation intact.  Psych: Awake and oriented X 3, normal affect, Insight and Judgment appropriate.   EKG: defer Cardiology follows  Alyssa Dudley 10:34 AM [facility]

## 2021-04-05 ENCOUNTER — Ambulatory Visit (INDEPENDENT_AMBULATORY_CARE_PROVIDER_SITE_OTHER): Payer: BC Managed Care – PPO | Admitting: Nurse Practitioner

## 2021-04-05 ENCOUNTER — Other Ambulatory Visit: Payer: Self-pay

## 2021-04-05 ENCOUNTER — Encounter: Payer: Self-pay | Admitting: Nurse Practitioner

## 2021-04-05 VITALS — BP 150/80 | HR 84 | Temp 97.8°F | Resp 16 | Ht 65.0 in | Wt 190.2 lb

## 2021-04-05 DIAGNOSIS — E559 Vitamin D deficiency, unspecified: Secondary | ICD-10-CM | POA: Diagnosis not present

## 2021-04-05 DIAGNOSIS — Z131 Encounter for screening for diabetes mellitus: Secondary | ICD-10-CM

## 2021-04-05 DIAGNOSIS — K115 Sialolithiasis: Secondary | ICD-10-CM | POA: Insufficient documentation

## 2021-04-05 DIAGNOSIS — Z1329 Encounter for screening for other suspected endocrine disorder: Secondary | ICD-10-CM | POA: Diagnosis not present

## 2021-04-05 DIAGNOSIS — D126 Benign neoplasm of colon, unspecified: Secondary | ICD-10-CM

## 2021-04-05 DIAGNOSIS — Z13220 Encounter for screening for lipoid disorders: Secondary | ICD-10-CM

## 2021-04-05 DIAGNOSIS — I10 Essential (primary) hypertension: Secondary | ICD-10-CM

## 2021-04-05 DIAGNOSIS — Z0000 Encounter for general adult medical examination without abnormal findings: Secondary | ICD-10-CM

## 2021-04-05 DIAGNOSIS — Z79899 Other long term (current) drug therapy: Secondary | ICD-10-CM | POA: Diagnosis not present

## 2021-04-05 DIAGNOSIS — K219 Gastro-esophageal reflux disease without esophagitis: Secondary | ICD-10-CM

## 2021-04-05 DIAGNOSIS — E663 Overweight: Secondary | ICD-10-CM

## 2021-04-05 DIAGNOSIS — R7309 Other abnormal glucose: Secondary | ICD-10-CM

## 2021-04-05 DIAGNOSIS — M159 Polyosteoarthritis, unspecified: Secondary | ICD-10-CM | POA: Insufficient documentation

## 2021-04-05 DIAGNOSIS — Z0001 Encounter for general adult medical examination with abnormal findings: Secondary | ICD-10-CM

## 2021-04-05 DIAGNOSIS — I441 Atrioventricular block, second degree: Secondary | ICD-10-CM

## 2021-04-05 DIAGNOSIS — E7800 Pure hypercholesterolemia, unspecified: Secondary | ICD-10-CM

## 2021-04-05 DIAGNOSIS — Z1389 Encounter for screening for other disorder: Secondary | ICD-10-CM

## 2021-04-05 DIAGNOSIS — I480 Paroxysmal atrial fibrillation: Secondary | ICD-10-CM

## 2021-04-05 HISTORY — DX: Sialolithiasis: K11.5

## 2021-04-06 ENCOUNTER — Encounter: Payer: BC Managed Care – PPO | Admitting: Nurse Practitioner

## 2021-04-06 ENCOUNTER — Encounter: Payer: Self-pay | Admitting: Nurse Practitioner

## 2021-04-06 LAB — MICROALBUMIN / CREATININE URINE RATIO
Creatinine, Urine: 105 mg/dL (ref 20–275)
Microalb Creat Ratio: 3 mcg/mg creat (ref <30)
Microalb, Ur: 0.3 mg/dL

## 2021-04-06 LAB — MAGNESIUM: Magnesium: 2 mg/dL (ref 1.5–2.5)

## 2021-04-06 LAB — COMPLETE METABOLIC PANEL WITH GFR
AG Ratio: 1.4 (calc) (ref 1.0–2.5)
ALT: 15 U/L (ref 6–29)
AST: 14 U/L (ref 10–35)
Albumin: 4.2 g/dL (ref 3.6–5.1)
Alkaline phosphatase (APISO): 104 U/L (ref 37–153)
BUN: 10 mg/dL (ref 7–25)
CO2: 32 mmol/L (ref 20–32)
Calcium: 9.4 mg/dL (ref 8.6–10.4)
Chloride: 107 mmol/L (ref 98–110)
Creat: 0.67 mg/dL (ref 0.50–0.99)
GFR, Est African American: 108 mL/min/{1.73_m2} (ref >=60)
GFR, Est Non African American: 94 mL/min/{1.73_m2} (ref >=60)
Globulin: 3 g/dL (calc) (ref 1.9–3.7)
Glucose, Bld: 85 mg/dL (ref 65–99)
Potassium: 4.3 mmol/L (ref 3.5–5.3)
Sodium: 142 mmol/L (ref 135–146)
Total Bilirubin: 0.8 mg/dL (ref 0.2–1.2)
Total Protein: 7.2 g/dL (ref 6.1–8.1)

## 2021-04-06 LAB — HEMOGLOBIN A1C
Hgb A1c MFr Bld: 5.4 % of total Hgb (ref <5.7)
Mean Plasma Glucose: 108 mg/dL
eAG (mmol/L): 6 mmol/L

## 2021-04-06 LAB — URINALYSIS, ROUTINE W REFLEX MICROSCOPIC
Bilirubin Urine: NEGATIVE
Glucose, UA: NEGATIVE
Hgb urine dipstick: NEGATIVE
Ketones, ur: NEGATIVE
Leukocytes,Ua: NEGATIVE
Nitrite: NEGATIVE
Protein, ur: NEGATIVE
Specific Gravity, Urine: 1.016 (ref 1.001–1.035)
pH: 7 (ref 5.0–8.0)

## 2021-04-06 LAB — CBC WITH DIFFERENTIAL/PLATELET
Absolute Monocytes: 450 cells/uL (ref 200–950)
Basophils Absolute: 32 cells/uL (ref 0–200)
Basophils Relative: 0.7 %
Eosinophils Absolute: 149 cells/uL (ref 15–500)
Eosinophils Relative: 3.3 %
HCT: 40.9 % (ref 35.0–45.0)
Hemoglobin: 13.7 g/dL (ref 11.7–15.5)
Lymphs Abs: 2174 cells/uL (ref 850–3900)
MCH: 30.6 pg (ref 27.0–33.0)
MCHC: 33.5 g/dL (ref 32.0–36.0)
MCV: 91.3 fL (ref 80.0–100.0)
MPV: 10.3 fL (ref 7.5–12.5)
Monocytes Relative: 10 %
Neutro Abs: 1697 cells/uL (ref 1500–7800)
Neutrophils Relative %: 37.7 %
Platelets: 232 10*3/uL (ref 140–400)
RBC: 4.48 10*6/uL (ref 3.80–5.10)
RDW: 13.2 % (ref 11.0–15.0)
Total Lymphocyte: 48.3 %
WBC: 4.5 10*3/uL (ref 3.8–10.8)

## 2021-04-06 LAB — LIPID PANEL
Cholesterol: 175 mg/dL (ref <200)
HDL: 50 mg/dL (ref >=50)
LDL Cholesterol (Calc): 111 mg/dL (calc) — ABNORMAL HIGH
Non-HDL Cholesterol (Calc): 125 mg/dL (calc) (ref <130)
Total CHOL/HDL Ratio: 3.5 (calc) (ref <5.0)
Triglycerides: 57 mg/dL (ref <150)

## 2021-04-06 LAB — TSH: TSH: 2.58 mIU/L (ref 0.40–4.50)

## 2021-04-06 LAB — VITAMIN D 25 HYDROXY (VIT D DEFICIENCY, FRACTURES): Vit D, 25-Hydroxy: 115 ng/mL — ABNORMAL HIGH (ref 30–100)

## 2021-05-15 ENCOUNTER — Ambulatory Visit (INDEPENDENT_AMBULATORY_CARE_PROVIDER_SITE_OTHER): Payer: BC Managed Care – PPO

## 2021-05-15 DIAGNOSIS — I441 Atrioventricular block, second degree: Secondary | ICD-10-CM

## 2021-05-16 LAB — CUP PACEART REMOTE DEVICE CHECK
Battery Remaining Longevity: 25 mo
Battery Remaining Percentage: 19 %
Battery Voltage: 2.89 V
Brady Statistic AP VP Percent: 75 %
Brady Statistic AP VS Percent: 1 %
Brady Statistic AS VP Percent: 24 %
Brady Statistic AS VS Percent: 1 %
Brady Statistic RA Percent Paced: 75 %
Brady Statistic RV Percent Paced: 99 %
Date Time Interrogation Session: 20220808051350
Implantable Lead Implant Date: 20140408
Implantable Lead Implant Date: 20140408
Implantable Lead Location: 753859
Implantable Lead Location: 753860
Implantable Lead Model: 1944
Implantable Lead Model: 1948
Implantable Pulse Generator Implant Date: 20140408
Lead Channel Impedance Value: 490 Ohm
Lead Channel Impedance Value: 630 Ohm
Lead Channel Pacing Threshold Amplitude: 0.375 V
Lead Channel Pacing Threshold Amplitude: 0.625 V
Lead Channel Pacing Threshold Pulse Width: 0.4 ms
Lead Channel Pacing Threshold Pulse Width: 0.4 ms
Lead Channel Sensing Intrinsic Amplitude: 12 mV
Lead Channel Sensing Intrinsic Amplitude: 3.6 mV
Lead Channel Setting Pacing Amplitude: 0.875
Lead Channel Setting Pacing Amplitude: 1.375
Lead Channel Setting Pacing Pulse Width: 0.4 ms
Lead Channel Setting Sensing Sensitivity: 2 mV
Pulse Gen Model: 2210
Pulse Gen Serial Number: 7457437

## 2021-05-17 ENCOUNTER — Ambulatory Visit (INDEPENDENT_AMBULATORY_CARE_PROVIDER_SITE_OTHER): Payer: BC Managed Care – PPO | Admitting: Adult Health

## 2021-05-17 ENCOUNTER — Other Ambulatory Visit: Payer: Self-pay

## 2021-05-17 ENCOUNTER — Ambulatory Visit
Admission: RE | Admit: 2021-05-17 | Discharge: 2021-05-17 | Disposition: A | Discharge status: Home / Self Care | Payer: BC Managed Care – PPO | Source: Ambulatory Visit | Attending: Adult Health | Admitting: Adult Health

## 2021-05-17 ENCOUNTER — Encounter: Payer: Self-pay | Admitting: Adult Health

## 2021-05-17 VITALS — BP 150/82 | HR 60 | Temp 97.2°F | Wt 188.0 lb

## 2021-05-17 DIAGNOSIS — Z87442 Personal history of urinary calculi: Secondary | ICD-10-CM | POA: Insufficient documentation

## 2021-05-17 DIAGNOSIS — R109 Unspecified abdominal pain: Secondary | ICD-10-CM

## 2021-05-17 DIAGNOSIS — R0789 Other chest pain: Secondary | ICD-10-CM

## 2021-05-17 NOTE — Patient Instructions (Signed)
Try heat and ibuprofen or aleve tonight (take with food on stomach)    Flank Pain, Adult Flank pain is pain that is located on the side of the body between the upper abdomen and the back. This area is called the flank. The pain may occur over a short period of time (acute), or it may be long-term or recurring (chronic). It may be mild or severe. Flank pain can be caused by many things, including: Muscle soreness or injury. Kidney stones or kidney disease. Stress. A disease of the spine (vertebral disk disease). A lung infection (pneumonia). Fluid around the lungs (pulmonary edema). A skin rash caused by the chickenpox virus (shingles). Tumors that affect the back of the abdomen. Gallbladder disease. Follow these instructions at home:  Drink enough fluid to keep your urine clear or pale yellow. Rest as told by your health care provider. Take over-the-counter and prescription medicines only as told by your health care provider. Keep a journal to track what has caused your flank pain and what has made it feel better. Keep all follow-up visits as told by your health care provider. This is important. Contact a health care provider if: Your pain is not controlled with medicine. You have new symptoms. Your pain gets worse. You have a fever. Your symptoms last longer than 2-3 days. You have trouble urinating or you are urinating very frequently. Get help right away if: You have trouble breathing or you are short of breath. Your abdomen hurts or it is swollen or red. You have nausea or vomiting. You feel faint or you pass out. You have blood in your urine. Summary Flank pain is pain that is located on the side of the body between the upper abdomen and the back. The pain may occur over a short period of time (acute), or it may be long-term or recurring (chronic). It may be mild or severe. Flank pain can be caused by many things. Contact your health care provider if your symptoms get worse  or they last longer than 2-3 days. This information is not intended to replace advice given to you by your health care provider. Make sure you discuss any questions you have with your healthcare provider. Document Revised: 06/14/2020 Document Reviewed: 06/17/2020 Elsevier Patient Education  2022 [employer].

## 2021-05-17 NOTE — Progress Notes (Signed)
Assessment and Plan:  Alyssa Dudley was seen today for acute visit.  Diagnoses and all orders for this visit:  Right flank pain History of renal calculi Right-sided chest wall pain R flank vs chest wall pain, MSK vs r/o renal stone with her hx No specific injury; no GI sx or abdominal tenderness My main suspicion is MSK etiology at this point; pending workup suggested she try heat and OTC NSAID Check labs, will obtain imaging to evaluate for stone in area of pain If suggestive will have her follow up with est urology Dr. Dagoberto Ligas water intake;  Further treatment recommendations pending further workup results ED precautions if acutely worse; otherwise contact office for any changes -     DG Abd 1 View; Future -     CBC with Differential/Platelet -     COMPLETE METABOLIC PANEL WITH GFR -     Urinalysis w microscopic + reflex cultur -     DG Chest 2 View; Future  Further disposition pending results of labs. Discussed med's effects and SE's.   Over 30 minutes of exam, counseling, chart review, and critical decision making was performed.   Future Appointments  Date Time Provider [place]  08/14/2021  7:05 AM [facility] DEVICE REMOTES [facility]  11/13/2021  7:05 AM [facility] DEVICE REMOTES [facility]  04/05/2022 10:00 AM Mull, [name], NP [facility] None    ------------------------------------------------------------------------------------------------------------------   HPI BP (!) 150/82   Pulse 60   Temp (!) 97.2 F (36.2 C)   Wt 188 lb (85.3 kg)   SpO2 96%   BMI 31.28 kg/m  65 y.o.female with hx of GERD, PUD r/t H. Pylori, renal stones presents for evaluation of 2 weeks of RUQ abd/back flank pain.   She reports gradual onset of R flank/side pain, nagging and "sore", "pulling". She reports while she is awake has constant pain, 7/10, non-radiating. Denies injury prior to onset. She denies abdominal pain. She does have tenderness. Worse with  rotation, reaching up with her arms. She has taken tylenol without benefit. She is able to sleep by lying laterally with pillow, will improve to 2-3/10. Hasn't tried heat or NSAID.   She denies abdominal pain, constipation/diarrhea, nausea/vomiting, fever/chills, urinary frequency/urgency, dysuria, hematuria or other urine character change. She denies CP, dyspnea, cough. She has not noted any rash.   She had R renal stone per CT 12/2019, underwent lithotripsy by Dr. Lovena Neighbours but reports never saw stones pass.    Past Medical History:  Diagnosis Date   Allergy    once a year    Hyperlipidemia    borderline- no meds    Pacemaker    Paroxysmal atrial fibrillation ([place]) 6/15   detected on ppm interrogation   PONV (postoperative nausea and vomiting)    has vomitted in past   Second degree Mobitz II AV block    Vitamin D deficiency      Current Outpatient Medications on File Prior to Visit  Medication Sig   atenolol (TENORMIN) 50 MG tablet Take  1 tablet  Daily  for BP   triamcinolone cream (KENALOG) 0.1 % APPLY EXTERNALLY TO THE AFFECTED AREA THREE TIMES DAILY AS NEEDED FOR RASH   Vitamin D, Ergocalciferol, (DRISDOL) 1.25 MG (50000 UNIT) CAPS capsule TAKE ONE CAPSULE BY MOUTH 2 TIMES A WEEK (Patient taking differently: every 7 (seven) days. TAKE ONE CAPSULE BY MOUTH 2 TIMES A WEEK)   No current facility-administered medications on file prior to visit.   Allergies: No Known Allergies  Surgical History:  She  has a past surgical history that includes Breast cyst excision (1980); Wrist surgery (Right, 2011); permanent pacemaker insertion (N/A, 01/13/2013); Colonoscopy; Polypectomy; Plantar fasciectomy; and Extracorporeal shock wave lithotripsy (Right, 12/28/2019). Family History:  Herfamily history includes Anemia in her sister; Cervical cancer in her mother; Colon cancer (age of onset: 70) in her sister; Diabetes in her mother; HIV/AIDS in her brother; Heart disease in her father; Hyperlipidemia  in her father; Hypertension in her mother and son; Kidney disease in her son; Multiple myeloma in her maternal aunt; Multiple myeloma (age of onset: 18) in her son; Prostate cancer (age of onset: 53) in her brother. Social History:   reports that she has never smoked. She has never used smokeless tobacco. She reports that she does not drink alcohol and does not use drugs.  ROS: all negative except above.   Physical Exam:  BP (!) 150/82   Pulse 60   Temp (!) 97.2 F (36.2 C)   Wt 188 lb (85.3 kg)   SpO2 96%   BMI 31.28 kg/m   General Appearance: Well nourished, well dressed AA adult female in no apparent distress, sitting calmly.  Eyes: PERRLA, conjunctiva no swelling or erythema ENT/Mouth: Mask in place; Hearing normal.  Neck: Supple Respiratory: Respiratory effort normal, BS equal bilaterally without rales, rhonchi, wheezing or stridor.  Cardio: RRR with no MRGs. Brisk peripheral pulses without edema.  Abdomen: Soft, + BS.  Non tender, no guarding, rebound, hernias, masses. No CVA tenderness.  Lymphatics: Non tender without lymphadenopathy.  Musculoskeletal: Full ROM, 5/5 strength, normal gait. She has R lower post/lateral rib wall/flank area pain, localized but without palpable bony abnormality or muscle spasm.  Skin: Warm, dry without rashes, lesions, ecchymosis.  Neuro: Normal muscle tone Psych: Awake and oriented X 3, normal affect, Insight and Judgment appropriate.     Izora Ribas, NP 3:53 PM [facility]

## 2021-05-18 LAB — CBC WITH DIFFERENTIAL/PLATELET
Absolute Monocytes: 655 cells/uL (ref 200–950)
Basophils Absolute: 18 cells/uL (ref 0–200)
Basophils Relative: 0.3 %
Eosinophils Absolute: 142 cells/uL (ref 15–500)
Eosinophils Relative: 2.4 %
HCT: 42.5 % (ref 35.0–45.0)
Hemoglobin: 13.6 g/dL (ref 11.7–15.5)
Lymphs Abs: 2803 cells/uL (ref 850–3900)
MCH: 29.4 pg (ref 27.0–33.0)
MCHC: 32 g/dL (ref 32.0–36.0)
MCV: 92 fL (ref 80.0–100.0)
MPV: 10.3 fL (ref 7.5–12.5)
Monocytes Relative: 11.1 %
Neutro Abs: 2283 cells/uL (ref 1500–7800)
Neutrophils Relative %: 38.7 %
Platelets: 239 10*3/uL (ref 140–400)
RBC: 4.62 10*6/uL (ref 3.80–5.10)
RDW: 13.3 % (ref 11.0–15.0)
Total Lymphocyte: 47.5 %
WBC: 5.9 10*3/uL (ref 3.8–10.8)

## 2021-05-18 LAB — COMPLETE METABOLIC PANEL WITH GFR
AG Ratio: 1.4 (calc) (ref 1.0–2.5)
ALT: 17 U/L (ref 6–29)
AST: 18 U/L (ref 10–35)
Albumin: 4.5 g/dL (ref 3.6–5.1)
Alkaline phosphatase (APISO): 113 U/L (ref 37–153)
BUN: 14 mg/dL (ref 7–25)
CO2: 30 mmol/L (ref 20–32)
Calcium: 9.9 mg/dL (ref 8.6–10.4)
Chloride: 107 mmol/L (ref 98–110)
Creat: 0.74 mg/dL (ref 0.50–1.05)
Globulin: 3.2 g/dL (calc) (ref 1.9–3.7)
Glucose, Bld: 82 mg/dL (ref 65–99)
Potassium: 4.5 mmol/L (ref 3.5–5.3)
Sodium: 146 mmol/L (ref 135–146)
Total Bilirubin: 0.7 mg/dL (ref 0.2–1.2)
Total Protein: 7.7 g/dL (ref 6.1–8.1)
eGFR: 90 mL/min/{1.73_m2} (ref >=60)

## 2021-05-18 LAB — URINALYSIS W MICROSCOPIC + REFLEX CULTURE
Bilirubin Urine: NEGATIVE
Glucose, UA: NEGATIVE
Hgb urine dipstick: NEGATIVE
Hyaline Cast: NONE SEEN /LPF
Ketones, ur: NEGATIVE
Leukocyte Esterase: NEGATIVE
Nitrites, Initial: NEGATIVE
Protein, ur: NEGATIVE
Specific Gravity, Urine: 1.026 (ref 1.001–1.035)
pH: 5.5 (ref 5.0–8.0)

## 2021-05-18 LAB — NO CULTURE INDICATED

## 2021-06-07 NOTE — Progress Notes (Signed)
Remote pacemaker transmission.   

## 2021-06-12 ENCOUNTER — Encounter: Payer: Self-pay | Admitting: Internal Medicine

## 2021-07-17 ENCOUNTER — Encounter: Payer: Self-pay | Admitting: Internal Medicine

## 2021-07-21 ENCOUNTER — Encounter: Payer: Self-pay | Admitting: Internal Medicine

## 2021-07-21 ENCOUNTER — Ambulatory Visit (AMBULATORY_SURGERY_CENTER): Payer: Self-pay

## 2021-07-21 DIAGNOSIS — Z800 Family history of malignant neoplasm of digestive organs: Secondary | ICD-10-CM

## 2021-07-21 DIAGNOSIS — Z86010 Personal history of colonic polyps: Secondary | ICD-10-CM

## 2021-07-21 MED ORDER — SUTAB 1479-225-188 MG PO TABS: ORAL_TABLET | ORAL | 0 refills | Status: DC

## 2021-07-21 NOTE — Progress Notes (Signed)
No egg or soy allergy known to patient  No issues with past sedation with any surgeries or procedures Patient denies ever being told they had issues or difficulty with intubation  No FH of Malignant Hyperthermia No diet pills per patient No home 02 use per patient  No blood thinners per patient  Pt denies issues with constipation  No A fib or A flutter --history of-not current issue  COVID 19 guidelines implemented in [place] today with Pt and RN  Pt is fully vaccinated  for Covid      Due to the COVID-19 pandemic we are asking patients to follow certain guidelines.  Pt aware of COVID protocols and [facility] guidelines

## 2021-07-28 ENCOUNTER — Encounter: Payer: Self-pay | Admitting: Internal Medicine

## 2021-07-28 ENCOUNTER — Ambulatory Visit (AMBULATORY_SURGERY_CENTER): Payer: BC Managed Care – PPO | Admitting: Internal Medicine

## 2021-07-28 VITALS — BP 147/81 | HR 59 | Temp 94.8°F | Resp 17 | Ht 65.0 in | Wt 188.0 lb

## 2021-07-28 DIAGNOSIS — Z86010 Personal history of colonic polyps: Secondary | ICD-10-CM

## 2021-07-28 DIAGNOSIS — Z1211 Encounter for screening for malignant neoplasm of colon: Secondary | ICD-10-CM | POA: Diagnosis not present

## 2021-07-28 DIAGNOSIS — K635 Polyp of colon: Secondary | ICD-10-CM | POA: Diagnosis not present

## 2021-07-28 DIAGNOSIS — D125 Benign neoplasm of sigmoid colon: Secondary | ICD-10-CM

## 2021-07-28 DIAGNOSIS — Z800 Family history of malignant neoplasm of digestive organs: Secondary | ICD-10-CM | POA: Diagnosis not present

## 2021-07-28 DIAGNOSIS — E755 Other lipid storage disorders: Secondary | ICD-10-CM | POA: Diagnosis not present

## 2021-07-28 MED ORDER — SODIUM CHLORIDE 0.9 % IV SOLN: 500.0000 mL | Freq: Once | INTRAVENOUS | Status: AC

## 2021-07-28 NOTE — Progress Notes (Signed)
Called to room to assist during endoscopic procedure.  Patient ID and intended procedure confirmed with present staff. Received instructions for my participation in the procedure from the performing physician.  

## 2021-07-28 NOTE — Op Note (Signed)
[place] Patient Name: Alyssa Dudley Procedure Date: 07/28/2021 11:41 AM MRN: [id number] Endoscopist: Jerene Bears , MD Age: 65 Referring MD:  Date of Birth: Jul 08, 1956 Gender: Female Account #: [id number] Procedure:                Colonoscopy Indications:              High risk colon cancer surveillance: Personal                            history of multiple adenomas, Family history of                            colon cancer in a first-degree relative before age                            17 years, Last colonoscopy: August 2019 (6 adenomas                            removed) Medicines:                Monitored Anesthesia Care Procedure:                Pre-Anesthesia Assessment:                           - Prior to the procedure, a History and Physical                            was performed, and patient medications and                            allergies were reviewed. The patient's tolerance of                            previous anesthesia was also reviewed. The risks                            and benefits of the procedure and the sedation                            options and risks were discussed with the patient.                            All questions were answered, and informed consent                            was obtained. Prior Anticoagulants: The patient has                            taken no previous anticoagulant or antiplatelet                            agents. ASA Grade Assessment: II - A patient with  mild systemic disease. After reviewing the risks                            and benefits, the patient was deemed in                            satisfactory condition to undergo the procedure.                           After obtaining informed consent, the colonoscope                            was passed under direct vision. Throughout the                            procedure, the patient's blood pressure, pulse, and                             oxygen saturations were monitored continuously. The                            PCF-HQ190L Colonoscope was introduced through the                            anus and advanced to the terminal ileum. The                            colonoscopy was performed without difficulty. The                            patient tolerated the procedure well. The quality                            of the bowel preparation was good. The ileocecal                            valve, appendiceal orifice, and rectum were                            photographed. Scope In: 11:51:40 AM Scope Out: 12:05:11 PM Scope Withdrawal Time: 0 hours 11 minutes 8 seconds  Total Procedure Duration: 0 hours 13 minutes 31 seconds  Findings:                 The digital rectal exam was normal.                           The terminal ileum appeared normal.                           A 4 mm polyp was found in the sigmoid colon. The                            polyp was sessile. The polyp was removed with a  cold snare. Resection and retrieval were complete.                           A few small-mouthed diverticula were found in the                            sigmoid colon.                           Internal hemorrhoids were found during                            retroflexion. The hemorrhoids were medium-sized.                           The exam was otherwise without abnormality. Complications:            No immediate complications. Estimated Blood Loss:     Estimated blood loss: none. Impression:               - The examined portion of the ileum was normal.                           - One 4 mm polyp in the sigmoid colon, removed with                            a cold snare. Resected and retrieved.                           - Diverticulosis in the sigmoid colon.                           - Internal hemorrhoids.                           - The examination was otherwise  normal. Recommendation:           - Patient has a contact number available for                            emergencies. The signs and symptoms of potential                            delayed complications were discussed with the                            patient. Return to normal activities tomorrow.                            Written discharge instructions were provided to the                            patient.                           - Resume previous diet.                           -  Continue present medications.                           - Await pathology results.                           - Repeat colonoscopy in 5 years for surveillance. Jerene Bears, MD 07/28/2021 12:08:26 PM This report has been signed electronically.

## 2021-07-28 NOTE — Patient Instructions (Signed)
Resume previous medications.  1 polyp removed and sent to pathology.  Await path for final recommendations.  Handouts on findings (hemorrhoids and polyps) given to patient.      YOU HAD AN ENDOSCOPIC PROCEDURE TODAY AT [facility]:   Refer to the procedure report that was given to you for any specific questions about what was found during the examination.  If the procedure report does not answer your questions, please call your gastroenterologist to clarify.  If you requested that your care partner not be given the details of your procedure findings, then the procedure report has been included in a sealed envelope for you to review at your convenience later.  YOU SHOULD EXPECT: Some feelings of bloating in the abdomen. Passage of more gas than usual.  Walking can help get rid of the air that was put into your GI tract during the procedure and reduce the bloating. If you had a lower endoscopy (such as a colonoscopy or flexible sigmoidoscopy) you may notice spotting of blood in your stool or on the toilet paper. If you underwent a bowel prep for your procedure, you may not have a normal bowel movement for a few days.  Please Note:  You might notice some irritation and congestion in your nose or some drainage.  This is from the oxygen used during your procedure.  There is no need for concern and it should clear up in a day or so.  SYMPTOMS TO REPORT IMMEDIATELY:  Following lower endoscopy (colonoscopy or flexible sigmoidoscopy):  Excessive amounts of blood in the stool  Significant tenderness or worsening of abdominal pains  Swelling of the abdomen that is new, acute  Fever of 100F or higher   For urgent or emergent issues, a gastroenterologist can be reached at any hour by calling [phone]. Do not use MyChart messaging for urgent concerns.    DIET:  We do recommend a small meal at first, but then you may proceed to your regular diet.  Drink plenty of fluids but you should  avoid alcoholic beverages for 24 hours.  ACTIVITY:  You should plan to take it easy for the rest of today and you should NOT DRIVE or use heavy machinery until tomorrow (because of the sedation medicines used during the test).    FOLLOW UP: Our staff will call the number listed on your records 48-72 hours following your procedure to check on you and address any questions or concerns that you may have regarding the information given to you following your procedure. If we do not reach you, we will leave a message.  We will attempt to reach you two times.  During this call, we will ask if you have developed any symptoms of COVID 19. If you develop any symptoms (ie: fever, flu-like symptoms, shortness of breath, cough etc.) before then, please call [phone].  If you test positive for Covid 19 in the 2 weeks post procedure, please call and report this information to [place].    If any biopsies were taken you will be contacted by phone or by letter within the next 1-3 weeks.  Please call us at [phone] if you have not heard about the biopsies in 3 weeks.    SIGNATURES/CONFIDENTIALITY: You and/or your care partner have signed paperwork which will be entered into your electronic medical record.  These signatures attest to the fact that that the information above on your After Visit Summary has been reviewed and is understood.  Full responsibility  of the confidentiality of this discharge information lies with you and/or your care-partner.

## 2021-07-28 NOTE — Progress Notes (Signed)
GASTROENTEROLOGY PROCEDURE H&P NOTE   Primary Care Physician: Unk Pinto, MD    Reason for Procedure:  History of adenomatous colon polyps and family history of colon cancer  Plan:    Colonoscopy  Patient is appropriate for endoscopic procedure(s) in the ambulatory ([place]) setting.  The nature of the procedure, as well as the risks, benefits, and alternatives were carefully and thoroughly reviewed with the patient. Ample time for discussion and questions allowed. The patient understood, was satisfied, and agreed to proceed.     HPI: Alyssa Dudley is a 65 y.o. female who presents for surveillance colonoscopy.  Tolerated the prep.  Referred S[place] to the previous liquid based preps.  No chest pain or shortness of breath.  Medical history as below.  Past Medical History:  Diagnosis Date   Allergy    once a year    Hyperlipidemia    borderline- no meds    Pacemaker    Paroxysmal atrial fibrillation ([place]) 6/15   detected on ppm interrogation   PONV (postoperative nausea and vomiting)    has vomitted in past   Second degree Mobitz II AV block    Vitamin D deficiency     Past Surgical History:  Procedure Laterality Date   BREAST CYST EXCISION  1980   COLONOSCOPY     EXTRACORPOREAL SHOCK WAVE LITHOTRIPSY Right 12/28/2019   Procedure: RIGHT EXTRACORPOREAL SHOCK WAVE LITHOTRIPSY (ESWL);  Surgeon: Raynelle Bring, MD;  Location[facility];  Service: Urology;  Laterality: Right;   PERMANENT PACEMAKER INSERTION N/A 01/13/2013   SJM [employer] DR RF implanted by Dr [name] for mobitz II AV block   PLANTAR FASCIECTOMY     POLYPECTOMY     WRIST SURGERY Right 2011   removed cyst    Prior to Admission medications   Medication Sig Start Date End Date Taking? Authorizing Provider  aspirin 81 MG chewable tablet Chew by mouth daily.   Yes [provider]  atenolol (TENORMIN) 50 MG tablet Take  1 tablet  Daily  for BP 11/06/20  Yes Unk Pinto, MD  Vitamin  D, Ergocalciferol, (DRISDOL) 1.25 MG (50000 UNIT) CAPS capsule TAKE ONE CAPSULE BY MOUTH 2 TIMES A WEEK Patient taking differently: every 7 (seven) days. TAKE ONE CAPSULE BY MOUTH 2 TIMES A WEEK 03/11/21  Yes Unk Pinto, MD  triamcinolone cream (KENALOG) 0.1 % APPLY EXTERNALLY TO THE AFFECTED AREA THREE TIMES DAILY AS NEEDED FOR RASH Patient not taking: Reported on 07/28/2021 04/03/20   Vladimir Crofts, PA-C    Current Outpatient Medications  Medication Sig Dispense Refill   aspirin 81 MG chewable tablet Chew by mouth daily.     atenolol (TENORMIN) 50 MG tablet Take  1 tablet  Daily  for BP 90 tablet 3   Vitamin D, Ergocalciferol, (DRISDOL) 1.25 MG (50000 UNIT) CAPS capsule TAKE ONE CAPSULE BY MOUTH 2 TIMES A WEEK (Patient taking differently: every 7 (seven) days. TAKE ONE CAPSULE BY MOUTH 2 TIMES A WEEK) 26 capsule 3   triamcinolone cream (KENALOG) 0.1 % APPLY EXTERNALLY TO THE AFFECTED AREA THREE TIMES DAILY AS NEEDED FOR RASH (Patient not taking: Reported on 07/28/2021) 30 g 3   Current Facility-Administered Medications  Medication Dose Route Frequency Provider Last Rate Last Admin   0.9 %  sodium chloride infusion  500 mL Intravenous Once Cresencio Reesor, Lajuan Lines, MD        Allergies as of 07/28/2021   (No Known Allergies)    Family History  Problem Relation  Age of Onset   Cervical cancer Mother    Hypertension Mother    Diabetes Mother    Heart disease Father    Hyperlipidemia Father    Colon cancer Sister 47   Anemia Sister    Leukemia Sister    Prostate cancer Brother 35   HIV/AIDS Brother    Multiple myeloma Son 24   Hypertension Son    Kidney disease Son    Multiple myeloma Maternal Aunt    Colon polyps Neg Hx    Rectal cancer Neg Hx    Stomach cancer Neg Hx    Liver disease Neg Hx    Esophageal cancer Neg Hx     Social History   Socioeconomic History   Marital status: Married    Spouse name: Not on file   Number of children: 1   Years of education: Not on file    Highest education level: Not on file  Occupational History   Not on file  Tobacco Use   Smoking status: Never   Smokeless tobacco: Never  Vaping Use   Vaping Use: Never used  Substance and Sexual Activity   Alcohol use: No   Drug use: No   Sexual activity: Not Currently  Other Topics Concern   Not on file  Social History Narrative   Not on file   Social Determinants of Health   Financial Resource Strain: Not on file  Food Insecurity: Not on file  Transportation Needs: Not on file  Physical Activity: Not on file  Stress: Not on file  Social Connections: Not on file  Intimate Partner Violence: Not on file    Physical Exam: Vital signs in last 24 hours: '@BP'  (!) 177/76   Pulse 61   Temp (!) 94.8 F (34.9 C) (Temporal)   Ht '5\' 5"'  (1.651 m)   Wt 188 lb (85.3 kg)   SpO2 100%   BMI 31.28 kg/m  GEN: NAD EYE: Sclerae anicteric ENT: MMM CV: Non-tachycardic Pulm: CTA b/l GI: Soft, NT/ND NEURO:  Alert & Oriented x 3   Zenovia Jarred, MD [facility]  07/28/2021 11:47 AM

## 2021-07-28 NOTE — Progress Notes (Signed)
Sedate, gd SR, tolerated procedure well, VSS, report to RN 

## 2021-07-28 NOTE — Progress Notes (Signed)
Pt's states no medical or surgical changes since previsit or office visit. 

## 2021-08-01 ENCOUNTER — Encounter: Payer: Self-pay | Admitting: Internal Medicine

## 2021-08-01 ENCOUNTER — Telehealth: Payer: Self-pay | Admitting: *Deleted

## 2021-08-01 NOTE — Telephone Encounter (Signed)
  Follow up Call-  Call back number 07/28/2021  Post procedure Call Back phone  # [phone]  Permission to leave phone message Yes  Some recent data might be hidden     Patient questions:  Do you have a fever, pain , or abdominal swelling? No. Pain Score  0 *  Have you tolerated food without any problems? Yes.    Have you been able to return to your normal activities? Yes.    Do you have any questions about your discharge instructions: Diet   No. Medications  No. Follow up visit  No.  Do you have questions or concerns about your Care? No.  Actions: * If pain score is 4 or above: No action needed, pain <4.  Have you developed a fever since your procedure? no  2.   Have you had an respiratory symptoms (SOB or cough) since your procedure? no  3.   Have you tested positive for COVID 19 since your procedure no  4.   Have you had any family members/close contacts diagnosed with the COVID 19 since your procedure?  no   If yes to any of these questions please route to Joylene John, RN and Joella Prince, RN

## 2021-08-04 ENCOUNTER — Ambulatory Visit (INDEPENDENT_AMBULATORY_CARE_PROVIDER_SITE_OTHER): Payer: BC Managed Care – PPO

## 2021-08-04 ENCOUNTER — Other Ambulatory Visit: Payer: Self-pay

## 2021-08-04 VITALS — Temp 97.9°F

## 2021-08-04 DIAGNOSIS — Z23 Encounter for immunization: Secondary | ICD-10-CM | POA: Diagnosis not present

## 2021-08-10 DIAGNOSIS — S93492A Sprain of other ligament of left ankle, initial encounter: Secondary | ICD-10-CM | POA: Diagnosis not present

## 2021-08-14 ENCOUNTER — Ambulatory Visit (INDEPENDENT_AMBULATORY_CARE_PROVIDER_SITE_OTHER): Payer: BC Managed Care – PPO

## 2021-08-14 DIAGNOSIS — I441 Atrioventricular block, second degree: Secondary | ICD-10-CM | POA: Diagnosis not present

## 2021-08-16 LAB — CUP PACEART REMOTE DEVICE CHECK
Battery Remaining Longevity: 22 mo
Battery Remaining Percentage: 16 %
Battery Voltage: 2.87 V
Brady Statistic AP VP Percent: 74 %
Brady Statistic AP VS Percent: 1 %
Brady Statistic AS VP Percent: 25 %
Brady Statistic AS VS Percent: 1 %
Brady Statistic RA Percent Paced: 74 %
Brady Statistic RV Percent Paced: 99 %
Date Time Interrogation Session: 20221108171000
Implantable Lead Implant Date: 20140408
Implantable Lead Implant Date: 20140408
Implantable Lead Location: 753859
Implantable Lead Location: 753860
Implantable Lead Model: 1944
Implantable Lead Model: 1948
Implantable Pulse Generator Implant Date: 20140408
Lead Channel Impedance Value: 530 Ohm
Lead Channel Impedance Value: 640 Ohm
Lead Channel Pacing Threshold Amplitude: 0.375 V
Lead Channel Pacing Threshold Amplitude: 0.625 V
Lead Channel Pacing Threshold Pulse Width: 0.4 ms
Lead Channel Pacing Threshold Pulse Width: 0.4 ms
Lead Channel Sensing Intrinsic Amplitude: 12 mV
Lead Channel Sensing Intrinsic Amplitude: 4.1 mV
Lead Channel Setting Pacing Amplitude: 0.875
Lead Channel Setting Pacing Amplitude: 1.375
Lead Channel Setting Pacing Pulse Width: 0.4 ms
Lead Channel Setting Sensing Sensitivity: 2 mV
Pulse Gen Model: 2210
Pulse Gen Serial Number: 7457437

## 2021-08-22 NOTE — Progress Notes (Signed)
Remote pacemaker transmission.   

## 2021-09-08 DIAGNOSIS — Z1231 Encounter for screening mammogram for malignant neoplasm of breast: Secondary | ICD-10-CM | POA: Diagnosis not present

## 2021-09-08 DIAGNOSIS — Z780 Asymptomatic menopausal state: Secondary | ICD-10-CM | POA: Diagnosis not present

## 2021-09-08 LAB — HM MAMMOGRAPHY

## 2021-09-08 LAB — HM DEXA SCAN

## 2021-09-11 ENCOUNTER — Encounter: Payer: Self-pay | Admitting: Internal Medicine

## 2021-09-14 ENCOUNTER — Encounter: Payer: Self-pay | Admitting: Internal Medicine

## 2021-09-24 ENCOUNTER — Other Ambulatory Visit: Payer: Self-pay | Admitting: Adult Health Nurse Practitioner

## 2021-09-24 DIAGNOSIS — E559 Vitamin D deficiency, unspecified: Secondary | ICD-10-CM

## 2021-10-23 ENCOUNTER — Encounter: Payer: Self-pay | Admitting: Internal Medicine

## 2021-10-31 ENCOUNTER — Other Ambulatory Visit: Payer: Self-pay

## 2021-10-31 MED ORDER — ATENOLOL 50 MG PO TABS: ORAL_TABLET | ORAL | 3 refills | Status: DC

## 2021-11-13 ENCOUNTER — Ambulatory Visit (INDEPENDENT_AMBULATORY_CARE_PROVIDER_SITE_OTHER): Payer: BC Managed Care – PPO

## 2021-11-13 DIAGNOSIS — I441 Atrioventricular block, second degree: Secondary | ICD-10-CM | POA: Diagnosis not present

## 2021-11-14 LAB — CUP PACEART REMOTE DEVICE CHECK
Battery Remaining Longevity: 18 mo
Battery Remaining Percentage: 14 %
Battery Voltage: 2.86 V
Brady Statistic AP VP Percent: 74 %
Brady Statistic AP VS Percent: 1 %
Brady Statistic AS VP Percent: 26 %
Brady Statistic AS VS Percent: 1 %
Brady Statistic RA Percent Paced: 74 %
Brady Statistic RV Percent Paced: 99 %
Date Time Interrogation Session: 20230207075458
Implantable Lead Implant Date: 20140408
Implantable Lead Implant Date: 20140408
Implantable Lead Location: 753859
Implantable Lead Location: 753860
Implantable Lead Model: 1944
Implantable Lead Model: 1948
Implantable Pulse Generator Implant Date: 20140408
Lead Channel Impedance Value: 530 Ohm
Lead Channel Impedance Value: 640 Ohm
Lead Channel Pacing Threshold Amplitude: 0.5 V
Lead Channel Pacing Threshold Amplitude: 0.625 V
Lead Channel Pacing Threshold Pulse Width: 0.4 ms
Lead Channel Pacing Threshold Pulse Width: 0.4 ms
Lead Channel Sensing Intrinsic Amplitude: 12 mV
Lead Channel Sensing Intrinsic Amplitude: 4.3 mV
Lead Channel Setting Pacing Amplitude: 0.875
Lead Channel Setting Pacing Amplitude: 1.5 V
Lead Channel Setting Pacing Pulse Width: 0.4 ms
Lead Channel Setting Sensing Sensitivity: 2 mV
Pulse Gen Model: 2210
Pulse Gen Serial Number: 7457437

## 2021-11-16 NOTE — Progress Notes (Signed)
Remote pacemaker transmission.   

## 2021-12-24 NOTE — Progress Notes (Signed)
? ?Cardiology Office Note ?Date:  12/24/2021  ?Patient ID:  Curry Dulski, DOB [birth year], MRN [id number] ?PCP:  Unk Pinto, MD  ?Electrophysiologist: Dr. [name] ?  ?Chief Complaint:   annual visit  ? ?History of Present Illness: ?Niza Kerrick is a 66 y.o. female with history of HLD, AFib, Mobitz II AV block > PPM, GERD, HTN ? ?She come today to be seen for Dr. [name], last seen by him Aug 2020 via tele health visit. 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 ?She was overdue for remotes, discussed getting her back on track with that.  Afib episodes were very short and infrequent, with CHA2DS2Vasc score of one, not on a/c. ? ?I saw her march 2022 ?We dicussed her ER visit march last year, BP was >200/>100 associated with posterior headache and some floaters, felt to have been the HTN, not stroke. ?Since increase in her atenolol, BP has been controlled without recurrent symptomsl. ?No CP ?In the last [place] has felt like she has had more flutters, there are brief, not daily or weekly even. And feels like she gets a bit winded with activities that she did not used to. ?No dizzy spells, near syncope or syncope. ?Her PMD monitors her labs/lipids ?Her transmitter does not seem to be working ?I suspect her charted Dec weight of 167 was entered wrong ?Planned to update her echo with some DOE ?Ordered for a new transmitter ? ?LVEF 60-65%, no WMA, Grade I DD, MR seen due to long AV delay, trivial/mild ?She was encouraged to start to walk/exercise to improve exertional capacity ? ? ?TODAY ?She is doing very well ?Did get started walking and her exertional capacity is good ?No concerns, denies any CP, palpitations, or cardiac awareness ?No SOB ?PMD monitors and manages her labs ?  ? ?Device information ?SJM dual chamber PPM implanted 01/13/2013 ? ? ?Past Medical History:  ?Diagnosis Date  ? Allergy   ? once a year   ? Hyperlipidemia   ? borderline- no meds   ? Pacemaker    ? [place]  ? detected on ppm interrogation  ? PONV (postoperative nausea and vomiting)   ? has vomitted in past  ? Second degree Mobitz II AV block   ? Vitamin D deficiency   ? ? ?Past Surgical History:  ?Procedure Laterality Date  ? BREAST CYST EXCISION  1980  ? COLONOSCOPY    ? EXTRACORPOREAL SHOCK WAVE LITHOTRIPSY Right 12/28/2019  ? Procedure: RIGHT EXTRACORPOREAL SHOCK WAVE LITHOTRIPSY (ESWL);  Surgeon: BRaynelle Bring MD;  [facility]  Service: Urology;  Laterality: Right;  ? PERMANENT PACEMAKER INSERTION N/A 01/13/2013  ? SJM Acc[employer] DR RF implanted by Dr ARayann Hemanfor mobitz II AV block  ? PLANTAR FASCIECTOMY    ? POLYPECTOMY    ? WRIST SURGERY Right 2011  ? removed cyst  ? ? ?Current Outpatient Medications  ?Medication Sig Dispense Refill  ? aspirin 81 MG chewable tablet Chew by mouth daily.    ? atenolol (TENORMIN) 50 MG tablet Take  1 tablet  Daily  for BP 90 tablet 3  ? triamcinolone cream (KENALOG) 0.1 % APPLY EXTERNALLY TO THE AFFECTED AREA THREE TIMES DAILY AS NEEDED FOR RASH (Patient not taking: Reported on 07/28/2021) 30 g 3  ? Vitamin D, Ergocalciferol, (DRISDOL) 1.25 MG (50000 UNIT) CAPS capsule TAKE 1 CAPSULE BY MOUTH TWICE WEEKLY 180 capsule 0  ? ?Current Facility-Administered Medications  ?Medication Dose  Route Frequency Provider Last Rate Last Admin  ? 0.9 %  sodium chloride infusion  500 mL Intravenous Once Pyrtle, Lajuan Lines, MD      ? ? ?Allergies:   Patient has no known allergies.  ? ?Social History:  The patient  reports that she has never smoked. She has never used smokeless tobacco. She reports that she does not drink alcohol and does not use drugs.  ? ?Family History:  The patient's family history includes Anemia in her sister; Cervical cancer in her mother; Colon cancer (age of onset: 37) in her sister; Diabetes in her mother; HIV/AIDS in her brother; Heart disease in her father; Hyperlipidemia in her father; Hypertension in her mother  and son; Kidney disease in her son; Leukemia in her sister; Multiple myeloma in her maternal aunt; Multiple myeloma (age of onset: 29) in her son; Prostate cancer (age of onset: 65) in her brother. ? ?ROS:  Please see the history of present illness.    ?All other systems are reviewed and otherwise negative.  ? ?PHYSICAL EXAM:  ?VS:  There were no vitals taken for this visit. BMI: There is no height or weight on file to calculate BMI. ?Well nourished, well developed, in no acute distress ?HEENT: normocephalic, atraumatic ?Neck: no JVD, carotid bruits or masses ?Cardiac:  RRR; no significant murmurs, no rubs, or gallops ?Lungs:  CTA b/l, no wheezing, rhonchi or rales ?Abd: soft, nontender ?MS: no deformity or atrophy ?Ext: no edema ?Skin: warm and dry, no rash ?Neuro:  No gross deficits appreciated ?Psych: euthymic mood, full affect ? ?PPM site is stable, no tethering or discomfort ? ? ?EKG:  Done today and reviewed by myself shows  ?AV paced 63bpm  ? ?Device interrogation done today and reviewed by myself:  ?Battery and lead measurements are good ?She is dependent at 50bpm, and symptomatic ?+ AMS, some are true AF, ithers PACs, or brief at ?All <1mnute duration ?Burden <1% ?One PMT ?No retrograde today ? ?01/25/21: TTE ? 1. Left ventricular ejection fraction, by estimation, is 60 to 65%. Left  ?ventricular ejection fraction by 3D volume is 67 %. The left ventricle has  ?normal function. The left ventricle has no regional wall motion  ?abnormalities. Left ventricular diastolic  ? parameters are consistent with Grade I diastolic dysfunction (impaired  ?relaxation).  ? 2. Right ventricular systolic function is normal. The right ventricular  ?size is normal. There is normal pulmonary artery systolic pressure.  ? 3. Mild diastolic mitral insufficiency is seen due to long AV delay. The  ?mitral valve is normal in structure. Trivial mitral valve regurgitation.  ?No evidence of mitral stenosis.  ? 4. Tricuspid valve  regurgitation is mild to moderate.  ? 5. The aortic valve is normal in structure. Aortic valve regurgitation is  ?not visualized. No aortic stenosis is present.  ? 6. The inferior vena cava is normal in size with greater than 50%  ?respiratory variability, suggesting right atrial pressure of 3 mmHg.  ? ?01/07/2013: TTE ?Study Conclusions  ?- Left ventricle: The cavity size was normal. Systolic  ?  function was normal. The estimated ejection fraction was  ?  in the range of 60% to 65%. Wall motion was normal; there  ?  were no regional wall motion abnormalities.  ?- Atrial septum: No defect or patent foramen ovale was  ?  identified.  ?- Pulmonary arteries: PA peak pressure: 330mHg (S). ? ? ?Recent Labs: ?04/05/2021: Magnesium 2.0; TSH 2.58 ?05/17/2021: ALT 17; BUN  14; Creat 0.74; Hemoglobin 13.6; Platelets 239; Potassium 4.5; Sodium 146  ?04/05/2021: Cholesterol 175; HDL 50; LDL Cholesterol (Calc) 111; Total CHOL/HDL Ratio 3.5; Triglycerides 57  ? ?CrCl cannot be calculated (Patient's most recent lab result is older than the maximum 21 days allowed.).  ? ?Wt Readings from Last 3 Encounters:  ?07/28/21 188 lb (85.3 kg)  ?05/17/21 188 lb (85.3 kg)  ?04/05/21 190 lb 3.2 oz (86.3 kg)  ?  ? ?Other studies reviewed: ?Additional studies/records reviewed today include: summarized above ? ?ASSESSMENT AND PLAN: ? ?1. PPM ?    Intact function ?    No programming changes made ? ?2. Paroxysmal Afib ?    CHA2DS2Vasc is 2 (including gender) ?    <1%  % Burden, again not all were true AF ?    We discussed this again today, no a/c yet ? ?3. HLD ?    Monitored and managed by her PMD ? ?Disposition: remotes as usual, in c linic with [place] in 1 years, she will transition to Dr. Quentin Ore with Dr Jackalyn Lombard departure/slow down ? ? ? ?Current medicines are reviewed at length with the patient today.  The patient did not have any concerns regarding medicines. ? ?Signed, ?Tommye Standard, PA-C ?12/24/2021 6:35 PM    ? ?[facility] ?[address] ?[address] ?[place] [address] ?[phone] (office)  ?[phone] (fax) ? ? ?

## 2021-12-28 ENCOUNTER — Encounter: Payer: Self-pay | Admitting: Physician Assistant

## 2021-12-28 ENCOUNTER — Ambulatory Visit: Payer: BC Managed Care – PPO | Admitting: Physician Assistant

## 2021-12-28 ENCOUNTER — Other Ambulatory Visit: Payer: Self-pay

## 2021-12-28 VITALS — BP 138/88 | HR 63 | Ht 66.0 in | Wt 187.6 lb

## 2021-12-28 DIAGNOSIS — I480 Paroxysmal atrial fibrillation: Secondary | ICD-10-CM

## 2021-12-28 DIAGNOSIS — Z950 Presence of cardiac pacemaker: Secondary | ICD-10-CM | POA: Diagnosis not present

## 2021-12-28 DIAGNOSIS — I441 Atrioventricular block, second degree: Secondary | ICD-10-CM | POA: Diagnosis not present

## 2021-12-28 DIAGNOSIS — I4819 Other persistent atrial fibrillation: Secondary | ICD-10-CM | POA: Diagnosis not present

## 2021-12-28 LAB — CUP PACEART INCLINIC DEVICE CHECK
Battery Remaining Longevity: 16 mo
Battery Voltage: 2.86 V
Brady Statistic RA Percent Paced: 73 %
Brady Statistic RV Percent Paced: 99.89 %
Date Time Interrogation Session: 20230323191641
Implantable Lead Implant Date: 20140408
Implantable Lead Implant Date: 20140408
Implantable Lead Location: 753859
Implantable Lead Location: 753860
Implantable Lead Model: 1944
Implantable Lead Model: 1948
Implantable Pulse Generator Implant Date: 20140408
Lead Channel Impedance Value: 525 Ohm
Lead Channel Impedance Value: 650 Ohm
Lead Channel Pacing Threshold Amplitude: 0.375 V
Lead Channel Pacing Threshold Amplitude: 0.5 V
Lead Channel Pacing Threshold Pulse Width: 0.4 ms
Lead Channel Pacing Threshold Pulse Width: 0.4 ms
Lead Channel Sensing Intrinsic Amplitude: 12 mV
Lead Channel Sensing Intrinsic Amplitude: 4.3 mV
Lead Channel Setting Pacing Amplitude: 0.75 V
Lead Channel Setting Pacing Amplitude: 1.375
Lead Channel Setting Pacing Pulse Width: 0.4 ms
Lead Channel Setting Sensing Sensitivity: 2 mV
Pulse Gen Model: 2210
Pulse Gen Serial Number: 7457437

## 2021-12-28 NOTE — Patient Instructions (Signed)
Medication Instructions:  ° °Your physician recommends that you continue on your current medications as directed. Please refer to the Current Medication list given to you today. ° °*If you need a refill on your cardiac medications before your next appointment, please call your pharmacy* ° ° °Lab Work:  NONE ORDERED  TODAY ° ° °If you have labs (blood work) drawn today and your tests are completely normal, you will receive your results only by: °MyChart Message (if you have MyChart) OR °A paper copy in the mail °If you have any lab test that is abnormal or we need to change your treatment, we will call you to review the results. ° ° °Testing/Procedures: NONE ORDERED  TODAY ° ° ° ° °Follow-Up: °At [facility], you and your health needs are our priority.  As part of our continuing mission to provide you with exceptional heart care, we have created designated Provider Care Teams.  These Care Teams include your primary Cardiologist (physician) and Advanced Practice Providers (APPs -  Physician Assistants and Nurse Practitioners) who all work together to provide you with the care you need, when you need it. ° °We recommend signing up for the patient portal called "MyChart".  Sign up information is provided on this After Visit Summary.  MyChart is used to connect with patients for Virtual Visits (Telemedicine).  Patients are able to view lab/test results, encounter notes, upcoming appointments, etc.  Non-urgent messages can be sent to your provider as well.   °To learn more about what you can do with MyChart, go to [email or web address].   ° °Your next appointment:   °1 year(s) ° °The format for your next appointment:   °In Person ° °Provider:   °Cameron Lambert, MD  ° ° °Other Instructions ° °

## 2022-01-24 ENCOUNTER — Ambulatory Visit: Payer: BC Managed Care – PPO | Admitting: Adult Health

## 2022-02-05 ENCOUNTER — Encounter: Payer: Self-pay | Admitting: Nurse Practitioner

## 2022-02-05 ENCOUNTER — Ambulatory Visit (INDEPENDENT_AMBULATORY_CARE_PROVIDER_SITE_OTHER): Payer: BC Managed Care – PPO | Admitting: Nurse Practitioner

## 2022-02-05 VITALS — BP 126/70 | HR 84 | Temp 97.3°F | Wt 190.4 lb

## 2022-02-05 DIAGNOSIS — R350 Frequency of micturition: Secondary | ICD-10-CM

## 2022-02-05 DIAGNOSIS — M545 Low back pain, unspecified: Secondary | ICD-10-CM

## 2022-02-05 NOTE — Progress Notes (Signed)
Assessment and Plan: ? ?Alyssa Dudley was seen today for an episodic visit. ? ?Diagnoses and all order for this visit: ? ?1. Urinary frequency ?Continue to stay well hydrated. ?Consider cranberry supplement. ? ?- Urinalysis, Routine w reflex microscopic ? ?2. Acute right-sided low back pain without sciatica ?Continue to monitor for nephrolithiasis. ? ?- Urinalysis, Routine w reflex microscopic ? ?Continue to monitor for any increase in fever, chills, N/V, diarrhea, radiating pain.  Notify office for further evaluation and treatment, questions or concerns if s/s fail to improve. The risks and benefits of my recommendations, as well as other treatment options were discussed with the patient today. Questions were answered. ? ?Further disposition pending results of labs. Discussed med's effects and SE's.   ? ?Over 15 minutes of exam, counseling, chart review, and critical decision making was performed.  ? ?Future Appointments  ?Date Time Provider [place]  ?02/12/2022  7:05 AM [facility] DEVICE REMOTES [facility]  ?04/05/2022 10:00 AM Mull, [name], NP [facility] None  ?05/14/2022  7:05 AM [facility] DEVICE REMOTES [facility]  ?08/13/2022  7:05 AM [facility] DEVICE REMOTES [facility]  ?11/12/2022  7:05 AM [facility] DEVICE REMOTES [facility]  ?02/11/2023  7:05 AM [facility] DEVICE REMOTES [facility]  ?05/13/2023  7:05 AM [facility] DEVICE REMOTES [facility]  ? ? ?------------------------------------------------------------------------------------------------------------------ ? ? ?HPI ?BP 126/70   Pulse 84   Temp (!) 97.3 ?F (36.3 ?C)   Wt 190 lb 6.4 oz (86.4 kg)   SpO2 97%   BMI 30.73 kg/m?  ? ?Urinary Tract Infection ?Patient complains of right flank pain and frequency. She has had symptoms for 10 days. Patient also complains of back pain. Patient denies back pain. Patient does not have a history of recurrent UTI. Patient does  not have a history of pyelonephritis. She does have a hx of nephrolithiasis. She has taken some "left over" Amoxicillin that she had on hand.  This has not improved her symptoms. She denies any hematuria, fever, chills, N/V.   ? ? ? ?Past Medical History:  ?Diagnosis Date  ? Allergy   ? once a year   ? Hyperlipidemia   ? borderline- no meds   ? Pacemaker   ? Paroxysmal atrial fibrillation ([place]) 6/15  ? detected on ppm interrogation  ? PONV (postoperative nausea and vomiting)   ? has vomitted in past  ? Second degree Mobitz II AV block   ? Vitamin D deficiency   ?  ? ?No Known Allergies ? ?Current Outpatient Medications on File Prior to Visit  ?Medication Sig  ? aspirin 81 MG chewable tablet Chew by mouth daily.  ? atenolol (TENORMIN) 50 MG tablet Take  1 tablet  Daily  for BP  ? triamcinolone cream (KENALOG) 0.1 % APPLY EXTERNALLY TO THE AFFECTED AREA THREE TIMES DAILY AS NEEDED FOR RASH  ? Vitamin D, Ergocalciferol, (DRISDOL) 1.25 MG (50000 UNIT) CAPS capsule TAKE 1 CAPSULE BY MOUTH TWICE WEEKLY  ? ?Current Facility-Administered Medications on File Prior to Visit  ?Medication  ? 0.9 %  sodium chloride infusion  ? ? ?ROS: all negative except what is noted in the HPI.  ? ?Physical Exam: ? ?BP 126/70   Pulse 84   Temp (!) 97.3 ?F (36.3 ?C)   Wt 190 lb 6.4 oz (86.4 kg)   SpO2 97%   BMI 30.73 kg/m?  ? ?General Appearance: NAD.  Awake, conversant and cooperative. ?Eyes: PERRLA, EOMs intact.  Sclera white.  Conjunctiva without erythema. ?Sinuses: No frontal/maxillary tenderness.  No nasal discharge. Nares patent.  ?ENT/Mouth: Ext aud canals clear.  Bilateral TMs w/DOL and without erythema or bulging. Hearing intact.  Posterior pharynx without swelling or exudate.  Tonsils without swelling or erythema.  ?Neck: Supple.  No masses, nodules or thyromegaly. ?Respiratory: Effort is regular with non-labored breathing. Breath sounds are equal bilaterally without rales, rhonchi, wheezing or stridor.  ?Cardio: RRR with no  MRGs. Brisk peripheral pulses without edema.  ?Abdomen: Active BS in all four quadrants.  Soft and non-tender without guarding, rebound tenderness, hernias or masses. ?Lymphatics: Non tender without lymphadenopathy.  ?Musculoskeletal: Right Posterior CVA tenderness radiates to RUQ and RLQ.   ?Skin: Appropriate color for ethnicity. Warm without rashes, lesions, ecchymosis, ulcers.  ?Neuro: CN II-XII grossly normal. Normal muscle tone without cerebellar symptoms and intact sensation.   ?Psych: AO X 3,  appropriate mood and affect, insight and judgment.  ?  ? ?Darrol Jump, NP ?11:10 AM ?[facility] ? ?

## 2022-02-05 NOTE — Patient Instructions (Signed)
Asymptomatic Bacteriuria Asymptomatic bacteriuria is the presence of a large number of bacteria in the urine without the usual symptoms of burning or frequent urination. This is usually not harmful, and treatment may not be needed. A person with this condition will not be more likely to develop an infection in the future. What are the causes? This condition is caused by an increase in bacteria in the urine. This increase can be caused by: Bacteria entering the urinary tract, such as during sex. A blockage in the urinary tract, such as from kidney stones or a tumor. Bladder problems that prevent the bladder from emptying. What increases the risk? You are more likely to develop this condition if: You have diabetes. You are an older adult. This especially affects older adults in long-term care facilities. You are pregnant and in the first trimester. You have kidney stones. You are female. You have had a kidney transplant. You have a leaky kidney tube valve (reflux). You had a urinary catheter for a long period of time. This is a long, thin tube that collects urine. What are the signs or symptoms? There are no symptoms of this condition. How is this diagnosed? This condition is diagnosed with a urine test. Because this condition does not cause symptoms, it is usually diagnosed when a urine sample is taken to treat or diagnose another condition, such as pregnancy or kidney problems. Most women who are in their first trimester of pregnancy are screened for asymptomatic bacteriuria. How is this treated? Usually, treatment is not needed for this condition. Treating the condition can lead to other problems, such as a yeast infection or the growth of bacteria that do not respond to treatment (antibiotic-resistant bacteria). Some people do need treatment with antibiotic medicines to prevent kidney infection, known as pyelonephritis. Treatment is needed if: You are pregnant. In pregnant women, kidney  infection can lead to: Early labor (premature labor). Very low birth weight (fetal growth restriction). Newborn death. You are having a procedure that affects the urinary tract. You have had a kidney transplant. If you are diagnosed with this condition, talk with your health care provider about any concerns that you have. Follow these instructions at home: Medicines Take over-the-counter and prescription medicines only as told by your health care provider. If you were prescribed an antibiotic medicine, take it as told by your health care provider. Do not stop using the antibiotic even if you start to feel better. General instructions Monitor your condition for any changes. Drink enough fluid to keep your urine pale yellow. Urinate more often to keep your bladder empty. If you are female, keep the area around your vagina and rectum clean. Wipe from front to back after urinating or having a bowel movement. Use each piece of toilet paper only once. Keep all follow-up visits. This is important. Contact a health care provider if: You have symptoms of a urine infection, such as: A burning sensation, or pain when you urinate. A strong need to urinate, or urinating more often. Urine turning discolored or cloudy. Blood in your urine. Urine that smells bad. Get help right away if: You develop signs of a kidney infection, such as: Back pain or pelvic pain. A fever or chills. Nausea or vomiting. Severe pain that cannot be controlled with medicine. Summary Asymptomatic bacteriuria is the presence of a large number of bacteria in the urine without the usual symptoms of burning or frequent urination. Usually, treatment is not needed for this condition. Treating the condition can lead   to other problems, such as a yeast infection or the growth of bacteria that do not respond to treatment. Some people do need treatment. Treatment is needed if you are pregnant, if you are having a procedure that  affects the urinary tract, or if you have had a kidney transplant. If you were prescribed an antibiotic medicine, take it as told by your health care provider. Do not stop using the antibiotic even if you start to feel better. This information is not intended to replace advice given to you by your health care provider. Make sure you discuss any questions you have with your health care provider. Document Revised: 05/06/2020 Document Reviewed: 05/06/2020 Elsevier Patient Education  2023 [employer]  

## 2022-02-06 LAB — URINALYSIS, ROUTINE W REFLEX MICROSCOPIC
Bilirubin Urine: NEGATIVE
Glucose, UA: NEGATIVE
Hgb urine dipstick: NEGATIVE
Hyaline Cast: NONE SEEN /LPF
Ketones, ur: NEGATIVE
Leukocytes,Ua: NEGATIVE
Nitrite: NEGATIVE
RBC / HPF: NONE SEEN /HPF (ref 0–2)
Specific Gravity, Urine: 1.033 (ref 1.001–1.035)
WBC, UA: NONE SEEN /HPF (ref 0–5)
pH: 6 (ref 5.0–8.0)

## 2022-02-06 LAB — MICROSCOPIC MESSAGE

## 2022-02-07 ENCOUNTER — Other Ambulatory Visit: Payer: Self-pay | Admitting: Nurse Practitioner

## 2022-02-07 ENCOUNTER — Encounter: Payer: Self-pay | Admitting: Internal Medicine

## 2022-02-07 DIAGNOSIS — R109 Unspecified abdominal pain: Secondary | ICD-10-CM

## 2022-02-07 MED ORDER — NITROFURANTOIN MONOHYD MACRO 100 MG PO CAPS: 100.0000 mg | ORAL_CAPSULE | Freq: Two times a day (BID) | ORAL | 0 refills | Status: AC

## 2022-02-08 LAB — URINE CULTURE
MICRO NUMBER:: 13346844
Result:: NO GROWTH
SPECIMEN QUALITY:: ADEQUATE

## 2022-04-04 NOTE — Progress Notes (Unsigned)
Complete Physical  Assessment and Plan:  Encounter for routine adult health examination with abnormal findings 1 year Mammogram, DEXA, colonoscopy are all UTD  Mobitz type 2 second degree and high-grade heart block Pacemaker in place, continue follow up with Dr. [name] Last echo 01/2021 and was looking ok, followed by cardiology  Essential hypertension Continue medication Monitor blood pressure at home; call if consistently over 130/80 Continue DASH diet.   Reminder to go to the ER if any CP, SOB, nausea, dizziness, severe HA, changes vision/speech, left arm numbness and tingling and jaw pain. -UA, microalbumin/creatinine urine ratio   Paroxysmal atrial fibrillation (HCC) CHADSASC is now 2 since she is being treated for HTN- not on anticoag Followed by cardiology- last seen 12/2021 , still no a/c needed  Gastroesophageal reflux disease, esophagitis presence not specified Well managed on current medications; PRN OTC agents Discussed diet, avoiding triggers and other lifestyle changes  Benign neoplasm of colon, unspecified part of colon Colonoscopy 07/2021, next due 2027  Vitamin D deficiency Continue supplementation for goal of 70-100 Check vitamin D level  Overweight (BMI 25.0-29.9) Long discussion about weight loss, diet, and exercise Recommended diet heavy in fruits and veggies and low in animal meats, cheeses, and dairy products, appropriate calorie intake Patient will work on cutting down on sugary snacks - pt down 1 pound  Medication management CBC, CMP/GFR, UA, magnesium  Vasomotor symptoms due to menopause - Try estroven complete over the counter for hot flashes, vaginal dryness and decreased libido If symptoms are not improving notify the office Work on relaxation techniques  Pure hypercholesterolemia Currently at goal by lifestyle modification Continue low cholesterol diet and exercise.  Check lipid panel.   History of peptic ulcer disease Avoid  NSAIDS  Abnormal glucose Discussed diet/exercise, weight management  A1C  Osteoarthritis -Does exercises and Tylenol as needed  Screening for hematuria/proteinuria   Screening for ischemic heart disease - EKG    Discupt had sore throat for approx week but had salivary stone she removed and has resolvedssed med's effects and SE's. Screening labs and tests as requested with regular follow-up as recommended. Over 60 minutes of exam, counseling, chart review and critical decision making was performed  Future Appointments  Date Time Provider [place]  05/14/2022  7:05 AM [facility] DEVICE REMOTES [facility]  08/13/2022  7:05 AM [facility] DEVICE REMOTES [facility]  11/12/2022  7:05 AM [facility] DEVICE REMOTES [facility]  02/11/2023  7:05 AM [facility] DEVICE REMOTES [facility]  04/17/2023  9:00 AM [place], Kenney Houseman, NP [facility] None  05/13/2023  7:05 AM [facility] DEVICE REMOTES [facility]     HPI  This very nice 66 y.o.female presents for complete physical. has GERD; Constipation; History of peptic ulcer disease; Family history of malignant neoplasm of gastrointestinal tract; Benign neoplasm of colon; Mobitz type 2 second degree and high-grade heart block; Hyperlipidemia; Vitamin D deficiency; Atrial fibrillation ([place]); HTN (hypertension); Abnormal glucose; Medication management; Overweight (BMI 25.0-29.9); Meralgia paraesthetica, left; Plantar fasciitis of left foot; Lateral epicondylitis of left elbow; Osteoarthritis of multiple joints; Salivary stone; and History of renal calculi on their problem list.   She has Afib and mobitz type 2 with pacemaker placement in 2014, follows with Dr. [name] and Alyssa Dudley, low risk CHADSVASC 1 and not on anticoagulation. Patient reports no complaints at this time. Last EKG 01/2021 Stable.  Reflux symptoms are not bad currently and is controlled with behavior modifications  She  has noted persistent hot flashes, vaginal dryness and decreased libido.  Has  only been married x 2 years and husband is 7 years younger- is concerned.  She continues to have pain in right shoulder from arthritis, previously injected. She is not on any medication  BMI is Body mass index is 31.79 kg/m., she has been working on diet and exercise. Has cut out sweets, sweet drinks and chips Wt Readings from Last 3 Encounters:  04/05/22 185 lb 3.2 oz (84 kg)  02/05/22 190 lb 6.4 oz (86.4 kg)  12/28/21 187 lb 9.6 oz (85.1 kg)   Her blood pressure has been controlled at home, currently on Atenolol 50 mg daily, today their BP is BP: 120/82,  BP Readings from Last 3 Encounters:  04/05/22 120/82  02/05/22 126/70  12/28/21 138/88   She does workout. She denies chest pain, shortness of breath, dizziness.    She is not on cholesterol medication, she tried zetia but states she had diarrhea with taking only 3 x a week. . Her cholesterol is not at goal. The cholesterol last visit was:   Lab Results  Component Value Date   CHOL 175 04/05/2021   HDL 50 04/05/2021   LDLCALC 111 (H) 04/05/2021   TRIG 57 04/05/2021   CHOLHDL 3.5 04/05/2021    She has been working on diet and exercise for glucose management, and denies foot ulcerations, increased appetite, nausea, paresthesia of the feet, polydipsia, polyuria, visual disturbances and vomiting. Last A1C in the office was:  Lab Results  Component Value Date   HGBA1C 5.4 04/05/2021   Patient is on Vitamin D supplement, 1 capsule twice a week Lab Results  Component Value Date   VD25OH 115 (H) 04/05/2021       Current Medications:  Current Outpatient Medications on File Prior to Visit  Medication Sig Dispense Refill   aspirin 81 MG chewable tablet Chew by mouth daily.     atenolol (TENORMIN) 50 MG tablet Take  1 tablet  Daily  for BP 90 tablet 3   triamcinolone cream (KENALOG) 0.1 % APPLY EXTERNALLY TO THE AFFECTED AREA THREE TIMES DAILY AS NEEDED  FOR RASH 30 g 3   Vitamin D, Ergocalciferol, (DRISDOL) 1.25 MG (50000 UNIT) CAPS capsule TAKE 1 CAPSULE BY MOUTH TWICE WEEKLY 180 capsule 0   Current Facility-Administered Medications on File Prior to Visit  Medication Dose Route Frequency Provider Last Rate Last Admin   0.9 %  sodium chloride infusion  500 mL Intravenous Once Pyrtle, Lajuan Lines, MD       Health Maintenance:   Immunization History  Administered Date(s) Administered   Influenza Inj Mdck Quad With Preservative 07/26/2017, 08/11/2018, 07/13/2019, 07/25/2020   Influenza Split 06/30/2013, 07/20/2014, 07/21/2015   Influenza,inj,Quad PF,6+ Mos 08/04/2021   Influenza,inj,quad, With Preservative 07/05/2016   PFIZER(Purple Top)SARS-COV-2 Vaccination 12/05/2019, 12/26/2019, 07/23/2020   PPD Test 02/22/2014, 05/10/2015   Tdap 10/09/2011   Zoster Recombinat (Shingrix) 01/09/2017, 02/06/2018, 02/06/2018   Colonoscopy:07/2021 due 2027 Mammo: 09/2021 BMD: 2015 WNL will wait until closer to 66 Pap/ Pelvic: 2021WNL - HPV neg  EKG 12/28/20 Echo 01/2021  EYE: Dr. Peter Garter, last 2021, wears glasses Dentist: Dr. Glynn Octave, Q 62month  Allergies: No Known Allergies Medical History:  has GERD; Constipation; History of peptic ulcer disease; Family history of malignant neoplasm of gastrointestinal tract; Benign neoplasm of colon; Mobitz type 2 second degree and high-grade heart block; Hyperlipidemia; Vitamin D deficiency; Atrial fibrillation ([place]; HTN (hypertension); Abnormal glucose; Medication management; Overweight (BMI 25.0-29.9); Meralgia paraesthetica, left; Plantar fasciitis of left foot; Lateral epicondylitis of left elbow;  Osteoarthritis of multiple joints; Salivary stone; and History of renal calculi on their problem list. Surgical History:  She  has a past surgical history that includes Breast cyst excision (1980); Wrist surgery (Right, 2011); permanent pacemaker insertion (N/A, 01/13/2013); Colonoscopy; Polypectomy; Plantar fasciectomy; and  Extracorporeal shock wave lithotripsy (Right, 12/28/2019). Family History:  Her family history includes Anemia in her sister; Cervical cancer in her mother; Colon cancer (age of onset: 24) in her sister; Diabetes in her mother; HIV/AIDS in her brother; Heart disease in her father; Hyperlipidemia in her father; Hypertension in her mother and son; Kidney disease in her son; Leukemia in her sister; Multiple myeloma in her maternal aunt; Multiple myeloma (age of onset: 18) in her son; Prostate cancer (age of onset: 74) in her brother. Social History:   reports that she has never smoked. She has never used smokeless tobacco. She reports that she does not drink alcohol and does not use drugs.  Review of Systems: Review of Systems  Constitutional:  Negative for chills, fever, malaise/fatigue and weight loss.  HENT:  Negative for congestion, ear discharge, hearing loss, sore throat (pt had sore throat for approx week but had salivary stone she removed and has resolved) and tinnitus.   Eyes:  Negative for blurred vision, double vision, discharge and redness.  Respiratory:  Negative for cough, sputum production, shortness of breath and wheezing.   Cardiovascular:  Negative for chest pain, palpitations, orthopnea, claudication, leg swelling and PND.  Gastrointestinal:  Negative for abdominal pain, blood in stool, constipation, heartburn, melena, nausea and vomiting.  Genitourinary:  Negative for dysuria, frequency and urgency.       Decreased libido, vaginal dryness  Musculoskeletal:  Positive for joint pain (right shoulder). Negative for back pain, falls and myalgias.  Skin:  Negative for rash.  Neurological:  Negative for dizziness, tingling, sensory change, seizures, weakness and headaches.  Endo/Heme/Allergies:  Negative for polydipsia. Does not bruise/bleed easily.  Psychiatric/Behavioral: Negative.  Negative for depression, hallucinations, memory loss, substance abuse and suicidal ideas. The patient is  not nervous/anxious and does not have insomnia.   All other systems reviewed and are negative.   Physical Exam: Estimated body mass index is 31.79 kg/m as calculated from the following:   Height as of this encounter: '5\' 4"'  (1.626 m).   Weight as of this encounter: 185 lb 3.2 oz (84 kg). BP 120/82   Pulse 73   Temp (!) 97.2 F (36.2 C)   Ht '5\' 4"'  (1.626 m)   Wt 185 lb 3.2 oz (84 kg)   SpO2 99%   BMI 31.79 kg/m  General Appearance: Well nourished, in no apparent distress.  Eyes: PERRLA, EOMs, conjunctiva no swelling or erythema, normal fundi and vessels.  Sinuses: No Frontal/maxillary tenderness  ENT/Mouth: Ext aud canals clear, normal light reflex with TMs without erythema, bulging. Good dentition. No erythema, swelling, or exudate on post pharynx. Tonsils not swollen or erythematous. Hearing normal.  Neck: Supple, thyroid normal. No bruits  Respiratory: Respiratory effort normal, BS equal bilaterally without rales, rhonchi, wheezing or stridor.  Cardio: RRR without murmurs, rubs or gallops. Brisk peripheral pulses without edema.  Chest: symmetric, with normal excursions and percussion.  Breasts: Symmetric, without lumps, nipple discharge, retractions.  Abdomen: Soft, nontender, no guarding, rebound, hernias, masses, or organomegaly.  Lymphatics: Non tender without lymphadenopathy.  Genitourinary: deferred Musculoskeletal: Full ROM all peripheral extremities,5/5 strength, and normal gait, neg straight leg. Full ROM left hip. + pin point left SI tenderness to palpation.  Skin:  Warm, dry without rashes, lesions, ecchymosis. Neuro: Cranial nerves intact, reflexes equal bilaterally. Normal muscle tone, no cerebellar symptoms. Sensation intact.  Psych: Awake and oriented X 3, normal affect, Insight and Judgment appropriate.   EKG: NSR, pacemaker  Alyssa Dudley E  9:13 AM [facility]

## 2022-04-05 ENCOUNTER — Ambulatory Visit (INDEPENDENT_AMBULATORY_CARE_PROVIDER_SITE_OTHER): Payer: BC Managed Care – PPO | Admitting: Nurse Practitioner

## 2022-04-05 ENCOUNTER — Encounter: Payer: Self-pay | Admitting: Nurse Practitioner

## 2022-04-05 VITALS — BP 120/82 | HR 73 | Temp 97.2°F | Ht 64.0 in | Wt 185.2 lb

## 2022-04-05 DIAGNOSIS — I441 Atrioventricular block, second degree: Secondary | ICD-10-CM

## 2022-04-05 DIAGNOSIS — E559 Vitamin D deficiency, unspecified: Secondary | ICD-10-CM | POA: Diagnosis not present

## 2022-04-05 DIAGNOSIS — I10 Essential (primary) hypertension: Secondary | ICD-10-CM | POA: Diagnosis not present

## 2022-04-05 DIAGNOSIS — Z131 Encounter for screening for diabetes mellitus: Secondary | ICD-10-CM | POA: Diagnosis not present

## 2022-04-05 DIAGNOSIS — K219 Gastro-esophageal reflux disease without esophagitis: Secondary | ICD-10-CM

## 2022-04-05 DIAGNOSIS — Z136 Encounter for screening for cardiovascular disorders: Secondary | ICD-10-CM

## 2022-04-05 DIAGNOSIS — Z0000 Encounter for general adult medical examination without abnormal findings: Secondary | ICD-10-CM | POA: Diagnosis not present

## 2022-04-05 DIAGNOSIS — Z13220 Encounter for screening for lipoid disorders: Secondary | ICD-10-CM

## 2022-04-05 DIAGNOSIS — Z8711 Personal history of peptic ulcer disease: Secondary | ICD-10-CM

## 2022-04-05 DIAGNOSIS — Z0001 Encounter for general adult medical examination with abnormal findings: Secondary | ICD-10-CM

## 2022-04-05 DIAGNOSIS — Z79899 Other long term (current) drug therapy: Secondary | ICD-10-CM

## 2022-04-05 DIAGNOSIS — M159 Polyosteoarthritis, unspecified: Secondary | ICD-10-CM

## 2022-04-05 DIAGNOSIS — E663 Overweight: Secondary | ICD-10-CM

## 2022-04-05 DIAGNOSIS — E7800 Pure hypercholesterolemia, unspecified: Secondary | ICD-10-CM

## 2022-04-05 DIAGNOSIS — Z1389 Encounter for screening for other disorder: Secondary | ICD-10-CM | POA: Diagnosis not present

## 2022-04-05 DIAGNOSIS — N951 Menopausal and female climacteric states: Secondary | ICD-10-CM

## 2022-04-05 DIAGNOSIS — R7309 Other abnormal glucose: Secondary | ICD-10-CM

## 2022-04-05 DIAGNOSIS — I480 Paroxysmal atrial fibrillation: Secondary | ICD-10-CM

## 2022-04-05 DIAGNOSIS — D126 Benign neoplasm of colon, unspecified: Secondary | ICD-10-CM

## 2022-04-05 NOTE — Patient Instructions (Signed)
Estroven Complete- use daily   GENERAL HEALTH GOALS   Know what a healthy weight is for you (roughly BMI <25) and aim to maintain this   Aim for 7+ servings of fruits and vegetables daily   70-80+ fluid ounces of water or unsweet tea for healthy kidneys   Limit to max 1 drink of alcohol per day; avoid smoking/tobacco   Limit animal fats in diet for cholesterol and heart health - choose grass fed whenever available   Avoid highly processed foods, and foods high in saturated/trans fats   Aim for low stress - take time to unwind and care for your mental health   Aim for 150 min of moderate intensity exercise weekly for heart health, and weights twice weekly for bone health   Aim for 7-9 hours of sleep daily

## 2022-04-06 ENCOUNTER — Other Ambulatory Visit: Payer: Self-pay | Admitting: Nurse Practitioner

## 2022-04-06 ENCOUNTER — Encounter: Payer: Self-pay | Admitting: Nurse Practitioner

## 2022-04-06 DIAGNOSIS — E8809 Other disorders of plasma-protein metabolism, not elsewhere classified: Secondary | ICD-10-CM

## 2022-04-06 DIAGNOSIS — R710 Precipitous drop in hematocrit: Secondary | ICD-10-CM

## 2022-04-06 LAB — MICROSCOPIC MESSAGE

## 2022-04-06 LAB — COMPLETE METABOLIC PANEL WITH GFR
AG Ratio: 0.5 (calc) — ABNORMAL LOW (ref 1.0–2.5)
ALT: 20 U/L (ref 6–29)
AST: 23 U/L (ref 10–35)
Albumin: 3.6 g/dL (ref 3.6–5.1)
Alkaline phosphatase (APISO): 69 U/L (ref 37–153)
BUN: 14 mg/dL (ref 7–25)
CO2: 25 mmol/L (ref 20–32)
Calcium: 8.8 mg/dL (ref 8.6–10.4)
Chloride: 109 mmol/L (ref 98–110)
Creat: 0.77 mg/dL (ref 0.50–1.05)
Globulin: 7.8 g/dL (calc) — ABNORMAL HIGH (ref 1.9–3.7)
Glucose, Bld: 85 mg/dL (ref 65–99)
Potassium: 4.7 mmol/L (ref 3.5–5.3)
Sodium: 138 mmol/L (ref 135–146)
Total Bilirubin: 0.5 mg/dL (ref 0.2–1.2)
Total Protein: 11.4 g/dL — ABNORMAL HIGH (ref 6.1–8.1)
eGFR: 86 mL/min/{1.73_m2} (ref >=60)

## 2022-04-06 LAB — MICROALBUMIN / CREATININE URINE RATIO
Creatinine, Urine: 151 mg/dL (ref 20–275)
Microalb Creat Ratio: 181 mcg/mg creat — ABNORMAL HIGH (ref <30)
Microalb, Ur: 27.4 mg/dL

## 2022-04-06 LAB — CBC WITH DIFFERENTIAL/PLATELET
Absolute Monocytes: 818 cells/uL (ref 200–950)
Basophils Absolute: 33 cells/uL (ref 0–200)
Basophils Relative: 0.5 %
Eosinophils Absolute: 79 cells/uL (ref 15–500)
Eosinophils Relative: 1.2 %
HCT: 31.7 % — ABNORMAL LOW (ref 35.0–45.0)
Hemoglobin: 11 g/dL — ABNORMAL LOW (ref 11.7–15.5)
Lymphs Abs: 3749 cells/uL (ref 850–3900)
MCH: 33.2 pg — ABNORMAL HIGH (ref 27.0–33.0)
MCHC: 34.7 g/dL (ref 32.0–36.0)
MCV: 95.8 fL (ref 80.0–100.0)
MPV: 9.5 fL (ref 7.5–12.5)
Monocytes Relative: 12.4 %
Neutro Abs: 1921 cells/uL (ref 1500–7800)
Neutrophils Relative %: 29.1 %
Platelets: 170 10*3/uL (ref 140–400)
RBC: 3.31 10*6/uL — ABNORMAL LOW (ref 3.80–5.10)
RDW: 14.7 % (ref 11.0–15.0)
Total Lymphocyte: 56.8 %
WBC: 6.6 10*3/uL (ref 3.8–10.8)

## 2022-04-06 LAB — URINALYSIS, ROUTINE W REFLEX MICROSCOPIC
Bilirubin Urine: NEGATIVE
Glucose, UA: NEGATIVE
Ketones, ur: NEGATIVE
Leukocytes,Ua: NEGATIVE
Nitrite: NEGATIVE
RBC / HPF: NONE SEEN /HPF (ref 0–2)
Specific Gravity, Urine: 1.032 (ref 1.001–1.035)
WBC, UA: NONE SEEN /HPF (ref 0–5)
pH: 5.5 (ref 5.0–8.0)

## 2022-04-06 LAB — VITAMIN D 25 HYDROXY (VIT D DEFICIENCY, FRACTURES): Vit D, 25-Hydroxy: >150 ng/mL — ABNORMAL HIGH (ref 30–100)

## 2022-04-06 LAB — LIPID PANEL
Cholesterol: 134 mg/dL (ref <200)
HDL: 35 mg/dL — ABNORMAL LOW (ref >=50)
LDL Cholesterol (Calc): 85 mg/dL (calc)
Non-HDL Cholesterol (Calc): 99 mg/dL (calc) (ref <130)
Total CHOL/HDL Ratio: 3.8 (calc) (ref <5.0)
Triglycerides: 49 mg/dL (ref <150)

## 2022-04-06 LAB — TSH: TSH: 2.31 mIU/L (ref 0.40–4.50)

## 2022-04-06 LAB — MAGNESIUM: Magnesium: 2 mg/dL (ref 1.5–2.5)

## 2022-04-06 LAB — HEMOGLOBIN A1C
Hgb A1c MFr Bld: 5.6 % of total Hgb (ref <5.7)
Mean Plasma Glucose: 114 mg/dL
eAG (mmol/L): 6.3 mmol/L

## 2022-04-19 ENCOUNTER — Ambulatory Visit (INDEPENDENT_AMBULATORY_CARE_PROVIDER_SITE_OTHER): Payer: BC Managed Care – PPO

## 2022-04-19 VITALS — BP 118/70 | HR 81 | Temp 97.9°F | Resp 16 | Ht 64.0 in | Wt 186.4 lb

## 2022-04-19 DIAGNOSIS — E8809 Other disorders of plasma-protein metabolism, not elsewhere classified: Secondary | ICD-10-CM | POA: Diagnosis not present

## 2022-04-19 DIAGNOSIS — R710 Precipitous drop in hematocrit: Secondary | ICD-10-CM | POA: Diagnosis not present

## 2022-04-19 NOTE — Progress Notes (Signed)
The patient came in for repeat labs today and reported no new issues or concerns.

## 2022-04-20 ENCOUNTER — Other Ambulatory Visit: Payer: Self-pay | Admitting: Nurse Practitioner

## 2022-04-20 ENCOUNTER — Other Ambulatory Visit: Payer: Self-pay

## 2022-04-20 DIAGNOSIS — Z1211 Encounter for screening for malignant neoplasm of colon: Secondary | ICD-10-CM | POA: Diagnosis not present

## 2022-04-20 DIAGNOSIS — R710 Precipitous drop in hematocrit: Secondary | ICD-10-CM

## 2022-04-20 DIAGNOSIS — Z1212 Encounter for screening for malignant neoplasm of rectum: Secondary | ICD-10-CM | POA: Diagnosis not present

## 2022-04-20 LAB — POC HEMOCCULT BLD/STL (HOME/3-CARD/SCREEN)
Card #2 Fecal Occult Blod, POC: NEGATIVE
Card #3 Fecal Occult Blood, POC: NEGATIVE
Fecal Occult Blood, POC: NEGATIVE

## 2022-04-20 LAB — CBC WITH DIFFERENTIAL/PLATELET
Absolute Monocytes: 1153 cells/uL — ABNORMAL HIGH (ref 200–950)
Basophils Absolute: 32 cells/uL (ref 0–200)
Basophils Relative: 0.5 %
Eosinophils Absolute: 57 cells/uL (ref 15–500)
Eosinophils Relative: 0.9 %
HCT: 30.3 % — ABNORMAL LOW (ref 35.0–45.0)
Hemoglobin: 10.1 g/dL — ABNORMAL LOW (ref 11.7–15.5)
Lymphs Abs: 3119 cells/uL (ref 850–3900)
MCH: 32.7 pg (ref 27.0–33.0)
MCHC: 33.3 g/dL (ref 32.0–36.0)
MCV: 98.1 fL (ref 80.0–100.0)
MPV: 9.7 fL (ref 7.5–12.5)
Monocytes Relative: 18.3 %
Neutro Abs: 1940 cells/uL (ref 1500–7800)
Neutrophils Relative %: 30.8 %
Platelets: 158 10*3/uL (ref 140–400)
RBC: 3.09 10*6/uL — ABNORMAL LOW (ref 3.80–5.10)
RDW: 15.1 % — ABNORMAL HIGH (ref 11.0–15.0)
Total Lymphocyte: 49.5 %
WBC: 6.3 10*3/uL (ref 3.8–10.8)

## 2022-04-20 LAB — FERRITIN: Ferritin: 357 ng/mL — ABNORMAL HIGH (ref 16–288)

## 2022-04-20 LAB — IRON, TOTAL/TOTAL IRON BINDING CAP
%SAT: 43 % (calc) (ref 16–45)
Iron: 126 ug/dL (ref 45–160)
TIBC: 292 mcg/dL (calc) (ref 250–450)

## 2022-04-23 ENCOUNTER — Other Ambulatory Visit: Payer: BC Managed Care – PPO

## 2022-04-23 ENCOUNTER — Other Ambulatory Visit: Payer: Self-pay

## 2022-04-23 ENCOUNTER — Telehealth: Payer: Self-pay | Admitting: Hematology

## 2022-04-23 ENCOUNTER — Other Ambulatory Visit: Payer: Self-pay | Admitting: Nurse Practitioner

## 2022-04-23 DIAGNOSIS — R710 Precipitous drop in hematocrit: Secondary | ICD-10-CM | POA: Diagnosis not present

## 2022-04-23 DIAGNOSIS — R5382 Chronic fatigue, unspecified: Secondary | ICD-10-CM

## 2022-04-23 DIAGNOSIS — R778 Other specified abnormalities of plasma proteins: Secondary | ICD-10-CM

## 2022-04-23 LAB — PROTEIN,TOTAL AND PROTEIN ELECTROPHORESIS, RANDOM URINE(REFL)
Abnormal Protein Band: 841 mg/dL — ABNORMAL HIGH
Creatinine, Urine: 95 mg/dL (ref 20–275)
Protein/Creat Ratio: 9726 mg/g creat — ABNORMAL HIGH (ref 24–184)
Protein/Creatinine Ratio: 9.726 mg/mg creat — ABNORMAL HIGH (ref 0.024–0.184)
Total Protein, Urine: 924 mg/dL — ABNORMAL HIGH (ref 5–24)

## 2022-04-23 NOTE — Telephone Encounter (Signed)
Scheduled appt per 7/17 referral. Pt is aware of appt date and time. Pt is aware to arrive 15 mins prior to appt time and to bring and updated insurance card. Pt is aware of appt location.

## 2022-04-24 ENCOUNTER — Encounter: Payer: Self-pay | Admitting: Hematology

## 2022-04-24 ENCOUNTER — Inpatient Hospital Stay: Payer: BC Managed Care – PPO | Attending: Hematology | Admitting: Hematology

## 2022-04-24 ENCOUNTER — Other Ambulatory Visit: Payer: Self-pay

## 2022-04-24 ENCOUNTER — Inpatient Hospital Stay: Payer: BC Managed Care – PPO

## 2022-04-24 VITALS — BP 149/70 | HR 72 | Temp 98.0°F | Resp 16 | Ht 64.0 in | Wt 185.5 lb

## 2022-04-24 DIAGNOSIS — Z79899 Other long term (current) drug therapy: Secondary | ICD-10-CM | POA: Diagnosis not present

## 2022-04-24 DIAGNOSIS — D649 Anemia, unspecified: Secondary | ICD-10-CM | POA: Insufficient documentation

## 2022-04-24 DIAGNOSIS — C9000 Multiple myeloma not having achieved remission: Secondary | ICD-10-CM | POA: Insufficient documentation

## 2022-04-24 DIAGNOSIS — R809 Proteinuria, unspecified: Secondary | ICD-10-CM | POA: Insufficient documentation

## 2022-04-24 LAB — TECHNOLOGIST SMEAR REVIEW: Plt Morphology: NORMAL

## 2022-04-24 LAB — LACTATE DEHYDROGENASE: LDH: 245 U/L — ABNORMAL HIGH (ref 98–192)

## 2022-04-24 LAB — CBC WITH DIFFERENTIAL/PLATELET
Absolute Monocytes: 1258 cells/uL — ABNORMAL HIGH (ref 200–950)
Basophils Absolute: 53 cells/uL (ref 0–200)
Basophils Relative: 0.6 %
Eosinophils Absolute: 70 cells/uL (ref 15–500)
Eosinophils Relative: 0.8 %
HCT: 33.7 % — ABNORMAL LOW (ref 35.0–45.0)
Hemoglobin: 11.3 g/dL — ABNORMAL LOW (ref 11.7–15.5)
Lymphs Abs: 4805 cells/uL — ABNORMAL HIGH (ref 850–3900)
MCH: 33.3 pg — ABNORMAL HIGH (ref 27.0–33.0)
MCHC: 33.5 g/dL (ref 32.0–36.0)
MCV: 99.4 fL (ref 80.0–100.0)
MPV: 9.6 fL (ref 7.5–12.5)
Monocytes Relative: 14.3 %
Neutro Abs: 2614 cells/uL (ref 1500–7800)
Neutrophils Relative %: 29.7 %
Platelets: 170 10*3/uL (ref 140–400)
RBC: 3.39 10*6/uL — ABNORMAL LOW (ref 3.80–5.10)
RDW: 14.9 % (ref 11.0–15.0)
Total Lymphocyte: 54.6 %
WBC: 8.8 10*3/uL (ref 3.8–10.8)

## 2022-04-24 LAB — CMP (CANCER CENTER ONLY)
ALT: 22 U/L (ref 0–44)
AST: 25 U/L (ref 15–41)
Albumin: 3.6 g/dL (ref 3.5–5.0)
Alkaline Phosphatase: 73 U/L (ref 38–126)
Anion gap: 1 — ABNORMAL LOW (ref 5–15)
BUN: 11 mg/dL (ref 8–23)
CO2: 28 mmol/L (ref 22–32)
Calcium: 9.4 mg/dL (ref 8.9–10.3)
Chloride: 107 mmol/L (ref 98–111)
Creatinine: 0.8 mg/dL (ref 0.44–1.00)
GFR, Estimated: >60 mL/min (ref >60)
Glucose, Bld: 90 mg/dL (ref 70–99)
Potassium: 4.2 mmol/L (ref 3.5–5.1)
Sodium: 136 mmol/L (ref 135–145)
Total Bilirubin: 0.5 mg/dL (ref 0.3–1.2)
Total Protein: 11.1 g/dL — ABNORMAL HIGH (ref 6.5–8.1)

## 2022-04-24 LAB — B12 AND FOLATE PANEL
Folate: 10.5 ng/mL
Vitamin B-12: 1735 pg/mL — ABNORMAL HIGH (ref 200–1100)

## 2022-04-24 LAB — RETICULOCYTES
ABS Retic: 57630 cells/uL (ref 20000–80000)
Retic Ct Pct: 1.7 %

## 2022-04-24 NOTE — Progress Notes (Signed)
[place]   [phone] Fax:([phone]) [place] Note   Patient Care Team: Unk Pinto, MD as PCP - General (Internal Medicine) Sable Feil, MD as Consulting Physician (Gastroenterology) Camillo Flaming, [place] as Referring Physician (Optometry) Fay Records, MD as Consulting Physician (Cardiology) Daryll Brod, MD as Consulting Physician (Orthopedic Surgery)  Date of Service:  04/24/2022   CHIEF COMPLAINTS/PURPOSE OF CONSULTATION:  Proteinuria, Anemia  REFERRING PHYSICIAN:  Lorenda Peck, NP  ASSESSMENT & PLAN:  Alyssa Dudley is a 66 y.o. postmenopausal female with  1. Proteinuria and hyperglobulinemia, ruled out to medical normal -she reports "sudsy" urine in the last few months. UA on 02/05/22 and on 04/05/22 showed 2+ protein. CMP from 04/05/22 showed significantly elevated total protein 11.4 and globulin 7.8. Total urine protein obtained 04/19/22 was 924, with abnormal protein band at 841. -She also has newly developed anemia, this is concerning for multiple myeloma. -I reviewed the results with the patient and her family today. I recommend obtaining additional labs today, consisting of SPEP with immunofixation, light chain level, immunoglobulin level, and a 24hr urine for UPEP and immunofixation. -If she has monoclonal protein in the blood or urine, will obtain bone marrow biopsy, bone survey or PET scan.  2.  Normocytic hypoproductive anemia -routine CBC with her PCP on 04/05/22 showed hgb 11, it ws normal last year. Repeat on 04/19/22 showed further drop to 10.1, with iron 126 and ferritin 357.  No lab evidence of iron deficiency or B12 deficiency.  Reviewed count from yesterday was normal, indicating hypoproductive anemia -most recent colonoscopy from 07/28/21 was benign, recall due 2027. POC cards obtained 04/20/22 were all negative.  -she reports she began taking oral iron and vit B12. CBC was repeated yesterday, 04/23/22, and hgb  rose to 11.3. -I advised her to switch to prenatal vitamin. -We will check a comprehensive heavy metal.  I suspect her anemia is probably related to her bone marrow disease, probably multiple myeloma.  She will likely need a bone marrow biopsy.   PLAN:  -lab today -will obtain 24hr urine for UPEP with immunofixation -If she has elevated light chain level, goal positive M protein in serum or urine, will obtain a bone marrow biopsy, and possible PET scan  -will call her with lab results    HISTORY OF PRESENTING ILLNESS:  Alyssa Dudley 66 y.o. female is a here because of proteinuria and anemia. The patient was referred by Lorenda Peck, NP. The patient presents to the clinic today accompanied by her husband and daughter.   Today the patient notes they felt/feeling prior/after... -she denies any new body pains, GI disturbances, SOB, weight changes, or other concerns. -she denies black stool, except after trying oral iron.  She has a PMHx of.... -elevated BP, began after death of her son -A.fib, s/p pacemarker placement 01/2013 -h/o lithotripsy 12/2019  Socially... -she works as a Photographer -she is married, with one living daughter. Her son died from MM. -aside from her son, she reports cervical cancer in her mother, colon cancer in a sister, prostate cancer in a brother, and MM in a maternal aunt. -never smoker, does not drink alcohol   REVIEW OF SYSTEMS:    Constitutional: Denies fevers, chills or abnormal night sweats Eyes: Denies blurriness of vision, double vision or watery eyes Ears, nose, mouth, throat, and face: Denies mucositis or sore throat Respiratory: Denies cough, dyspnea or wheezes Cardiovascular: Denies palpitation, chest discomfort or lower extremity swelling Gastrointestinal:  Denies nausea, heartburn or change in bowel habits Skin: Denies abnormal skin rashes Lymphatics: Denies new lymphadenopathy or easy bruising Neurological:Denies numbness, tingling  or new weaknesses Behavioral/Psych: Mood is stable, no new changes  All other systems were reviewed with the patient and are negative.   MEDICAL HISTORY:  Past Medical History:  Diagnosis Date   Allergy    once a year    Hyperlipidemia    borderline- no meds    Pacemaker    Paroxysmal atrial fibrillation ([place]) 6/15   detected on ppm interrogation   PONV (postoperative nausea and vomiting)    has vomitted in past   Second degree Mobitz II AV block    Vitamin D deficiency     SURGICAL HISTORY: Past Surgical History:  Procedure Laterality Date   BREAST CYST EXCISION  1980   COLONOSCOPY     EXTRACORPOREAL SHOCK WAVE LITHOTRIPSY Right 12/28/2019   Procedure: RIGHT EXTRACORPOREAL SHOCK WAVE LITHOTRIPSY (ESWL);  Surgeon: Raynelle Bring, MD;  Location: [facility];  Service: Urology;  Laterality: Right;   PERMANENT PACEMAKER INSERTION N/A 01/13/2013   SJM Accent DR RF implanted by Dr [name] for mobitz II AV block   PLANTAR FASCIECTOMY     POLYPECTOMY     WRIST SURGERY Right 2011   removed cyst    SOCIAL HISTORY: Social History   Socioeconomic History   Marital status: Married    Spouse name: Not on file   Number of children: 1   Years of education: Not on file   Highest education level: Not on file  Occupational History   Not on file  Tobacco Use   Smoking status: Never   Smokeless tobacco: Never  Vaping Use   Vaping Use: Never used  Substance and Sexual Activity   Alcohol use: No   Drug use: No   Sexual activity: Not Currently  Other Topics Concern   Not on file  Social History Narrative   Not on file   Social Determinants of Health   Financial Resource Strain: Not on file  Food Insecurity: Not on file  Transportation Needs: Not on file  Physical Activity: Sufficiently Active (03/04/2018)   Exercise Vital Sign    Days of Exercise per Week: 4 days    Minutes of Exercise per Session: 40 min  Stress: Stress Concern Present (03/04/2018)   Plain    Feeling of Stress : To some extent  Social Connections: Not on file  Intimate Partner Violence: Not on file    FAMILY HISTORY: Family History  Problem Relation Age of Onset   Cervical cancer Mother    Hypertension Mother    Diabetes Mother    Heart disease Father    Hyperlipidemia Father    Colon cancer Sister 47   Anemia Sister    Leukemia Sister    Prostate cancer Brother 21   HIV/AIDS Brother    Multiple myeloma Son 30   Hypertension Son    Kidney disease Son    Multiple myeloma Maternal Aunt    Colon polyps Neg Hx    Rectal cancer Neg Hx    Stomach cancer Neg Hx    Liver disease Neg Hx    Esophageal cancer Neg Hx     ALLERGIES:  has No Known Allergies.  MEDICATIONS:  Current Outpatient Medications  Medication Sig Dispense Refill   aspirin 81 MG chewable tablet Chew by mouth daily.     atenolol (TENORMIN)  50 MG tablet Take  1 tablet  Daily  for BP 90 tablet 3   triamcinolone cream (KENALOG) 0.1 % APPLY EXTERNALLY TO THE AFFECTED AREA THREE TIMES DAILY AS NEEDED FOR RASH 30 g 3   Vitamin D, Ergocalciferol, (DRISDOL) 1.25 MG (50000 UNIT) CAPS capsule TAKE 1 CAPSULE BY MOUTH TWICE WEEKLY 180 capsule 0   Current Facility-Administered Medications  Medication Dose Route Frequency Provider Last Rate Last Admin   0.9 %  sodium chloride infusion  500 mL Intravenous Once Pyrtle, Lajuan Lines, MD        PHYSICAL EXAMINATION: ECOG PERFORMANCE STATUS: 1 - Symptomatic but completely ambulatory  Vitals:   04/24/22 0910  BP: (!) 149/70  Pulse: 72  Resp: 16  Temp: 98 F (36.7 C)  SpO2: 100%   Filed Weights   04/24/22 0910  Weight: 185 lb 8 oz (84.1 kg)    GENERAL:alert, no distress and comfortable SKIN: skin color, texture, turgor are normal, no rashes or significant lesions EYES: normal, Conjunctiva are pink and non-injected, sclera clear  NECK: supple, thyroid normal size, non-tender, without  nodularity LYMPH:  no palpable lymphadenopathy in the cervical, axillary LUNGS: clear to auscultation and percussion with normal breathing effort HEART: regular rate & rhythm and no murmurs and no lower extremity edema ABDOMEN:abdomen soft, non-tender and normal bowel sounds Musculoskeletal:no cyanosis of digits and no clubbing  NEURO: alert & oriented x 3 with fluent speech, no focal motor/sensory deficits  LABORATORY DATA:  I have reviewed the data as listed    Latest Ref Rng & Units 04/23/2022    9:11 AM 04/19/2022    8:55 AM 04/05/2022    9:46 AM  CBC  WBC 3.8 - 10.8 Thousand/uL 8.8  6.3  6.6   Hemoglobin 11.7 - 15.5 g/dL 11.3  10.1  11.0   Hematocrit 35.0 - 45.0 % 33.7  30.3  31.7   Platelets 140 - 400 Thousand/uL 170  158  170        Latest Ref Rng & Units 04/24/2022   10:01 AM 04/05/2022    9:46 AM 05/17/2021    3:43 PM  CMP  Glucose 70 - 99 mg/dL 90  85  82   BUN 8 - 23 mg/dL _0 Creatinine 0.44 - 1.00 mg/dL 0.80  0.77  0.74   Sodium 135 - 145 mmol/L 136  138  146   Potassium 3.5 - 5.1 mmol/L 4.2  4.7  4.5   Chloride 98 - 111 mmol/L 107  109  107   CO2 22 - 32 mmol/L _1 Calcium 8.9 - 10.3 mg/dL 9.4  8.8  9.9   Total Protein 6.5 - 8.1 g/dL 11.1  11.4  7.7   Total Bilirubin 0.3 - 1.2 mg/dL 0.5  0.5  0.7   Alkaline Phos 38 - 126 U/L 73     AST 15 - 41 U/L _2 ALT 0 - 44 U/L _3 RADIOGRAPHIC STUDIES: I have personally reviewed the radiological images as listed and agreed with the findings in the report. No results found.   Orders Placed This Encounter  Procedures   Multiple Myeloma Panel (SPEP&IFE w/QIG)    Standing Status:   Standing    Number of Occurrences:   20    Standing Expiration Date:   04/25/2023   Kappa/lambda light chains    Standing  Status:   Standing    Number of Occurrences:   20    Standing Expiration Date:   04/25/2023   UPEP/UIFE/Light Chains/TP, 24-Hr Ur    Standing Status:   Standing    Number of  Occurrences:   20    Standing Expiration Date:   04/25/2023   Comprehensive metabolic panel    Standing Status:   Standing    Number of Occurrences:   50    Standing Expiration Date:   04/25/2023   Heavy metals, blood    Standing Status:   Future    Number of Occurrences:   1    Standing Expiration Date:   04/24/2023    Order Specific Question:   [place] of residence?    Answer:   [place] [[id number]]   Technologist smear review    Standing Status:   Future    Number of Occurrences:   1    Standing Expiration Date:   04/25/2023   Copper, serum    Standing Status:   Future    Number of Occurrences:   1    Standing Expiration Date:   04/24/2023   Lactate dehydrogenase (LDH)    Standing Status:   Future    Number of Occurrences:   1    Standing Expiration Date:   04/24/2023   Beta 2 microglobulin, serum    Standing Status:   Future    Number of Occurrences:   1    Standing Expiration Date:   04/24/2023   CMP ([place] only)    Standing Status:   Future    Number of Occurrences:   1    Standing Expiration Date:   04/25/2023    All questions were answered. The patient knows to call the clinic with any problems, questions or concerns. The total time spent in the appointment was 45 minutes.     [name], MD 04/24/2022 1:17 PM  I, Wilburn Mylar, am acting as scribe for [name], MD.   I have reviewed the above documentation for accuracy and completeness, and I agree with the above.

## 2022-04-25 LAB — HEAVY METALS, BLOOD

## 2022-04-25 LAB — KAPPA/LAMBDA LIGHT CHAINS
Kappa free light chain: 9.8 mg/L (ref 3.3–19.4)
Kappa, lambda light chain ratio: 0 — ABNORMAL LOW (ref 0.26–1.65)

## 2022-04-25 LAB — BETA 2 MICROGLOBULIN, SERUM: Beta-2 Microglobulin: 3.2 mg/L — ABNORMAL HIGH (ref 0.6–2.4)

## 2022-04-26 ENCOUNTER — Telehealth: Payer: Self-pay | Admitting: *Deleted

## 2022-04-26 ENCOUNTER — Telehealth: Payer: Self-pay | Admitting: Hematology

## 2022-04-26 ENCOUNTER — Other Ambulatory Visit: Payer: Self-pay | Admitting: *Deleted

## 2022-04-26 DIAGNOSIS — R809 Proteinuria, unspecified: Secondary | ICD-10-CM

## 2022-04-26 DIAGNOSIS — D649 Anemia, unspecified: Secondary | ICD-10-CM

## 2022-04-26 LAB — COPPER, SERUM: Copper: 136 ug/dL (ref 80–158)

## 2022-04-26 NOTE — Telephone Encounter (Signed)
This  RN scheduled BMBX per MD for 7/25 at 730 am with LCC/DNP  Verified bed availability with charge nurse Scheduled with Flow per Estill Bamberg in Flow lab at [facility] LOS sent per above with lab 30 minutes post  Pt contacted - informed her of MD request due to need for further evaluation for diagnosis. Procedure explained as well as verified time and location.  Questions answered per this RN ( location of bx as well as " so may labs were bad")  Note this RN reassured pt need for further evaluation at this time for MD to be able to make that decision- which is why we are doing the biopsy.  Pt stated appreciation for above with no further questions.

## 2022-04-26 NOTE — Telephone Encounter (Signed)
.  Called patient to schedule appointment per 7/19 inbasket, patient is aware of date and time.   

## 2022-04-27 LAB — MULTIPLE MYELOMA PANEL, SERUM
Albumin SerPl Elph-Mcnc: 3.9 g/dL (ref 2.9–4.4)
Albumin/Glob SerPl: 0.6 — ABNORMAL LOW (ref 0.7–1.7)
Alpha 1: 0.2 g/dL (ref 0.0–0.4)
Alpha2 Glob SerPl Elph-Mcnc: 0.7 g/dL (ref 0.4–1.0)
B-Globulin SerPl Elph-Mcnc: 1.3 g/dL (ref 0.7–1.3)
Gamma Glob SerPl Elph-Mcnc: 4.6 g/dL — ABNORMAL HIGH (ref 0.4–1.8)
Globulin, Total: 6.8 g/dL — ABNORMAL HIGH (ref 2.2–3.9)
IgA: 26 mg/dL — ABNORMAL LOW (ref 87–352)
IgG (Immunoglobin G), Serum: 6049 mg/dL — ABNORMAL HIGH (ref 586–1602)
IgM (Immunoglobulin M), Srm: 16 mg/dL — ABNORMAL LOW (ref 26–217)
M Protein SerPl Elph-Mcnc: 4.5 g/dL — ABNORMAL HIGH
Total Protein ELP: 10.7 g/dL — ABNORMAL HIGH (ref 6.0–8.5)

## 2022-04-29 ENCOUNTER — Other Ambulatory Visit: Payer: Self-pay | Admitting: Hematology

## 2022-04-29 DIAGNOSIS — C9000 Multiple myeloma not having achieved remission: Secondary | ICD-10-CM

## 2022-04-30 ENCOUNTER — Other Ambulatory Visit: Payer: Self-pay

## 2022-04-30 DIAGNOSIS — C9000 Multiple myeloma not having achieved remission: Secondary | ICD-10-CM

## 2022-05-01 ENCOUNTER — Inpatient Hospital Stay: Payer: BC Managed Care – PPO

## 2022-05-01 ENCOUNTER — Inpatient Hospital Stay (HOSPITAL_BASED_OUTPATIENT_CLINIC_OR_DEPARTMENT_OTHER): Payer: BC Managed Care – PPO | Admitting: Adult Health

## 2022-05-01 ENCOUNTER — Other Ambulatory Visit: Payer: Self-pay

## 2022-05-01 DIAGNOSIS — R7989 Other specified abnormal findings of blood chemistry: Secondary | ICD-10-CM

## 2022-05-01 DIAGNOSIS — R809 Proteinuria, unspecified: Secondary | ICD-10-CM

## 2022-05-01 DIAGNOSIS — C9000 Multiple myeloma not having achieved remission: Secondary | ICD-10-CM

## 2022-05-01 DIAGNOSIS — D649 Anemia, unspecified: Secondary | ICD-10-CM | POA: Diagnosis not present

## 2022-05-01 LAB — CBC WITH DIFFERENTIAL (CANCER CENTER ONLY)
Abs Immature Granulocytes: 0.18 10*3/uL — ABNORMAL HIGH (ref 0.00–0.07)
Basophils Absolute: 0 10*3/uL (ref 0.0–0.1)
Basophils Relative: 1 %
Eosinophils Absolute: 0.1 10*3/uL (ref 0.0–0.5)
Eosinophils Relative: 1 %
HCT: 29.7 % — ABNORMAL LOW (ref 36.0–46.0)
Hemoglobin: 9.9 g/dL — ABNORMAL LOW (ref 12.0–15.0)
Immature Granulocytes: 2 %
Lymphocytes Relative: 53 %
Lymphs Abs: 4 10*3/uL (ref 0.7–4.0)
MCH: 32.8 pg (ref 26.0–34.0)
MCHC: 33.3 g/dL (ref 30.0–36.0)
MCV: 98.3 fL (ref 80.0–100.0)
Monocytes Absolute: 0.8 10*3/uL (ref 0.1–1.0)
Monocytes Relative: 11 %
Neutro Abs: 2.3 10*3/uL (ref 1.7–7.7)
Neutrophils Relative %: 32 %
Platelet Count: 145 10*3/uL — ABNORMAL LOW (ref 150–400)
RBC: 3.02 MIL/uL — ABNORMAL LOW (ref 3.87–5.11)
RDW: 15 % (ref 11.5–15.5)
Smear Review: NORMAL
WBC Count: 7.4 10*3/uL (ref 4.0–10.5)
nRBC: 2.3 % — ABNORMAL HIGH (ref 0.0–0.2)

## 2022-05-01 LAB — COMPREHENSIVE METABOLIC PANEL
ALT: 17 U/L (ref 0–44)
AST: 21 U/L (ref 15–41)
Albumin: 3.4 g/dL — ABNORMAL LOW (ref 3.5–5.0)
Alkaline Phosphatase: 67 U/L (ref 38–126)
Anion gap: 3 — ABNORMAL LOW (ref 5–15)
BUN: 13 mg/dL (ref 8–23)
CO2: 25 mmol/L (ref 22–32)
Calcium: 8.9 mg/dL (ref 8.9–10.3)
Chloride: 108 mmol/L (ref 98–111)
Creatinine, Ser: 0.72 mg/dL (ref 0.44–1.00)
GFR, Estimated: >60 mL/min (ref >60)
Glucose, Bld: 96 mg/dL (ref 70–99)
Potassium: 4 mmol/L (ref 3.5–5.1)
Sodium: 136 mmol/L (ref 135–145)
Total Bilirubin: 0.5 mg/dL (ref 0.3–1.2)
Total Protein: 10.4 g/dL — ABNORMAL HIGH (ref 6.5–8.1)

## 2022-05-01 LAB — UPEP/UIFE/LIGHT CHAINS/TP, 24-HR UR
% BETA, Urine: 2.9 %
ALPHA 1 URINE: 0.5 %
Albumin, U: 5.5 %
Alpha 2, Urine: 0.9 %
Free Kappa Lt Chains,Ur: 25.47 mg/L (ref 1.17–86.46)
Free Kappa/Lambda Ratio: 0 — ABNORMAL LOW (ref 1.83–14.26)
GAMMA GLOBULIN URINE: 90.1 %
M-SPIKE %, Urine: 83.8 % — ABNORMAL HIGH
M-Spike, Mg/24 Hr: 9813 mg/24 hr — ABNORMAL HIGH
Total Protein, Urine-Ur/day: 11710 mg/24 hr — ABNORMAL HIGH (ref 30–150)
Total Protein, Urine: 600.5 mg/dL
Total Volume: 1950

## 2022-05-01 MED ORDER — LIDOCAINE HCL 2 % IJ SOLN
20.0000 mL | Freq: Once | INTRAMUSCULAR | Status: DC
  Filled 2022-05-01: qty 20

## 2022-05-01 NOTE — Patient Instructions (Signed)
Bone Marrow Aspiration and Bone Marrow Biopsy, Adult, Care After This sheet gives you information about how to care for yourself after your procedure. Your health care provider may also give you more specific instructions. If you have problems or questions, contact your health care provider. What can I expect after the procedure? After the procedure, it is common to have: Mild pain and tenderness. Swelling. Bruising. Follow these instructions at home: Puncture site care  Follow instructions from your health care provider about how to take care of the puncture site. Make sure you: Wash your hands with soap and water before and after you change your bandage (dressing). If soap and water are not available, use hand sanitizer. Change your dressing as told by your health care provider. Check your puncture site every day for signs of infection. Check for: More redness, swelling, or pain. Fluid or blood. Warmth. Pus or a bad smell. Activity Return to your normal activities as told by your health care provider. Ask your health care provider what activities are safe for you. Do not lift anything that is heavier than 10 lb (4.5 kg), or the limit that you are told, until your health care provider says that it is safe. Do not drive for 24 hours if you were given a sedative during your procedure. General instructions  Take over-the-counter and prescription medicines only as told by your health care provider. Do not take baths, swim, or use a hot tub until your health care provider approves. Ask your health care provider if you may take showers. You may only be allowed to take sponge baths. If directed, put ice on the affected area. To do this: Put ice in a plastic bag. Place a towel between your skin and the bag. Leave the ice on for 20 minutes, 2-3 times a day. Keep all follow-up visits as told by your health care provider. This is important. Contact a health care provider if: Your pain is not  controlled with medicine. You have a fever. You have more redness, swelling, or pain around the puncture site. You have fluid or blood coming from the puncture site. Your puncture site feels warm to the touch. You have pus or a bad smell coming from the puncture site. Summary After the procedure, it is common to have mild pain, tenderness, swelling, and bruising. Follow instructions from your health care provider about how to take care of the puncture site and what activities are safe for you. Take over-the-counter and prescription medicines only as told by your health care provider. Contact a health care provider if you have any signs of infection, such as fluid or blood coming from the puncture site. This information is not intended to replace advice given to you by your health care provider. Make sure you discuss any questions you have with your health care provider. Document Revised: 02/10/2019 Document Reviewed: 02/10/2019 Elsevier Patient Education  [place].

## 2022-05-01 NOTE — Progress Notes (Signed)
INDICATION: refractory anemia, M spike, r/o myeloma  Brief examination was performed. ENT: adequate airway clearance Heart: regular rate and rhythm.No Murmurs Lungs: clear to auscultation, no wheezes, normal respiratory effort  Bone Marrow Biopsy and Aspiration Procedure Note   Informed consent was obtained and potential risks including bleeding, infection and pain were reviewed with the patient.  The patient's name, date of birth, identification, consent and allergies were verified prior to the start of procedure and time out was performed.  The left posterior iliac crest was chosen as the site of biopsy.  The skin was prepped with ChloraPrep.   14 cc of 2% lidocaine was used to provide local anaesthesia.   10 cc of bone marrow aspirate was obtained.  Aspirate aparticulate thereofore 2 1cm biopsies were obtained. Pressure was applied to the biopsy site and bandage was placed over the biopsy site. Patient was made to lie on the back for 30 mins prior to discharge.  The procedure was tolerated well. COMPLICATIONS: None BLOOD LOSS: none The patient was discharged home in stable condition with a 1 week follow up to review results.  Patient was provided with post bone marrow biopsy instructions and instructed to call if there was any bleeding or worsening pain.  Specimens sent for flow cytometry, cytogenetics and additional studies.  Signed Scot Dock, NP

## 2022-05-02 LAB — KAPPA/LAMBDA LIGHT CHAINS
Kappa free light chain: 10.2 mg/L (ref 3.3–19.4)
Kappa, lambda light chain ratio: 0 — ABNORMAL LOW (ref 0.26–1.65)

## 2022-05-04 LAB — SURGICAL PATHOLOGY

## 2022-05-07 ENCOUNTER — Encounter (HOSPITAL_COMMUNITY)
Admission: RE | Admit: 2022-05-07 | Discharge: 2022-05-07 | Disposition: A | Discharge status: Home / Self Care | Payer: BC Managed Care – PPO | Source: Ambulatory Visit | Attending: Hematology | Admitting: Hematology

## 2022-05-07 DIAGNOSIS — C9000 Multiple myeloma not having achieved remission: Secondary | ICD-10-CM | POA: Insufficient documentation

## 2022-05-07 LAB — MULTIPLE MYELOMA PANEL, SERUM
Albumin SerPl Elph-Mcnc: 3.6 g/dL (ref 2.9–4.4)
Albumin/Glob SerPl: 0.6 — ABNORMAL LOW (ref 0.7–1.7)
Alpha 1: 0.2 g/dL (ref 0.0–0.4)
Alpha2 Glob SerPl Elph-Mcnc: 0.7 g/dL (ref 0.4–1.0)
B-Globulin SerPl Elph-Mcnc: 1.2 g/dL (ref 0.7–1.3)
Gamma Glob SerPl Elph-Mcnc: 4.4 g/dL — ABNORMAL HIGH (ref 0.4–1.8)
Globulin, Total: 6.5 g/dL — ABNORMAL HIGH (ref 2.2–3.9)
IgA: 24 mg/dL — ABNORMAL LOW (ref 87–352)
IgG (Immunoglobin G), Serum: 4957 mg/dL — ABNORMAL HIGH (ref 586–1602)
IgM (Immunoglobulin M), Srm: 14 mg/dL — ABNORMAL LOW (ref 26–217)
M Protein SerPl Elph-Mcnc: 4.2 g/dL — ABNORMAL HIGH
Total Protein ELP: 10.1 g/dL — ABNORMAL HIGH (ref 6.0–8.5)

## 2022-05-07 LAB — GLUCOSE, CAPILLARY: Glucose-Capillary: 91 mg/dL (ref 70–99)

## 2022-05-07 MED ORDER — FLUDEOXYGLUCOSE F - 18 (FDG) INJECTION
9.6000 | Freq: Once | INTRAVENOUS | Status: AC | PRN
  Administered 2022-05-07: 9.2 via INTRAVENOUS

## 2022-05-08 ENCOUNTER — Inpatient Hospital Stay: Payer: Medicare Other | Attending: Hematology | Admitting: Hematology

## 2022-05-08 ENCOUNTER — Encounter: Payer: Self-pay | Admitting: Hematology

## 2022-05-08 ENCOUNTER — Other Ambulatory Visit: Payer: Self-pay

## 2022-05-08 VITALS — BP 162/76 | HR 74 | Temp 97.5°F | Resp 15 | Wt 187.1 lb

## 2022-05-08 DIAGNOSIS — Z5112 Encounter for antineoplastic immunotherapy: Secondary | ICD-10-CM | POA: Insufficient documentation

## 2022-05-08 DIAGNOSIS — D6481 Anemia due to antineoplastic chemotherapy: Secondary | ICD-10-CM | POA: Insufficient documentation

## 2022-05-08 DIAGNOSIS — C9000 Multiple myeloma not having achieved remission: Secondary | ICD-10-CM | POA: Insufficient documentation

## 2022-05-08 DIAGNOSIS — Z79899 Other long term (current) drug therapy: Secondary | ICD-10-CM | POA: Insufficient documentation

## 2022-05-08 MED ORDER — ONDANSETRON HCL 8 MG PO TABS: 8.0000 mg | ORAL_TABLET | Freq: Three times a day (TID) | ORAL | 1 refills | Status: DC | PRN

## 2022-05-08 MED ORDER — ACYCLOVIR 400 MG PO TABS: 400.0000 mg | ORAL_TABLET | Freq: Every day | ORAL | 2 refills | Status: DC

## 2022-05-08 NOTE — Progress Notes (Signed)
START ON PATHWAY REGIMEN - Multiple Myeloma and Other Plasma Cell Dyscrasias   DaraVRd (Daratumumab IV + Bortezomib SUBQ + Lenalidomide PO + Dexamethasone IV/PO) q21 Days (Induction Schema):   A cycle is every 21 days:     Lenalidomide      Dexamethasone      Bortezomib      Daratumumab    DaraVRd (Daratumumab IV + Bortezomib SUBQ + Lenalidomide PO + Dexamethasone IV/PO) q21 Days (Consolidation Schema):   A cycle is every 21 days:     Lenalidomide      Dexamethasone      Bortezomib      Daratumumab   **Always confirm dose/schedule in your pharmacy ordering system**  Patient Characteristics: Multiple Myeloma, Newly Diagnosed, Transplant Eligible, High Risk Disease Classification: Multiple Myeloma R-ISS Staging: II Therapeutic Status: Newly Diagnosed Is Patient Eligible for Transplant<= Transplant Eligible Risk Status: High Risk Intent of Therapy: Curative Intent, Discussed with Patient

## 2022-05-08 NOTE — Progress Notes (Signed)
[facility]   Telephone:(336) [phone] Fax:([phone]) [phone]   Clinic Follow up Note   Patient Care Team: Unk Pinto, MD as PCP - General (Internal Medicine) Sable Feil, MD as Consulting Physician (Gastroenterology) Camillo Flaming, [place] as Referring Physician (Optometry) Fay Records, MD as Consulting Physician (Cardiology) Daryll Brod, MD as Consulting Physician (Orthopedic Surgery)  Date of Service:  05/08/2022  CHIEF COMPLAINT: f/u of newly diagnosed muliple myeloma  CURRENT THERAPY:  To [facility] DVd (daratumumab, Velcade, and dexamethasone) weekly  ASSESSMENT & PLAN:  Alyssa Dudley is a 66 y.o. female with   1. Multiple Myeloma, IgG lambda type, stage II -she presented with new onset anemia and proteinuria, no bone pain  -labs obtained on 04/24/22 showed significantly elevated M protein at 4.5, IgG at 6,049, with significantly elevated lambda free light chain at 12922m/L, normal kappa light chain, 24hr urine obtained 04/26/22 showed significant elevation of total protein to 11,7116m24hr  -she underwent bone marrow biopsy on 05/01/22, pathology confirmed plasma cell myeloma (60% plasma cells). -she meets criteria of CRAB (anemia and bone lesions on PET), normal creatinine and calcium level -PET scan performed yesterday, 05/07/22, showed: diffuse hypermetabolic bone marrow activity throughout skeleton. -I reviewed the recent results with patient and her family today. I explained that she meets the diagnostic criteria for multiple myeloma, which is very treatable and possibly curable, but she will need long-term maintenance therapy due to the high risk of relapse. I discussed that this seems to be early stage, and she does not have any bone pain, or organ dysfunction. However, she was develop renal failure, bone pain or even fracture, if remains untreated.   -I explained that we are still awaiting cytogenetics and FISH results, for risk stratification.   -I recommend  she begin treatment next week. I discussed first-line treatment options, including DRVD (daratumumab, Revlimid, Velcade and dexamethasone), or RVD.  Given her high burden disease, I would recommend DRVD for rapid response and high chance remission. -I also discussed the role of autologous bone marrow transplant, which she would be a candidate for.  [facility]  --Chemotherapy consent: Side effects including but does not not limited to, fatigue, nausea, vomiting, diarrhea, neuropathy, fluid retention, renal and kidney dysfunction, neutropenic fever, severe infection and sepsis, needed for blood transfusion, bleeding, risk of thrombosis and secondary cancer from Revlimid, were discussed with patient in great detail. She agrees to proceed. -She is already on baby aspirin, will continue.  I will also call in acyclovir for herpes prophylaxis.  She had shingles vaccines already -she notes her [birth year], and she is leaving for a short trip 8/16 - 8/19. We will plan for her first injection to be given on Monday, 8/7, then the second on 8/14. I reassured her we can cancel her 8/14 dose if she does not recover well so that she can travel.   2.  Normocytic hypoproductive anemia -routine CBC with her PCP on 04/05/22 showed hgb 11, it was normal last year. Repeat on 04/19/22 showed further drop to 10.1, with iron 126 and ferritin 357.  No lab evidence of iron deficiency or B12 deficiency.  -most recent colonoscopy from 07/28/21 was benign, recall due 2027. POC cards obtained 04/20/22 were all negative.  -her anemia is secondary to MM  3.  Atrial fibrillation, hypertension, status post pacemaker placement      PLAN:  -chemo education this week  -I will call in revlimid  4m daily day 1-21 every 28 days -proceed with DVD weekly 8/7 or 8/8 and 8/14 -f/u every 2 weeks   No problem-specific Assessment & Plan notes found for this encounter.   INTERVAL  HISTORY:  Alyssa JKearseis here for a follow up of MM. She was last seen by me on 04/24/22 in consultation. She presents to the clinic accompanied by her husband and daughter. She reports she is doing well, no new changes from last visit.   All other systems were reviewed with the patient and are negative.  MEDICAL HISTORY:  Past Medical History:  Diagnosis Date   Allergy    once a year    Hyperlipidemia    borderline- no meds    Pacemaker    [place]ox[place]   detected on ppm interrogation   PONV (postoperative nausea and vomiting)    has vomitted in past   Second degree Mobitz II AV block    Vitamin D deficiency     SURGICAL HISTORY: Past Surgical History:  Procedure Laterality Date   BREAST CYST EXCISION  1980   COLONOSCOPY     EXTRACORPOREAL SHOCK WAVE LITHOTRIPSY Right 12/28/2019   Procedure: RIGHT EXTRACORPOREAL SHOCK WAVE LITHOTRIPSY (ESWL);  Surgeon: BRaynelle Bring MD;  [facility]  Service: Urology;  Laterality: Right;   PERMANENT PACEMAKER INSERTION N/A 01/13/2013   SJM Acc[employer] DR RF implanted by Dr ARayann Hemanfor mobitz II AV block   PLANTAR FASCIECTOMY     POLYPECTOMY     WRIST SURGERY Right 2011   removed cyst    I have reviewed the social history and family history with the patient and they are unchanged from previous note.  ALLERGIES:  has No Known Allergies.  MEDICATIONS:  Current Outpatient Medications  Medication Sig Dispense Refill   acyclovir (ZOVIRAX) 400 MG tablet Take 1 tablet (400 mg total) by mouth 5 (five) times daily. 60 tablet 2   aspirin 81 MG chewable tablet Chew by mouth daily.     atenolol (TENORMIN) 50 MG tablet Take  1 tablet  Daily  for BP 90 tablet 3   ondansetron (ZOFRAN) 8 MG tablet Take 1 tablet (8 mg total) by mouth every 8 (eight) hours as needed for nausea or vomiting. 30 tablet 1   triamcinolone cream (KENALOG) 0.1 % APPLY EXTERNALLY TO THE AFFECTED AREA THREE TIMES DAILY AS NEEDED  FOR RASH 30 g 3   Vitamin D, Ergocalciferol, (DRISDOL) 1.25 MG (50000 UNIT) CAPS capsule TAKE 1 CAPSULE BY MOUTH TWICE WEEKLY 180 capsule 0   Current Facility-Administered Medications  Medication Dose Route Frequency Provider Last Rate Last Admin   0.9 %  sodium chloride infusion  500 mL Intravenous Once Pyrtle, JLajuan Lines MD        PHYSICAL EXAMINATION: ECOG PERFORMANCE STATUS: 1 - Symptomatic but completely ambulatory  Vitals:   05/08/22 1024  BP: (!) 162/76  Pulse: 74  Resp: 15  Temp: (!) 97.5 F (36.4 C)  SpO2: 100%   Wt Readings from Last 3 Encounters:  05/08/22 187 lb 1.6 oz (84.9 kg)  04/24/22 185 lb 8 oz (84.1 kg)  04/19/22 186 lb 6.4 oz (84.6 kg)     GENERAL:alert, no distress and comfortable SKIN: skin color normal, no rashes or significant lesions EYES: normal, Conjunctiva are pink and non-injected, sclera clear  NEURO: alert & oriented x 3 with fluent speech  LABORATORY DATA:  I have reviewed the data as listed    Latest Ref  Rng & Units 05/01/2022    8:34 AM 04/23/2022    9:11 AM 04/19/2022    8:55 AM  CBC  WBC 4.0 - 10.5 K/uL 7.4  8.8  6.3   Hemoglobin 12.0 - 15.0 g/dL 9.9  11.3  10.1   Hematocrit 36.0 - 46.0 % 29.7  33.7  30.3   Platelets 150 - 400 K/uL 145  170  158         Latest Ref Rng & Units 05/01/2022    8:34 AM 04/24/2022   10:01 AM 04/05/2022    9:46 AM  CMP  Glucose 70 - 99 mg/dL 96  90  85   BUN 8 - 23 mg/dL '13  11  14   ' Creatinine 0.44 - 1.00 mg/dL 0.72  0.80  0.77   Sodium 135 - 145 mmol/L 136  136  138   Potassium 3.5 - 5.1 mmol/L 4.0  4.2  4.7   Chloride 98 - 111 mmol/L 108  107  109   CO2 22 - 32 mmol/L '25  28  25   ' Calcium 8.9 - 10.3 mg/dL 8.9  9.4  8.8   Total Protein 6.5 - 8.1 g/dL 10.4  11.1  11.4   Total Bilirubin 0.3 - 1.2 mg/dL 0.5  0.5  0.5   Alkaline Phos 38 - 126 U/L 67  73    AST 15 - 41 U/L '21  25  23   ' ALT 0 - 44 U/L '17  22  20       ' RADIOGRAPHIC STUDIES: I have personally reviewed the radiological images as listed  and agreed with the findings in the report. NM PET Image Initial (PI) Skull Base To Thigh  Result Date: 05/07/2022 CLINICAL DATA:  Initial treatment strategy for multiple myeloma. EXAM: NUCLEAR MEDICINE PET SKULL BASE TO THIGH TECHNIQUE: 9.2 mCi F-18 FDG was injected intravenously. Full-ring PET imaging was performed from the skull base to thigh after the radiotracer. CT data was obtained and used for attenuation correction and anatomic localization. Fasting blood glucose: 91 mg/dl COMPARISON:  CT abdomen pelvis June 29, 2019 FINDINGS: Mediastinal blood pool activity: SUV max 2.1 Liver activity: SUV max NA NECK: Hypermetabolic left level 2b lymph node measures 5 mm on image 22/4 with a max SUV of 6.6. Symmetric hypermetabolic hyperplasia of the tonsils with a max SUV of 10.4. Incidental CT findings: none CHEST: No hypermetabolic thoracic lymph nodes. No hypermetabolic pulmonary nodules or masses. Incidental CT findings: Left chest cardiac generator with leads in the right atrium and right ventricle. Aortic atherosclerosis. ABDOMEN/PELVIS: No abnormal hypermetabolic activity within the liver, pancreas, adrenal glands, or spleen. No hypermetabolic lymph nodes in the abdomen or pelvis. Incidental CT findings: Hypodense non hypermetabolic left renal lesions measure fluid density consistent with cysts and requiring no independent imaging follow-up. Aortic atherosclerosis. No splenomegaly. SKELETON: Diffuse hypermetabolic marrow activity with a few foci of activity greater than that of adjacent marrow for instance in the left sacrum with a max SUV of 6.6 and in the with T7 vertebral body a max SUV of 8.7. Incidental CT findings: Multilevel degenerative changes spine with multifocal degenerative joint disease. IMPRESSION: 1. Diffuse hypermetabolic bone marrow activity throughout the axial and proximal appendicular skeleton with scattered foci of activity greater than that of adjacent marrow, most consistent with  myelomatous disease involvement. 2. Hypermetabolic left level 2b lymph node measures 5 mm, nonspecific. Attention on follow-up imaging suggested. 3. Symmetric hypermetabolic hyperplasia of the tonsils, nonspecific but commonly reactive. Attention  on follow-up imaging suggested. 4.  Aortic Atherosclerosis (ICD10-I70.0). Electronically Signed   By: Dahlia Bailiff M.D.   On: 05/07/2022 10:10      Orders Placed This Encounter  Procedures   Pretreatment RBC phenotype    Obtain prior to daratumumab treatment.    Standing Status:   Future    Standing Expiration Date:   05/14/2023    Order Specific Question:   Medication to be given:    Answer:   Daratumumab   All questions were answered. The patient knows to call the clinic with any problems, questions or concerns. No barriers to learning was detected. The total time spent in the appointment was 60 minutes.     [name], MD 05/08/2022   I, Wilburn Mylar, am acting as scribe for [name], MD.   I have reviewed the above documentation for accuracy and completeness, and I agree with the above.

## 2022-05-09 ENCOUNTER — Telehealth: Payer: Self-pay

## 2022-05-09 ENCOUNTER — Other Ambulatory Visit: Payer: Self-pay

## 2022-05-09 ENCOUNTER — Telehealth: Payer: Self-pay | Admitting: Pharmacy Technician

## 2022-05-09 ENCOUNTER — Other Ambulatory Visit (HOSPITAL_COMMUNITY): Payer: Self-pay

## 2022-05-09 ENCOUNTER — Inpatient Hospital Stay: Payer: Medicare Other

## 2022-05-09 ENCOUNTER — Encounter: Payer: Self-pay | Admitting: Hematology

## 2022-05-09 ENCOUNTER — Encounter (HOSPITAL_COMMUNITY): Payer: Self-pay | Admitting: Hematology

## 2022-05-09 DIAGNOSIS — C9000 Multiple myeloma not having achieved remission: Secondary | ICD-10-CM

## 2022-05-09 MED ORDER — LENALIDOMIDE 25 MG PO CAPS: 25.0000 mg | ORAL_CAPSULE | Freq: Every day | ORAL | 0 refills | Status: DC

## 2022-05-09 NOTE — Addendum Note (Signed)
Addended by: [name] on: 05/09/2022 05:06 PM   Modules accepted: Orders

## 2022-05-09 NOTE — Telephone Encounter (Signed)
Oral Oncology Pharmacist Encounter  Received new prescription for Revlimid (lenalidomide) for the treatment of multiple myeloma in conjunction with daratumumab, Velcade (bortezomib) and dexamethasone, planned duration until disease progression or unacceptable drug toxicity.  CMP and CBC from 05/05/2022 assessed, no relevant lab abnormalities. Prescription dose and frequency assessed.   Current medication list in Epic reviewed, there are no DDIs with lenalidomide identified:  Evaluated chart and no patient barriers to medication adherence identified.   Prescription has been e-scribed to [employer] for benefits analysis and approval.  Oral Oncology Clinic will continue to follow for insurance authorization, copayment issues, initial counseling and start date.  Patient agreed to treatment on 05/08/2022 per MD documentation.  Laray Anger, PharmD PGY-2 Pharmacy Resident Hematology/Oncology [phone]  05/09/2022 9:00 AM

## 2022-05-09 NOTE — Telephone Encounter (Signed)
Oral Oncology Patient Advocate Encounter  After completing a benefits investigation, prior authorization for Lenalidomide is not required at this time through [facility].  Patient's copay is $2,872.30.     Lady Deutscher, CPhT-Adv Pharmacy Patient Advocate Specialist [facility] Patient Advocate Team Direct Number: [phone]  Fax: [phone]

## 2022-05-09 NOTE — Progress Notes (Signed)
Setup pt's account w/[employer] for Revlimid.  Faxed signed prescription along with pt's medication list and insurance cards to [employer] [facility].

## 2022-05-09 NOTE — Telephone Encounter (Signed)
Faxed referral order with patient demographics, pathology report, Dr. Ernestina Penna last office note, and referral order to Dr. Cherylann Ratel at [facility] [phone]  [phone]).  Epic faxed and manually faxed information.  Fax confirmation received for both methods.

## 2022-05-09 NOTE — Telephone Encounter (Signed)
Oral Oncology Pharmacist Encounter  Revlimid prescription updated to quantity 14 instead of original 21 capsules per Dr. Burr Medico (based on GRIFFIN trial).   Updated prescription redirected to [facility] for dispensing and quantity change has been updated with [facility] program.    Leron Croak, PharmD, BCPS, [facility] Hematology/Oncology Clinical Pharmacist [facility] and [place] [phone] 05/09/2022 10:57 AM

## 2022-05-10 ENCOUNTER — Other Ambulatory Visit (HOSPITAL_COMMUNITY): Payer: Self-pay

## 2022-05-10 ENCOUNTER — Other Ambulatory Visit: Payer: Self-pay | Admitting: Hematology

## 2022-05-10 ENCOUNTER — Telehealth: Payer: Self-pay | Admitting: Hematology

## 2022-05-10 MED ORDER — ACYCLOVIR 400 MG PO TABS: 400.0000 mg | ORAL_TABLET | Freq: Two times a day (BID) | ORAL | 2 refills | Status: DC

## 2022-05-10 NOTE — Telephone Encounter (Signed)
Scheduled follow-up appointments per 8/1 los. Patient is aware.

## 2022-05-10 NOTE — Telephone Encounter (Addendum)
Oral Chemotherapy Pharmacist Encounter  Patient Education  I spoke with patient for overview of: Revlimid for the treatment of multiple myeloma in conjunction with daratumumab, Velcade and dexamethasone, planned duration ~4-6 cycles, depending on response.   Counseled patient on administration, dosing, side effects, monitoring, drug-food interactions, safe handling, storage, and disposal.  Patient will take Revlimid 20m capsules, 1 capsule by mouth once daily for 14 days, then off for 7 days; repeat every 21 days, without regard to food, with a full glass of water.  Revlimid will be given 14 days on, 7 days off, repeat every 21 days.  Revlimid start date: 05/15/2022  Adverse effects of Revlimid include but are not limited to: nausea, constipation, diarrhea, abdominal pain, rash, fatigue, drug fever, and decreased blood counts.    Reviewed with patient importance of keeping a medication schedule and plan for any missed doses. No barriers to medication adherence identified.  Medication reconciliation performed and medication/allergy list updated.  Patient counseled on importance of continuing her daily aspirin 861mfor VTE prophylaxis.  Insurance authorization for Revlimid has been obtained.  Revlimid prescription is being dispensed from [facility] it is a limited distribution medication.  All questions answered.  Ms. JaKossmanoiced understanding and appreciation.   Medication education handout placed in mail for patient. Patient knows to call the office with questions or concerns. Oral Chemotherapy Clinic phone number provided to patient.   KaLaray AngerPharmD PGY-2 Pharmacy Resident Hematology/Oncology [phone] 52218/12/2021 1:41 PM

## 2022-05-10 NOTE — Progress Notes (Signed)
Pharmacist Chemotherapy Monitoring - Initial Assessment    Anticipated start date: 05/15/22   The following has been reviewed per standard work regarding the patient's treatment regimen: The patient's diagnosis, treatment plan and drug doses, and organ/hematologic function Lab orders and baseline tests specific to treatment regimen  The treatment plan start date, drug sequencing, and pre-medications Prior authorization status  Patient's documented medication list, including drug-drug interaction screen and prescriptions for anti-emetics and supportive care specific to the treatment regimen The drug concentrations, fluid compatibility, administration routes, and timing of the medications to be used The patient's access for treatment and lifetime cumulative dose history, if applicable  The patient's medication allergies and previous infusion related reactions, if applicable   Changes made to treatment plan:  pre-medications Need to switch IV Dex to PO--> msg'd Dr. Burr Medico, drug offset times, pretx RBC and T&S lab ordered to monitor   with Dara, IV fluid, and administration route  Follow up needed:  adding pre-medications - Need PO Dex since pt won't need IV access?   Kennith Center, Pharm.D., CPP 05/10/2022'@1'$ :58 PM

## 2022-05-12 ENCOUNTER — Encounter: Payer: Self-pay | Admitting: Hematology

## 2022-05-14 ENCOUNTER — Encounter (HOSPITAL_COMMUNITY): Payer: Self-pay | Admitting: Hematology

## 2022-05-14 ENCOUNTER — Ambulatory Visit (INDEPENDENT_AMBULATORY_CARE_PROVIDER_SITE_OTHER): Payer: Medicare Other

## 2022-05-14 DIAGNOSIS — I480 Paroxysmal atrial fibrillation: Secondary | ICD-10-CM

## 2022-05-14 MED FILL — Dexamethasone Sodium Phosphate Inj 100 MG/10ML: INTRAMUSCULAR | Qty: 2 | Status: AC

## 2022-05-15 ENCOUNTER — Inpatient Hospital Stay: Payer: Medicare Other

## 2022-05-15 ENCOUNTER — Other Ambulatory Visit: Payer: Self-pay

## 2022-05-15 VITALS — BP 153/76 | HR 79 | Temp 98.2°F | Resp 18 | Wt 187.0 lb

## 2022-05-15 DIAGNOSIS — C9000 Multiple myeloma not having achieved remission: Secondary | ICD-10-CM

## 2022-05-15 DIAGNOSIS — Z5112 Encounter for antineoplastic immunotherapy: Secondary | ICD-10-CM | POA: Diagnosis not present

## 2022-05-15 LAB — CBC WITH DIFFERENTIAL (CANCER CENTER ONLY)
Abs Immature Granulocytes: 0.2 10*3/uL — ABNORMAL HIGH (ref 0.00–0.07)
Band Neutrophils: 2 %
Basophils Absolute: 0.1 10*3/uL (ref 0.0–0.1)
Basophils Relative: 1 %
Eosinophils Absolute: 0 10*3/uL (ref 0.0–0.5)
Eosinophils Relative: 0 %
HCT: 27.5 % — ABNORMAL LOW (ref 36.0–46.0)
Hemoglobin: 9.5 g/dL — ABNORMAL LOW (ref 12.0–15.0)
Lymphocytes Relative: 57 %
Lymphs Abs: 4.4 10*3/uL — ABNORMAL HIGH (ref 0.7–4.0)
MCH: 34.2 pg — ABNORMAL HIGH (ref 26.0–34.0)
MCHC: 34.5 g/dL (ref 30.0–36.0)
MCV: 98.9 fL (ref 80.0–100.0)
Metamyelocytes Relative: 3 %
Monocytes Absolute: 0.4 10*3/uL (ref 0.1–1.0)
Monocytes Relative: 5 %
Neutro Abs: 2.7 10*3/uL (ref 1.7–7.7)
Neutrophils Relative %: 32 %
Platelet Count: 139 10*3/uL — ABNORMAL LOW (ref 150–400)
RBC: 2.78 MIL/uL — ABNORMAL LOW (ref 3.87–5.11)
RDW: 15.6 % — ABNORMAL HIGH (ref 11.5–15.5)
Smear Review: NORMAL
WBC Count: 7.8 10*3/uL (ref 4.0–10.5)
nRBC: 4.5 % — ABNORMAL HIGH (ref 0.0–0.2)

## 2022-05-15 LAB — CUP PACEART REMOTE DEVICE CHECK
Battery Remaining Longevity: 13 mo
Battery Remaining Percentage: 10 %
Battery Voltage: 2.83 V
Brady Statistic AP VP Percent: 64 %
Brady Statistic AP VS Percent: 1 %
Brady Statistic AS VP Percent: 36 %
Brady Statistic AS VS Percent: 1 %
Brady Statistic RA Percent Paced: 63 %
Brady Statistic RV Percent Paced: 99 %
Date Time Interrogation Session: 20230805155642
Implantable Lead Implant Date: 20140408
Implantable Lead Implant Date: 20140408
Implantable Lead Location: 753859
Implantable Lead Location: 753860
Implantable Lead Model: 1944
Implantable Lead Model: 1948
Implantable Pulse Generator Implant Date: 20140408
Lead Channel Impedance Value: 530 Ohm
Lead Channel Impedance Value: 630 Ohm
Lead Channel Pacing Threshold Amplitude: 0.375 V
Lead Channel Pacing Threshold Amplitude: 0.75 V
Lead Channel Pacing Threshold Pulse Width: 0.4 ms
Lead Channel Pacing Threshold Pulse Width: 0.4 ms
Lead Channel Sensing Intrinsic Amplitude: 12 mV
Lead Channel Sensing Intrinsic Amplitude: 3.5 mV
Lead Channel Setting Pacing Amplitude: 1 V
Lead Channel Setting Pacing Amplitude: 1.375
Lead Channel Setting Pacing Pulse Width: 0.4 ms
Lead Channel Setting Sensing Sensitivity: 2 mV
Pulse Gen Model: 2210
Pulse Gen Serial Number: 7457437

## 2022-05-15 LAB — CMP (CANCER CENTER ONLY)
ALT: 21 U/L (ref 0–44)
AST: 24 U/L (ref 15–41)
Albumin: 3.4 g/dL — ABNORMAL LOW (ref 3.5–5.0)
Alkaline Phosphatase: 68 U/L (ref 38–126)
Anion gap: 3 — ABNORMAL LOW (ref 5–15)
BUN: 12 mg/dL (ref 8–23)
CO2: 27 mmol/L (ref 22–32)
Calcium: 9 mg/dL (ref 8.9–10.3)
Chloride: 106 mmol/L (ref 98–111)
Creatinine: 0.84 mg/dL (ref 0.44–1.00)
GFR, Estimated: >60 mL/min (ref >60)
Glucose, Bld: 101 mg/dL — ABNORMAL HIGH (ref 70–99)
Potassium: 3.9 mmol/L (ref 3.5–5.1)
Sodium: 136 mmol/L (ref 135–145)
Total Bilirubin: 0.4 mg/dL (ref 0.3–1.2)
Total Protein: 10.9 g/dL — ABNORMAL HIGH (ref 6.5–8.1)

## 2022-05-15 LAB — TYPE AND SCREEN
ABO/RH(D): A POS
Antibody Screen: NEGATIVE

## 2022-05-15 LAB — PRETREATMENT RBC PHENOTYPE

## 2022-05-15 MED ORDER — DIPHENHYDRAMINE HCL 25 MG PO CAPS
25.0000 mg | ORAL_CAPSULE | Freq: Once | ORAL | Status: AC
  Administered 2022-05-15: 25 mg via ORAL
  Filled 2022-05-15: qty 1

## 2022-05-15 MED ORDER — DARATUMUMAB-HYALURONIDASE-FIHJ 1800-30000 MG-UT/15ML ~~LOC~~ SOLN
1800.0000 mg | Freq: Once | SUBCUTANEOUS | Status: AC
  Administered 2022-05-15: 1800 mg via SUBCUTANEOUS
  Filled 2022-05-15: qty 15

## 2022-05-15 MED ORDER — SODIUM CHLORIDE 0.9 % IV SOLN
20.0000 mg | Freq: Once | INTRAVENOUS | Status: DC
  Filled 2022-05-15: qty 2

## 2022-05-15 MED ORDER — ACETAMINOPHEN 325 MG PO TABS
650.0000 mg | ORAL_TABLET | Freq: Once | ORAL | Status: AC
  Administered 2022-05-15: 650 mg via ORAL
  Filled 2022-05-15: qty 2

## 2022-05-15 MED ORDER — DEXAMETHASONE 4 MG PO TABS
20.0000 mg | ORAL_TABLET | Freq: Once | ORAL | Status: AC
  Administered 2022-05-15: 20 mg via ORAL
  Filled 2022-05-15: qty 5

## 2022-05-15 MED ORDER — BORTEZOMIB CHEMO SQ INJECTION 3.5 MG (2.5MG/ML)
1.3000 mg/m2 | Freq: Once | INTRAMUSCULAR | Status: AC
  Administered 2022-05-15: 2.5 mg via SUBCUTANEOUS
  Filled 2022-05-15: qty 1

## 2022-05-15 MED ORDER — MONTELUKAST SODIUM 10 MG PO TABS
10.0000 mg | ORAL_TABLET | Freq: Once | ORAL | Status: AC
  Administered 2022-05-15: 10 mg via ORAL
  Filled 2022-05-15: qty 1

## 2022-05-15 NOTE — Patient Instructions (Signed)
[facility]  Discharge Instructions: Thank you for choosing [facility] to provide your oncology and hematology care.   If you have a lab appointment with the [facility], please go directly to the [facility] and check in at the registration area.   Wear comfortable clothing and clothing appropriate for easy access to any Portacath or PICC line.   We strive to give you quality time with your provider. You may need to reschedule your appointment if you arrive late (15 or more minutes).  Arriving late affects you and other patients whose appointments are after yours.  Also, if you miss three or more appointments without notifying the office, you may be dismissed from the clinic at the provider's discretion.      For prescription refill requests, have your pharmacy contact our office and allow 72 hours for refills to be completed.    Today you received the following chemotherapy and/or immunotherapy agents: Velcade, Daratumumab and Hyaluronidase.       To help prevent nausea and vomiting after your treatment, we encourage you to take your nausea medication as directed.  BELOW ARE SYMPTOMS THAT SHOULD BE REPORTED IMMEDIATELY: *FEVER GREATER THAN 100.4 F (38 C) OR HIGHER *CHILLS OR SWEATING *NAUSEA AND VOMITING THAT IS NOT CONTROLLED WITH YOUR NAUSEA MEDICATION *UNUSUAL SHORTNESS OF BREATH *UNUSUAL BRUISING OR BLEEDING *URINARY PROBLEMS (pain or burning when urinating, or frequent urination) *BOWEL PROBLEMS (unusual diarrhea, constipation, pain near the anus) TENDERNESS IN MOUTH AND THROAT WITH OR WITHOUT PRESENCE OF ULCERS (sore throat, sores in mouth, or a toothache) UNUSUAL RASH, SWELLING OR PAIN  UNUSUAL VAGINAL DISCHARGE OR ITCHING   Items with * indicate a potential emergency and should be followed up as soon as possible or go to the Emergency Department if any problems should occur.  Please show the CHEMOTHERAPY ALERT CARD or  IMMUNOTHERAPY ALERT CARD at check-in to the Emergency Department and triage nurse.  Should you have questions after your visit or need to cancel or reschedule your appointment, please contact [facility]  Dept: [phone]  and follow the prompts.  Office hours are 8:00 a.m. to 4:30 p.m. Monday - Friday. Please note that voicemails left after 4:00 p.m. may not be returned until the following business day.  We are closed weekends and major holidays. You have access to a nurse at all times for urgent questions. Please call the main number to the clinic Dept: [phone] and follow the prompts.   For any non-urgent questions, you may also contact your provider using MyChart. We now offer e-Visits for anyone 74 and older to request care online for non-urgent symptoms. For details visit [email or web address].   Also download the MyChart app! Go to the app store, search "MyChart", open the app, select [facility], and log in with your MyChart username and password.  Masks are optional in the cancer centers. If you would like for your care team to wear a mask while they are taking care of you, please let them know. You may have one support person who is at least 65 years old accompany you for your appointments. Bortezomib Injection What is this medication? BORTEZOMIB (bor TEZ oh mib) treats lymphoma. It may also be used to treat multiple myeloma, a type of bone marrow cancer. It works by blocking a protein that causes cancer cells to grow and multiply. This helps to slow or stop the spread of cancer cells. This medicine may be used  for other purposes; ask your health care provider or pharmacist if you have questions. COMMON BRAND NAME(S): Velcade What should I tell my care team before I take this medication? They need to know if you have any of these conditions: Dehydration Diabetes Heart disease Liver disease Tingling of the fingers or toes or other nerve disorder An  unusual or allergic reaction to bortezomib, other medications, foods, dyes, or preservatives If you or your partner are pregnant or trying to get pregnant Breastfeeding How should I use this medication? This medication is injected into a vein or under the skin. It is given by your care team in a hospital or clinic setting. Talk to your care team about the use of this medication in children. Special care may be needed. Overdosage: If you think you have taken too much of this medicine contact a poison control center or emergency room at once. NOTE: This medicine is only for you. Do not share this medicine with others. What if I miss a dose? Keep appointments for follow-up doses. It is important not to miss your dose. Call your care team if you are unable to keep an appointment. What may interact with this medication? Ketoconazole Rifampin This list may not describe all possible interactions. Give your health care provider a list of all the medicines, herbs, non-prescription drugs, or dietary supplements you use. Also tell them if you smoke, drink alcohol, or use illegal drugs. Some items may interact with your medicine. What should I watch for while using this medication? Your condition will be monitored carefully while you are receiving this medication. You may need blood work while taking this medication. This medication may affect your coordination, reaction time, or judgment. Do not drive or operate machinery until you know how this medication affects you. Sit up or stand slowly to reduce the risk of dizzy or fainting spells. Drinking alcohol with this medication can increase the risk of these side effects. This medication may increase your risk of getting an infection. Call your care team for advice if you get a fever, chills, sore throat, or other symptoms of a cold or flu. Do not treat yourself. Try to avoid being around people who are sick. Check with your care team if you have severe  diarrhea, nausea, and vomiting, or if you sweat a lot. The loss of too much body fluid may make it dangerous for you to take this medication. Talk to your care team if you may be pregnant. Serious birth defects can occur if you take this medication during pregnancy and for 7 months after the last dose. You will need a negative pregnancy test before starting this medication. Contraception is recommended while taking this medication and for 7 months after the last dose. Your care team can help you find the option that works for you. If your partner can get pregnant, use a condom during sex while taking this medication and for 4 months after the last dose. Do not breastfeed while taking this medication and for 2 months after the last dose. This medication may cause infertility. Talk to your care team if you are concerned about your fertility. What side effects may I notice from receiving this medication? Side effects that you should report to your care team as soon as possible: Allergic reactions--skin rash, itching, hives, swelling of the face, lips, tongue, or throat Bleeding--bloody or black, tar-like stools, vomiting blood or brown material that looks like coffee grounds, red or dark brown urine, small red or  purple spots on skin, unusual bruising or bleeding Bleeding in the brain--severe headache, stiff neck, confusion, dizziness, change in vision, numbness or weakness of the face, arm, or leg, trouble speaking, trouble walking, vomiting Bowel blockage--stomach cramping, unable to have a bowel movement or pass gas, loss of appetite, vomiting Heart failure--shortness of breath, swelling of the ankles, feet, or hands, sudden weight gain, unusual weakness or fatigue Infection--fever, chills, cough, sore throat, wounds that don't heal, pain or trouble when passing urine, general feeling of discomfort or being unwell Liver injury--right upper belly pain, loss of appetite, nausea, light-colored stool, dark  yellow or brown urine, yellowing skin or eyes, unusual weakness or fatigue Low blood pressure--dizziness, feeling faint or lightheaded, blurry vision Lung injury--shortness of breath or trouble breathing, cough, spitting up blood, chest pain, fever Pain, tingling, or numbness in the hands or feet Severe or prolonged diarrhea Stomach pain, bloody diarrhea, pale skin, unusual weakness or fatigue, decrease in the amount of urine, which may be signs of hemolytic uremic syndrome Sudden and severe headache, confusion, change in vision, seizures, which may be signs of posterior reversible encephalopathy syndrome (PRES) TTP--purple spots on the skin or inside the mouth, pale skin, yellowing skin or eyes, unusual weakness or fatigue, fever, fast or irregular heartbeat, confusion, change in vision, trouble speaking, trouble walking Tumor lysis syndrome (TLS)--nausea, vomiting, diarrhea, decrease in the amount of urine, dark urine, unusual weakness or fatigue, confusion, muscle pain or cramps, fast or irregular heartbeat, joint pain Side effects that usually do not require medical attention (report to your care team if they continue or are bothersome): Constipation Diarrhea Fatigue Loss of appetite Nausea This list may not describe all possible side effects. Call your doctor for medical advice about side effects. You may report side effects to FDA at 1-800-FDA-1088. Where should I keep my medication? This medication is given in a hospital or clinic. It will not be stored at home. NOTE: This sheet is a summary. It may not cover all possible information. If you have questions about this medicine, talk to your doctor, pharmacist, or health care provider.  2023 Elsevier/Gold Standard (2022-02-21 00:00:00) Daratumumab; Hyaluronidase Injection What is this medication? DARATUMUMAB; HYALURONIDASE (dar a toom ue mab; hye al ur ON i dase) treats multiple myeloma, a type of bone marrow cancer. Daratumumab works by  blocking a protein that causes cancer cells to grow and multiply. This helps to slow or stop the spread of cancer cells. Hyaluronidase works by increasing the absorption of other medications in the body to help them work better. This medication may also be used treat amyloidosis, a condition that causes the buildup of a protein (amyloid) in your body. It works by reducing the buildup of this protein, which decreases symptoms. It is a combination medication that contains a monoclonal antibody. This medicine may be used for other purposes; ask your health care provider or pharmacist if you have questions. COMMON BRAND NAME(S): DARZALEX FASPRO What should I tell my care team before I take this medication? They need to know if you have any of these conditions: Heart disease Infection, such as chickenpox, cold sores, herpes, hepatitis B Lung or breathing disease An unusual or allergic reaction to daratumumab, hyaluronidase, other medications, foods, dyes, or preservatives Pregnant or trying to get pregnant Breast-feeding How should I use this medication? This medication is injected under the skin. It is given by your care team in a hospital or clinic setting. Talk to your care team about the use of this  medication in children. Special care may be needed. Overdosage: If you think you have taken too much of this medicine contact a poison control center or emergency room at once. NOTE: This medicine is only for you. Do not share this medicine with others. What if I miss a dose? Keep appointments for follow-up doses. It is important not to miss your dose. Call your care team if you are unable to keep an appointment. What may interact with this medication? Interactions have not been studied. This list may not describe all possible interactions. Give your health care provider a list of all the medicines, herbs, non-prescription drugs, or dietary supplements you use. Also tell them if you smoke, drink  alcohol, or use illegal drugs. Some items may interact with your medicine. What should I watch for while using this medication? Your condition will be monitored carefully while you are receiving this medication. This medication can cause serious allergic reactions. To reduce your risk, your care team may give you other medication to take before receiving this one. Be sure to follow the directions from your care team. This medication can affect the results of blood tests to match your blood type. These changes can last for up to 6 months after the final dose. Your care team will do blood tests to match your blood type before you start treatment. Tell all of your care team that you are being treated with this medication before receiving a blood transfusion. This medication can affect the results of some tests used to determine treatment response; extra tests may be needed to evaluate response. Talk to your care team if you wish to become pregnant or think you are pregnant. This medication can cause serious birth defects if taken during pregnancy and for 3 months after the last dose. A reliable form of contraception is recommended while taking this medication and for 3 months after the last dose. Talk to your care team about effective forms of contraception. Do not breast-feed while taking this medication. What side effects may I notice from receiving this medication? Side effects that you should report to your care team as soon as possible: Allergic reactions--skin rash, itching, hives, swelling of the face, lips, tongue, or throat Heart rhythm changes--fast or irregular heartbeat, dizziness, feeling faint or lightheaded, chest pain, trouble breathing Infection--fever, chills, cough, sore throat, wounds that don't heal, pain or trouble when passing urine, general feeling of discomfort or being unwell Infusion reactions--chest pain, shortness of breath or trouble breathing, feeling faint or  lightheaded Sudden eye pain or change in vision such as blurry vision, seeing halos around lights, vision loss Unusual bruising or bleeding Side effects that usually do not require medical attention (report to your care team if they continue or are bothersome): Constipation Diarrhea Fatigue Nausea Pain, tingling, or numbness in the hands or feet Swelling of the ankles, hands, or feet This list may not describe all possible side effects. Call your doctor for medical advice about side effects. You may report side effects to FDA at 1-800-FDA-1088. Where should I keep my medication? This medication is given in a hospital or clinic. It will not be stored at home. NOTE: This sheet is a summary. It may not cover all possible information. If you have questions about this medicine, talk to your doctor, pharmacist, or health care provider.  2023 Elsevier/Gold Standard (2022-01-17 00:00:00)

## 2022-05-15 NOTE — Progress Notes (Signed)
Pt monitored for 2 hours post first Velcade & Dara Faspro injection. No complaints. VSS. Injection sites clear at time of discharge.

## 2022-05-21 ENCOUNTER — Other Ambulatory Visit: Payer: Self-pay

## 2022-05-21 DIAGNOSIS — C9000 Multiple myeloma not having achieved remission: Secondary | ICD-10-CM

## 2022-05-22 ENCOUNTER — Inpatient Hospital Stay: Payer: Medicare Other

## 2022-05-22 ENCOUNTER — Encounter: Payer: Self-pay | Admitting: Nurse Practitioner

## 2022-05-22 ENCOUNTER — Other Ambulatory Visit: Payer: Self-pay

## 2022-05-22 ENCOUNTER — Inpatient Hospital Stay (HOSPITAL_BASED_OUTPATIENT_CLINIC_OR_DEPARTMENT_OTHER): Payer: Medicare Other | Admitting: Nurse Practitioner

## 2022-05-22 VITALS — BP 133/67 | HR 66 | Temp 98.2°F | Resp 16 | Ht 64.0 in | Wt 186.3 lb

## 2022-05-22 VITALS — BP 118/57 | HR 63 | Temp 98.2°F | Resp 16

## 2022-05-22 DIAGNOSIS — C9000 Multiple myeloma not having achieved remission: Secondary | ICD-10-CM | POA: Diagnosis not present

## 2022-05-22 DIAGNOSIS — Z5112 Encounter for antineoplastic immunotherapy: Secondary | ICD-10-CM | POA: Diagnosis not present

## 2022-05-22 DIAGNOSIS — D649 Anemia, unspecified: Secondary | ICD-10-CM

## 2022-05-22 LAB — CBC WITH DIFFERENTIAL/PLATELET
Abs Immature Granulocytes: 0.03 10*3/uL (ref 0.00–0.07)
Basophils Absolute: 0 10*3/uL (ref 0.0–0.1)
Basophils Relative: 0 %
Eosinophils Absolute: 0.1 10*3/uL (ref 0.0–0.5)
Eosinophils Relative: 3 %
HCT: 26.2 % — ABNORMAL LOW (ref 36.0–46.0)
Hemoglobin: 9 g/dL — ABNORMAL LOW (ref 12.0–15.0)
Immature Granulocytes: 1 %
Lymphocytes Relative: 46 %
Lymphs Abs: 1.1 10*3/uL (ref 0.7–4.0)
MCH: 33.7 pg (ref 26.0–34.0)
MCHC: 34.4 g/dL (ref 30.0–36.0)
MCV: 98.1 fL (ref 80.0–100.0)
Monocytes Absolute: 0.2 10*3/uL (ref 0.1–1.0)
Monocytes Relative: 8 %
Neutro Abs: 1 10*3/uL — ABNORMAL LOW (ref 1.7–7.7)
Neutrophils Relative %: 42 %
Platelets: 131 10*3/uL — ABNORMAL LOW (ref 150–400)
RBC: 2.67 MIL/uL — ABNORMAL LOW (ref 3.87–5.11)
RDW: 15.9 % — ABNORMAL HIGH (ref 11.5–15.5)
Smear Review: NORMAL
WBC: 2.4 10*3/uL — ABNORMAL LOW (ref 4.0–10.5)
nRBC: 4.6 % — ABNORMAL HIGH (ref 0.0–0.2)

## 2022-05-22 LAB — CMP (CANCER CENTER ONLY)
ALT: 48 U/L — ABNORMAL HIGH (ref 0–44)
AST: 25 U/L (ref 15–41)
Albumin: 3.2 g/dL — ABNORMAL LOW (ref 3.5–5.0)
Alkaline Phosphatase: 71 U/L (ref 38–126)
Anion gap: 4 — ABNORMAL LOW (ref 5–15)
BUN: 13 mg/dL (ref 8–23)
CO2: 24 mmol/L (ref 22–32)
Calcium: 8.8 mg/dL — ABNORMAL LOW (ref 8.9–10.3)
Chloride: 110 mmol/L (ref 98–111)
Creatinine: 0.82 mg/dL (ref 0.44–1.00)
GFR, Estimated: >60 mL/min (ref >60)
Glucose, Bld: 111 mg/dL — ABNORMAL HIGH (ref 70–99)
Potassium: 3.8 mmol/L (ref 3.5–5.1)
Sodium: 138 mmol/L (ref 135–145)
Total Bilirubin: 0.5 mg/dL (ref 0.3–1.2)
Total Protein: 9.4 g/dL — ABNORMAL HIGH (ref 6.5–8.1)

## 2022-05-22 MED ORDER — BORTEZOMIB CHEMO SQ INJECTION 3.5 MG (2.5MG/ML)
1.3000 mg/m2 | Freq: Once | INTRAMUSCULAR | Status: AC
  Administered 2022-05-22: 2.5 mg via SUBCUTANEOUS
  Filled 2022-05-22: qty 1

## 2022-05-22 MED ORDER — DIPHENHYDRAMINE HCL 25 MG PO CAPS
25.0000 mg | ORAL_CAPSULE | Freq: Once | ORAL | Status: AC
  Administered 2022-05-22: 25 mg via ORAL

## 2022-05-22 MED ORDER — DEXAMETHASONE 4 MG PO TABS
20.0000 mg | ORAL_TABLET | Freq: Once | ORAL | Status: AC
  Administered 2022-05-22: 20 mg via ORAL

## 2022-05-22 MED ORDER — ACETAMINOPHEN 325 MG PO TABS
650.0000 mg | ORAL_TABLET | Freq: Once | ORAL | Status: AC
  Administered 2022-05-22: 650 mg via ORAL

## 2022-05-22 MED ORDER — MONTELUKAST SODIUM 10 MG PO TABS
10.0000 mg | ORAL_TABLET | Freq: Once | ORAL | Status: AC
  Administered 2022-05-22: 10 mg via ORAL

## 2022-05-22 MED ORDER — DARATUMUMAB-HYALURONIDASE-FIHJ 1800-30000 MG-UT/15ML ~~LOC~~ SOLN
1800.0000 mg | Freq: Once | SUBCUTANEOUS | Status: AC
  Administered 2022-05-22: 1800 mg via SUBCUTANEOUS
  Filled 2022-05-22: qty 15

## 2022-05-22 NOTE — Progress Notes (Signed)
[facility]   Telephone:(336) [phone] Fax:(336) [phone]   Clinic Follow up Note   Patient Care Team: Unk Pinto, MD as PCP - General (Internal Medicine) Sable Feil, MD as Consulting Physician (Gastroenterology) Camillo Flaming, [place] as Referring Physician (Optometry) Fay Records, MD as Consulting Physician (Cardiology) Daryll Brod, MD as Consulting Physician (Orthopedic Surgery) [name], MD as Referring Physician (Hematology and Oncology) 05/22/2022  CHIEF COMPLAINT: Follow-up Multiple Myeloma  SUMMARY OF ONCOLOGIC HISTORY: Oncology History  Multiple myeloma ([place])  05/01/2022 Cancer Staging   Staging form: Plasma Cell Myeloma and Plasma Cell Disorders, AJCC 8th Edition - Clinical stage from 05/01/2022: Beta-2-microglobulin (mg/L): 3.2, Albumin (g/dL): 3.6, ISS: Stage I, High-risk cytogenetics: Unknown, LDH: Elevated - Signed by [name], MD on 05/08/2022 Stage prefix: Initial diagnosis Beta 2 microglobulin range (mg/L): Less than 3.5 Albumin range (g/dL): Greater than or equal to 3.5 Cytogenetics: Unknown   05/08/2022 Initial Diagnosis   Multiple myeloma ([place])   05/15/2022 -  Chemotherapy   Patient is on Treatment Plan : MYELOMA NEWLY DIAGNOSED TRANSPLANT CANDIDATE DaraVRd (Daratumumab IV) q21d x 6 Cycles (Induction/Consolidation)       CURRENT THERAPY: DRVd (Revlimid daily for 14 days then 7 days off; and Daratumumab, Velcade, and dexamethasone) weekly starting 05/15/2022  INTERVAL HISTORY: Alyssa Dudley returns for follow-up and treatment as scheduled, last seen by Dr. Annamaria Boots 05/08/2022.  She began Revlimid and DVd 05/15/2022.  She felt tired 2 days into treatment but otherwise doing okay.  Able to be out of bed and active, eating and drinking well.  Bowels moving.  She has a small rash at the right lower quadrant injection site, using Kenalog.  Denies pain, nausea, fever, chills, cough, chest pain, dyspnea, leg edema, bleeding, or any other new  specific complaints.  All other systems were reviewed with the patient and are negative.  MEDICAL HISTORY:  Past Medical History:  Diagnosis Date   Allergy    once a year    Hyperlipidemia    borderline- no meds    Pacemaker    Paroxysmal atrial fibrillation ([place]) 6/15   detected on ppm interrogation   PONV (postoperative nausea and vomiting)    has vomitted in past   Second degree Mobitz II AV block    Vitamin D deficiency     SURGICAL HISTORY: Past Surgical History:  Procedure Laterality Date   BREAST CYST EXCISION  1980   COLONOSCOPY     EXTRACORPOREAL SHOCK WAVE LITHOTRIPSY Right 12/28/2019   Procedure: RIGHT EXTRACORPOREAL SHOCK WAVE LITHOTRIPSY (ESWL);  Surgeon: Raynelle Bring, MD;  Location[facility];  Service: Urology;  Laterality: Right;   PERMANENT PACEMAKER INSERTION N/A 01/13/2013   SJM [employer] DR RF implanted by Dr [name] for mobitz II AV block   PLANTAR FASCIECTOMY     POLYPECTOMY     WRIST SURGERY Right 2011   removed cyst    I have reviewed the social history and family history with the patient and they are unchanged from previous note.  ALLERGIES:  has No Known Allergies.  MEDICATIONS:  Current Outpatient Medications  Medication Sig Dispense Refill   acyclovir (ZOVIRAX) 400 MG tablet Take 1 tablet (400 mg total) by mouth 2 (two) times daily. 60 tablet 2   aspirin 81 MG chewable tablet Chew by mouth daily.     atenolol (TENORMIN) 50 MG tablet Take  1 tablet  Daily  for BP 90 tablet 3   lenalidomide (REVLIMID) 25 MG capsule Take 1  capsule (25 mg total) by mouth daily. Take for 14 days on, 7 days off. Repeat every 21 days. [employer] Auth # [id number]     Date Obtained 05/09/2022 14 capsule 0   ondansetron (ZOFRAN) 8 MG tablet Take 1 tablet (8 mg total) by mouth every 8 (eight) hours as needed for nausea or vomiting. 30 tablet 1   triamcinolone cream (KENALOG) 0.1 % APPLY EXTERNALLY TO THE AFFECTED AREA THREE TIMES DAILY AS NEEDED FOR RASH 30 g 3    Vitamin D, Ergocalciferol, (DRISDOL) 1.25 MG (50000 UNIT) CAPS capsule TAKE 1 CAPSULE BY MOUTH TWICE WEEKLY 180 capsule 0   Current Facility-Administered Medications  Medication Dose Route Frequency Provider Last Rate Last Admin   0.9 %  sodium chloride infusion  500 mL Intravenous Once Pyrtle, Lajuan Lines, MD        PHYSICAL EXAMINATION: ECOG PERFORMANCE STATUS: 1 - Symptomatic but completely ambulatory  Vitals:   05/22/22 0850  BP: 133/67  Pulse: 66  Resp: 16  Temp: 98.2 F (36.8 C)  SpO2: 100%   Filed Weights   05/22/22 0850  Weight: 186 lb 4.8 oz (84.5 kg)    GENERAL:alert, no distress and comfortable SKIN: mild rash at RLQ injection site EYES:  sclera clear LUNGS: clear with normal breathing effort HEART: regular rate & rhythm, no lower extremity edema ABDOMEN:abdomen soft, non-tender and normal bowel sounds NEURO: alert & oriented x 3 with fluent speech, no focal motor/sensory deficits  LABORATORY DATA:  I have reviewed the data as listed    Latest Ref Rng & Units 05/22/2022    8:32 AM 05/15/2022    9:23 AM 05/01/2022    8:34 AM  CBC  WBC 4.0 - 10.5 K/uL 2.4  7.8  7.4   Hemoglobin 12.0 - 15.0 g/dL 9.0  9.5  9.9   Hematocrit 36.0 - 46.0 % 26.2  27.5  29.7   Platelets 150 - 400 K/uL 131  139  145         Latest Ref Rng & Units 05/22/2022    8:37 AM 05/15/2022    9:23 AM 05/01/2022    8:34 AM  CMP  Glucose 70 - 99 mg/dL 111  101  96   BUN 8 - 23 mg/dL '13  12  13   ' Creatinine 0.44 - 1.00 mg/dL 0.82  0.84  0.72   Sodium 135 - 145 mmol/L 138  136  136   Potassium 3.5 - 5.1 mmol/L 3.8  3.9  4.0   Chloride 98 - 111 mmol/L 110  106  108   CO2 22 - 32 mmol/L '24  27  25   ' Calcium 8.9 - 10.3 mg/dL 8.8  9.0  8.9   Total Protein 6.5 - 8.1 g/dL 9.4  10.9  10.4   Total Bilirubin 0.3 - 1.2 mg/dL 0.5  0.4  0.5   Alkaline Phos 38 - 126 U/L 71  68  67   AST 15 - 41 U/L '25  24  21   ' ALT 0 - 44 U/L 48  21  17       RADIOGRAPHIC STUDIES: I have personally reviewed the  radiological images as listed and agreed with the findings in the report. No results found.   ASSESSMENT & PLAN: Alyssa Dudley is a 66 y.o. female with    1. Multiple Myeloma, IgG lambda type, stage II -she presented with new onset anemia and proteinuria, no bone pain  -labs obtained on 04/24/22 showed significantly elevated  M protein at 4.5, IgG at 6,049, with significantly elevated lambda free light chain at 12942m/L, normal kappa light chain, 24hr urine obtained 04/26/22 showed significant elevation of total protein to 11,7136m24hr  -she underwent bone marrow biopsy on 05/01/22, pathology confirmed plasma cell myeloma (60% plasma cells). -she meets criteria of CRAB (anemia and bone lesions on PET), normal creatinine and calcium level -PET scan 05/07/22, showed: diffuse hypermetabolic bone marrow activity throughout skeleton. -She meets the diagnostic criteria for multiple myeloma.  I briefly reviewed her cytogenetics which shows a normal female karyotype and FISH panel which is consistent with standard risk multiple myeloma except the deletion of 1P and gain of 1 q. which is a higher risk feature.  She is being treated as high risk, this does not change her treatment plan -She began treatment with Revlimid daily x14 days (then 7 days off) and weekly DVd (dara, velcade, and dex) on 05/15/22 -Ms. JaHabermannppears stable. She tolerated cycle 1 day 1 DVd well and continues Revlimid. She had mild fatigue but maintains a good performance status.  -Labs reviewed, ANC 1.0, Hgb 9.0, plt 131. We reviewed precautions. She will be off Revlimid next week. Proceed with cycle 1 day 8 DVd (week 2) today as planned -[facility] -Return next week for cycle 1 day 15 (week 3), f/up in 2 weeks   2.  Normocytic hypoproductive anemia -routine CBC with her PCP on 04/05/22 showed hgb 11, it was normal last year. Repeat on 04/19/22 showed  further drop to 10.1, with iron 126 and ferritin 357.  No lab evidence of iron deficiency or B12 deficiency.  -most recent colonoscopy from 07/28/21 was benign, recall due 2027. POC cards obtained 04/20/22 were all negative.  -her anemia is secondary to MM and now treatment   3.  Atrial fibrillation, hypertension, pacemaker  -On daily aspirin  Plan: -Labs, FISH panel and cytogenetics reviewed -Proceed with cycle 1 day 8 (week 2) DVd, continue revlimid to complete 14 days, then 7 days off -[email or web address] -F/up in 2 weeks   All questions were answered. The patient knows to call the clinic with any problems, questions or concerns. No barriers to learning were detected. I spent 20 minutes counseling the patient face to face. The total time spent in the appointment was 30 minutes and more than 50% was on counseling and review of test results.     LaAlla FeelingNP 05/22/22

## 2022-05-22 NOTE — Patient Instructions (Addendum)
[facility]  Discharge Instructions: Thank you for choosing [facility] to provide your oncology and hematology care.   If you have a lab appointment with the Cancer Center, please go directly to the Cancer Center and check in at the registration area.   Wear comfortable clothing and clothing appropriate for easy access to any Portacath or PICC line.   We strive to give you quality time with your provider. You may need to reschedule your appointment if you arrive late (15 or more minutes).  Arriving late affects you and other patients whose appointments are after yours.  Also, if you miss three or more appointments without notifying the office, you may be dismissed from the clinic at the provider's discretion.      For prescription refill requests, have your pharmacy contact our office and allow 72 hours for refills to be completed.    Today you received the following chemotherapy and/or immunotherapy agents: Velcade/Darzalex Faspro      To help prevent nausea and vomiting after your treatment, we encourage you to take your nausea medication as directed.  BELOW ARE SYMPTOMS THAT SHOULD BE REPORTED IMMEDIATELY: *FEVER GREATER THAN 100.4 F (38 C) OR HIGHER *CHILLS OR SWEATING *NAUSEA AND VOMITING THAT IS NOT CONTROLLED WITH YOUR NAUSEA MEDICATION *UNUSUAL SHORTNESS OF BREATH *UNUSUAL BRUISING OR BLEEDING *URINARY PROBLEMS (pain or burning when urinating, or frequent urination) *BOWEL PROBLEMS (unusual diarrhea, constipation, pain near the anus) TENDERNESS IN MOUTH AND THROAT WITH OR WITHOUT PRESENCE OF ULCERS (sore throat, sores in mouth, or a toothache) UNUSUAL RASH, SWELLING OR PAIN  UNUSUAL VAGINAL DISCHARGE OR ITCHING   Items with * indicate a potential emergency and should be followed up as soon as possible or go to the Emergency Department if any problems should occur.  Please show the CHEMOTHERAPY ALERT CARD or IMMUNOTHERAPY ALERT CARD  at check-in to the Emergency Department and triage nurse.  Should you have questions after your visit or need to cancel or reschedule your appointment, please contact [facility]  Dept: [phone]  and follow the prompts.  Office hours are 8:00 a.m. to 4:30 p.m. Monday - Friday. Please note that voicemails left after 4:00 p.m. may not be returned until the following business day.  We are closed weekends and major holidays. You have access to a nurse at all times for urgent questions. Please call the main number to the clinic Dept: [phone] and follow the prompts.   For any non-urgent questions, you may also contact your provider using MyChart. We now offer e-Visits for anyone 18 and older to request care online for non-urgent symptoms. For details visit [email or web address].   Also download the MyChart app! Go to the app store, search "MyChart", open the app, select , and log in with your MyChart username and password.  Masks are optional in the cancer centers. If you would like for your care team to wear a mask while they are taking care of you, please let them know. You may have one support person who is at least 65 years old accompany you for your appointments. 

## 2022-05-22 NOTE — Progress Notes (Signed)
Pt observed for 60 minutes post second Darzalex Faspro injection. Pt tolerated trtmt well w/out incident. VSS at discharge.  Ambulatory to lobby w/ no complaints.  

## 2022-05-22 NOTE — Progress Notes (Signed)
Per Regan Rakers NP OK to trt today w/ ANC 1.0 K/uL and OK to proceed w/ premedications prior to CMP resulting today.

## 2022-05-23 LAB — KAPPA/LAMBDA LIGHT CHAINS
Kappa free light chain: 4.7 mg/L (ref 3.3–19.4)
Kappa, lambda light chain ratio: 0 — ABNORMAL LOW (ref 0.26–1.65)

## 2022-05-25 LAB — MULTIPLE MYELOMA PANEL, SERUM
Albumin SerPl Elph-Mcnc: 3.4 g/dL (ref 2.9–4.4)
Albumin/Glob SerPl: 0.7 (ref 0.7–1.7)
Alpha 1: 0.2 g/dL (ref 0.0–0.4)
Alpha2 Glob SerPl Elph-Mcnc: 0.7 g/dL (ref 0.4–1.0)
B-Globulin SerPl Elph-Mcnc: 1.1 g/dL (ref 0.7–1.3)
Gamma Glob SerPl Elph-Mcnc: 3.6 g/dL — ABNORMAL HIGH (ref 0.4–1.8)
Globulin, Total: 5.6 g/dL — ABNORMAL HIGH (ref 2.2–3.9)
IgA: 16 mg/dL — ABNORMAL LOW (ref 87–352)
IgG (Immunoglobin G), Serum: 4554 mg/dL — ABNORMAL HIGH (ref 586–1602)
IgM (Immunoglobulin M), Srm: 12 mg/dL — ABNORMAL LOW (ref 26–217)
M Protein SerPl Elph-Mcnc: 3.5 g/dL — ABNORMAL HIGH
Total Protein ELP: 9 g/dL — ABNORMAL HIGH (ref 6.0–8.5)

## 2022-05-28 ENCOUNTER — Other Ambulatory Visit: Payer: Self-pay

## 2022-05-28 ENCOUNTER — Inpatient Hospital Stay: Payer: Medicare Other

## 2022-05-28 ENCOUNTER — Other Ambulatory Visit: Payer: Self-pay | Admitting: Hematology and Oncology

## 2022-05-28 VITALS — BP 126/69 | HR 60 | Temp 98.2°F | Resp 18 | Wt 189.4 lb

## 2022-05-28 DIAGNOSIS — Z5112 Encounter for antineoplastic immunotherapy: Secondary | ICD-10-CM | POA: Diagnosis not present

## 2022-05-28 DIAGNOSIS — Z87442 Personal history of urinary calculi: Secondary | ICD-10-CM

## 2022-05-28 DIAGNOSIS — C9000 Multiple myeloma not having achieved remission: Secondary | ICD-10-CM

## 2022-05-28 LAB — CBC WITH DIFFERENTIAL (CANCER CENTER ONLY)
Abs Immature Granulocytes: 0.01 10*3/uL (ref 0.00–0.07)
Basophils Absolute: 0 10*3/uL (ref 0.0–0.1)
Basophils Relative: 0 %
Eosinophils Absolute: 0 10*3/uL (ref 0.0–0.5)
Eosinophils Relative: 2 %
HCT: 26 % — ABNORMAL LOW (ref 36.0–46.0)
Hemoglobin: 8.9 g/dL — ABNORMAL LOW (ref 12.0–15.0)
Immature Granulocytes: 0 %
Lymphocytes Relative: 56 %
Lymphs Abs: 1.4 10*3/uL (ref 0.7–4.0)
MCH: 33.5 pg (ref 26.0–34.0)
MCHC: 34.2 g/dL (ref 30.0–36.0)
MCV: 97.7 fL (ref 80.0–100.0)
Monocytes Absolute: 0.3 10*3/uL (ref 0.1–1.0)
Monocytes Relative: 12 %
Neutro Abs: 0.7 10*3/uL — ABNORMAL LOW (ref 1.7–7.7)
Neutrophils Relative %: 30 %
Platelet Count: 141 10*3/uL — ABNORMAL LOW (ref 150–400)
RBC: 2.66 MIL/uL — ABNORMAL LOW (ref 3.87–5.11)
RDW: 15.9 % — ABNORMAL HIGH (ref 11.5–15.5)
WBC Count: 2.4 10*3/uL — ABNORMAL LOW (ref 4.0–10.5)
nRBC: 2.1 % — ABNORMAL HIGH (ref 0.0–0.2)

## 2022-05-28 LAB — CMP (CANCER CENTER ONLY)
ALT: 40 U/L (ref 0–44)
AST: 18 U/L (ref 15–41)
Albumin: 3 g/dL — ABNORMAL LOW (ref 3.5–5.0)
Alkaline Phosphatase: 73 U/L (ref 38–126)
Anion gap: 3 — ABNORMAL LOW (ref 5–15)
BUN: 11 mg/dL (ref 8–23)
CO2: 25 mmol/L (ref 22–32)
Calcium: 8.5 mg/dL — ABNORMAL LOW (ref 8.9–10.3)
Chloride: 111 mmol/L (ref 98–111)
Creatinine: 0.75 mg/dL (ref 0.44–1.00)
GFR, Estimated: >60 mL/min (ref >60)
Glucose, Bld: 95 mg/dL (ref 70–99)
Potassium: 3.8 mmol/L (ref 3.5–5.1)
Sodium: 139 mmol/L (ref 135–145)
Total Bilirubin: 0.6 mg/dL (ref 0.3–1.2)
Total Protein: 8.3 g/dL — ABNORMAL HIGH (ref 6.5–8.1)

## 2022-05-28 MED ORDER — ACETAMINOPHEN 325 MG PO TABS
650.0000 mg | ORAL_TABLET | Freq: Once | ORAL | Status: AC
  Administered 2022-05-28: 650 mg via ORAL
  Filled 2022-05-28: qty 2

## 2022-05-28 MED ORDER — MONTELUKAST SODIUM 10 MG PO TABS
10.0000 mg | ORAL_TABLET | Freq: Once | ORAL | Status: AC
  Administered 2022-05-28: 10 mg via ORAL
  Filled 2022-05-28: qty 1

## 2022-05-28 MED ORDER — DIPHENHYDRAMINE HCL 25 MG PO CAPS
25.0000 mg | ORAL_CAPSULE | Freq: Once | ORAL | Status: AC
  Administered 2022-05-28: 25 mg via ORAL
  Filled 2022-05-28: qty 1

## 2022-05-28 MED ORDER — DEXAMETHASONE 4 MG PO TABS
20.0000 mg | ORAL_TABLET | Freq: Once | ORAL | Status: AC
  Administered 2022-05-28: 20 mg via ORAL
  Filled 2022-05-28: qty 5

## 2022-05-28 MED ORDER — DARATUMUMAB-HYALURONIDASE-FIHJ 1800-30000 MG-UT/15ML ~~LOC~~ SOLN
1800.0000 mg | Freq: Once | SUBCUTANEOUS | Status: AC
  Administered 2022-05-28: 1800 mg via SUBCUTANEOUS
  Filled 2022-05-28: qty 15

## 2022-05-28 NOTE — Patient Instructions (Signed)
[facility]  Discharge Instructions: Thank you for choosing [facility] to provide your oncology and hematology care.   If you have a lab appointment with the Cancer Center, please go directly to the Cancer Center and check in at the registration area.   Wear comfortable clothing and clothing appropriate for easy access to any Portacath or PICC line.   We strive to give you quality time with your provider. You may need to reschedule your appointment if you arrive late (15 or more minutes).  Arriving late affects you and other patients whose appointments are after yours.  Also, if you miss three or more appointments without notifying the office, you may be dismissed from the clinic at the provider's discretion.      For prescription refill requests, have your pharmacy contact our office and allow 72 hours for refills to be completed.    Today you received the following chemotherapy and/or immunotherapy agents: Daratumumab Faspro.      To help prevent nausea and vomiting after your treatment, we encourage you to take your nausea medication as directed.  BELOW ARE SYMPTOMS THAT SHOULD BE REPORTED IMMEDIATELY: *FEVER GREATER THAN 100.4 F (38 C) OR HIGHER *CHILLS OR SWEATING *NAUSEA AND VOMITING THAT IS NOT CONTROLLED WITH YOUR NAUSEA MEDICATION *UNUSUAL SHORTNESS OF BREATH *UNUSUAL BRUISING OR BLEEDING *URINARY PROBLEMS (pain or burning when urinating, or frequent urination) *BOWEL PROBLEMS (unusual diarrhea, constipation, pain near the anus) TENDERNESS IN MOUTH AND THROAT WITH OR WITHOUT PRESENCE OF ULCERS (sore throat, sores in mouth, or a toothache) UNUSUAL RASH, SWELLING OR PAIN  UNUSUAL VAGINAL DISCHARGE OR ITCHING   Items with * indicate a potential emergency and should be followed up as soon as possible or go to the Emergency Department if any problems should occur.  Please show the CHEMOTHERAPY ALERT CARD or IMMUNOTHERAPY ALERT CARD at  check-in to the Emergency Department and triage nurse.  Should you have questions after your visit or need to cancel or reschedule your appointment, please contact [facility]  Dept: [phone]  and follow the prompts.  Office hours are 8:00 a.m. to 4:30 p.m. Monday - Friday. Please note that voicemails left after 4:00 p.m. may not be returned until the following business day.  We are closed weekends and major holidays. You have access to a nurse at all times for urgent questions. Please call the main number to the clinic Dept: [phone] and follow the prompts.   For any non-urgent questions, you may also contact your provider using MyChart. We now offer e-Visits for anyone 18 and older to request care online for non-urgent symptoms. For details visit [email or web address].   Also download the MyChart app! Go to the app store, search "MyChart", open the app, select , and log in with your MyChart username and password.  Masks are optional in the cancer centers. If you would like for your care team to wear a mask while they are taking care of you, please let them know. You may have one support person who is at least 65 years old accompany you for your appointments. 

## 2022-05-28 NOTE — Progress Notes (Signed)
Per Alvy Bimler MD, pt ok to receive Dara injection with ANC 0.7. Pt advised by MD to hold Revlimid pill until repeat labs next week. Pt expressed understanding.   Per MD, ok to discharge without post-observation period due to lack of complications with previous injections.

## 2022-05-29 ENCOUNTER — Other Ambulatory Visit: Payer: Self-pay

## 2022-05-29 DIAGNOSIS — C9000 Multiple myeloma not having achieved remission: Secondary | ICD-10-CM

## 2022-05-29 MED ORDER — LENALIDOMIDE 25 MG PO CAPS: 25.0000 mg | ORAL_CAPSULE | Freq: Every day | ORAL | 0 refills | Status: DC

## 2022-06-04 ENCOUNTER — Inpatient Hospital Stay: Payer: Medicare Other

## 2022-06-04 ENCOUNTER — Encounter: Payer: Self-pay | Admitting: Hematology

## 2022-06-04 ENCOUNTER — Other Ambulatory Visit: Payer: Self-pay

## 2022-06-04 ENCOUNTER — Inpatient Hospital Stay (HOSPITAL_BASED_OUTPATIENT_CLINIC_OR_DEPARTMENT_OTHER): Payer: Medicare Other | Admitting: Hematology

## 2022-06-04 VITALS — BP 143/67 | HR 63 | Temp 98.4°F | Resp 18 | Ht 64.0 in | Wt 188.1 lb

## 2022-06-04 DIAGNOSIS — C9000 Multiple myeloma not having achieved remission: Secondary | ICD-10-CM

## 2022-06-04 DIAGNOSIS — D649 Anemia, unspecified: Secondary | ICD-10-CM

## 2022-06-04 DIAGNOSIS — Z5112 Encounter for antineoplastic immunotherapy: Secondary | ICD-10-CM | POA: Diagnosis not present

## 2022-06-04 LAB — CBC WITH DIFFERENTIAL (CANCER CENTER ONLY)
Abs Immature Granulocytes: 0.01 10*3/uL (ref 0.00–0.07)
Basophils Absolute: 0 10*3/uL (ref 0.0–0.1)
Basophils Relative: 1 %
Eosinophils Absolute: 0 10*3/uL (ref 0.0–0.5)
Eosinophils Relative: 1 %
HCT: 27 % — ABNORMAL LOW (ref 36.0–46.0)
Hemoglobin: 9 g/dL — ABNORMAL LOW (ref 12.0–15.0)
Immature Granulocytes: 0 %
Lymphocytes Relative: 69 %
Lymphs Abs: 2.2 10*3/uL (ref 0.7–4.0)
MCH: 32.7 pg (ref 26.0–34.0)
MCHC: 33.3 g/dL (ref 30.0–36.0)
MCV: 98.2 fL (ref 80.0–100.0)
Monocytes Absolute: 0.5 10*3/uL (ref 0.1–1.0)
Monocytes Relative: 14 %
Neutro Abs: 0.5 10*3/uL — ABNORMAL LOW (ref 1.7–7.7)
Neutrophils Relative %: 15 %
Platelet Count: 196 10*3/uL (ref 150–400)
RBC: 2.75 MIL/uL — ABNORMAL LOW (ref 3.87–5.11)
RDW: 16.4 % — ABNORMAL HIGH (ref 11.5–15.5)
WBC Count: 3.2 10*3/uL — ABNORMAL LOW (ref 4.0–10.5)
nRBC: 2.2 % — ABNORMAL HIGH (ref 0.0–0.2)

## 2022-06-04 LAB — COMPREHENSIVE METABOLIC PANEL
ALT: 29 U/L (ref 0–44)
AST: 16 U/L (ref 15–41)
Albumin: 3.5 g/dL (ref 3.5–5.0)
Alkaline Phosphatase: 90 U/L (ref 38–126)
Anion gap: 2 — ABNORMAL LOW (ref 5–15)
BUN: 8 mg/dL (ref 8–23)
CO2: 27 mmol/L (ref 22–32)
Calcium: 8.6 mg/dL — ABNORMAL LOW (ref 8.9–10.3)
Chloride: 110 mmol/L (ref 98–111)
Creatinine, Ser: 0.63 mg/dL (ref 0.44–1.00)
GFR, Estimated: >60 mL/min (ref >60)
Glucose, Bld: 97 mg/dL (ref 70–99)
Potassium: 4 mmol/L (ref 3.5–5.1)
Sodium: 139 mmol/L (ref 135–145)
Total Bilirubin: 0.5 mg/dL (ref 0.3–1.2)
Total Protein: 7.9 g/dL (ref 6.5–8.1)

## 2022-06-04 MED ORDER — ACETAMINOPHEN 325 MG PO TABS
650.0000 mg | ORAL_TABLET | Freq: Once | ORAL | Status: AC
  Administered 2022-06-04: 650 mg via ORAL
  Filled 2022-06-04: qty 2

## 2022-06-04 MED ORDER — DIPHENHYDRAMINE HCL 25 MG PO CAPS
25.0000 mg | ORAL_CAPSULE | Freq: Once | ORAL | Status: AC
  Administered 2022-06-04: 25 mg via ORAL
  Filled 2022-06-04: qty 1

## 2022-06-04 MED ORDER — DEXAMETHASONE 4 MG PO TABS
20.0000 mg | ORAL_TABLET | ORAL | 1 refills | Status: DC
Start: 2022-06-04 — End: 2022-07-17

## 2022-06-04 MED ORDER — SODIUM CHLORIDE 0.9 % IV SOLN: 16.0000 mg/kg | Freq: Once | INTRAVENOUS | Status: DC

## 2022-06-04 MED ORDER — DEXAMETHASONE 4 MG PO TABS
20.0000 mg | ORAL_TABLET | Freq: Once | ORAL | Status: AC
  Administered 2022-06-04: 20 mg via ORAL
  Filled 2022-06-04: qty 5

## 2022-06-04 MED ORDER — DARATUMUMAB-HYALURONIDASE-FIHJ 1800-30000 MG-UT/15ML ~~LOC~~ SOLN
1800.0000 mg | Freq: Once | SUBCUTANEOUS | Status: AC
  Administered 2022-06-04: 1800 mg via SUBCUTANEOUS
  Filled 2022-06-04: qty 15

## 2022-06-04 MED ORDER — BORTEZOMIB CHEMO SQ INJECTION 3.5 MG (2.5MG/ML)
1.3000 mg/m2 | Freq: Once | INTRAMUSCULAR | Status: AC
  Administered 2022-06-04: 2.5 mg via SUBCUTANEOUS
  Filled 2022-06-04: qty 1

## 2022-06-04 NOTE — Patient Instructions (Signed)
[facility]   Discharge Instructions: Thank you for choosing [facility] to provide your oncology and hematology care.   If you have a lab appointment with the [facility], please go directly to the [facility] and check in at the registration area.   Wear comfortable clothing and clothing appropriate for easy access to any Portacath or PICC line.   We strive to give you quality time with your provider. You may need to reschedule your appointment if you arrive late (15 or more minutes).  Arriving late affects you and other patients whose appointments are after yours.  Also, if you miss three or more appointments without notifying the office, you may be dismissed from the clinic at the provider's discretion.      For prescription refill requests, have your pharmacy contact our office and allow 72 hours for refills to be completed.    Today you received the following chemotherapy and/or immunotherapy agents: Bortezomib (Velcade) and Daratumumab hyaluronidase (Darzalex faspro)      To help prevent nausea and vomiting after your treatment, we encourage you to take your nausea medication as directed.  BELOW ARE SYMPTOMS THAT SHOULD BE REPORTED IMMEDIATELY: *FEVER GREATER THAN 100.4 F (38 C) OR HIGHER *CHILLS OR SWEATING *NAUSEA AND VOMITING THAT IS NOT CONTROLLED WITH YOUR NAUSEA MEDICATION *UNUSUAL SHORTNESS OF BREATH *UNUSUAL BRUISING OR BLEEDING *URINARY PROBLEMS (pain or burning when urinating, or frequent urination) *BOWEL PROBLEMS (unusual diarrhea, constipation, pain near the anus) TENDERNESS IN MOUTH AND THROAT WITH OR WITHOUT PRESENCE OF ULCERS (sore throat, sores in mouth, or a toothache) UNUSUAL RASH, SWELLING OR PAIN  UNUSUAL VAGINAL DISCHARGE OR ITCHING   Items with * indicate a potential emergency and should be followed up as soon as possible or go to the Emergency Department if any problems should occur.  Please show the  CHEMOTHERAPY ALERT CARD or IMMUNOTHERAPY ALERT CARD at check-in to the Emergency Department and triage nurse.  Should you have questions after your visit or need to cancel or reschedule your appointment, please contact [facility]  Dept: [phone]  and follow the prompts.  Office hours are 8:00 a.m. to 4:30 p.m. Monday - Friday. Please note that voicemails left after 4:00 p.m. may not be returned until the following business day.  We are closed weekends and major holidays. You have access to a nurse at all times for urgent questions. Please call the main number to the clinic Dept: [phone] and follow the prompts.   For any non-urgent questions, you may also contact your provider using MyChart. We now offer e-Visits for anyone 59 and older to request care online for non-urgent symptoms. For details visit [email or web address].   Also download the MyChart app! Go to the app store, search "MyChart", open the app, select [facility], and log in with your MyChart username and password.  Masks are optional in the cancer centers. If you would like for your care team to wear a mask while they are taking care of you, please let them know. You may have one support person who is at least 65 years old accompany you for your appointments.

## 2022-06-04 NOTE — Progress Notes (Addendum)
[facility]   Telephone:(336) [phone] Fax:(336) [phone]   Clinic Follow up Note   Patient Care Team: Unk Pinto, MD as PCP - General (Internal Medicine) Sable Feil, MD as Consulting Physician (Gastroenterology) Camillo Flaming, [place] as Referring Physician (Optometry) Fay Records, MD as Consulting Physician (Cardiology) Daryll Brod, MD as Consulting Physician (Orthopedic Surgery) [name], MD as Referring Physician (Hematology and Oncology)  Date of Service:  06/04/2022  CHIEF COMPLAINT: f/u of multiple myeloma  CURRENT THERAPY:  DRVD (daratumumab, Revlimid, Velcade, and dexamethasone), starting 05/15/22  -Revlimid dose: 40m days 1-14 every 21 days, dose reduce to 233mfrom cycle 3 due to neutropenia   -DVd weekly  ASSESSMENT & PLAN:  Alyssa Dudley a [age].o. female with   1. Multiple Myeloma, IgG lambda type, stage II -she presented with new onset anemia and proteinuria, no bone pain  -labs obtained on 04/24/22 showed significantly elevated M protein at 4.5, IgG at 6,049, with significantly elevated lambda free light chain at 1291343m, normal kappa light chain, 24hr urine obtained 04/26/22 showed significant elevation of total protein to 11,710m45mhr  -she underwent bone marrow biopsy on 05/01/22, pathology confirmed plasma cell myeloma (60% plasma cells). -she meets criteria of CRAB (anemia and bone lesions on PET), normal creatinine and calcium level -PET scan performed yesterday, 05/07/22, showed: diffuse hypermetabolic bone marrow activity throughout skeleton. -cytogenetics show normal karyotype and FISH panel, and deletion of 1P and gain of 1 q. which is a higher risk feature. I reviewed the results with pt  -she began Revlimid daily x14 days (then 7 days off) and weekly DVd (dara, velcade, and dex) on 05/15/22. She tolerated well overall with very mild side effects. -she saw Dr. LambAris Lot8/22/23 to discuss bone marrow transplant. She  is felt to be a good candidate. -labs reviewed, hgb stable at 9 on treatment, WBC 3.2 but ANC 0.5. will postpone C2  Revlimid from tomorrow to 9/4, and reduce dose to 20mg15mly from cycle 3    2.  Normocytic hypoproductive anemia -routine CBC with her PCP on 04/05/22 showed hgb 11, it was normal last year. Repeat on 04/19/22 showed further drop to 10.1, with iron 126 and ferritin 357.  No lab evidence of iron deficiency or B12 deficiency.  -most recent colonoscopy from 07/28/21 was benign, recall due 2027. POC cards obtained 04/20/22 were all negative.  -her anemia is secondary to MM   3.  Atrial fibrillation, hypertension, status post pacemaker placement      PLAN:  -proceed with DVD today -Postpone cycle 2 revlimid 25mg 57my day 1-14 start date from tomorrow to 9/4, and plan to reduce dose to 20mg f45mcycle 3  -lab and DVD weekly -f/u every 2 weeks   No problem-specific Assessment & Plan notes found for this encounter.   SUMMARY OF ONCOLOGIC HISTORY: Oncology History  Multiple myeloma (HCC)  7[place]/2023 Cancer Staging   Staging form: Plasma Cell Myeloma and Plasma Cell Disorders, AJCC 8th Edition - Clinical stage from 05/01/2022: RISS Stage II (Beta-2-microglobulin (mg/L): 3.2, Albumin (g/dL): 3.6, ISS: Stage I, High-risk cytogenetics: Absent, LDH: Elevated) - Signed by Cranston Koors, YTruitt Merle 06/03/2022 Stage prefix: Initial diagnosis Beta 2 microglobulin range (mg/L): Less than 3.5 Albumin range (g/dL): Greater than or equal to 3.5 Cytogenetics: 1p deletion   05/08/2022 Initial Diagnosis   Multiple myeloma (HCC)   [place]/2023 -  Chemotherapy   Patient is on Treatment Plan : MYELOMAWarrenumumab  IV) q21d x 6 Cycles (Induction/Consolidation)     05/15/2022 - 05/28/2022 Chemotherapy   Patient is on Treatment Plan : MYELOMA NEWLY DIAGNOSED TRANSPLANT CANDIDATE DaraVRd (Daratumumab IV) q21d x 6 Cycles (Induction/Consolidation)        INTERVAL  HISTORY:  Lennan Bacigalupo is here for a follow up of MM. She was last seen by NP Lacie on 05/22/22. She presents to the clinic accompanied by her husband. She reports she is doing well overall.   All other systems were reviewed with the patient and are negative.  MEDICAL HISTORY:  Past Medical History:  Diagnosis Date   Allergy    once a year    Hyperlipidemia    borderline- no meds    Pacemaker    Paroxysmal atrial fibrillation ([place]) 6/15   detected on ppm interrogation   PONV (postoperative nausea and vomiting)    has vomitted in past   Second degree Mobitz II AV block    Vitamin D deficiency     SURGICAL HISTORY: Past Surgical History:  Procedure Laterality Date   BREAST CYST EXCISION  1980   COLONOSCOPY     EXTRACORPOREAL SHOCK WAVE LITHOTRIPSY Right 12/28/2019   Procedure: RIGHT EXTRACORPOREAL SHOCK WAVE LITHOTRIPSY (ESWL);  Surgeon: Raynelle Bring, MD;  [facility]: [facility];  Service: Urology;  Laterality: Right;   PERMANENT PACEMAKER INSERTION N/A 01/13/2013   SJM Accent DR RF implanted by Dr [name] for mobitz II AV block   PLANTAR FASCIECTOMY     POLYPECTOMY     WRIST SURGERY Right 2011   removed cyst    I have reviewed the social history and family history with the patient and they are unchanged from previous note.  ALLERGIES:  has No Known Allergies.  MEDICATIONS:  Current Outpatient Medications  Medication Sig Dispense Refill   dexamethasone (DECADRON) 4 MG tablet Take 5 tablets (20 mg total) by mouth once a week. 1-2 hours before chemo injections weekly 30 tablet 1   acyclovir (ZOVIRAX) 400 MG tablet Take 1 tablet (400 mg total) by mouth 2 (two) times daily. 60 tablet 2   aspirin 81 MG chewable tablet Chew by mouth daily.     atenolol (TENORMIN) 50 MG tablet Take  1 tablet  Daily  for BP 90 tablet 3   lenalidomide (REVLIMID) 25 MG capsule Take 1 capsule (25 mg total) by mouth daily. Take for 14 days on, 7 days off. Repeat every 21 days. [employer]  Auth # [id number]     Date Obtained 05/29/2022 14 capsule 0   triamcinolone cream (KENALOG) 0.1 % APPLY EXTERNALLY TO THE AFFECTED AREA THREE TIMES DAILY AS NEEDED FOR RASH 30 g 3   Vitamin D, Ergocalciferol, (DRISDOL) 1.25 MG (50000 UNIT) CAPS capsule TAKE 1 CAPSULE BY MOUTH TWICE WEEKLY 180 capsule 0   Current Facility-Administered Medications  Medication Dose Route Frequency Provider Last Rate Last Admin   0.9 %  sodium chloride infusion  500 mL Intravenous Once Pyrtle, Lajuan Lines, MD        PHYSICAL EXAMINATION: ECOG PERFORMANCE STATUS: 0 - Asymptomatic  Vitals:   06/04/22 1216  BP: (!) 143/67  Pulse: 63  Resp: 18  Temp: 98.4 F (36.9 C)  SpO2: 100%   Wt Readings from Last 3 Encounters:  06/04/22 188 lb 1.6 oz (85.3 kg)  05/28/22 189 lb 6.4 oz (85.9 kg)  05/22/22 186 lb 4.8 oz (84.5 kg)     GENERAL:alert, no distress and comfortable SKIN: skin color normal, no rashes or  significant lesions EYES: normal, Conjunctiva are pink and non-injected, sclera clear  NEURO: alert & oriented x 3 with fluent speech  LABORATORY DATA:  I have reviewed the data as listed    Latest Ref Rng & Units 06/04/2022   11:52 AM 05/28/2022    8:22 AM 05/22/2022    8:32 AM  CBC  WBC 4.0 - 10.5 K/uL 3.2  2.4  2.4   Hemoglobin 12.0 - 15.0 g/dL 9.0  8.9  9.0   Hematocrit 36.0 - 46.0 % 27.0  26.0  26.2   Platelets 150 - 400 K/uL 196  141  131         Latest Ref Rng & Units 06/04/2022   11:52 AM 05/28/2022    8:22 AM 05/22/2022    8:37 AM  CMP  Glucose 70 - 99 mg/dL 97  95  111   BUN 8 - 23 mg/dL _0 Creatinine 0.44 - 1.00 mg/dL 0.63  0.75  0.82   Sodium 135 - 145 mmol/L 139  139  138   Potassium 3.5 - 5.1 mmol/L 4.0  3.8  3.8   Chloride 98 - 111 mmol/L 110  111  110   CO2 22 - 32 mmol/L _1 Calcium 8.9 - 10.3 mg/dL 8.6  8.5  8.8   Total Protein 6.5 - 8.1 g/dL 7.9  8.3  9.4   Total Bilirubin 0.3 - 1.2 mg/dL 0.5  0.6  0.5   Alkaline Phos 38 - 126 U/L 90  73  71   AST 15 - 41 U/L  _2 ALT 0 - 44 U/L 29  40  48       RADIOGRAPHIC STUDIES: I have personally reviewed the radiological images as listed and agreed with the findings in the report. No results found.    Orders Placed This Encounter  Procedures   CBC with Differential ([facility] Only)    Standing Status:   Future    Standing Expiration Date:   06/05/2023   CMP ([facility] only)    Standing Status:   Future    Standing Expiration Date:   06/05/2023   CBC with Differential ([facility] Only)    Standing Status:   Future    Standing Expiration Date:   06/13/2023   CMP ([facility] only)    Standing Status:   Future    Standing Expiration Date:   06/13/2023   CBC with Differential ([facility] Only)    Standing Status:   Future    Standing Expiration Date:   06/19/2023   CBC with Differential ([facility] Only)    Standing Status:   Future    Standing Expiration Date:   06/26/2023   CMP ([facility] only)    Standing Status:   Future    Standing Expiration Date:   06/26/2023   CBC with Differential ([facility] Only)    Standing Status:   Future    Standing Expiration Date:   07/03/2023   CMP ([facility] only)    Standing Status:   Future    Standing Expiration Date:   07/03/2023   CBC with Differential ([facility] Only)    Standing Status:   Future    Standing Expiration Date:   07/10/2023   All questions were answered. The patient knows to call the clinic with any problems, questions or concerns. No barriers to learning was  detected. The total time spent in the appointment was 30 minutes.     [name], MD 06/04/2022   I, Wilburn Mylar, am acting as scribe for [name], MD.   I have reviewed the above documentation for accuracy and completeness, and I agree with the above.

## 2022-06-04 NOTE — Progress Notes (Signed)
Per Dr. Burr Medico - okay to treat with ANC of 0.5. Patient is afebrile at this time.

## 2022-06-06 ENCOUNTER — Other Ambulatory Visit: Payer: Self-pay | Admitting: Hematology

## 2022-06-12 ENCOUNTER — Inpatient Hospital Stay: Payer: Medicare Other

## 2022-06-12 ENCOUNTER — Inpatient Hospital Stay: Payer: Medicare Other | Attending: Hematology

## 2022-06-12 ENCOUNTER — Other Ambulatory Visit: Payer: Self-pay

## 2022-06-12 VITALS — BP 146/87 | HR 77 | Temp 98.6°F | Resp 18 | Wt 186.8 lb

## 2022-06-12 DIAGNOSIS — Z79899 Other long term (current) drug therapy: Secondary | ICD-10-CM | POA: Diagnosis not present

## 2022-06-12 DIAGNOSIS — C9000 Multiple myeloma not having achieved remission: Secondary | ICD-10-CM | POA: Diagnosis not present

## 2022-06-12 DIAGNOSIS — D630 Anemia in neoplastic disease: Secondary | ICD-10-CM | POA: Diagnosis not present

## 2022-06-12 DIAGNOSIS — Z5112 Encounter for antineoplastic immunotherapy: Secondary | ICD-10-CM | POA: Insufficient documentation

## 2022-06-12 DIAGNOSIS — D649 Anemia, unspecified: Secondary | ICD-10-CM

## 2022-06-12 LAB — CMP (CANCER CENTER ONLY)
ALT: 14 U/L (ref 0–44)
AST: 13 U/L — ABNORMAL LOW (ref 15–41)
Albumin: 3.6 g/dL (ref 3.5–5.0)
Alkaline Phosphatase: 94 U/L (ref 38–126)
Anion gap: 5 (ref 5–15)
BUN: 10 mg/dL (ref 8–23)
CO2: 25 mmol/L (ref 22–32)
Calcium: 8.8 mg/dL — ABNORMAL LOW (ref 8.9–10.3)
Chloride: 108 mmol/L (ref 98–111)
Creatinine: 0.71 mg/dL (ref 0.44–1.00)
GFR, Estimated: >60 mL/min (ref >60)
Glucose, Bld: 97 mg/dL (ref 70–99)
Potassium: 3.7 mmol/L (ref 3.5–5.1)
Sodium: 138 mmol/L (ref 135–145)
Total Bilirubin: 0.6 mg/dL (ref 0.3–1.2)
Total Protein: 8 g/dL (ref 6.5–8.1)

## 2022-06-12 LAB — CBC WITH DIFFERENTIAL (CANCER CENTER ONLY)
Abs Immature Granulocytes: 0.02 10*3/uL (ref 0.00–0.07)
Basophils Absolute: 0 10*3/uL (ref 0.0–0.1)
Basophils Relative: 0 %
Eosinophils Absolute: 0 10*3/uL (ref 0.0–0.5)
Eosinophils Relative: 1 %
HCT: 28.7 % — ABNORMAL LOW (ref 36.0–46.0)
Hemoglobin: 9.6 g/dL — ABNORMAL LOW (ref 12.0–15.0)
Immature Granulocytes: 1 %
Lymphocytes Relative: 52 %
Lymphs Abs: 1.8 10*3/uL (ref 0.7–4.0)
MCH: 32.9 pg (ref 26.0–34.0)
MCHC: 33.4 g/dL (ref 30.0–36.0)
MCV: 98.3 fL (ref 80.0–100.0)
Monocytes Absolute: 0.4 10*3/uL (ref 0.1–1.0)
Monocytes Relative: 11 %
Neutro Abs: 1.2 10*3/uL — ABNORMAL LOW (ref 1.7–7.7)
Neutrophils Relative %: 35 %
Platelet Count: 186 10*3/uL (ref 150–400)
RBC: 2.92 MIL/uL — ABNORMAL LOW (ref 3.87–5.11)
RDW: 15.9 % — ABNORMAL HIGH (ref 11.5–15.5)
WBC Count: 3.5 10*3/uL — ABNORMAL LOW (ref 4.0–10.5)
nRBC: 0.9 % — ABNORMAL HIGH (ref 0.0–0.2)

## 2022-06-12 MED ORDER — BORTEZOMIB CHEMO SQ INJECTION 3.5 MG (2.5MG/ML)
1.3000 mg/m2 | Freq: Once | INTRAMUSCULAR | Status: AC
  Administered 2022-06-12: 2.5 mg via SUBCUTANEOUS
  Filled 2022-06-12: qty 1

## 2022-06-12 MED ORDER — DARATUMUMAB-HYALURONIDASE-FIHJ 1800-30000 MG-UT/15ML ~~LOC~~ SOLN
1800.0000 mg | Freq: Once | SUBCUTANEOUS | Status: AC
  Administered 2022-06-12: 1800 mg via SUBCUTANEOUS
  Filled 2022-06-12: qty 15

## 2022-06-12 MED ORDER — SODIUM CHLORIDE 0.9 % IV SOLN: Freq: Once | INTRAVENOUS | Status: DC

## 2022-06-12 NOTE — Progress Notes (Signed)
Pt states she took Benadryl, Tylenol, and Decadron prior to arriving to clinic today (estimated to be taken around 0750).   Pt reports mild irritation and occasional bleeding in gums for which she has consulted her dentist.

## 2022-06-12 NOTE — Patient Instructions (Signed)
[facility]  Discharge Instructions: Thank you for choosing [facility] to provide your oncology and hematology care.   If you have a lab appointment with the Cancer Center, please go directly to the Cancer Center and check in at the registration area.   Wear comfortable clothing and clothing appropriate for easy access to any Portacath or PICC line.   We strive to give you quality time with your provider. You may need to reschedule your appointment if you arrive late (15 or more minutes).  Arriving late affects you and other patients whose appointments are after yours.  Also, if you miss three or more appointments without notifying the office, you may be dismissed from the clinic at the provider's discretion.      For prescription refill requests, have your pharmacy contact our office and allow 72 hours for refills to be completed.    Today you received the following chemotherapy and/or immunotherapy agents: Velcade, Dara Faspro.       To help prevent nausea and vomiting after your treatment, we encourage you to take your nausea medication as directed.  BELOW ARE SYMPTOMS THAT SHOULD BE REPORTED IMMEDIATELY: *FEVER GREATER THAN 100.4 F (38 C) OR HIGHER *CHILLS OR SWEATING *NAUSEA AND VOMITING THAT IS NOT CONTROLLED WITH YOUR NAUSEA MEDICATION *UNUSUAL SHORTNESS OF BREATH *UNUSUAL BRUISING OR BLEEDING *URINARY PROBLEMS (pain or burning when urinating, or frequent urination) *BOWEL PROBLEMS (unusual diarrhea, constipation, pain near the anus) TENDERNESS IN MOUTH AND THROAT WITH OR WITHOUT PRESENCE OF ULCERS (sore throat, sores in mouth, or a toothache) UNUSUAL RASH, SWELLING OR PAIN  UNUSUAL VAGINAL DISCHARGE OR ITCHING   Items with * indicate a potential emergency and should be followed up as soon as possible or go to the Emergency Department if any problems should occur.  Please show the CHEMOTHERAPY ALERT CARD or IMMUNOTHERAPY ALERT CARD at  check-in to the Emergency Department and triage nurse.  Should you have questions after your visit or need to cancel or reschedule your appointment, please contact [facility]  Dept: [phone]  and follow the prompts.  Office hours are 8:00 a.m. to 4:30 p.m. Monday - Friday. Please note that voicemails left after 4:00 p.m. may not be returned until the following business day.  We are closed weekends and major holidays. You have access to a nurse at all times for urgent questions. Please call the main number to the clinic Dept: [phone] and follow the prompts.   For any non-urgent questions, you may also contact your provider using MyChart. We now offer e-Visits for anyone 18 and older to request care online for non-urgent symptoms. For details visit [email or web address].   Also download the MyChart app! Go to the app store, search "MyChart", open the app, select , and log in with your MyChart username and password.  Masks are optional in the cancer centers. If you would like for your care team to wear a mask while they are taking care of you, please let them know. You may have one support person who is at least 65 years old accompany you for your appointments. 

## 2022-06-13 LAB — KAPPA/LAMBDA LIGHT CHAINS
Kappa free light chain: 3 mg/L — ABNORMAL LOW (ref 3.3–19.4)
Kappa, lambda light chain ratio: 0 — ABNORMAL LOW (ref 0.26–1.65)
Lambda free light chains: 6326.4 mg/L — ABNORMAL HIGH (ref 5.7–26.3)

## 2022-06-13 NOTE — Progress Notes (Signed)
Remote pacemaker transmission.   

## 2022-06-18 ENCOUNTER — Other Ambulatory Visit: Payer: Self-pay

## 2022-06-18 ENCOUNTER — Other Ambulatory Visit (HOSPITAL_COMMUNITY): Payer: Self-pay

## 2022-06-18 ENCOUNTER — Inpatient Hospital Stay: Payer: Medicare Other

## 2022-06-18 ENCOUNTER — Encounter: Payer: Self-pay | Admitting: Hematology

## 2022-06-18 ENCOUNTER — Inpatient Hospital Stay (HOSPITAL_BASED_OUTPATIENT_CLINIC_OR_DEPARTMENT_OTHER): Payer: Medicare Other | Admitting: Hematology

## 2022-06-18 VITALS — BP 128/64 | HR 69 | Temp 98.3°F | Resp 18 | Ht 64.0 in | Wt 187.5 lb

## 2022-06-18 DIAGNOSIS — Z5112 Encounter for antineoplastic immunotherapy: Secondary | ICD-10-CM | POA: Diagnosis not present

## 2022-06-18 DIAGNOSIS — C9000 Multiple myeloma not having achieved remission: Secondary | ICD-10-CM

## 2022-06-18 LAB — CMP (CANCER CENTER ONLY)
ALT: 14 U/L (ref 0–44)
AST: 13 U/L — ABNORMAL LOW (ref 15–41)
Albumin: 3.7 g/dL (ref 3.5–5.0)
Alkaline Phosphatase: 82 U/L (ref 38–126)
Anion gap: 3 — ABNORMAL LOW (ref 5–15)
BUN: 10 mg/dL (ref 8–23)
CO2: 30 mmol/L (ref 22–32)
Calcium: 9.3 mg/dL (ref 8.9–10.3)
Chloride: 107 mmol/L (ref 98–111)
Creatinine: 0.74 mg/dL (ref 0.44–1.00)
GFR, Estimated: >60 mL/min (ref >60)
Glucose, Bld: 117 mg/dL — ABNORMAL HIGH (ref 70–99)
Potassium: 4 mmol/L (ref 3.5–5.1)
Sodium: 140 mmol/L (ref 135–145)
Total Bilirubin: 0.6 mg/dL (ref 0.3–1.2)
Total Protein: 7.5 g/dL (ref 6.5–8.1)

## 2022-06-18 LAB — CBC WITH DIFFERENTIAL (CANCER CENTER ONLY)
Abs Immature Granulocytes: 0.01 10*3/uL (ref 0.00–0.07)
Basophils Absolute: 0 10*3/uL (ref 0.0–0.1)
Basophils Relative: 0 %
Eosinophils Absolute: 0.1 10*3/uL (ref 0.0–0.5)
Eosinophils Relative: 2 %
HCT: 30.3 % — ABNORMAL LOW (ref 36.0–46.0)
Hemoglobin: 10 g/dL — ABNORMAL LOW (ref 12.0–15.0)
Immature Granulocytes: 0 %
Lymphocytes Relative: 50 %
Lymphs Abs: 1.4 10*3/uL (ref 0.7–4.0)
MCH: 32.9 pg (ref 26.0–34.0)
MCHC: 33 g/dL (ref 30.0–36.0)
MCV: 99.7 fL (ref 80.0–100.0)
Monocytes Absolute: 0.2 10*3/uL (ref 0.1–1.0)
Monocytes Relative: 8 %
Neutro Abs: 1.2 10*3/uL — ABNORMAL LOW (ref 1.7–7.7)
Neutrophils Relative %: 40 %
Platelet Count: 158 10*3/uL (ref 150–400)
RBC: 3.04 MIL/uL — ABNORMAL LOW (ref 3.87–5.11)
RDW: 15.7 % — ABNORMAL HIGH (ref 11.5–15.5)
WBC Count: 2.9 10*3/uL — ABNORMAL LOW (ref 4.0–10.5)
nRBC: 0 % (ref 0.0–0.2)

## 2022-06-18 LAB — MULTIPLE MYELOMA PANEL, SERUM
Albumin SerPl Elph-Mcnc: 3.1 g/dL (ref 2.9–4.4)
Albumin/Glob SerPl: 0.8 (ref 0.7–1.7)
Alpha 1: 0.2 g/dL (ref 0.0–0.4)
Alpha2 Glob SerPl Elph-Mcnc: 0.8 g/dL (ref 0.4–1.0)
B-Globulin SerPl Elph-Mcnc: 1 g/dL (ref 0.7–1.3)
Gamma Glob SerPl Elph-Mcnc: 2.3 g/dL — ABNORMAL HIGH (ref 0.4–1.8)
Globulin, Total: 4.3 g/dL — ABNORMAL HIGH (ref 2.2–3.9)
IgA: 10 mg/dL — ABNORMAL LOW (ref 87–352)
IgG (Immunoglobin G), Serum: 2865 mg/dL — ABNORMAL HIGH (ref 586–1602)
IgM (Immunoglobulin M), Srm: 11 mg/dL — ABNORMAL LOW (ref 26–217)
M Protein SerPl Elph-Mcnc: 2.2 g/dL — ABNORMAL HIGH
Total Protein ELP: 7.4 g/dL (ref 6.0–8.5)

## 2022-06-18 MED ORDER — TRIAMCINOLONE ACETONIDE 0.1 % EX CREA: TOPICAL_CREAM | CUTANEOUS | 2 refills | Status: DC

## 2022-06-18 MED ORDER — BORTEZOMIB CHEMO SQ INJECTION 3.5 MG (2.5MG/ML)
1.3000 mg/m2 | Freq: Once | INTRAMUSCULAR | Status: AC
  Administered 2022-06-18: 2.5 mg via SUBCUTANEOUS
  Filled 2022-06-18: qty 1

## 2022-06-18 MED ORDER — DARATUMUMAB-HYALURONIDASE-FIHJ 1800-30000 MG-UT/15ML ~~LOC~~ SOLN
1800.0000 mg | Freq: Once | SUBCUTANEOUS | Status: AC
  Administered 2022-06-18: 1800 mg via SUBCUTANEOUS
  Filled 2022-06-18: qty 15

## 2022-06-18 MED ORDER — LENALIDOMIDE 10 MG PO CAPS: 20.0000 mg | ORAL_CAPSULE | Freq: Every day | ORAL | 0 refills | Status: DC

## 2022-06-18 NOTE — Patient Instructions (Signed)
[facility] ONCOLOGY  Discharge Instructions: Thank you for choosing [facility] to provide your oncology and hematology care.   If you have a lab appointment with the [facility], please go directly to the [facility] and check in at the registration area.   Wear comfortable clothing and clothing appropriate for easy access to any Portacath or PICC line.   We strive to give you quality time with your provider. You may need to reschedule your appointment if you arrive late (15 or more minutes).  Arriving late affects you and other patients whose appointments are after yours.  Also, if you miss three or more appointments without notifying the office, you may be dismissed from the clinic at the provider's discretion.      For prescription refill requests, have your pharmacy contact our office and allow 72 hours for refills to be completed.    Today you received the following chemotherapy and/or immunotherapy agents: daratumumab-hyaluronidase, bortezomib      To help prevent nausea and vomiting after your treatment, we encourage you to take your nausea medication as directed.  BELOW ARE SYMPTOMS THAT SHOULD BE REPORTED IMMEDIATELY: *FEVER GREATER THAN 100.4 F (38 C) OR HIGHER *CHILLS OR SWEATING *NAUSEA AND VOMITING THAT IS NOT CONTROLLED WITH YOUR NAUSEA MEDICATION *UNUSUAL SHORTNESS OF BREATH *UNUSUAL BRUISING OR BLEEDING *URINARY PROBLEMS (pain or burning when urinating, or frequent urination) *BOWEL PROBLEMS (unusual diarrhea, constipation, pain near the anus) TENDERNESS IN MOUTH AND THROAT WITH OR WITHOUT PRESENCE OF ULCERS (sore throat, sores in mouth, or a toothache) UNUSUAL RASH, SWELLING OR PAIN  UNUSUAL VAGINAL DISCHARGE OR ITCHING   Items with * indicate a potential emergency and should be followed up as soon as possible or go to the Emergency Department if any problems should occur.  Please show the CHEMOTHERAPY ALERT CARD or  IMMUNOTHERAPY ALERT CARD at check-in to the Emergency Department and triage nurse.  Should you have questions after your visit or need to cancel or reschedule your appointment, please contact [facility]  Dept: [phone]  and follow the prompts.  Office hours are 8:00 a.m. to 4:30 p.m. Monday - Friday. Please note that voicemails left after 4:00 p.m. may not be returned until the following business day.  We are closed weekends and major holidays. You have access to a nurse at all times for urgent questions. Please call the main number to the clinic Dept: [phone] and follow the prompts.   For any non-urgent questions, you may also contact your provider using MyChart. We now offer e-Visits for anyone 29 and older to request care online for non-urgent symptoms. For details visit [email or web address].   Also download the MyChart app! Go to the app store, search "MyChart", open the app, select [facility], and log in with your MyChart username and password.  Masks are optional in the cancer centers. If you would like for your care team to wear a mask while they are taking care of you, please let them know. You may have one support person who is at least 65 years old accompany you for your appointments.

## 2022-06-18 NOTE — Progress Notes (Signed)
[facility]   Telephone:(336) [phone] Fax:(336) [phone]   Clinic Follow up Note   Patient Care Team: Unk Pinto, MD as PCP - General (Internal Medicine) Sable Feil, MD as Consulting Physician (Gastroenterology) Camillo Flaming, [place] as Referring Physician (Optometry) Fay Records, MD as Consulting Physician (Cardiology) Daryll Brod, MD as Consulting Physician (Orthopedic Surgery) [name], MD as Referring Physician (Hematology and Oncology)  Date of Service:  06/18/2022  CHIEF COMPLAINT: f/u of multiple myeloma  CURRENT THERAPY:  DRVD (daratumumab, Revlimid, Velcade, and dexamethasone), starting 05/15/22             -Revlimid dose: 40m days 1-14 every 21 days, dose reduce to 251mfrom cycle 3 due to neutropenia              -DVd weekly  ASSESSMENT & PLAN:  Alyssa Dudley a [age].o. female with   1. Multiple Myeloma, IgG lambda type, stage II, high risk  -she presented with new onset anemia and proteinuria, no bone pain  -labs obtained on 04/24/22 showed significantly elevated M protein at 4.5, IgG at 6,049, with significantly elevated lambda free light chain at 129137m, normal kappa light chain, 24hr urine obtained 04/26/22 showed significant elevation of total protein to 11,710m22mhr  -bone marrow biopsy on 05/01/22 confirmed plasma cell myeloma (60% plasma cells). -she meets criteria of CRAB (anemia and bone lesions on PET), normal creatinine and calcium level -PET scan performed 05/07/22, showed: diffuse hypermetabolic bone marrow activity throughout skeleton. -cytogenetics show normal karyotype and FISH panel, and deletion of 1P and gain of 1 q. which is a higher risk feature.  -she began Revlimid daily x14 days (then 7 days off) and weekly DVd (dara, velcade, and dex) on 05/15/22. She tolerated well overall with very mild side effects. -she saw Dr. LambAris Lot8/22/23 to discuss bone marrow transplant. She is felt to be a good  candidate. -light chain labs from 06/12/22 reviewed, her lambda light chain has decreased by half on treatment. MM panel is still pending.  -Labs from today also reviewed, CBC shows ANC 1.2 (improved),  hgb 10. Plan to decrease her revlimid dose to 20mg33mly from C3 due to neutropenia, I called in today   2.  Normocytic hypoproductive anemia -routine CBC with her PCP on 04/05/22 showed hgb 11, it was normal last year. Repeat on 04/19/22 showed further drop to 10.1, with iron 126 and ferritin 357.  No lab evidence of iron deficiency or B12 deficiency.  -most recent colonoscopy from 07/28/21 was benign, recall due 2027. POC cards obtained 04/20/22 were all negative.  -her anemia is secondary to MM   3.  Atrial fibrillation, hypertension, status post pacemaker placement      PLAN:  -proceed with DVD today -continue revlimid 25mg 40my for one more week then off for a week. Plan to reduce dose to 20mg f80mcycle 3 which she will start in 2 weeks -lab and DVD weekly -f/u every 2 weeks   No problem-specific Assessment & Plan notes found for this encounter.   SUMMARY OF ONCOLOGIC HISTORY: Oncology History  Multiple myeloma (HCC)  [place]/2023 Cancer Staging   Staging form: Plasma Cell Myeloma and Plasma Cell Disorders, AJCC 8th Edition - Clinical stage from 05/01/2022: RISS Stage II (Beta-2-microglobulin (mg/L): 3.2, Albumin (g/dL): 3.6, ISS: Stage I, High-risk cytogenetics: Absent, LDH: Elevated) - Signed by Jaree Trinka, YTruitt Merle 06/03/2022 Stage prefix: Initial diagnosis Beta 2 microglobulin range (mg/L): Less than 3.5 Albumin range (g/dL):  Greater than or equal to 3.5 Cytogenetics: 1p deletion   05/08/2022 Initial Diagnosis   Multiple myeloma ([facility])   05/14/2022 -  Chemotherapy   Patient is on Treatment Plan : MYELOMA NEWLY DIAGNOSED TRANSPLANT CANDIDATE DaraVRd (Daratumumab IV) q21d x 6 Cycles (Induction/Consolidation)     05/15/2022 - 05/28/2022 Chemotherapy   Patient is on Treatment Plan : MYELOMA  NEWLY DIAGNOSED TRANSPLANT CANDIDATE DaraVRd (Daratumumab IV) q21d x 6 Cycles (Induction/Consolidation)        INTERVAL HISTORY:  Alyssa Dudley is here for a follow up of MM. She was last seen by me on 06/04/22. She presents to the clinic accompanied by her husband. She reports she is doing well overall. She tells me she is paying a lot of money for the revlimid. She explains she doesn't feel she will qualify for any financial assistance.   All other systems were reviewed with the patient and are negative.  MEDICAL HISTORY:  Past Medical History:  Diagnosis Date   Allergy    once a year    Hyperlipidemia    borderline- no meds    Pacemaker    Paroxysmal atrial fibrillation ([facility]) 6/15   detected on ppm interrogation   PONV (postoperative nausea and vomiting)    has vomitted in past   Second degree Mobitz II AV block    Vitamin D deficiency     SURGICAL HISTORY: Past Surgical History:  Procedure Laterality Date   BREAST CYST EXCISION  1980   COLONOSCOPY     EXTRACORPOREAL SHOCK WAVE LITHOTRIPSY Right 12/28/2019   Procedure: RIGHT EXTRACORPOREAL SHOCK WAVE LITHOTRIPSY (ESWL);  Surgeon: Raynelle Bring, MD;  Location: [facility];  Service: Urology;  Laterality: Right;   PERMANENT PACEMAKER INSERTION N/A 01/13/2013   SJM Acc[employer] DR RF implanted by Dr [name] for mobitz II AV block   PLANTAR FASCIECTOMY     POLYPECTOMY     WRIST SURGERY Right 2011   removed cyst    I have reviewed the social history and family history with the patient and they are unchanged from previous note.  ALLERGIES:  has No Known Allergies.  MEDICATIONS:  Current Outpatient Medications  Medication Sig Dispense Refill   acyclovir (ZOVIRAX) 400 MG tablet Take 1 tablet (400 mg total) by mouth 2 (two) times daily. 60 tablet 2   aspirin 81 MG chewable tablet Chew by mouth daily.     atenolol (TENORMIN) 50 MG tablet Take  1 tablet  Daily  for BP 90 tablet 3   dexamethasone (DECADRON) 4 MG  tablet Take 5 tablets (20 mg total) by mouth once a week. 1-2 hours before chemo injections weekly 30 tablet 1   lenalidomide (REVLIMID) 10 MG capsule Take 2 capsules (20 mg total) by mouth daily. Take 2 capsules (9m total) by mouth daily for day 1-14  every 21 days. [employer] Auth # [id number]    Date Obtained 06/18/2022 28 capsule 0   triamcinolone cream (KENALOG) 0.1 % APPLY EXTERNALLY TO THE AFFECTED AREA THREE TIMES DAILY AS NEEDED FOR RASH 30 g 2   Vitamin D, Ergocalciferol, (DRISDOL) 1.25 MG (50000 UNIT) CAPS capsule TAKE 1 CAPSULE BY MOUTH TWICE WEEKLY 180 capsule 0   Current Facility-Administered Medications  Medication Dose Route Frequency Provider Last Rate Last Admin   0.9 %  sodium chloride infusion  500 mL Intravenous Once Pyrtle, JLajuan Lines MD        PHYSICAL EXAMINATION: ECOG PERFORMANCE STATUS: 1 - Symptomatic but completely ambulatory  Vitals:  06/18/22 0926  BP: 128/64  Pulse: 69  Resp: 18  Temp: 98.3 F (36.8 C)  SpO2: 100%   Wt Readings from Last 3 Encounters:  06/18/22 187 lb 8 oz (85 kg)  06/12/22 186 lb 12.8 oz (84.7 kg)  06/04/22 188 lb 1.6 oz (85.3 kg)     GENERAL:alert, no distress and comfortable SKIN: skin color normal, no rashes or significant lesions EYES: normal, Conjunctiva are pink and non-injected, sclera clear  NEURO: alert & oriented x 3 with fluent speech  LABORATORY DATA:  I have reviewed the data as listed    Latest Ref Rng & Units 06/18/2022    9:11 AM 06/12/2022    7:58 AM 06/04/2022   11:52 AM  CBC  WBC 4.0 - 10.5 K/uL 2.9  3.5  3.2   Hemoglobin 12.0 - 15.0 g/dL 10.0  9.6  9.0   Hematocrit 36.0 - 46.0 % 30.3  28.7  27.0   Platelets 150 - 400 K/uL 158  186  196         Latest Ref Rng & Units 06/18/2022    9:11 AM 06/12/2022    7:58 AM 06/04/2022   11:52 AM  CMP  Glucose 70 - 99 mg/dL 117  97  97   BUN 8 - 23 mg/dL '10  10  8   ' Creatinine 0.44 - 1.00 mg/dL 0.74  0.71  0.63   Sodium 135 - 145 mmol/L 140  138  139   Potassium 3.5 -  5.1 mmol/L 4.0  3.7  4.0   Chloride 98 - 111 mmol/L 107  108  110   CO2 22 - 32 mmol/L '30  25  27   ' Calcium 8.9 - 10.3 mg/dL 9.3  8.8  8.6   Total Protein 6.5 - 8.1 g/dL 7.5  8.0  7.9   Total Bilirubin 0.3 - 1.2 mg/dL 0.6  0.6  0.5   Alkaline Phos 38 - 126 U/L 82  94  90   AST 15 - 41 U/L '13  13  16   ' ALT 0 - 44 U/L '14  14  29       ' RADIOGRAPHIC STUDIES: I have personally reviewed the radiological images as listed and agreed with the findings in the report. No results found.    Orders Placed This Encounter  Procedures   CBC with Differential ([facility] Only)    Standing Status:   Future    Standing Expiration Date:   07/10/2023   CMP ([facility] only)    Standing Status:   Future    Standing Expiration Date:   07/10/2023   CBC with Differential ([facility] Only)    Standing Status:   Future    Standing Expiration Date:   07/17/2023   CMP ([facility] only)    Standing Status:   Future    Standing Expiration Date:   07/17/2023   CBC with Differential ([facility] Only)    Standing Status:   Future    Standing Expiration Date:   07/24/2023   CMP ([facility] only)    Standing Status:   Future    Standing Expiration Date:   07/24/2023   CBC with Differential ([facility] Only)    Standing Status:   Future    Standing Expiration Date:   07/31/2023   CMP ([facility] only)    Standing Status:   Future    Standing Expiration Date:   07/31/2023   All questions were answered.  The patient knows to call the clinic with any problems, questions or concerns. No barriers to learning was detected. The total time spent in the appointment was 30 minutes.     [name], MD 06/18/2022   I, Wilburn Mylar, am acting as scribe for [name], MD.   I have reviewed the above documentation for accuracy and completeness, and I agree with the above.

## 2022-06-19 ENCOUNTER — Other Ambulatory Visit: Payer: Self-pay

## 2022-06-26 ENCOUNTER — Other Ambulatory Visit: Payer: Self-pay

## 2022-06-26 ENCOUNTER — Inpatient Hospital Stay: Payer: Medicare Other

## 2022-06-26 VITALS — BP 142/58 | HR 80 | Temp 98.2°F | Resp 18 | Wt 187.8 lb

## 2022-06-26 DIAGNOSIS — Z5112 Encounter for antineoplastic immunotherapy: Secondary | ICD-10-CM | POA: Diagnosis not present

## 2022-06-26 DIAGNOSIS — C9000 Multiple myeloma not having achieved remission: Secondary | ICD-10-CM

## 2022-06-26 DIAGNOSIS — D649 Anemia, unspecified: Secondary | ICD-10-CM

## 2022-06-26 LAB — CBC WITH DIFFERENTIAL (CANCER CENTER ONLY)
Abs Immature Granulocytes: 0 10*3/uL (ref 0.00–0.07)
Basophils Absolute: 0 10*3/uL (ref 0.0–0.1)
Basophils Relative: 1 %
Eosinophils Absolute: 0.1 10*3/uL (ref 0.0–0.5)
Eosinophils Relative: 3 %
HCT: 29.4 % — ABNORMAL LOW (ref 36.0–46.0)
Hemoglobin: 9.9 g/dL — ABNORMAL LOW (ref 12.0–15.0)
Immature Granulocytes: 0 %
Lymphocytes Relative: 51 %
Lymphs Abs: 2.2 10*3/uL (ref 0.7–4.0)
MCH: 33 pg (ref 26.0–34.0)
MCHC: 33.7 g/dL (ref 30.0–36.0)
MCV: 98 fL (ref 80.0–100.0)
Monocytes Absolute: 0.7 10*3/uL (ref 0.1–1.0)
Monocytes Relative: 16 %
Neutro Abs: 1.3 10*3/uL — ABNORMAL LOW (ref 1.7–7.7)
Neutrophils Relative %: 29 %
Platelet Count: 171 10*3/uL (ref 150–400)
RBC: 3 MIL/uL — ABNORMAL LOW (ref 3.87–5.11)
RDW: 15.4 % (ref 11.5–15.5)
WBC Count: 4.4 10*3/uL (ref 4.0–10.5)
nRBC: 0 % (ref 0.0–0.2)

## 2022-06-26 LAB — CMP (CANCER CENTER ONLY)
ALT: 17 U/L (ref 0–44)
AST: 13 U/L — ABNORMAL LOW (ref 15–41)
Albumin: 3.7 g/dL (ref 3.5–5.0)
Alkaline Phosphatase: 91 U/L (ref 38–126)
Anion gap: 5 (ref 5–15)
BUN: 11 mg/dL (ref 8–23)
CO2: 27 mmol/L (ref 22–32)
Calcium: 8.7 mg/dL — ABNORMAL LOW (ref 8.9–10.3)
Chloride: 110 mmol/L (ref 98–111)
Creatinine: 0.63 mg/dL (ref 0.44–1.00)
GFR, Estimated: >60 mL/min (ref >60)
Glucose, Bld: 100 mg/dL — ABNORMAL HIGH (ref 70–99)
Potassium: 3.8 mmol/L (ref 3.5–5.1)
Sodium: 142 mmol/L (ref 135–145)
Total Bilirubin: 0.5 mg/dL (ref 0.3–1.2)
Total Protein: 6.8 g/dL (ref 6.5–8.1)

## 2022-06-26 MED ORDER — DARATUMUMAB-HYALURONIDASE-FIHJ 1800-30000 MG-UT/15ML ~~LOC~~ SOLN
1800.0000 mg | Freq: Once | SUBCUTANEOUS | Status: AC
  Administered 2022-06-26: 1800 mg via SUBCUTANEOUS
  Filled 2022-06-26: qty 15

## 2022-06-26 MED ORDER — BORTEZOMIB CHEMO SQ INJECTION 3.5 MG (2.5MG/ML)
1.3000 mg/m2 | Freq: Once | INTRAMUSCULAR | Status: AC
  Administered 2022-06-26: 2.5 mg via SUBCUTANEOUS
  Filled 2022-06-26: qty 1

## 2022-06-26 NOTE — Patient Instructions (Signed)
[facility] ONCOLOGY  Discharge Instructions: Thank you for choosing [facility] to provide your oncology and hematology care.   If you have a lab appointment with the [facility], please go directly to the [facility] and check in at the registration area.   Wear comfortable clothing and clothing appropriate for easy access to any Portacath or PICC line.   We strive to give you quality time with your provider. You may need to reschedule your appointment if you arrive late (15 or more minutes).  Arriving late affects you and other patients whose appointments are after yours.  Also, if you miss three or more appointments without notifying the office, you may be dismissed from the clinic at the provider's discretion.      For prescription refill requests, have your pharmacy contact our office and allow 72 hours for refills to be completed.    Today you received the following chemotherapy and/or immunotherapy agents: bortezomib, Daratumumab       To help prevent nausea and vomiting after your treatment, we encourage you to take your nausea medication as directed.  BELOW ARE SYMPTOMS THAT SHOULD BE REPORTED IMMEDIATELY: *FEVER GREATER THAN 100.4 F (38 C) OR HIGHER *CHILLS OR SWEATING *NAUSEA AND VOMITING THAT IS NOT CONTROLLED WITH YOUR NAUSEA MEDICATION *UNUSUAL SHORTNESS OF BREATH *UNUSUAL BRUISING OR BLEEDING *URINARY PROBLEMS (pain or burning when urinating, or frequent urination) *BOWEL PROBLEMS (unusual diarrhea, constipation, pain near the anus) TENDERNESS IN MOUTH AND THROAT WITH OR WITHOUT PRESENCE OF ULCERS (sore throat, sores in mouth, or a toothache) UNUSUAL RASH, SWELLING OR PAIN  UNUSUAL VAGINAL DISCHARGE OR ITCHING   Items with * indicate a potential emergency and should be followed up as soon as possible or go to the Emergency Department if any problems should occur.  Please show the CHEMOTHERAPY ALERT CARD or IMMUNOTHERAPY ALERT CARD  at check-in to the Emergency Department and triage nurse.  Should you have questions after your visit or need to cancel or reschedule your appointment, please contact [facility]  Dept: [phone]  and follow the prompts.  Office hours are 8:00 a.m. to 4:30 p.m. Monday - Friday. Please note that voicemails left after 4:00 p.m. may not be returned until the following business day.  We are closed weekends and major holidays. You have access to a nurse at all times for urgent questions. Please call the main number to the clinic Dept: [phone] and follow the prompts.   For any non-urgent questions, you may also contact your provider using MyChart. We now offer e-Visits for anyone 1 and older to request care online for non-urgent symptoms. For details visit [email or web address].   Also download the MyChart app! Go to the app store, search "MyChart", open the app, select [facility], and log in with your MyChart username and password.  Masks are optional in the cancer centers. If you would like for your care team to wear a mask while they are taking care of you, please let them know. You may have one support person who is at least 65 years old accompany you for your appointments.

## 2022-06-26 NOTE — Progress Notes (Signed)
Per Dr. Burr Medico, okay to proceed with ANC of 1.3 today. Patient took her pre-medications at home at 22.

## 2022-06-27 LAB — KAPPA/LAMBDA LIGHT CHAINS
Kappa free light chain: 3.1 mg/L — ABNORMAL LOW (ref 3.3–19.4)
Kappa, lambda light chain ratio: 0 — ABNORMAL LOW (ref 0.26–1.65)
Lambda free light chains: 2566.7 mg/L — ABNORMAL HIGH (ref 5.7–26.3)

## 2022-07-02 ENCOUNTER — Inpatient Hospital Stay: Payer: Medicare Other

## 2022-07-02 ENCOUNTER — Inpatient Hospital Stay (HOSPITAL_BASED_OUTPATIENT_CLINIC_OR_DEPARTMENT_OTHER): Payer: Medicare Other | Admitting: Hematology

## 2022-07-02 ENCOUNTER — Other Ambulatory Visit: Payer: Self-pay

## 2022-07-02 ENCOUNTER — Encounter: Payer: Self-pay | Admitting: Hematology

## 2022-07-02 VITALS — BP 143/73 | HR 69 | Temp 98.3°F | Resp 18 | Ht 64.0 in | Wt 188.5 lb

## 2022-07-02 DIAGNOSIS — C9000 Multiple myeloma not having achieved remission: Secondary | ICD-10-CM

## 2022-07-02 DIAGNOSIS — Z5112 Encounter for antineoplastic immunotherapy: Secondary | ICD-10-CM | POA: Diagnosis not present

## 2022-07-02 LAB — MULTIPLE MYELOMA PANEL, SERUM
Albumin SerPl Elph-Mcnc: 3.3 g/dL (ref 2.9–4.4)
Albumin/Glob SerPl: 1.1 (ref 0.7–1.7)
Alpha 1: 0.4 g/dL (ref 0.0–0.4)
Alpha2 Glob SerPl Elph-Mcnc: 0.6 g/dL (ref 0.4–1.0)
B-Globulin SerPl Elph-Mcnc: 0.9 g/dL (ref 0.7–1.3)
Gamma Glob SerPl Elph-Mcnc: 1.4 g/dL (ref 0.4–1.8)
Globulin, Total: 3.3 g/dL (ref 2.2–3.9)
IgA: 7 mg/dL — ABNORMAL LOW (ref 87–352)
IgG (Immunoglobin G), Serum: 1699 mg/dL — ABNORMAL HIGH (ref 586–1602)
IgM (Immunoglobulin M), Srm: 9 mg/dL — ABNORMAL LOW (ref 26–217)
M Protein SerPl Elph-Mcnc: 1.2 g/dL — ABNORMAL HIGH
Total Protein ELP: 6.6 g/dL (ref 6.0–8.5)

## 2022-07-02 LAB — CMP (CANCER CENTER ONLY)
ALT: 17 U/L (ref 0–44)
AST: 11 U/L — ABNORMAL LOW (ref 15–41)
Albumin: 3.7 g/dL (ref 3.5–5.0)
Alkaline Phosphatase: 91 U/L (ref 38–126)
Anion gap: 3 — ABNORMAL LOW (ref 5–15)
BUN: 13 mg/dL (ref 8–23)
CO2: 28 mmol/L (ref 22–32)
Calcium: 8.7 mg/dL — ABNORMAL LOW (ref 8.9–10.3)
Chloride: 110 mmol/L (ref 98–111)
Creatinine: 0.68 mg/dL (ref 0.44–1.00)
GFR, Estimated: >60 mL/min (ref >60)
Glucose, Bld: 100 mg/dL — ABNORMAL HIGH (ref 70–99)
Potassium: 4.3 mmol/L (ref 3.5–5.1)
Sodium: 141 mmol/L (ref 135–145)
Total Bilirubin: 0.6 mg/dL (ref 0.3–1.2)
Total Protein: 6.8 g/dL (ref 6.5–8.1)

## 2022-07-02 LAB — CBC WITH DIFFERENTIAL (CANCER CENTER ONLY)
Abs Immature Granulocytes: 0 10*3/uL (ref 0.00–0.07)
Basophils Absolute: 0 10*3/uL (ref 0.0–0.1)
Basophils Relative: 0 %
Eosinophils Absolute: 0.1 10*3/uL (ref 0.0–0.5)
Eosinophils Relative: 3 %
HCT: 29.9 % — ABNORMAL LOW (ref 36.0–46.0)
Hemoglobin: 10.3 g/dL — ABNORMAL LOW (ref 12.0–15.0)
Immature Granulocytes: 0 %
Lymphocytes Relative: 59 %
Lymphs Abs: 2.2 10*3/uL (ref 0.7–4.0)
MCH: 33.6 pg (ref 26.0–34.0)
MCHC: 34.4 g/dL (ref 30.0–36.0)
MCV: 97.4 fL (ref 80.0–100.0)
Monocytes Absolute: 0.5 10*3/uL (ref 0.1–1.0)
Monocytes Relative: 14 %
Neutro Abs: 0.9 10*3/uL — ABNORMAL LOW (ref 1.7–7.7)
Neutrophils Relative %: 24 %
Platelet Count: 238 10*3/uL (ref 150–400)
RBC: 3.07 MIL/uL — ABNORMAL LOW (ref 3.87–5.11)
RDW: 15.7 % — ABNORMAL HIGH (ref 11.5–15.5)
WBC Count: 3.8 10*3/uL — ABNORMAL LOW (ref 4.0–10.5)
nRBC: 0 % (ref 0.0–0.2)

## 2022-07-02 MED ORDER — BORTEZOMIB CHEMO SQ INJECTION 3.5 MG (2.5MG/ML)
1.3000 mg/m2 | Freq: Once | INTRAMUSCULAR | Status: AC
  Administered 2022-07-02: 2.5 mg via SUBCUTANEOUS
  Filled 2022-07-02: qty 1

## 2022-07-02 MED ORDER — DARATUMUMAB-HYALURONIDASE-FIHJ 1800-30000 MG-UT/15ML ~~LOC~~ SOLN
1800.0000 mg | Freq: Once | SUBCUTANEOUS | Status: AC
  Administered 2022-07-02: 1800 mg via SUBCUTANEOUS
  Filled 2022-07-02: qty 15

## 2022-07-02 NOTE — Patient Instructions (Signed)
[facility]  Discharge Instructions: Thank you for choosing [facility] to provide your oncology and hematology care.   If you have a lab appointment with the [facility], please go directly to the [facility] and check in at the registration area.   Wear comfortable clothing and clothing appropriate for easy access to any Portacath or PICC line.   We strive to give you quality time with your provider. You may need to reschedule your appointment if you arrive late (15 or more minutes).  Arriving late affects you and other patients whose appointments are after yours.  Also, if you miss three or more appointments without notifying the office, you may be dismissed from the clinic at the provider's discretion.      For prescription refill requests, have your pharmacy contact our office and allow 72 hours for refills to be completed.    Today you received the following chemotherapy and/or immunotherapy agents: bortezomib, Daratumumab       To help prevent nausea and vomiting after your treatment, we encourage you to take your nausea medication as directed.  BELOW ARE SYMPTOMS THAT SHOULD BE REPORTED IMMEDIATELY: *FEVER GREATER THAN 100.4 F (38 C) OR HIGHER *CHILLS OR SWEATING *NAUSEA AND VOMITING THAT IS NOT CONTROLLED WITH YOUR NAUSEA MEDICATION *UNUSUAL SHORTNESS OF BREATH *UNUSUAL BRUISING OR BLEEDING *URINARY PROBLEMS (pain or burning when urinating, or frequent urination) *BOWEL PROBLEMS (unusual diarrhea, constipation, pain near the anus) TENDERNESS IN MOUTH AND THROAT WITH OR WITHOUT PRESENCE OF ULCERS (sore throat, sores in mouth, or a toothache) UNUSUAL RASH, SWELLING OR PAIN  UNUSUAL VAGINAL DISCHARGE OR ITCHING   Items with * indicate a potential emergency and should be followed up as soon as possible or go to the Emergency Department if any problems should occur.  Please show the CHEMOTHERAPY ALERT CARD or IMMUNOTHERAPY ALERT CARD  at check-in to the Emergency Department and triage nurse.  Should you have questions after your visit or need to cancel or reschedule your appointment, please contact [facility]  Dept: [phone]  and follow the prompts.  Office hours are 8:00 a.m. to 4:30 p.m. Monday - Friday. Please note that voicemails left after 4:00 p.m. may not be returned until the following business day.  We are closed weekends and major holidays. You have access to a nurse at all times for urgent questions. Please call the main number to the clinic Dept: [phone] and follow the prompts.   For any non-urgent questions, you may also contact your provider using MyChart. We now offer e-Visits for anyone 85 and older to request care online for non-urgent symptoms. For details visit [email or web address].   Also download the MyChart app! Go to the app store, search "MyChart", open the app, select [facility], and log in with your MyChart username and password.  Masks are optional in the cancer centers. If you would like for your care team to wear a mask while they are taking care of you, please let them know. You may have one support person who is at least 65 years old accompany you for your appointments.

## 2022-07-02 NOTE — Addendum Note (Signed)
Addended by: Tora Kindred on: 07/02/2022 10:24 AM   Modules accepted: Orders

## 2022-07-02 NOTE — Progress Notes (Signed)
Alyssa Dudley   [phone] [phone]   Clinic Follow up Note   Patient Care Team: Unk Pinto, MD as PCP - General (Internal Medicine) Sable Feil, MD as Consulting Physician (Gastroenterology) Camillo Flaming, [place] as Referring Physician (Optometry) Fay Records, MD as Consulting Physician (Cardiology) Daryll Brod, MD as Consulting Physician (Orthopedic Surgery) [name], MD as Referring Physician (Hematology and Oncology)  Date of Service:  07/02/2022  CHIEF COMPLAINT: f/u of multiple myeloma  CURRENT THERAPY:  DRVd (daratumumab, Revlimid, Velcade, and dexamethasone), starting 05/15/22             -Revlimid dose: 70m days 1-14 every 21 days             -DVd weekly  ASSESSMENT & PLAN:  Alyssa Dudley a 66y.o. female with   1. Multiple Myeloma, IgG lambda type, stage II, high risk  -she presented with new onset anemia and proteinuria, no bone pain  -labs obtained on 04/24/22 showed significantly elevated M protein at 4.5, IgG at 6,049, with significantly elevated lambda free light chain at 129129mL, normal kappa light chain, 24hr urine obtained 04/26/22 showed significant elevation of total protein to 11,7103m4hr  -bone marrow biopsy on 05/01/22 confirmed plasma cell myeloma (60% plasma cells). -she meets criteria of CRAB (anemia and bone lesions on PET), normal creatinine and calcium level -PET scan performed 05/07/22, showed: diffuse hypermetabolic bone marrow activity throughout skeleton. -cytogenetics show normal karyotype and FISH panel, and deletion of 1P and gain of 1 q. which is a higher risk feature.  -she began Revlimid daily x14 days (then 7 days off) and weekly DVd (dara, velcade, and dex) on 05/15/22. She tolerated well overall with very mild side effects. -she saw Dr. LamAris Lot 05/29/22 to discuss bone marrow transplant. She is felt to be a good candidate. -light chain labs from 06/26/22 reviewed, her lambda  light chain continues to decrease, down to 2,566. -Labs from today also reviewed, CBC shows ANC 0.9,  hgb 10.3. She will decrease her revlimid dose to 1m46mily from C3 due to neutropenia, will continue 2 weeks on/1 week off regimen.   2.  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 Repeat on 04/19/22 showed further drop to 10.1, with iron 126 and ferritin 357.  No lab evidence of iron deficiency or B12 deficiency.  -most recent colonoscopy from 07/28/21 was benign, recall due 2027. POC cards obtained 04/20/22 were all negative.  -hgb improved to 10.3 today (07/02/22)   3.  Atrial fibrillation, hypertension, status post pacemaker placement      PLAN:  -proceed with DVd today -start new cycle 3 Revlimid 1mg40may, continue on for days 1-14 every 21 days -lab and DVd weekly -f/u every 2 weeks   No problem-specific Assessment & Plan notes found for this encounter.   SUMMARY OF ONCOLOGIC HISTORY: Oncology History  Multiple myeloma (HCC) Kinde25/2023 Cancer Staging   Staging form: Plasma Cell Myeloma and Plasma Cell Disorders, AJCC 8th Edition - Clinical stage from 05/01/2022: RISS Stage II (Beta-2-microglobulin (mg/L): 3.2, Albumin (g/dL): 3.6, ISS: Stage I, High-risk cytogenetics: Absent, LDH: Elevated) - Signed by Missie Gehrig,Truitt Merleon 06/03/2022 Stage prefix: Initial diagnosis Beta 2 microglobulin range (mg/L): Less than 3.5 Albumin range (g/dL): Greater than or equal to 3.5 Cytogenetics: 1p deletion   05/08/2022 Initial Diagnosis   Multiple myeloma (HCC) [place]/04/2022 -  Chemotherapy  Patient is on Treatment Plan : MYELOMA NEWLY DIAGNOSED TRANSPLANT CANDIDATE DaraVRd (Daratumumab IV) q21d x 6 Cycles (Induction/Consolidation)     05/15/2022 - 05/28/2022 Chemotherapy   Patient is on Treatment Plan : MYELOMA NEWLY DIAGNOSED TRANSPLANT CANDIDATE DaraVRd (Daratumumab IV) q21d x 6 Cycles (Induction/Consolidation)         INTERVAL HISTORY:  Alyssa Dudley is here for a follow up of multiple myeloma. She was last seen by me on 06/18/22. She presents to the clinic accompanied by her husband. She reports she is doing well overall, no new concerns.   All other systems were reviewed with the patient and are negative.  MEDICAL HISTORY:  Past Medical History:  Diagnosis Date   Allergy    once a year    Hyperlipidemia    borderline- no meds    Pacemaker    Paroxysmal atrial fibrillation ([place]) 6/15   detected on ppm interrogation   PONV (postoperative nausea and vomiting)    has vomitted in past   Second degree Mobitz II AV block    Vitamin D deficiency     SURGICAL HISTORY: Past Surgical History:  Procedure Laterality Date   BREAST CYST EXCISION  1980   COLONOSCOPY     EXTRACORPOREAL SHOCK WAVE LITHOTRIPSY Right 12/28/2019   Procedure: RIGHT EXTRACORPOREAL SHOCK WAVE LITHOTRIPSY (ESWL);  Surgeon: Raynelle Bring, MD;  Location: [facility];  Service: Urology;  Laterality: Right;   PERMANENT PACEMAKER INSERTION N/A 01/13/2013   SJM Accent DR RF implanted by Dr [name] for mobitz II AV block   PLANTAR FASCIECTOMY     POLYPECTOMY     WRIST SURGERY Right 2011   removed cyst    I have reviewed the social history and family history with the patient and they are unchanged from previous note.  ALLERGIES:  has No Known Allergies.  MEDICATIONS:  Current Outpatient Medications  Medication Sig Dispense Refill   acyclovir (ZOVIRAX) 400 MG tablet Take 1 tablet (400 mg total) by mouth 2 (two) times daily. 60 tablet 2   aspirin 81 MG chewable tablet Chew by mouth daily.     atenolol (TENORMIN) 50 MG tablet Take  1 tablet  Daily  for BP 90 tablet 3   dexamethasone (DECADRON) 4 MG tablet Take 5 tablets (20 mg total) by mouth once a week. 1-2 hours before chemo injections weekly 30 tablet 1   lenalidomide (REVLIMID) 10 MG capsule Take 2 capsules (20 mg total) by mouth daily. Take 2 capsules  (35m total) by mouth daily for day 1-14  every 21 days. [employer] Auth # [id number]    Date Obtained 06/18/2022 28 capsule 0   triamcinolone cream (KENALOG) 0.1 % APPLY EXTERNALLY TO THE AFFECTED AREA THREE TIMES DAILY AS NEEDED FOR RASH 30 g 2   Vitamin D, Ergocalciferol, (DRISDOL) 1.25 MG (50000 UNIT) CAPS capsule TAKE 1 CAPSULE BY MOUTH TWICE WEEKLY 180 capsule 0   Current Facility-Administered Medications  Medication Dose Route Frequency Provider Last Rate Last Admin   0.9 %  sodium chloride infusion  500 mL Intravenous Once Pyrtle, JLajuan Lines MD        PHYSICAL EXAMINATION: ECOG PERFORMANCE STATUS: 1 - Symptomatic but completely ambulatory  Vitals:   07/02/22 0825  BP: (!) 143/73  Pulse: 69  Resp: 18  Temp: 98.3 F (36.8 C)  SpO2: 100%   Wt Readings from Last 3 Encounters:  07/02/22 188 lb 8 oz (85.5 kg)  06/26/22 187 lb 12 oz (85.2 kg)  06/18/22  187 lb 8 oz (85 kg)    GENERAL:alert, no distress and comfortable SKIN: skin color normal, no rashes or significant lesions EYES: normal, Conjunctiva are pink and non-injected, sclera clear  NEURO: alert & oriented x 3 with fluent speech  LABORATORY DATA:  I have reviewed the data as listed    Latest Ref Rng & Units 07/02/2022    7:55 AM 06/26/2022    8:32 AM 06/18/2022    9:11 AM  CBC  WBC 4.0 - 10.5 K/uL 3.8  4.4  2.9   Hemoglobin 12.0 - 15.0 g/dL 10.3  9.9  10.0   Hematocrit 36.0 - 46.0 % 29.9  29.4  30.3   Platelets 150 - 400 K/uL 238  171  158         Latest Ref Rng & Units 07/02/2022    7:55 AM 06/26/2022    8:32 AM 06/18/2022    9:11 AM  CMP  Glucose 70 - 99 mg/dL 100  100  117   BUN 8 - 23 mg/dL '13  11  10   ' Creatinine 0.44 - 1.00 mg/dL 0.68  0.63  0.74   Sodium 135 - 145 mmol/L 141  142  140   Potassium 3.5 - 5.1 mmol/L 4.3  3.8  4.0   Chloride 98 - 111 mmol/L 110  110  107   CO2 22 - 32 mmol/L '28  27  30   ' Calcium 8.9 - 10.3 mg/dL 8.7  8.7  9.3   Total Protein 6.5 - 8.1 g/dL 6.8  6.8  7.5   Total Bilirubin 0.3  - 1.2 mg/dL 0.6  0.5  0.6   Alkaline Phos 38 - 126 U/L 91  91  82   AST 15 - 41 U/L '11  13  13   ' ALT 0 - 44 U/L '17  17  14       ' RADIOGRAPHIC STUDIES: I have personally reviewed the radiological images as listed and agreed with the findings in the report. No results found.    Orders Placed This Encounter  Procedures   CBC with Differential ([facility] Only)    Standing Status:   Future    Standing Expiration Date:   08/07/2023   CMP ([facility] only)    Standing Status:   Future    Standing Expiration Date:   08/07/2023   CBC with Differential ([facility] Only)    Standing Status:   Future    Standing Expiration Date:   08/14/2023   CMP ([facility] only)    Standing Status:   Future    Standing Expiration Date:   08/14/2023   CBC with Differential ([facility] Only)    Standing Status:   Future    Standing Expiration Date:   08/21/2023   CMP ([facility] only)    Standing Status:   Future    Standing Expiration Date:   08/21/2023   All questions were answered. The patient knows to call the clinic with any problems, questions or concerns. No barriers to learning was detected. The total time spent in the appointment was 30 minutes.     [name], MD 07/02/2022   I, Wilburn Mylar, am acting as scribe for [name], MD.   I have reviewed the above documentation for accuracy and completeness, and I agree with the above.

## 2022-07-02 NOTE — Progress Notes (Signed)
Patient states that she took pre medications at home at 0830. Per Dr. Burr Medico ok to treat with ANC 0.9.

## 2022-07-03 ENCOUNTER — Telehealth: Payer: Self-pay | Admitting: Hematology

## 2022-07-03 NOTE — Telephone Encounter (Signed)
Scheduled follow-up appointments per appointment request workqueue. Patient is aware. 

## 2022-07-05 ENCOUNTER — Encounter: Payer: Self-pay | Admitting: Hematology

## 2022-07-05 ENCOUNTER — Ambulatory Visit (INDEPENDENT_AMBULATORY_CARE_PROVIDER_SITE_OTHER): Payer: Medicare Other

## 2022-07-05 VITALS — Temp 97.5°F

## 2022-07-05 DIAGNOSIS — Z23 Encounter for immunization: Secondary | ICD-10-CM | POA: Diagnosis not present

## 2022-07-09 ENCOUNTER — Inpatient Hospital Stay: Payer: Medicare Other | Attending: Hematology

## 2022-07-09 ENCOUNTER — Other Ambulatory Visit: Payer: Self-pay

## 2022-07-09 ENCOUNTER — Inpatient Hospital Stay: Payer: Medicare Other

## 2022-07-09 VITALS — BP 151/77 | HR 64 | Temp 98.8°F | Resp 18 | Wt 187.0 lb

## 2022-07-09 DIAGNOSIS — D649 Anemia, unspecified: Secondary | ICD-10-CM

## 2022-07-09 DIAGNOSIS — Z5112 Encounter for antineoplastic immunotherapy: Secondary | ICD-10-CM | POA: Diagnosis present

## 2022-07-09 DIAGNOSIS — C9000 Multiple myeloma not having achieved remission: Secondary | ICD-10-CM | POA: Diagnosis not present

## 2022-07-09 DIAGNOSIS — Z79899 Other long term (current) drug therapy: Secondary | ICD-10-CM | POA: Diagnosis not present

## 2022-07-09 DIAGNOSIS — D630 Anemia in neoplastic disease: Secondary | ICD-10-CM | POA: Insufficient documentation

## 2022-07-09 LAB — CBC WITH DIFFERENTIAL (CANCER CENTER ONLY)
Abs Immature Granulocytes: 0.01 10*3/uL (ref 0.00–0.07)
Basophils Absolute: 0 10*3/uL (ref 0.0–0.1)
Basophils Relative: 0 %
Eosinophils Absolute: 0.1 10*3/uL (ref 0.0–0.5)
Eosinophils Relative: 4 %
HCT: 33 % — ABNORMAL LOW (ref 36.0–46.0)
Hemoglobin: 11.1 g/dL — ABNORMAL LOW (ref 12.0–15.0)
Immature Granulocytes: 0 %
Lymphocytes Relative: 51 %
Lymphs Abs: 1.4 10*3/uL (ref 0.7–4.0)
MCH: 33.2 pg (ref 26.0–34.0)
MCHC: 33.6 g/dL (ref 30.0–36.0)
MCV: 98.8 fL (ref 80.0–100.0)
Monocytes Absolute: 0.2 10*3/uL (ref 0.1–1.0)
Monocytes Relative: 8 %
Neutro Abs: 1 10*3/uL — ABNORMAL LOW (ref 1.7–7.7)
Neutrophils Relative %: 37 %
Platelet Count: 185 10*3/uL (ref 150–400)
RBC: 3.34 MIL/uL — ABNORMAL LOW (ref 3.87–5.11)
RDW: 15.2 % (ref 11.5–15.5)
WBC Count: 2.8 10*3/uL — ABNORMAL LOW (ref 4.0–10.5)
nRBC: 0 % (ref 0.0–0.2)

## 2022-07-09 LAB — CMP (CANCER CENTER ONLY)
ALT: 16 U/L (ref 0–44)
AST: 11 U/L — ABNORMAL LOW (ref 15–41)
Albumin: 4 g/dL (ref 3.5–5.0)
Alkaline Phosphatase: 94 U/L (ref 38–126)
Anion gap: 4 — ABNORMAL LOW (ref 5–15)
BUN: 6 mg/dL — ABNORMAL LOW (ref 8–23)
CO2: 30 mmol/L (ref 22–32)
Calcium: 8.8 mg/dL — ABNORMAL LOW (ref 8.9–10.3)
Chloride: 109 mmol/L (ref 98–111)
Creatinine: 0.64 mg/dL (ref 0.44–1.00)
GFR, Estimated: >60 mL/min (ref >60)
Glucose, Bld: 104 mg/dL — ABNORMAL HIGH (ref 70–99)
Potassium: 3.9 mmol/L (ref 3.5–5.1)
Sodium: 143 mmol/L (ref 135–145)
Total Bilirubin: 0.7 mg/dL (ref 0.3–1.2)
Total Protein: 6.5 g/dL (ref 6.5–8.1)

## 2022-07-09 MED ORDER — BORTEZOMIB CHEMO SQ INJECTION 3.5 MG (2.5MG/ML)
1.3000 mg/m2 | Freq: Once | INTRAMUSCULAR | Status: AC
  Administered 2022-07-09: 2.5 mg via SUBCUTANEOUS
  Filled 2022-07-09: qty 1

## 2022-07-09 MED ORDER — DARATUMUMAB-HYALURONIDASE-FIHJ 1800-30000 MG-UT/15ML ~~LOC~~ SOLN
1800.0000 mg | Freq: Once | SUBCUTANEOUS | Status: AC
  Administered 2022-07-09: 1800 mg via SUBCUTANEOUS
  Filled 2022-07-09: qty 15

## 2022-07-09 NOTE — Patient Instructions (Signed)
[facility]  Discharge Instructions: Thank you for choosing [facility] to provide your oncology and hematology care.   If you have a lab appointment with the [facility], please go directly to the [facility] and check in at the registration area.   Wear comfortable clothing and clothing appropriate for easy access to any Portacath or PICC line.   We strive to give you quality time with your provider. You may need to reschedule your appointment if you arrive late (15 or more minutes).  Arriving late affects you and other patients whose appointments are after yours.  Also, if you miss three or more appointments without notifying the office, you may be dismissed from the clinic at the provider's discretion.      For prescription refill requests, have your pharmacy contact our office and allow 72 hours for refills to be completed.    Today you received the following chemotherapy and/or immunotherapy agents Daratumumab and Velcade      To help prevent nausea and vomiting after your treatment, we encourage you to take your nausea medication as directed.  BELOW ARE SYMPTOMS THAT SHOULD BE REPORTED IMMEDIATELY: *FEVER GREATER THAN 100.4 F (38 C) OR HIGHER *CHILLS OR SWEATING *NAUSEA AND VOMITING THAT IS NOT CONTROLLED WITH YOUR NAUSEA MEDICATION *UNUSUAL SHORTNESS OF BREATH *UNUSUAL BRUISING OR BLEEDING *URINARY PROBLEMS (pain or burning when urinating, or frequent urination) *BOWEL PROBLEMS (unusual diarrhea, constipation, pain near the anus) TENDERNESS IN MOUTH AND THROAT WITH OR WITHOUT PRESENCE OF ULCERS (sore throat, sores in mouth, or a toothache) UNUSUAL RASH, SWELLING OR PAIN  UNUSUAL VAGINAL DISCHARGE OR ITCHING   Items with * indicate a potential emergency and should be followed up as soon as possible or go to the Emergency Department if any problems should occur.  Please show the CHEMOTHERAPY ALERT CARD or IMMUNOTHERAPY ALERT CARD at  check-in to the Emergency Department and triage nurse.  Should you have questions after your visit or need to cancel or reschedule your appointment, please contact [facility]  Dept: [phone]  and follow the prompts.  Office hours are 8:00 a.m. to 4:30 p.m. Monday - Friday. Please note that voicemails left after 4:00 p.m. may not be returned until the following business day.  We are closed weekends and major holidays. You have access to a nurse at all times for urgent questions. Please call the main number to the clinic Dept: [phone] and follow the prompts.   For any non-urgent questions, you may also contact your provider using MyChart. We now offer e-Visits for anyone 39 and older to request care online for non-urgent symptoms. For details visit [email or web address].   Also download the MyChart app! Go to the app store, search "MyChart", open the app, select [facility], and log in with your MyChart username and password.  Masks are optional in the cancer centers. If you would like for your care team to wear a mask while they are taking care of you, please let them know. You may have one support person who is at least 65 years old accompany you for your appointments.

## 2022-07-09 NOTE — Progress Notes (Signed)
Pt took tylenol, benadryl and dexamethasone at home before appt.

## 2022-07-10 ENCOUNTER — Encounter: Payer: Self-pay | Admitting: Hematology

## 2022-07-10 LAB — KAPPA/LAMBDA LIGHT CHAINS
Kappa free light chain: 3.7 mg/L (ref 3.3–19.4)
Kappa, lambda light chain ratio: 0 — ABNORMAL LOW (ref 0.26–1.65)
Lambda free light chains: 2351.9 mg/L — ABNORMAL HIGH (ref 5.7–26.3)

## 2022-07-12 LAB — MULTIPLE MYELOMA PANEL, SERUM
Albumin SerPl Elph-Mcnc: 3.5 g/dL (ref 2.9–4.4)
Albumin/Glob SerPl: 1.2 (ref 0.7–1.7)
Alpha 1: 0.2 g/dL (ref 0.0–0.4)
Alpha2 Glob SerPl Elph-Mcnc: 0.7 g/dL (ref 0.4–1.0)
B-Globulin SerPl Elph-Mcnc: 0.9 g/dL (ref 0.7–1.3)
Gamma Glob SerPl Elph-Mcnc: 1.2 g/dL (ref 0.4–1.8)
Globulin, Total: 3 g/dL (ref 2.2–3.9)
IgA: 5 mg/dL — ABNORMAL LOW (ref 87–352)
IgG (Immunoglobin G), Serum: 1157 mg/dL (ref 586–1602)
IgM (Immunoglobulin M), Srm: 9 mg/dL — ABNORMAL LOW (ref 26–217)
M Protein SerPl Elph-Mcnc: 1 g/dL — ABNORMAL HIGH
Total Protein ELP: 6.5 g/dL (ref 6.0–8.5)

## 2022-07-17 ENCOUNTER — Inpatient Hospital Stay (HOSPITAL_BASED_OUTPATIENT_CLINIC_OR_DEPARTMENT_OTHER): Payer: Medicare Other | Admitting: Hematology

## 2022-07-17 ENCOUNTER — Inpatient Hospital Stay: Payer: Medicare Other

## 2022-07-17 ENCOUNTER — Encounter: Payer: Self-pay | Admitting: Hematology

## 2022-07-17 DIAGNOSIS — C9000 Multiple myeloma not having achieved remission: Secondary | ICD-10-CM | POA: Diagnosis not present

## 2022-07-17 DIAGNOSIS — Z5112 Encounter for antineoplastic immunotherapy: Secondary | ICD-10-CM | POA: Diagnosis not present

## 2022-07-17 DIAGNOSIS — D649 Anemia, unspecified: Secondary | ICD-10-CM

## 2022-07-17 LAB — CBC WITH DIFFERENTIAL (CANCER CENTER ONLY)
Abs Immature Granulocytes: 0.01 10*3/uL (ref 0.00–0.07)
Basophils Absolute: 0 10*3/uL (ref 0.0–0.1)
Basophils Relative: 1 %
Eosinophils Absolute: 0.1 10*3/uL (ref 0.0–0.5)
Eosinophils Relative: 2 %
HCT: 32.1 % — ABNORMAL LOW (ref 36.0–46.0)
Hemoglobin: 10.8 g/dL — ABNORMAL LOW (ref 12.0–15.0)
Immature Granulocytes: 0 %
Lymphocytes Relative: 47 %
Lymphs Abs: 1.7 10*3/uL (ref 0.7–4.0)
MCH: 32.8 pg (ref 26.0–34.0)
MCHC: 33.6 g/dL (ref 30.0–36.0)
MCV: 97.6 fL (ref 80.0–100.0)
Monocytes Absolute: 0.6 10*3/uL (ref 0.1–1.0)
Monocytes Relative: 17 %
Neutro Abs: 1.2 10*3/uL — ABNORMAL LOW (ref 1.7–7.7)
Neutrophils Relative %: 33 %
Platelet Count: 174 10*3/uL (ref 150–400)
RBC: 3.29 MIL/uL — ABNORMAL LOW (ref 3.87–5.11)
RDW: 14.6 % (ref 11.5–15.5)
WBC Count: 3.7 10*3/uL — ABNORMAL LOW (ref 4.0–10.5)
nRBC: 0 % (ref 0.0–0.2)

## 2022-07-17 LAB — CMP (CANCER CENTER ONLY)
ALT: 16 U/L (ref 0–44)
AST: 14 U/L — ABNORMAL LOW (ref 15–41)
Albumin: 3.8 g/dL (ref 3.5–5.0)
Alkaline Phosphatase: 89 U/L (ref 38–126)
Anion gap: 3 — ABNORMAL LOW (ref 5–15)
BUN: 15 mg/dL (ref 8–23)
CO2: 29 mmol/L (ref 22–32)
Calcium: 8.8 mg/dL — ABNORMAL LOW (ref 8.9–10.3)
Chloride: 109 mmol/L (ref 98–111)
Creatinine: 0.76 mg/dL (ref 0.44–1.00)
GFR, Estimated: >60 mL/min (ref >60)
Glucose, Bld: 101 mg/dL — ABNORMAL HIGH (ref 70–99)
Potassium: 3.5 mmol/L (ref 3.5–5.1)
Sodium: 141 mmol/L (ref 135–145)
Total Bilirubin: 0.6 mg/dL (ref 0.3–1.2)
Total Protein: 6.5 g/dL (ref 6.5–8.1)

## 2022-07-17 MED ORDER — BORTEZOMIB CHEMO SQ INJECTION 3.5 MG (2.5MG/ML)
1.3000 mg/m2 | Freq: Once | INTRAMUSCULAR | Status: AC
  Administered 2022-07-17: 2.5 mg via SUBCUTANEOUS
  Filled 2022-07-17: qty 1

## 2022-07-17 MED ORDER — DEXAMETHASONE 4 MG PO TABS
20.0000 mg | ORAL_TABLET | Freq: Once | ORAL | Status: AC
  Administered 2022-07-17: 20 mg via ORAL
  Filled 2022-07-17: qty 5

## 2022-07-17 MED ORDER — DEXAMETHASONE 4 MG PO TABS: 20.0000 mg | ORAL_TABLET | ORAL | 1 refills | Status: DC

## 2022-07-17 MED ORDER — DARATUMUMAB-HYALURONIDASE-FIHJ 1800-30000 MG-UT/15ML ~~LOC~~ SOLN
1800.0000 mg | Freq: Once | SUBCUTANEOUS | Status: AC
  Administered 2022-07-17: 1800 mg via SUBCUTANEOUS
  Filled 2022-07-17: qty 15

## 2022-07-17 NOTE — Patient Instructions (Signed)
[facility]COL[facility]   Discharge Instructions: Thank you for choosing [facility] to provide your oncology and hematology care.   If you have a lab appointment with the [facility], please go directly to the [facility] and check in at the registration area.   Wear comfortable clothing and clothing appropriate for easy access to any Portacath or PICC line.   We strive to give you quality time with your provider. You may need to reschedule your appointment if you arrive late (15 or more minutes).  Arriving late affects you and other patients whose appointments are after yours.  Also, if you miss three or more appointments without notifying the office, you may be dismissed from the clinic at the provider's discretion.      For prescription refill requests, have your pharmacy contact our office and allow 72 hours for refills to be completed.    Today you received the following chemotherapy and/or immunotherapy agents: Daratumumab hyaluronidase (Darzalex faspro) and Bortezomib (Velcade)       To help prevent nausea and vomiting after your treatment, we encourage you to take your nausea medication as directed.  BELOW ARE SYMPTOMS THAT SHOULD BE REPORTED IMMEDIATELY: *FEVER GREATER THAN 100.4 F (38 C) OR HIGHER *CHILLS OR SWEATING *NAUSEA AND VOMITING THAT IS NOT CONTROLLED WITH YOUR NAUSEA MEDICATION *UNUSUAL SHORTNESS OF BREATH *UNUSUAL BRUISING OR BLEEDING *URINARY PROBLEMS (pain or burning when urinating, or frequent urination) *BOWEL PROBLEMS (unusual diarrhea, constipation, pain near the anus) TENDERNESS IN MOUTH AND THROAT WITH OR WITHOUT PRESENCE OF ULCERS (sore throat, sores in mouth, or a toothache) UNUSUAL RASH, SWELLING OR PAIN  UNUSUAL VAGINAL DISCHARGE OR ITCHING   Items with * indicate a potential emergency and should be followed up as soon as possible or go to the Emergency Department if any problems should occur.  Please show the  CHEMOTHERAPY ALERT CARD or IMMUNOTHERAPY ALERT CARD at check-in to the Emergency Department and triage nurse.  Should you have questions after your visit or need to cancel or reschedule your appointment, please contact [facility]  Dept: [phone]  and follow the prompts.  Office hours are 8:00 a.m. to 4:30 p.m. Monday - Friday. Please note that voicemails left after 4:00 p.m. may not be returned until the following business day.  We are closed weekends and major holidays. You have access to a nurse at all times for urgent questions. Please call the main number to the clinic Dept: [phone] and follow the prompts.   For any non-urgent questions, you may also contact your provider using MyChart. We now offer e-Visits for anyone 31 and older to request care online for non-urgent symptoms. For details visit [email or web address].   Also download the MyChart app! Go to the app store, search "MyChart", open the app, select [facility], and log in with your MyChart username and password.  Masks are optional in the cancer centers. If you would like for your care team to wear a mask while they are taking care of you, please let them know. You may have one support person who is at least 65 years old accompany you for your appointments.

## 2022-07-17 NOTE — Progress Notes (Signed)
Per patient, she did not take her home medications prior to her appointment today. Alyssa Dudley, Rph made aware. Premedication added and medication re-timed.  Patient declined to take benadryl.

## 2022-07-17 NOTE — Progress Notes (Addendum)
Alyssa Dudley   Telephone:([phone]) [phone] Fax:([phone]) [phone]   Clinic Follow up Note   Patient Care Team: Unk Pinto, MD as PCP - General (Internal Medicine) Sable Feil, MD as Consulting Physician (Gastroenterology) Camillo Flaming, [place] as Referring Physician (Optometry) Fay Records, MD as Consulting Physician (Cardiology) Daryll Brod, MD as Consulting Physician (Orthopedic Surgery) [name], MD as Referring Physician (Hematology and Oncology)  Date of Service:  07/17/2022  CHIEF COMPLAINT: f/u of multiple myeloma  CURRENT THERAPY:  DRVd (daratumumab, Revlimid, Velcade, and dexamethasone), starting 05/15/22             -Revlimid dose: 25m (dose reduced due to neutropenia) days 1-14 every 21 days             -DVd weekly  ASSESSMENT & PLAN:  Alyssa JSeifriedis a 66y.o. female with   1. Multiple Myeloma, IgG lambda type, stage II, high risk  -she presented with new onset anemia and proteinuria, no bone pain  -labs obtained on 04/24/22 showed significantly elevated M protein at 4.5, IgG at 6,049, with significantly elevated lambda free light chain at 129134mL, normal kappa light chain, 24hr urine obtained 04/26/22 showed significant elevation of total protein to 11,71051m4hr  -bone marrow biopsy on 05/01/22 confirmed plasma cell myeloma (60% plasma cells). -she meets criteria of CRAB (anemia and bone lesions on PET), normal creatinine and calcium level -PET scan performed 05/07/22, showed: diffuse hypermetabolic bone marrow activity throughout skeleton. -cytogenetics show normal karyotype and FISH panel, and deletion of 1P and gain of 1 q. which is a higher risk feature.  -she began Revlimid daily x14 days (then 7 days off) and weekly DVd (dara, velcade, and dex) on 05/15/22. She tolerated well overall with very mild side effects. -she saw Dr. LamAris Lot 05/29/22 to discuss bone marrow transplant. She is felt to be a good candidate. -MM labs from  07/09/22 reviewed, her lambda light chain continues to decrease, down to 2,351.9. Her IgG is now WNL, and M protein is down to 1. Repeat today is pending. She has had partial response so far -Labs from today also reviewed, CBC shows ANC 1.2, hgb 10.8. She will continue 75m70mvlimid dose due to neutropenia. Will proceed with C4D1 DVd today as scheduled. -plan to obtain repeat 24hr urine next week, and we will review the results at our next visit.   2.  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 Repeat on 04/19/22 showed further drop to 10.1, with iron 126 and ferritin 357.  No lab evidence of iron deficiency or B12 deficiency.  -most recent colonoscopy from 07/28/21 was benign, recall due 2027. POC cards obtained 04/20/22 were all negative.  -hgb stable at 10.8 today (07/17/22)   3.  Atrial fibrillation, hypertension, status post pacemaker placement      PLAN:  -proceed with DVd today -start cycle 4 Revlimid 10/16 -obtain 24hr urine in next week -lab and DVd weekly -f/u every 2 weeks -will copy Dr. LambAris LotNo problem-specific Assessment & Plan notes found for this encounter.   SUMMARY OF ONCOLOGIC HISTORY: Oncology History  Multiple myeloma (HCC)[facility]/25/2023 Cancer Staging   Staging form: Plasma Cell Myeloma and Plasma Cell Disorders, AJCC 8th Edition - Clinical stage from 05/01/2022: RISS Stage II (Beta-2-microglobulin (mg/L): 3.2, Albumin (g/dL): 3.6, ISS: Stage I, High-risk cytogenetics: Absent, LDH: Elevated) - Signed by FengTruitt Merle on 06/03/2022  Stage prefix: Initial diagnosis Beta 2 microglobulin range (mg/L): Less than 3.5 Albumin range (g/dL): Greater than or equal to 3.5 Cytogenetics: 1p deletion   05/08/2022 Initial Diagnosis   Multiple myeloma ([place])   05/14/2022 -  Chemotherapy   Patient is on Treatment Plan : MYELOMA NEWLY DIAGNOSED TRANSPLANT CANDIDATE DaraVRd (Daratumumab IV) q21d x 6  Cycles (Induction/Consolidation)     05/15/2022 - 05/28/2022 Chemotherapy   Patient is on Treatment Plan : MYELOMA NEWLY DIAGNOSED TRANSPLANT CANDIDATE DaraVRd (Daratumumab IV) q21d x 6 Cycles (Induction/Consolidation)        INTERVAL HISTORY:  Alyssa Dudley is here for a follow up of multiple myeloma. She was last seen by me on 07/02/22. She presents to the clinic alone. She reports she is doing well. She notes she has gotten her flu and Covid shots (a week apart, she adds).    All other systems were reviewed with the patient and are negative.  MEDICAL HISTORY:  Past Medical History:  Diagnosis Date   Allergy    once a year    Hyperlipidemia    borderline- no meds    Pacemaker    Paroxysmal atrial fibrillation ([place]) 6/15   detected on ppm interrogation   PONV (postoperative nausea and vomiting)    has vomitted in past   Second degree Mobitz II AV block    Vitamin D deficiency     SURGICAL HISTORY: Past Surgical History:  Procedure Laterality Date   BREAST CYST EXCISION  1980   COLONOSCOPY     EXTRACORPOREAL SHOCK WAVE LITHOTRIPSY Right 12/28/2019   Procedure: RIGHT EXTRACORPOREAL SHOCK WAVE LITHOTRIPSY (ESWL);  Surgeon: Raynelle Bring, MD;  Location: [facility];  Service: Urology;  Laterality: Right;   PERMANENT PACEMAKER INSERTION N/A 01/13/2013   SJM [employer] DR RF implanted by Dr [name] for mobitz II AV block   PLANTAR FASCIECTOMY     POLYPECTOMY     WRIST SURGERY Right 2011   removed cyst    I have reviewed the social history and family history with the patient and they are unchanged from previous note.  ALLERGIES:  has No Known Allergies.  MEDICATIONS:  Current Outpatient Medications  Medication Sig Dispense Refill   acyclovir (ZOVIRAX) 400 MG tablet Take 1 tablet (400 mg total) by mouth 2 (two) times daily. 60 tablet 2   aspirin 81 MG chewable tablet Chew by mouth daily.     atenolol (TENORMIN) 50 MG tablet Take  1 tablet  Daily  for BP 90 tablet  3   chlorhexidine (PERIDEX) 0.12 % solution 10 mLs as needed.     dexamethasone (DECADRON) 4 MG tablet Take 5 tablets (20 mg total) by mouth once a week. 1-2 hours before chemo injections weekly 30 tablet 1   lenalidomide (REVLIMID) 10 MG capsule Take 2 capsules (20 mg total) by mouth daily. Take 2 capsules (48m total) by mouth daily for day 1-14  every 21 days. [employer] Auth # [id number]    Date Obtained 06/18/2022 28 capsule 0   triamcinolone cream (KENALOG) 0.1 % APPLY EXTERNALLY TO THE AFFECTED AREA THREE TIMES DAILY AS NEEDED FOR RASH 30 g 2   Vitamin D, Ergocalciferol, (DRISDOL) 1.25 MG (50000 UNIT) CAPS capsule TAKE 1 CAPSULE BY MOUTH TWICE WEEKLY 180 capsule 0   Current Facility-Administered Medications  Medication Dose Route Frequency Provider Last Rate Last Admin   0.9 %  sodium chloride infusion  500 mL Intravenous Once Pyrtle, JLajuan Lines MD  PHYSICAL EXAMINATION: ECOG PERFORMANCE STATUS: 1 - Symptomatic but completely ambulatory  Vitals:   07/17/22 1430  BP: (!) 147/70  Pulse: 70  Resp: 18  Temp: 98.2 F (36.8 C)  SpO2: 100%   Wt Readings from Last 3 Encounters:  07/17/22 188 lb 11.2 oz (85.6 kg)  07/09/22 187 lb (84.8 kg)  07/02/22 188 lb 8 oz (85.5 kg)    GENERAL:alert, no distress and comfortable SKIN: skin color normal, no rashes or significant lesions EYES: normal, Conjunctiva are pink and non-injected, sclera clear  NEURO: alert & oriented x 3 with fluent speech  LABORATORY DATA:  I have reviewed the data as listed    Latest Ref Rng & Units 07/17/2022    1:57 PM 07/09/2022    8:14 AM 07/02/2022    7:55 AM  CBC  WBC 4.0 - 10.5 K/uL 3.7  2.8  3.8   Hemoglobin 12.0 - 15.0 g/dL 10.8  11.1  10.3   Hematocrit 36.0 - 46.0 % 32.1  33.0  29.9   Platelets 150 - 400 K/uL 174  185  238         Latest Ref Rng & Units 07/17/2022    1:57 PM 07/09/2022    8:14 AM 07/02/2022    7:55 AM  CMP  Glucose 70 - 99 mg/dL 101  104  100   BUN 8 - 23 mg/dL _0 Creatinine 0.44 - 1.00 mg/dL 0.76  0.64  0.68   Sodium 135 - 145 mmol/L 141  143  141   Potassium 3.5 - 5.1 mmol/L 3.5  3.9  4.3   Chloride 98 - 111 mmol/L 109  109  110   CO2 22 - 32 mmol/L _1 Calcium 8.9 - 10.3 mg/dL 8.8  8.8  8.7   Total Protein 6.5 - 8.1 g/dL 6.5  6.5  6.8   Total Bilirubin 0.3 - 1.2 mg/dL 0.6  0.7  0.6   Alkaline Phos 38 - 126 U/L 89  94  91   AST 15 - 41 U/L _2 ALT 0 - 44 U/L _3 RADIOGRAPHIC STUDIES: I have personally reviewed the radiological images as listed and agreed with the findings in the report. No results found.    No orders of the defined types were placed in this encounter.  All questions were answered. The patient knows to call the clinic with any problems, questions or concerns. No barriers to learning was detected. The total time spent in the appointment was 30 minutes.     [name], MD 07/17/2022   I, Wilburn Mylar, am acting as scribe for [name], MD.   I have reviewed the above documentation for accuracy and completeness, and I agree with the above.

## 2022-07-19 ENCOUNTER — Other Ambulatory Visit: Payer: Self-pay

## 2022-07-19 LAB — KAPPA/LAMBDA LIGHT CHAINS
Kappa free light chain: 4.7 mg/L (ref 3.3–19.4)
Kappa, lambda light chain ratio: 0 — ABNORMAL LOW (ref 0.26–1.65)
Lambda free light chains: 1765.5 mg/L — ABNORMAL HIGH (ref 5.7–26.3)

## 2022-07-19 MED ORDER — LENALIDOMIDE 10 MG PO CAPS: 20.0000 mg | ORAL_CAPSULE | Freq: Every day | ORAL | 0 refills | Status: DC

## 2022-07-20 ENCOUNTER — Other Ambulatory Visit: Payer: Self-pay

## 2022-07-20 NOTE — Progress Notes (Signed)
Spoke with pharmacist at [facility] regarding pt's Revlimid prescription informed pharmacist that Dr. Burr Medico would like to keep the prescription as is d/t pt experienced neutropenia and required dose reduction from '25mg'$  and may need a dose reduction again if needed.  Pharmacist stated he will expedite the pt's shipment and will push the prescription through their system.

## 2022-07-23 ENCOUNTER — Inpatient Hospital Stay: Payer: Medicare Other

## 2022-07-23 VITALS — BP 142/87 | HR 61 | Temp 98.1°F | Resp 18 | Wt 187.1 lb

## 2022-07-23 DIAGNOSIS — C9000 Multiple myeloma not having achieved remission: Secondary | ICD-10-CM

## 2022-07-23 DIAGNOSIS — Z5112 Encounter for antineoplastic immunotherapy: Secondary | ICD-10-CM | POA: Diagnosis not present

## 2022-07-23 DIAGNOSIS — D649 Anemia, unspecified: Secondary | ICD-10-CM

## 2022-07-23 LAB — CMP (CANCER CENTER ONLY)
ALT: 17 U/L (ref 0–44)
AST: 12 U/L — ABNORMAL LOW (ref 15–41)
Albumin: 4.1 g/dL (ref 3.5–5.0)
Alkaline Phosphatase: 97 U/L (ref 38–126)
Anion gap: 5 (ref 5–15)
BUN: 13 mg/dL (ref 8–23)
CO2: 28 mmol/L (ref 22–32)
Calcium: 9.2 mg/dL (ref 8.9–10.3)
Chloride: 108 mmol/L (ref 98–111)
Creatinine: 0.67 mg/dL (ref 0.44–1.00)
GFR, Estimated: >60 mL/min (ref >60)
Glucose, Bld: 93 mg/dL (ref 70–99)
Potassium: 4.1 mmol/L (ref 3.5–5.1)
Sodium: 141 mmol/L (ref 135–145)
Total Bilirubin: 0.7 mg/dL (ref 0.3–1.2)
Total Protein: 6.8 g/dL (ref 6.5–8.1)

## 2022-07-23 LAB — CBC WITH DIFFERENTIAL (CANCER CENTER ONLY)
Abs Immature Granulocytes: 0.01 10*3/uL (ref 0.00–0.07)
Basophils Absolute: 0 10*3/uL (ref 0.0–0.1)
Basophils Relative: 1 %
Eosinophils Absolute: 0.1 10*3/uL (ref 0.0–0.5)
Eosinophils Relative: 2 %
HCT: 34.8 % — ABNORMAL LOW (ref 36.0–46.0)
Hemoglobin: 11.7 g/dL — ABNORMAL LOW (ref 12.0–15.0)
Immature Granulocytes: 0 %
Lymphocytes Relative: 53 %
Lymphs Abs: 2.4 10*3/uL (ref 0.7–4.0)
MCH: 32.7 pg (ref 26.0–34.0)
MCHC: 33.6 g/dL (ref 30.0–36.0)
MCV: 97.2 fL (ref 80.0–100.0)
Monocytes Absolute: 0.8 10*3/uL (ref 0.1–1.0)
Monocytes Relative: 16 %
Neutro Abs: 1.3 10*3/uL — ABNORMAL LOW (ref 1.7–7.7)
Neutrophils Relative %: 28 %
Platelet Count: 212 10*3/uL (ref 150–400)
RBC: 3.58 MIL/uL — ABNORMAL LOW (ref 3.87–5.11)
RDW: 14.9 % (ref 11.5–15.5)
WBC Count: 4.6 10*3/uL (ref 4.0–10.5)
nRBC: 0 % (ref 0.0–0.2)

## 2022-07-23 LAB — MULTIPLE MYELOMA PANEL, SERUM
Albumin SerPl Elph-Mcnc: 3.4 g/dL (ref 2.9–4.4)
Albumin/Glob SerPl: 1.4 (ref 0.7–1.7)
Alpha 1: 0.2 g/dL (ref 0.0–0.4)
Alpha2 Glob SerPl Elph-Mcnc: 0.7 g/dL (ref 0.4–1.0)
B-Globulin SerPl Elph-Mcnc: 0.8 g/dL (ref 0.7–1.3)
Gamma Glob SerPl Elph-Mcnc: 1 g/dL (ref 0.4–1.8)
Globulin, Total: 2.6 g/dL (ref 2.2–3.9)
IgA: 5 mg/dL — ABNORMAL LOW (ref 87–352)
IgG (Immunoglobin G), Serum: 986 mg/dL (ref 586–1602)
IgM (Immunoglobulin M), Srm: 9 mg/dL — ABNORMAL LOW (ref 26–217)
M Protein SerPl Elph-Mcnc: 0.8 g/dL — ABNORMAL HIGH
Total Protein ELP: 6 g/dL (ref 6.0–8.5)

## 2022-07-23 MED ORDER — BORTEZOMIB CHEMO SQ INJECTION 3.5 MG (2.5MG/ML)
1.3000 mg/m2 | Freq: Once | INTRAMUSCULAR | Status: AC
  Administered 2022-07-23: 2.5 mg via SUBCUTANEOUS
  Filled 2022-07-23: qty 1

## 2022-07-23 MED ORDER — DARATUMUMAB-HYALURONIDASE-FIHJ 1800-30000 MG-UT/15ML ~~LOC~~ SOLN
1800.0000 mg | Freq: Once | SUBCUTANEOUS | Status: AC
  Administered 2022-07-23: 1800 mg via SUBCUTANEOUS
  Filled 2022-07-23: qty 15

## 2022-07-23 NOTE — Progress Notes (Signed)
Per Dr. Alvy Bimler, ok to treat with low ANC.  Pt took pre-meds at home prior to arrival.

## 2022-07-23 NOTE — Patient Instructions (Signed)
[facility] MEDICAL ONCOLOGY  Discharge Instructions: Thank you for choosing [facility] to provide your oncology and hematology care.   If you have a lab appointment with the Cancer Center, please go directly to the Cancer Center and check in at the registration area.   Wear comfortable clothing and clothing appropriate for easy access to any Portacath or PICC line.   We strive to give you quality time with your provider. You may need to reschedule your appointment if you arrive late (15 or more minutes).  Arriving late affects you and other patients whose appointments are after yours.  Also, if you miss three or more appointments without notifying the office, you may be dismissed from the clinic at the provider's discretion.      For prescription refill requests, have your pharmacy contact our office and allow 72 hours for refills to be completed.    Today you received the following chemotherapy and/or immunotherapy agents velcade, darzalex faspro      To help prevent nausea and vomiting after your treatment, we encourage you to take your nausea medication as directed.  BELOW ARE SYMPTOMS THAT SHOULD BE REPORTED IMMEDIATELY: *FEVER GREATER THAN 100.4 F (38 C) OR HIGHER *CHILLS OR SWEATING *NAUSEA AND VOMITING THAT IS NOT CONTROLLED WITH YOUR NAUSEA MEDICATION *UNUSUAL SHORTNESS OF BREATH *UNUSUAL BRUISING OR BLEEDING *URINARY PROBLEMS (pain or burning when urinating, or frequent urination) *BOWEL PROBLEMS (unusual diarrhea, constipation, pain near the anus) TENDERNESS IN MOUTH AND THROAT WITH OR WITHOUT PRESENCE OF ULCERS (sore throat, sores in mouth, or a toothache) UNUSUAL RASH, SWELLING OR PAIN  UNUSUAL VAGINAL DISCHARGE OR ITCHING   Items with * indicate a potential emergency and should be followed up as soon as possible or go to the Emergency Department if any problems should occur.  Please show the CHEMOTHERAPY ALERT CARD or IMMUNOTHERAPY ALERT CARD  at check-in to the Emergency Department and triage nurse.  Should you have questions after your visit or need to cancel or reschedule your appointment, please contact [facility] MEDICAL ONCOLOG[facility]  Dept: [phone]  and follow the prompts.  Office hours are 8:00 a.m. to 4:30 p.m. Monday - Friday. Please note that voicemails left after 4:00 p.m. may not be returned until the following business day.  We are closed weekends and major holidays. You have access to a nurse at all times for urgent questions. Please call the main number to the clinic Dept: [phone] and follow the prompts.   For any non-urgent questions, you may also contact your provider using MyChart. We now offer e-Visits for anyone 18 and older to request care online for non-urgent symptoms. For details visit [email or web address].   Also download the MyChart app! Go to the app store, search "MyChart", open the app, select [facility], and log in with your MyChart username and password.  Masks are optional in the cancer centers. If you would like for your care team to wear a mask while they are taking care of you, please let them know. You may have one support person who is at least 65 years old accompany you for your appointments. 

## 2022-07-24 LAB — KAPPA/LAMBDA LIGHT CHAINS
Kappa free light chain: 3 mg/L — ABNORMAL LOW (ref 3.3–19.4)
Kappa, lambda light chain ratio: 0 — ABNORMAL LOW (ref 0.26–1.65)
Lambda free light chains: 1694.9 mg/L — ABNORMAL HIGH (ref 5.7–26.3)

## 2022-07-26 ENCOUNTER — Other Ambulatory Visit: Payer: Self-pay | Admitting: *Deleted

## 2022-07-26 DIAGNOSIS — D649 Anemia, unspecified: Secondary | ICD-10-CM

## 2022-07-26 DIAGNOSIS — Z5112 Encounter for antineoplastic immunotherapy: Secondary | ICD-10-CM | POA: Diagnosis not present

## 2022-07-26 LAB — MULTIPLE MYELOMA PANEL, SERUM
Albumin SerPl Elph-Mcnc: 3.5 g/dL (ref 2.9–4.4)
Albumin/Glob SerPl: 1.3 (ref 0.7–1.7)
Alpha 1: 0.2 g/dL (ref 0.0–0.4)
Alpha2 Glob SerPl Elph-Mcnc: 0.7 g/dL (ref 0.4–1.0)
B-Globulin SerPl Elph-Mcnc: 0.9 g/dL (ref 0.7–1.3)
Gamma Glob SerPl Elph-Mcnc: 1 g/dL (ref 0.4–1.8)
Globulin, Total: 2.8 g/dL (ref 2.2–3.9)
IgA: <5 mg/dL — ABNORMAL LOW (ref 87–352)
IgG (Immunoglobin G), Serum: 1035 mg/dL (ref 586–1602)
IgM (Immunoglobulin M), Srm: 11 mg/dL — ABNORMAL LOW (ref 26–217)
M Protein SerPl Elph-Mcnc: 0.8 g/dL — ABNORMAL HIGH
Total Protein ELP: 6.3 g/dL (ref 6.0–8.5)

## 2022-07-30 ENCOUNTER — Ambulatory Visit: Payer: Medicare Other

## 2022-07-30 ENCOUNTER — Ambulatory Visit: Payer: Medicare Other | Admitting: Hematology

## 2022-07-30 ENCOUNTER — Other Ambulatory Visit: Payer: Medicare Other

## 2022-07-30 LAB — UPEP/UIFE/LIGHT CHAINS/TP, 24-HR UR
% BETA, Urine: 0 %
ALPHA 1 URINE: 0 %
Albumin, U: 100 %
Alpha 2, Urine: 0 %
Free Kappa Lt Chains,Ur: 3.18 mg/L (ref 1.17–86.46)
Free Kappa/Lambda Ratio: 0.15 — ABNORMAL LOW (ref 1.83–14.26)
Free Lambda Lt Chains,Ur: 20.71 mg/L — ABNORMAL HIGH (ref 0.27–15.21)
GAMMA GLOBULIN URINE: 0 %
Total Protein, Urine-Ur/day: 123 mg/24 hr (ref 30–150)
Total Protein, Urine: 6 mg/dL
Total Volume: 2050

## 2022-07-31 ENCOUNTER — Inpatient Hospital Stay: Payer: Medicare Other

## 2022-07-31 ENCOUNTER — Inpatient Hospital Stay (HOSPITAL_BASED_OUTPATIENT_CLINIC_OR_DEPARTMENT_OTHER): Payer: Medicare Other | Admitting: Hematology

## 2022-07-31 VITALS — BP 140/64 | HR 61 | Temp 98.4°F | Resp 18 | Wt 189.8 lb

## 2022-07-31 DIAGNOSIS — C9000 Multiple myeloma not having achieved remission: Secondary | ICD-10-CM

## 2022-07-31 DIAGNOSIS — Z5112 Encounter for antineoplastic immunotherapy: Secondary | ICD-10-CM | POA: Diagnosis not present

## 2022-07-31 LAB — CBC WITH DIFFERENTIAL (CANCER CENTER ONLY)
Abs Immature Granulocytes: 0.01 10*3/uL (ref 0.00–0.07)
Basophils Absolute: 0 10*3/uL (ref 0.0–0.1)
Basophils Relative: 0 %
Eosinophils Absolute: 0.1 10*3/uL (ref 0.0–0.5)
Eosinophils Relative: 4 %
HCT: 35.6 % — ABNORMAL LOW (ref 36.0–46.0)
Hemoglobin: 11.8 g/dL — ABNORMAL LOW (ref 12.0–15.0)
Immature Granulocytes: 0 %
Lymphocytes Relative: 41 %
Lymphs Abs: 1.5 10*3/uL (ref 0.7–4.0)
MCH: 32.2 pg (ref 26.0–34.0)
MCHC: 33.1 g/dL (ref 30.0–36.0)
MCV: 97.3 fL (ref 80.0–100.0)
Monocytes Absolute: 0.3 10*3/uL (ref 0.1–1.0)
Monocytes Relative: 8 %
Neutro Abs: 1.6 10*3/uL — ABNORMAL LOW (ref 1.7–7.7)
Neutrophils Relative %: 47 %
Platelet Count: 176 10*3/uL (ref 150–400)
RBC: 3.66 MIL/uL — ABNORMAL LOW (ref 3.87–5.11)
RDW: 14.9 % (ref 11.5–15.5)
WBC Count: 3.5 10*3/uL — ABNORMAL LOW (ref 4.0–10.5)
nRBC: 0 % (ref 0.0–0.2)

## 2022-07-31 LAB — CMP (CANCER CENTER ONLY)
ALT: 16 U/L (ref 0–44)
AST: 12 U/L — ABNORMAL LOW (ref 15–41)
Albumin: 4.1 g/dL (ref 3.5–5.0)
Alkaline Phosphatase: 81 U/L (ref 38–126)
Anion gap: 4 — ABNORMAL LOW (ref 5–15)
BUN: 10 mg/dL (ref 8–23)
CO2: 32 mmol/L (ref 22–32)
Calcium: 9.2 mg/dL (ref 8.9–10.3)
Chloride: 107 mmol/L (ref 98–111)
Creatinine: 0.68 mg/dL (ref 0.44–1.00)
GFR, Estimated: >60 mL/min (ref >60)
Glucose, Bld: 90 mg/dL (ref 70–99)
Potassium: 3.8 mmol/L (ref 3.5–5.1)
Sodium: 143 mmol/L (ref 135–145)
Total Bilirubin: 0.6 mg/dL (ref 0.3–1.2)
Total Protein: 6.5 g/dL (ref 6.5–8.1)

## 2022-07-31 MED ORDER — BORTEZOMIB CHEMO SQ INJECTION 3.5 MG (2.5MG/ML)
1.3000 mg/m2 | Freq: Once | INTRAMUSCULAR | Status: AC
  Administered 2022-07-31: 2.5 mg via SUBCUTANEOUS
  Filled 2022-07-31: qty 1

## 2022-07-31 MED ORDER — DARATUMUMAB-HYALURONIDASE-FIHJ 1800-30000 MG-UT/15ML ~~LOC~~ SOLN
1800.0000 mg | Freq: Once | SUBCUTANEOUS | Status: AC
  Administered 2022-07-31: 1800 mg via SUBCUTANEOUS
  Filled 2022-07-31: qty 15

## 2022-07-31 NOTE — Progress Notes (Signed)
Pt informed this RN she took her prescribed premedications at home prior to infusion at [facility].

## 2022-07-31 NOTE — Patient Instructions (Signed)
[facility] MEDICAL ONCOLOGY  Discharge Instructions: Thank you for choosing [facility] to provide your oncology and hematology care.   If you have a lab appointment with the Cancer Center, please go directly to the Cancer Center and check in at the registration area.   Wear comfortable clothing and clothing appropriate for easy access to any Portacath or PICC line.   We strive to give you quality time with your provider. You may need to reschedule your appointment if you arrive late (15 or more minutes).  Arriving late affects you and other patients whose appointments are after yours.  Also, if you miss three or more appointments without notifying the office, you may be dismissed from the clinic at the provider's discretion.      For prescription refill requests, have your pharmacy contact our office and allow 72 hours for refills to be completed.    Today you received the following chemotherapy and/or immunotherapy agents: Velcade/Darzalex Faspro      To help prevent nausea and vomiting after your treatment, we encourage you to take your nausea medication as directed.  BELOW ARE SYMPTOMS THAT SHOULD BE REPORTED IMMEDIATELY: *FEVER GREATER THAN 100.4 F (38 C) OR HIGHER *CHILLS OR SWEATING *NAUSEA AND VOMITING THAT IS NOT CONTROLLED WITH YOUR NAUSEA MEDICATION *UNUSUAL SHORTNESS OF BREATH *UNUSUAL BRUISING OR BLEEDING *URINARY PROBLEMS (pain or burning when urinating, or frequent urination) *BOWEL PROBLEMS (unusual diarrhea, constipation, pain near the anus) TENDERNESS IN MOUTH AND THROAT WITH OR WITHOUT PRESENCE OF ULCERS (sore throat, sores in mouth, or a toothache) UNUSUAL RASH, SWELLING OR PAIN  UNUSUAL VAGINAL DISCHARGE OR ITCHING   Items with * indicate a potential emergency and should be followed up as soon as possible or go to the Emergency Department if any problems should occur.  Please show the CHEMOTHERAPY ALERT CARD or IMMUNOTHERAPY ALERT CARD  at check-in to the Emergency Department and triage nurse.  Should you have questions after your visit or need to cancel or reschedule your appointment, please contact [facility] MEDICAL ONCOLOGY  Dept: [phone]  and follow the prompts.  Office hours are 8:00 a.m. to 4:30 p.m. Monday - Friday. Please note that voicemails left after 4:00 p.m. may not be returned until the following business day.  We are closed weekends and major holidays. You have access to a nurse at all times for urgent questions. Please call the main number to the clinic Dept: [phone] and follow the prompts.   For any non-urgent questions, you may also contact your provider using MyChart. We now offer e-Visits for anyone 18 and older to request care online for non-urgent symptoms. For details visit [email or web address].   Also download the MyChart app! Go to the app store, search "MyChart", open the app, select [facility], and log in with your MyChart username and password.  Masks are optional in the cancer centers. If you would like for your care team to wear a mask while they are taking care of you, please let them know. You may have one support person who is at least 65 years old accompany you for your appointments. 

## 2022-07-31 NOTE — Progress Notes (Signed)
Ok to proceed with Daratumumab today per MD.  Acquanetta Belling, RPH, BCPS, BCOP 07/31/2022  9:57 AM

## 2022-07-31 NOTE — Progress Notes (Addendum)
[facility]   [phone] [phone]   Clinic Follow up Note   Patient Care Team: Unk Pinto, MD as PCP - General (Internal Medicine) Sable Feil, MD as Consulting Physician (Gastroenterology) Camillo Flaming, [place] as Referring Physician (Optometry) Fay Records, MD as Consulting Physician (Cardiology) Daryll Brod, MD as Consulting Physician (Orthopedic Surgery) [name], MD as Referring Physician (Hematology and Oncology)  Date of Service:  07/31/2022  CHIEF COMPLAINT: f/u of multiple myeloma  CURRENT THERAPY:  DRVd (daratumumab, Revlimid, Velcade, and dexamethasone), starting 05/15/22             -Revlimid dose: 38m (dose reduced due to neutropenia) days 1-14 every 21 days             -DVd weekly  ASSESSMENT & PLAN:  Alyssa Dudley a 66y.o. female with   1. Multiple Myeloma, IgG lambda type, stage II, high risk  -she presented with new onset anemia and proteinuria, no bone pain  -labs obtained on 04/24/22 showed significantly elevated M protein at 4.5, IgG at 6,049, with significantly elevated lambda free light chain at 129142mL, normal kappa light chain, 24hr urine obtained 04/26/22 showed significant elevation of total protein to 11,71028m4hr  -bone marrow biopsy on 05/01/22 confirmed plasma cell myeloma (60% plasma cells). -she meets criteria of CRAB (anemia and bone lesions on PET), normal creatinine and calcium level -PET scan performed 05/07/22, showed: diffuse hypermetabolic bone marrow activity throughout skeleton. -cytogenetics show normal karyotype and FISH panel, and deletion of 1P and gain of 1 q. which is a higher risk feature.  -she began Revlimid daily x14 days (then 7 days off) and weekly DVd (dara, velcade, and dex) on 05/15/22. She tolerated well overall with very mild side effects. -she saw Dr. LamAris Lot 05/29/22 to discuss bone marrow transplant. She is felt to be a good candidate. -MM labs from  07/23/22 reviewed, her lambda light chain continues to decrease, down to 1,694.9. Her IgG is now WNL, and M protein is down to 0.8. Latest 24hr urine from 07/26/22 showed great improvement with M-spike negative and lambda light chain down to 20.71. She has had good partial response (close to VGPR) so far -Labs from today also reviewed, CBC shows ANC 1.6, hgb 11.8. She will continue 67m33mvlimid dose due to neutropenia. Will proceed with C4D15 DVd today as scheduled. -will discuss with Dr. LambAris Lotdecide if she needs 1-2 more cycle chemo before ASCT, or change her treatment give her suboptimal response    2.  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 Repeat on 04/19/22 showed further drop to 10.1, with iron 126 and ferritin 357.  No lab evidence of iron deficiency or B12 deficiency.  -most recent colonoscopy from 07/28/21 was benign, recall due 2027. POC cards obtained 04/20/22 were all negative.  -hgb improved to 11.8 today (07/31/22)   3.  Atrial fibrillation, hypertension, status post pacemaker placement      PLAN:  -proceed with DVd today -continue Revlimid 67mg65m 1-14 every 21 days  -lab and DVd next week, continue Velcade and dexa weekly for now and change Dara to maintenance every 4 weeks -I reached out to Dr. LambiAris Lott her next treatment plan, he will review her case in their hem conference and get back to me -f/u every 2 weeks -will reach out to Dr. LambiAris Loto problem-specific Assessment &  Plan notes found for this encounter.   SUMMARY OF ONCOLOGIC HISTORY: Oncology History  Multiple myeloma ([place])  05/01/2022 Cancer Staging   Staging form: Plasma Cell Myeloma and Plasma Cell Disorders, AJCC 8th Edition - Clinical stage from 05/01/2022: RISS Stage II (Beta-2-microglobulin (mg/L): 3.2, Albumin (g/dL): 3.6, ISS: Stage I, High-risk cytogenetics: Absent, LDH: Elevated) - Signed by [name],  MD on 06/03/2022 Stage prefix: Initial diagnosis Beta 2 microglobulin range (mg/L): Less than 3.5 Albumin range (g/dL): Greater than or equal to 3.5 Cytogenetics: 1p deletion   05/08/2022 Initial Diagnosis   Multiple myeloma ([place])   05/14/2022 -  Chemotherapy   Patient is on Treatment Plan : Verdon (Daratumumab IV) q21d x 6 Cycles (Induction/Consolidation)     05/15/2022 - 05/28/2022 Chemotherapy   Patient is on Treatment Plan : MYELOMA NEWLY DIAGNOSED TRANSPLANT CANDIDATE DaraVRd (Daratumumab IV) q21d x 6 Cycles (Induction/Consolidation)        INTERVAL HISTORY:  [name] is here for a follow up of MM. She was last seen by me on 07/17/22. She was seen in the infusion area. She reports she is doing well overall. She notes a new burning to the bottom of her feet, only with prolonged standing. She denies numbness.   All other systems were reviewed with the patient and are negative.  MEDICAL HISTORY:  Past Medical History:  Diagnosis Date   Allergy    once a year    Hyperlipidemia    borderline- no meds    Pacemaker    Paroxysmal atrial fibrillation ([place]) 6/15   detected on ppm interrogation   PONV (postoperative nausea and vomiting)    has vomitted in past   Second degree Mobitz II AV block    Vitamin D deficiency     SURGICAL HISTORY: Past Surgical History:  Procedure Laterality Date   BREAST CYST EXCISION  1980   COLONOSCOPY     EXTRACORPOREAL SHOCK WAVE LITHOTRIPSY Right 12/28/2019   Procedure: RIGHT EXTRACORPOREAL SHOCK WAVE LITHOTRIPSY (ESWL);  Surgeon: Raynelle Bring, MD;  Location: [facility];  Service: Urology;  Laterality: Right;   PERMANENT PACEMAKER INSERTION N/A 01/13/2013   SJM Accent DR RF implanted by Dr [name] for mobitz II AV block   PLANTAR FASCIECTOMY     POLYPECTOMY     WRIST SURGERY Right 2011   removed cyst    I have reviewed the social history and family history with the patient and  they are unchanged from previous note.  ALLERGIES:  has No Known Allergies.  MEDICATIONS:  Current Outpatient Medications  Medication Sig Dispense Refill   acyclovir (ZOVIRAX) 400 MG tablet Take 1 tablet (400 mg total) by mouth 2 (two) times daily. 60 tablet 2   aspirin 81 MG chewable tablet Chew by mouth daily.     atenolol (TENORMIN) 50 MG tablet Take  1 tablet  Daily  for BP 90 tablet 3   chlorhexidine (PERIDEX) 0.12 % solution 10 mLs as needed.     dexamethasone (DECADRON) 4 MG tablet Take 5 tablets (20 mg total) by mouth once a week. 1-2 hours before chemo injections weekly 30 tablet 1   lenalidomide (REVLIMID) 10 MG capsule Take 2 capsules (20 mg total) by mouth daily. Take 2 capsules (28m total) by mouth daily for day 1-14  every 21 days. [employer] Auth # [id number]    Date Obtained 07/19/2022 28 capsule 0   triamcinolone cream (KENALOG) 0.1 % APPLY EXTERNALLY TO THE AFFECTED AREA  THREE TIMES DAILY AS NEEDED FOR RASH 30 g 2   Vitamin D, Ergocalciferol, (DRISDOL) 1.25 MG (50000 UNIT) CAPS capsule TAKE 1 CAPSULE BY MOUTH TWICE WEEKLY 180 capsule 0   Current Facility-Administered Medications  Medication Dose Route Frequency Provider Last Rate Last Admin   0.9 %  sodium chloride infusion  500 mL Intravenous Once Pyrtle, Lajuan Lines, MD        PHYSICAL EXAMINATION: ECOG PERFORMANCE STATUS: 1 - Symptomatic but completely ambulatory  There were no vitals filed for this visit. Wt Readings from Last 3 Encounters:  07/31/22 189 lb 12 oz (86.1 kg)  07/23/22 187 lb 1 oz (84.9 kg)  07/17/22 188 lb 11.2 oz (85.6 kg)     GENERAL:alert, no distress and comfortable SKIN: skin color normal, no rashes or significant lesions EYES: normal, Conjunctiva are pink and non-injected, sclera clear  NEURO: alert & oriented x 3 with fluent speech  LABORATORY DATA:  I have reviewed the data as listed    Latest Ref Rng & Units 07/31/2022    8:46 AM 07/23/2022    8:30 AM 07/17/2022    1:57 PM  CBC  WBC 4.0  - 10.5 K/uL 3.5  4.6  3.7   Hemoglobin 12.0 - 15.0 g/dL 11.8  11.7  10.8   Hematocrit 36.0 - 46.0 % 35.6  34.8  32.1   Platelets 150 - 400 K/uL 176  212  174         Latest Ref Rng & Units 07/31/2022    8:46 AM 07/23/2022    8:30 AM 07/17/2022    1:57 PM  CMP  Glucose 70 - 99 mg/dL 90  93  101   BUN 8 - 23 mg/dL '10  13  15   ' Creatinine 0.44 - 1.00 mg/dL 0.68  0.67  0.76   Sodium 135 - 145 mmol/L 143  141  141   Potassium 3.5 - 5.1 mmol/L 3.8  4.1  3.5   Chloride 98 - 111 mmol/L 107  108  109   CO2 22 - 32 mmol/L 32  28  29   Calcium 8.9 - 10.3 mg/dL 9.2  9.2  8.8   Total Protein 6.5 - 8.1 g/dL 6.5  6.8  6.5   Total Bilirubin 0.3 - 1.2 mg/dL 0.6  0.7  0.6   Alkaline Phos 38 - 126 U/L 81  97  89   AST 15 - 41 U/L '12  12  14   ' ALT 0 - 44 U/L '16  17  16       ' RADIOGRAPHIC STUDIES: I have personally reviewed the radiological images as listed and agreed with the findings in the report. No results found.    No orders of the defined types were placed in this encounter.  All questions were answered. The patient knows to call the clinic with any problems, questions or concerns. No barriers to learning was detected. The total time spent in the appointment was 30 minutes.     [name], MD 07/31/2022   I, Wilburn Mylar, am acting as scribe for [name], MD.   I have reviewed the above documentation for accuracy and completeness, and I agree with the above.

## 2022-08-06 ENCOUNTER — Inpatient Hospital Stay: Payer: Medicare Other

## 2022-08-06 ENCOUNTER — Other Ambulatory Visit: Payer: Self-pay

## 2022-08-06 VITALS — BP 135/88 | HR 75 | Temp 97.9°F | Resp 18 | Wt 189.9 lb

## 2022-08-06 DIAGNOSIS — C9000 Multiple myeloma not having achieved remission: Secondary | ICD-10-CM

## 2022-08-06 DIAGNOSIS — D649 Anemia, unspecified: Secondary | ICD-10-CM

## 2022-08-06 DIAGNOSIS — Z5112 Encounter for antineoplastic immunotherapy: Secondary | ICD-10-CM | POA: Diagnosis not present

## 2022-08-06 LAB — CBC WITH DIFFERENTIAL (CANCER CENTER ONLY)
Abs Immature Granulocytes: 0.01 10*3/uL (ref 0.00–0.07)
Basophils Absolute: 0 10*3/uL (ref 0.0–0.1)
Basophils Relative: 1 %
Eosinophils Absolute: 0.1 10*3/uL (ref 0.0–0.5)
Eosinophils Relative: 2 %
HCT: 34.5 % — ABNORMAL LOW (ref 36.0–46.0)
Hemoglobin: 11.5 g/dL — ABNORMAL LOW (ref 12.0–15.0)
Immature Granulocytes: 0 %
Lymphocytes Relative: 45 %
Lymphs Abs: 1.4 10*3/uL (ref 0.7–4.0)
MCH: 31.9 pg (ref 26.0–34.0)
MCHC: 33.3 g/dL (ref 30.0–36.0)
MCV: 95.8 fL (ref 80.0–100.0)
Monocytes Absolute: 0.5 10*3/uL (ref 0.1–1.0)
Monocytes Relative: 17 %
Neutro Abs: 1.1 10*3/uL — ABNORMAL LOW (ref 1.7–7.7)
Neutrophils Relative %: 35 %
Platelet Count: 199 10*3/uL (ref 150–400)
RBC: 3.6 MIL/uL — ABNORMAL LOW (ref 3.87–5.11)
RDW: 14.3 % (ref 11.5–15.5)
WBC Count: 3.2 10*3/uL — ABNORMAL LOW (ref 4.0–10.5)
nRBC: 0 % (ref 0.0–0.2)

## 2022-08-06 LAB — CMP (CANCER CENTER ONLY)
ALT: 17 U/L (ref 0–44)
AST: 13 U/L — ABNORMAL LOW (ref 15–41)
Albumin: 4.1 g/dL (ref 3.5–5.0)
Alkaline Phosphatase: 84 U/L (ref 38–126)
Anion gap: 5 (ref 5–15)
BUN: 11 mg/dL (ref 8–23)
CO2: 30 mmol/L (ref 22–32)
Calcium: 9.1 mg/dL (ref 8.9–10.3)
Chloride: 107 mmol/L (ref 98–111)
Creatinine: 0.72 mg/dL (ref 0.44–1.00)
GFR, Estimated: >60 mL/min (ref >60)
Glucose, Bld: 89 mg/dL (ref 70–99)
Potassium: 3.7 mmol/L (ref 3.5–5.1)
Sodium: 142 mmol/L (ref 135–145)
Total Bilirubin: 0.6 mg/dL (ref 0.3–1.2)
Total Protein: 6.8 g/dL (ref 6.5–8.1)

## 2022-08-06 MED ORDER — BORTEZOMIB CHEMO SQ INJECTION 3.5 MG (2.5MG/ML)
1.3000 mg/m2 | Freq: Once | INTRAMUSCULAR | Status: AC
  Administered 2022-08-06: 2.5 mg via SUBCUTANEOUS
  Filled 2022-08-06: qty 1

## 2022-08-06 MED ORDER — LENALIDOMIDE 10 MG PO CAPS: 20.0000 mg | ORAL_CAPSULE | Freq: Every day | ORAL | 0 refills | Status: DC

## 2022-08-06 MED ORDER — DARATUMUMAB-HYALURONIDASE-FIHJ 1800-30000 MG-UT/15ML ~~LOC~~ SOLN
1800.0000 mg | Freq: Once | SUBCUTANEOUS | Status: AC
  Administered 2022-08-06: 1800 mg via SUBCUTANEOUS
  Filled 2022-08-06: qty 15

## 2022-08-06 NOTE — Patient Instructions (Signed)
[facility]COL[facility]  Discharge Instructions: Thank you for choosing [facility] to provide your oncology and hematology care.   If you have a lab appointment with the [facility], please go directly to the [facility] and check in at the registration area.   Wear comfortable clothing and clothing appropriate for easy access to any Portacath or PICC line.   We strive to give you quality time with your provider. You may need to reschedule your appointment if you arrive late (15 or more minutes).  Arriving late affects you and other patients whose appointments are after yours.  Also, if you miss three or more appointments without notifying the office, you may be dismissed from the clinic at the provider's discretion.      For prescription refill requests, have your pharmacy contact our office and allow 72 hours for refills to be completed.    Today you received the following chemotherapy and/or immunotherapy agents: Vidaza, Dara Faspro.       To help prevent nausea and vomiting after your treatment, we encourage you to take your nausea medication as directed.  BELOW ARE SYMPTOMS THAT SHOULD BE REPORTED IMMEDIATELY: *FEVER GREATER THAN 100.4 F (38 C) OR HIGHER *CHILLS OR SWEATING *NAUSEA AND VOMITING THAT IS NOT CONTROLLED WITH YOUR NAUSEA MEDICATION *UNUSUAL SHORTNESS OF BREATH *UNUSUAL BRUISING OR BLEEDING *URINARY PROBLEMS (pain or burning when urinating, or frequent urination) *BOWEL PROBLEMS (unusual diarrhea, constipation, pain near the anus) TENDERNESS IN MOUTH AND THROAT WITH OR WITHOUT PRESENCE OF ULCERS (sore throat, sores in mouth, or a toothache) UNUSUAL RASH, SWELLING OR PAIN  UNUSUAL VAGINAL DISCHARGE OR ITCHING   Items with * indicate a potential emergency and should be followed up as soon as possible or go to the Emergency Department if any problems should occur.  Please show the CHEMOTHERAPY ALERT CARD or IMMUNOTHERAPY ALERT CARD at  check-in to the Emergency Department and triage nurse.  Should you have questions after your visit or need to cancel or reschedule your appointment, please contact [facility]  Dept: [phone]  and follow the prompts.  Office hours are 8:00 a.m. to 4:30 p.m. Monday - Friday. Please note that voicemails left after 4:00 p.m. may not be returned until the following business day.  We are closed weekends and major holidays. You have access to a nurse at all times for urgent questions. Please call the main number to the clinic Dept: [phone] and follow the prompts.   For any non-urgent questions, you may also contact your provider using MyChart. We now offer e-Visits for anyone 11 and older to request care online for non-urgent symptoms. For details visit [email or web address].   Also download the MyChart app! Go to the app store, search "MyChart", open the app, select [facility], and log in with your MyChart username and password.  Masks are optional in the cancer centers. If you would like for your care team to wear a mask while they are taking care of you, please let them know. You may have one support person who is at least 65 years old accompany you for your appointments.

## 2022-08-06 NOTE — Progress Notes (Signed)
Pt reports taking pre-medications prior to arrival to clinic.   Per [place] plan, ok to treat with ANC<1.5

## 2022-08-07 LAB — KAPPA/LAMBDA LIGHT CHAINS
Kappa free light chain: 6.1 mg/L (ref 3.3–19.4)
Kappa, lambda light chain ratio: 0.01 — ABNORMAL LOW (ref 0.26–1.65)
Lambda free light chains: 810.9 mg/L — ABNORMAL HIGH (ref 5.7–26.3)

## 2022-08-09 ENCOUNTER — Other Ambulatory Visit: Payer: Self-pay | Admitting: Hematology

## 2022-08-09 ENCOUNTER — Other Ambulatory Visit (HOSPITAL_COMMUNITY): Payer: Self-pay

## 2022-08-09 ENCOUNTER — Telehealth: Payer: Self-pay | Admitting: Pharmacy Technician

## 2022-08-09 ENCOUNTER — Other Ambulatory Visit: Payer: Self-pay | Admitting: Pharmacist

## 2022-08-09 DIAGNOSIS — C9000 Multiple myeloma not having achieved remission: Secondary | ICD-10-CM

## 2022-08-09 MED ORDER — LENALIDOMIDE 20 MG PO CAPS: 20.0000 mg | ORAL_CAPSULE | Freq: Every day | ORAL | 0 refills | Status: DC

## 2022-08-09 NOTE — Telephone Encounter (Signed)
Oral Oncology Patient Advocate Encounter  After completing a benefits investigation, prior authorization for Lenalidomide '20mg'$  is not required at this time through [place] Medicare D.  Patient's copay is $498.71.     Lady Deutscher, CPhT-Adv Oncology Pharmacy Patient [facility] Direct Number: [phone]  Fax: [phone]

## 2022-08-09 NOTE — Progress Notes (Signed)
Oral Oncology Pharmacist Encounter  Patient dose reduced from lenalidomide 25 mg to 20 mg. Patient's insurance requires that patient utilize 20 mg capsule instead of two 10 mg capsules. Prescription updated and redirected to [facility].   Leron Croak, PharmD, BCPS, [facility] Hematology/Oncology Clinical Pharmacist [facility] and [facility] [phone] 08/09/2022 1:47 PM

## 2022-08-10 LAB — MULTIPLE MYELOMA PANEL, SERUM
Albumin SerPl Elph-Mcnc: 3.6 g/dL (ref 2.9–4.4)
Albumin/Glob SerPl: 1.5 (ref 0.7–1.7)
Alpha 1: 0.2 g/dL (ref 0.0–0.4)
Alpha2 Glob SerPl Elph-Mcnc: 0.7 g/dL (ref 0.4–1.0)
B-Globulin SerPl Elph-Mcnc: 0.9 g/dL (ref 0.7–1.3)
Gamma Glob SerPl Elph-Mcnc: 0.7 g/dL (ref 0.4–1.8)
Globulin, Total: 2.5 g/dL (ref 2.2–3.9)
IgA: 6 mg/dL — ABNORMAL LOW (ref 87–352)
IgG (Immunoglobin G), Serum: 770 mg/dL (ref 586–1602)
IgM (Immunoglobulin M), Srm: 11 mg/dL — ABNORMAL LOW (ref 26–217)
M Protein SerPl Elph-Mcnc: 0.6 g/dL — ABNORMAL HIGH
Total Protein ELP: 6.1 g/dL (ref 6.0–8.5)

## 2022-08-13 ENCOUNTER — Inpatient Hospital Stay: Payer: Medicare Other

## 2022-08-13 ENCOUNTER — Inpatient Hospital Stay (HOSPITAL_BASED_OUTPATIENT_CLINIC_OR_DEPARTMENT_OTHER): Payer: Medicare Other | Admitting: Hematology

## 2022-08-13 ENCOUNTER — Encounter: Payer: Self-pay | Admitting: Hematology

## 2022-08-13 ENCOUNTER — Inpatient Hospital Stay: Payer: Medicare Other | Attending: Hematology

## 2022-08-13 ENCOUNTER — Ambulatory Visit (INDEPENDENT_AMBULATORY_CARE_PROVIDER_SITE_OTHER): Payer: BC Managed Care – PPO

## 2022-08-13 ENCOUNTER — Other Ambulatory Visit: Payer: Self-pay

## 2022-08-13 VITALS — BP 170/72

## 2022-08-13 VITALS — BP 180/92 | HR 59 | Temp 98.2°F | Resp 17 | Ht 64.0 in | Wt 190.2 lb

## 2022-08-13 DIAGNOSIS — C9000 Multiple myeloma not having achieved remission: Secondary | ICD-10-CM

## 2022-08-13 DIAGNOSIS — Z5112 Encounter for antineoplastic immunotherapy: Secondary | ICD-10-CM | POA: Diagnosis not present

## 2022-08-13 DIAGNOSIS — D649 Anemia, unspecified: Secondary | ICD-10-CM

## 2022-08-13 DIAGNOSIS — I441 Atrioventricular block, second degree: Secondary | ICD-10-CM

## 2022-08-13 LAB — CMP (CANCER CENTER ONLY)
ALT: 17 U/L (ref 0–44)
AST: 11 U/L — ABNORMAL LOW (ref 15–41)
Albumin: 4.1 g/dL (ref 3.5–5.0)
Alkaline Phosphatase: 87 U/L (ref 38–126)
Anion gap: 5 (ref 5–15)
BUN: 11 mg/dL (ref 8–23)
CO2: 26 mmol/L (ref 22–32)
Calcium: 9 mg/dL (ref 8.9–10.3)
Chloride: 110 mmol/L (ref 98–111)
Creatinine: 0.65 mg/dL (ref 0.44–1.00)
GFR, Estimated: >60 mL/min (ref >60)
Glucose, Bld: 94 mg/dL (ref 70–99)
Potassium: 3.9 mmol/L (ref 3.5–5.1)
Sodium: 141 mmol/L (ref 135–145)
Total Bilirubin: 0.8 mg/dL (ref 0.3–1.2)
Total Protein: 6.5 g/dL (ref 6.5–8.1)

## 2022-08-13 LAB — CBC WITH DIFFERENTIAL (CANCER CENTER ONLY)
Abs Immature Granulocytes: 0.01 10*3/uL (ref 0.00–0.07)
Basophils Absolute: 0 10*3/uL (ref 0.0–0.1)
Basophils Relative: 1 %
Eosinophils Absolute: 0.1 10*3/uL (ref 0.0–0.5)
Eosinophils Relative: 3 %
HCT: 35 % — ABNORMAL LOW (ref 36.0–46.0)
Hemoglobin: 11.7 g/dL — ABNORMAL LOW (ref 12.0–15.0)
Immature Granulocytes: 0 %
Lymphocytes Relative: 46 %
Lymphs Abs: 2.1 10*3/uL (ref 0.7–4.0)
MCH: 31.5 pg (ref 26.0–34.0)
MCHC: 33.4 g/dL (ref 30.0–36.0)
MCV: 94.3 fL (ref 80.0–100.0)
Monocytes Absolute: 0.8 10*3/uL (ref 0.1–1.0)
Monocytes Relative: 17 %
Neutro Abs: 1.6 10*3/uL — ABNORMAL LOW (ref 1.7–7.7)
Neutrophils Relative %: 33 %
Platelet Count: 208 10*3/uL (ref 150–400)
RBC: 3.71 MIL/uL — ABNORMAL LOW (ref 3.87–5.11)
RDW: 14.8 % (ref 11.5–15.5)
WBC Count: 4.7 10*3/uL (ref 4.0–10.5)
nRBC: 0 % (ref 0.0–0.2)

## 2022-08-13 MED ORDER — BORTEZOMIB CHEMO SQ INJECTION 3.5 MG (2.5MG/ML)
1.3000 mg/m2 | Freq: Once | INTRAMUSCULAR | Status: AC
  Administered 2022-08-13: 2.5 mg via SUBCUTANEOUS
  Filled 2022-08-13: qty 1

## 2022-08-13 NOTE — Patient Instructions (Signed)
[facility] ONCOL[facility]  Discharge Instructions: Thank you for choosing [facility] to provide your oncology and hematology care.   If you have a lab appointment with the [facility], please go directly to the [facility] and check in at the registration area.   Wear comfortable clothing and clothing appropriate for easy access to any Portacath or PICC line.   We strive to give you quality time with your provider. You may need to reschedule your appointment if you arrive late (15 or more minutes).  Arriving late affects you and other patients whose appointments are after yours.  Also, if you miss three or more appointments without notifying the office, you may be dismissed from the clinic at the provider's discretion.      For prescription refill requests, have your pharmacy contact our office and allow 72 hours for refills to be completed.    Today you received the following chemotherapy and/or immunotherapy agents: bortezomib      To help prevent nausea and vomiting after your treatment, we encourage you to take your nausea medication as directed.  BELOW ARE SYMPTOMS THAT SHOULD BE REPORTED IMMEDIATELY: *FEVER GREATER THAN 100.4 F (38 C) OR HIGHER *CHILLS OR SWEATING *NAUSEA AND VOMITING THAT IS NOT CONTROLLED WITH YOUR NAUSEA MEDICATION *UNUSUAL SHORTNESS OF BREATH *UNUSUAL BRUISING OR BLEEDING *URINARY PROBLEMS (pain or burning when urinating, or frequent urination) *BOWEL PROBLEMS (unusual diarrhea, constipation, pain near the anus) TENDERNESS IN MOUTH AND THROAT WITH OR WITHOUT PRESENCE OF ULCERS (sore throat, sores in mouth, or a toothache) UNUSUAL RASH, SWELLING OR PAIN  UNUSUAL VAGINAL DISCHARGE OR ITCHING   Items with * indicate a potential emergency and should be followed up as soon as possible or go to the Emergency Department if any problems should occur.  Please show the CHEMOTHERAPY ALERT CARD or IMMUNOTHERAPY ALERT CARD at check-in to  the Emergency Department and triage nurse.  Should you have questions after your visit or need to cancel or reschedule your appointment, please contact [facility]  Dept: [phone]  and follow the prompts.  Office hours are 8:00 a.m. to 4:30 p.m. Monday - Friday. Please note that voicemails left after 4:00 p.m. may not be returned until the following business day.  We are closed weekends and major holidays. You have access to a nurse at all times for urgent questions. Please call the main number to the clinic Dept: [phone] and follow the prompts.   For any non-urgent questions, you may also contact your provider using MyChart. We now offer e-Visits for anyone 29 and older to request care online for non-urgent symptoms. For details visit [email or web address].   Also download the MyChart app! Go to the app store, search "MyChart", open the app, select [facility], and log in with your MyChart username and password.  Masks are optional in the cancer centers. If you would like for your care team to wear a mask while they are taking care of you, please let them know. You may have one support person who is at least 65 years old accompany you for your appointments.

## 2022-08-13 NOTE — Progress Notes (Addendum)
Alyssa Dudley   [phone] [phone]   Clinic Follow up Note   Patient Care Team: Unk Pinto, MD as PCP - General (Internal Medicine) Sable Feil, MD as Consulting Physician (Gastroenterology) Camillo Flaming, [place] as Referring Physician (Optometry) Fay Records, MD as Consulting Physician (Cardiology) Daryll Brod, MD as Consulting Physician (Orthopedic Surgery) [name], MD as Referring Physician (Hematology and Oncology)  Date of Service:  08/13/2022  CHIEF COMPLAINT: f/u of multiple myeloma  CURRENT THERAPY:  DRVd (daratumumab, Revlimid, Velcade, and dexamethasone), starting 05/15/22             -Revlimid dose: 81m (dose reduced due to neutropenia) days 1-14 every 21 days             -velcade weekly  -daratumumab [id number] then changed to q28d on 08/06/2022  ASSESSMENT & PLAN:  Zakiyah JTawneyis a 66y.o. female with   1. Multiple Myeloma, IgG lambda type, stage II, high risk  -she presented with new onset anemia and proteinuria, no bone pain  -labs obtained on 04/24/22 showed significantly elevated M protein at 4.5, IgG at 6,049, with significantly elevated lambda free light chain at 129128mL, normal kappa light chain, 24hr urine obtained 04/26/22 showed significant elevation of total protein to 11,71042m4hr  -bone marrow biopsy on 05/01/22 confirmed plasma cell myeloma (60% plasma cells). -she meets criteria of CRAB (anemia and bone lesions on PET), normal creatinine and calcium level -PET scan performed 05/07/22, showed: diffuse hypermetabolic bone marrow activity throughout skeleton. -cytogenetics show normal karyotype and FISH panel, and deletion of 1P and gain of 1 q. which is a higher risk feature.  -she began Revlimid daily x14 days (then 7 days off) and weekly DVd (dara, velcade, and dex) on 05/15/22. She tolerated well overall with very mild side effects. -she saw Dr. LamAris Lot 05/29/22 to discuss bone  marrow transplant. She is felt to be a good candidate. -Latest 24hr urine from 07/26/22 showed great improvement with M-spike negative and lambda light chain down to 20.71. She has had good partial response (close to VGPR) so far -I reviewed her situation with Dr. LamAris Lotho presented her case in their hematology conference. Per discussion, the recommendation is for a few more cycles before she moves to ASCT. I reassured her that the plan is still to move forward with bone marrow transplant just after a few more cycles; I do not plan to give more than 6 cycles. I encouraged her to reach out to Dr. LamAris Lotr f/u. -MM labs from 08/06/22 reviewed, her lambda light chain continues to decrease, down to 810.9. Her IgG is now WNL, and M protein is down to 0.6. -Labs from today also reviewed, CBC shows ANC 1.6, hgb 11.7. She will continue 19m81mvlimid dose due to neutropenia. I will also change her daratumumab to every 4 weeks. Will proceed with C5D8 velcade today as scheduled.   2.  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 Repeat on 04/19/22 showed further drop to 10.1, with iron 126 and ferritin 357.  No lab evidence of iron deficiency or B12 deficiency.  -most recent colonoscopy from 07/28/21 was benign, recall due 2027. POC cards obtained 04/20/22 were all negative.  -hgb improved to 11.8 today (07/31/22)   3.  Atrial fibrillation, hypertension, status post pacemaker placement      PLAN:  -proceed with velcade today -continue Revlimid 19mg42m  1-14 every 21 days  -lab, Velcade, and dexa weekly and Dara every 4 weeks -she will reach out to Dr. Gaynelle Adu office for f/u -f/u every 2 weeks   No problem-specific Assessment & Plan notes found for this encounter.   SUMMARY OF ONCOLOGIC HISTORY: Oncology History  Multiple myeloma ([place])  05/01/2022 Cancer Staging   Staging form: Plasma Cell Myeloma and Plasma  Cell Disorders, AJCC 8th Edition - Clinical stage from 05/01/2022: RISS Stage II (Beta-2-microglobulin (mg/L): 3.2, Albumin (g/dL): 3.6, ISS: Stage I, High-risk cytogenetics: Absent, LDH: Elevated) - Signed by [name], MD on 06/03/2022 Stage prefix: Initial diagnosis Beta 2 microglobulin range (mg/L): Less than 3.5 Albumin range (g/dL): Greater than or equal to 3.5 Cytogenetics: 1p deletion   05/08/2022 Initial Diagnosis   Multiple myeloma ([place])   05/14/2022 -  Chemotherapy   Patient is on Treatment Plan : [place] (Daratumumab IV) q21d x 6 Cycles (Induction/Consolidation)     05/15/2022 - 05/28/2022 Chemotherapy   Patient is on Treatment Plan : MYELOMA NEWLY DIAGNOSED TRANSPLANT CANDIDATE DaraVRd (Daratumumab IV) q21d x 6 Cycles (Induction/Consolidation)        INTERVAL HISTORY:  [name] is here for a follow up of MM. She was last seen by me on 07/31/22. She presents to the clinic accompanied by her husband. She reports she continues to tolerate treatment well.   All other systems were reviewed with the patient and are negative.  MEDICAL HISTORY:  Past Medical History:  Diagnosis Date   Allergy    once a year    Hyperlipidemia    borderline- no meds    Pacemaker    Paroxysmal atrial fibrillation ([place]) 6/15   detected on ppm interrogation   PONV (postoperative nausea and vomiting)    has vomitted in past   Second degree Mobitz II AV block    Vitamin D deficiency     SURGICAL HISTORY: Past Surgical History:  Procedure Laterality Date   BREAST CYST EXCISION  1980   COLONOSCOPY     EXTRACORPOREAL SHOCK WAVE LITHOTRIPSY Right 12/28/2019   Procedure: RIGHT EXTRACORPOREAL SHOCK WAVE LITHOTRIPSY (ESWL);  Surgeon: Raynelle Bring, MD;  [facility];  Service: Urology;  Laterality: Right;   PERMANENT PACEMAKER INSERTION N/A 01/13/2013   SJM Acc[employer] DR RF implanted by Dr [name] for mobitz II AV block   PLANTAR  FASCIECTOMY     POLYPECTOMY     WRIST SURGERY Right 2011   removed cyst    I have reviewed the social history and family history with the patient and they are unchanged from previous note.  ALLERGIES:  has No Known Allergies.  MEDICATIONS:  Current Outpatient Medications  Medication Sig Dispense Refill   acyclovir (ZOVIRAX) 400 MG tablet Take 1 tablet (400 mg total) by mouth 2 (two) times daily. 60 tablet 2   aspirin 81 MG chewable tablet Chew by mouth daily.     atenolol (TENORMIN) 50 MG tablet Take  1 tablet  Daily  for BP 90 tablet 3   chlorhexidine (PERIDEX) 0.12 % solution 10 mLs as needed.     dexamethasone (DECADRON) 4 MG tablet Take 5 tablets (20 mg total) by mouth once a week. 1-2 hours before chemo injections weekly 30 tablet 1   lenalidomide (REVLIMID) 20 MG capsule Take 1 capsule (20 mg total) by mouth daily. Take for 14 days on, 7 days off. Repeat every 21 days. [employer] Auth # [id number]; Date Obtained 08/06/22 14 capsule 0  triamcinolone cream (KENALOG) 0.1 % APPLY EXTERNALLY TO THE AFFECTED AREA THREE TIMES DAILY AS NEEDED FOR RASH 30 g 2   Vitamin D, Ergocalciferol, (DRISDOL) 1.25 MG (50000 UNIT) CAPS capsule TAKE 1 CAPSULE BY MOUTH TWICE WEEKLY 180 capsule 0   Current Facility-Administered Medications  Medication Dose Route Frequency Provider Last Rate Last Admin   0.9 %  sodium chloride infusion  500 mL Intravenous Once Pyrtle, Lajuan Lines, MD        PHYSICAL EXAMINATION: ECOG PERFORMANCE STATUS: 0 - Asymptomatic  Vitals:   08/13/22 1035  BP: (!) 180/92  Pulse: (!) 59  Resp: 17  Temp: 98.2 F (36.8 C)  SpO2: 100%   Wt Readings from Last 3 Encounters:  08/13/22 190 lb 3.2 oz (86.3 kg)  08/06/22 189 lb 14.4 oz (86.1 kg)  07/31/22 189 lb 12 oz (86.1 kg)     GENERAL:alert, no distress and comfortable SKIN: skin color normal, no rashes or significant lesions EYES: normal, Conjunctiva are pink and non-injected, sclera clear  NEURO: alert & oriented x 3 with fluent  speech  LABORATORY DATA:  I have reviewed the data as listed    Latest Ref Rng & Units 08/13/2022    9:48 AM 08/06/2022    8:46 AM 07/31/2022    8:46 AM  CBC  WBC 4.0 - 10.5 K/uL 4.7  3.2  3.5   Hemoglobin 12.0 - 15.0 g/dL 11.7  11.5  11.8   Hematocrit 36.0 - 46.0 % 35.0  34.5  35.6   Platelets 150 - 400 K/uL 208  199  176         Latest Ref Rng & Units 08/13/2022    9:48 AM 08/06/2022    8:46 AM 07/31/2022    8:46 AM  CMP  Glucose 70 - 99 mg/dL 94  89  90   BUN 8 - 23 mg/dL _0 Creatinine 0.44 - 1.00 mg/dL 0.65  0.72  0.68   Sodium 135 - 145 mmol/L 141  142  143   Potassium 3.5 - 5.1 mmol/L 3.9  3.7  3.8   Chloride 98 - 111 mmol/L 110  107  107   CO2 22 - 32 mmol/L 26  30  32   Calcium 8.9 - 10.3 mg/dL 9.0  9.1  9.2   Total Protein 6.5 - 8.1 g/dL 6.5  6.8  6.5   Total Bilirubin 0.3 - 1.2 mg/dL 0.8  0.6  0.6   Alkaline Phos 38 - 126 U/L 87  84  81   AST 15 - 41 U/L _1 ALT 0 - 44 U/L _2 RADIOGRAPHIC STUDIES: I have personally reviewed the radiological images as listed and agreed with the findings in the report. No results found.    Orders Placed This Encounter  Procedures   CBC with Differential ([place] Only)    Standing Status:   Future    Standing Expiration Date:   08/28/2023   CMP ([place] only)    Standing Status:   Future    Standing Expiration Date:   08/28/2023   CBC with Differential ([place] Only)    Standing Status:   Future    Standing Expiration Date:   08/29/2023   CMP ([place] only)    Standing Status:   Future    Standing Expiration Date:   08/29/2023   CBC with  Differential (Cancer Center Only)    Standing Status:   Future    Standing Expiration Date:   09/05/2023   CMP ([place] only)    Standing Status:   Future    Standing Expiration Date:   09/05/2023   CBC with Differential ([place] Only)    Standing Status:   Future    Standing Expiration Date:   09/12/2023   CMP  ([place] only)    Standing Status:   Future    Standing Expiration Date:   09/12/2023   CBC with Differential ([place] Only)    Standing Status:   Future    Standing Expiration Date:   09/19/2023   CMP ([place] only)    Standing Status:   Future    Standing Expiration Date:   09/19/2023   All questions were answered. The patient knows to call the clinic with any problems, questions or concerns. No barriers to learning was detected. The total time spent in the appointment was 30 minutes.     [name], MD 08/13/2022   I, Wilburn Mylar, am acting as scribe for [name], MD.   I have reviewed the above documentation for accuracy and completeness, and I agree with the above.

## 2022-08-13 NOTE — Progress Notes (Signed)
OK to treat w/ elevated BP per Dr. Burr Medico.  RN will repeat BP before she leaves and let her monitor at home as well.  Kennith [place], Pharm.D., CPP 08/13/2022'@11'$ :06 AM

## 2022-08-13 NOTE — Progress Notes (Signed)
Patient reports she took '20mg'$  Decadron at home prior to arrival

## 2022-08-14 LAB — KAPPA/LAMBDA LIGHT CHAINS
Kappa free light chain: 4.9 mg/L (ref 3.3–19.4)
Kappa, lambda light chain ratio: 0.01 — ABNORMAL LOW (ref 0.26–1.65)
Lambda free light chains: 635.6 mg/L — ABNORMAL HIGH (ref 5.7–26.3)

## 2022-08-15 LAB — CUP PACEART REMOTE DEVICE CHECK
Battery Remaining Longevity: 12 mo
Battery Remaining Percentage: 9 %
Battery Voltage: 2.81 V
Brady Statistic AP VP Percent: 63 %
Brady Statistic AP VS Percent: 1 %
Brady Statistic AS VP Percent: 36 %
Brady Statistic AS VS Percent: 1 %
Brady Statistic RA Percent Paced: 63 %
Brady Statistic RV Percent Paced: 99 %
Date Time Interrogation Session: 20231107095512
Implantable Lead Connection Status: 753985
Implantable Lead Connection Status: 753985
Implantable Lead Implant Date: 20140408
Implantable Lead Implant Date: 20140408
Implantable Lead Location: 753859
Implantable Lead Location: 753860
Implantable Lead Model: 1944
Implantable Lead Model: 1948
Implantable Pulse Generator Implant Date: 20140408
Lead Channel Impedance Value: 440 Ohm
Lead Channel Impedance Value: 560 Ohm
Lead Channel Pacing Threshold Amplitude: 0.5 V
Lead Channel Pacing Threshold Amplitude: 0.75 V
Lead Channel Pacing Threshold Pulse Width: 0.4 ms
Lead Channel Pacing Threshold Pulse Width: 0.4 ms
Lead Channel Sensing Intrinsic Amplitude: 12 mV
Lead Channel Sensing Intrinsic Amplitude: 3.3 mV
Lead Channel Setting Pacing Amplitude: 1 V
Lead Channel Setting Pacing Amplitude: 1.5 V
Lead Channel Setting Pacing Pulse Width: 0.4 ms
Lead Channel Setting Sensing Sensitivity: 2 mV
Pulse Gen Model: 2210
Pulse Gen Serial Number: 7457437

## 2022-08-16 LAB — MULTIPLE MYELOMA PANEL, SERUM
Albumin SerPl Elph-Mcnc: 3.6 g/dL (ref 2.9–4.4)
Albumin/Glob SerPl: 1.5 (ref 0.7–1.7)
Alpha 1: 0.2 g/dL (ref 0.0–0.4)
Alpha2 Glob SerPl Elph-Mcnc: 0.7 g/dL (ref 0.4–1.0)
B-Globulin SerPl Elph-Mcnc: 0.8 g/dL (ref 0.7–1.3)
Gamma Glob SerPl Elph-Mcnc: 0.7 g/dL (ref 0.4–1.8)
Globulin, Total: 2.5 g/dL (ref 2.2–3.9)
IgA: 5 mg/dL — ABNORMAL LOW (ref 87–352)
IgG (Immunoglobin G), Serum: 755 mg/dL (ref 586–1602)
IgM (Immunoglobulin M), Srm: 10 mg/dL — ABNORMAL LOW (ref 26–217)
M Protein SerPl Elph-Mcnc: 0.5 g/dL — ABNORMAL HIGH
Total Protein ELP: 6.1 g/dL (ref 6.0–8.5)

## 2022-08-20 ENCOUNTER — Other Ambulatory Visit: Payer: Self-pay

## 2022-08-20 ENCOUNTER — Other Ambulatory Visit: Payer: Self-pay | Admitting: Hematology

## 2022-08-20 ENCOUNTER — Inpatient Hospital Stay: Payer: Medicare Other

## 2022-08-20 ENCOUNTER — Telehealth: Payer: Self-pay

## 2022-08-20 VITALS — BP 127/75 | HR 60 | Temp 98.4°F | Resp 18 | Ht 64.0 in | Wt 183.8 lb

## 2022-08-20 DIAGNOSIS — C9000 Multiple myeloma not having achieved remission: Secondary | ICD-10-CM

## 2022-08-20 DIAGNOSIS — Z5112 Encounter for antineoplastic immunotherapy: Secondary | ICD-10-CM | POA: Diagnosis not present

## 2022-08-20 LAB — CBC WITH DIFFERENTIAL (CANCER CENTER ONLY)
Abs Immature Granulocytes: 0.01 10*3/uL (ref 0.00–0.07)
Basophils Absolute: 0 10*3/uL (ref 0.0–0.1)
Basophils Relative: 1 %
Eosinophils Absolute: 0.2 10*3/uL (ref 0.0–0.5)
Eosinophils Relative: 5 %
HCT: 37.5 % (ref 36.0–46.0)
Hemoglobin: 12.7 g/dL (ref 12.0–15.0)
Immature Granulocytes: 0 %
Lymphocytes Relative: 44 %
Lymphs Abs: 1.3 10*3/uL (ref 0.7–4.0)
MCH: 32.3 pg (ref 26.0–34.0)
MCHC: 33.9 g/dL (ref 30.0–36.0)
MCV: 95.4 fL (ref 80.0–100.0)
Monocytes Absolute: 0.2 10*3/uL (ref 0.1–1.0)
Monocytes Relative: 8 %
Neutro Abs: 1.3 10*3/uL — ABNORMAL LOW (ref 1.7–7.7)
Neutrophils Relative %: 42 %
Platelet Count: 171 10*3/uL (ref 150–400)
RBC: 3.93 MIL/uL (ref 3.87–5.11)
RDW: 14.7 % (ref 11.5–15.5)
WBC Count: 3 10*3/uL — ABNORMAL LOW (ref 4.0–10.5)
nRBC: 0 % (ref 0.0–0.2)

## 2022-08-20 LAB — CMP (CANCER CENTER ONLY)
ALT: 13 U/L (ref 0–44)
AST: 10 U/L — ABNORMAL LOW (ref 15–41)
Albumin: 4.2 g/dL (ref 3.5–5.0)
Alkaline Phosphatase: 78 U/L (ref 38–126)
Anion gap: 6 (ref 5–15)
BUN: 9 mg/dL (ref 8–23)
CO2: 29 mmol/L (ref 22–32)
Calcium: 9.2 mg/dL (ref 8.9–10.3)
Chloride: 107 mmol/L (ref 98–111)
Creatinine: 0.74 mg/dL (ref 0.44–1.00)
GFR, Estimated: >60 mL/min (ref >60)
Glucose, Bld: 111 mg/dL — ABNORMAL HIGH (ref 70–99)
Potassium: 4.1 mmol/L (ref 3.5–5.1)
Sodium: 142 mmol/L (ref 135–145)
Total Bilirubin: 0.9 mg/dL (ref 0.3–1.2)
Total Protein: 6.5 g/dL (ref 6.5–8.1)

## 2022-08-20 MED ORDER — BORTEZOMIB CHEMO SQ INJECTION 3.5 MG (2.5MG/ML)
1.3000 mg/m2 | Freq: Once | INTRAMUSCULAR | Status: AC
  Administered 2022-08-20: 2.5 mg via SUBCUTANEOUS
  Filled 2022-08-20: qty 1

## 2022-08-20 NOTE — Telephone Encounter (Signed)
Alyssa Dudley with [facility] called stating that they have a transplant date for Alyssa Dudley 04/20/1956 stated her tentative date is 09-26-22. Needed to know when the patient needed to stop her Revlimid before the transplant    Spoke with Dr. Burr Medico gave her the day of the cycle of Revlimid patient is in and when the transplant date is.   Spoke with patient told her the date of the transplant and found out what day in her Revlimid cycle she was in. She stated she was on her 9th day and will finish on Saturday.

## 2022-08-27 ENCOUNTER — Inpatient Hospital Stay: Payer: Medicare Other

## 2022-08-27 ENCOUNTER — Inpatient Hospital Stay (HOSPITAL_BASED_OUTPATIENT_CLINIC_OR_DEPARTMENT_OTHER): Payer: Medicare Other | Admitting: Adult Health

## 2022-08-27 ENCOUNTER — Encounter: Payer: Self-pay | Admitting: Adult Health

## 2022-08-27 VITALS — BP 176/95 | HR 69

## 2022-08-27 VITALS — BP 181/84 | HR 77 | Temp 97.7°F | Resp 18 | Ht 64.0 in | Wt 183.4 lb

## 2022-08-27 DIAGNOSIS — C9000 Multiple myeloma not having achieved remission: Secondary | ICD-10-CM

## 2022-08-27 DIAGNOSIS — Z5112 Encounter for antineoplastic immunotherapy: Secondary | ICD-10-CM | POA: Diagnosis not present

## 2022-08-27 DIAGNOSIS — D649 Anemia, unspecified: Secondary | ICD-10-CM

## 2022-08-27 LAB — CMP (CANCER CENTER ONLY)
ALT: 13 U/L (ref 0–44)
AST: 12 U/L — ABNORMAL LOW (ref 15–41)
Albumin: 4.2 g/dL (ref 3.5–5.0)
Alkaline Phosphatase: 77 U/L (ref 38–126)
Anion gap: 5 (ref 5–15)
BUN: 8 mg/dL (ref 8–23)
CO2: 29 mmol/L (ref 22–32)
Calcium: 9.2 mg/dL (ref 8.9–10.3)
Chloride: 109 mmol/L (ref 98–111)
Creatinine: 0.65 mg/dL (ref 0.44–1.00)
GFR, Estimated: >60 mL/min (ref >60)
Glucose, Bld: 87 mg/dL (ref 70–99)
Potassium: 3.9 mmol/L (ref 3.5–5.1)
Sodium: 143 mmol/L (ref 135–145)
Total Bilirubin: 0.9 mg/dL (ref 0.3–1.2)
Total Protein: 6.3 g/dL — ABNORMAL LOW (ref 6.5–8.1)

## 2022-08-27 LAB — CBC WITH DIFFERENTIAL (CANCER CENTER ONLY)
Abs Immature Granulocytes: 0 10*3/uL (ref 0.00–0.07)
Basophils Absolute: 0 10*3/uL (ref 0.0–0.1)
Basophils Relative: 1 %
Eosinophils Absolute: 0.1 10*3/uL (ref 0.0–0.5)
Eosinophils Relative: 3 %
HCT: 34.6 % — ABNORMAL LOW (ref 36.0–46.0)
Hemoglobin: 11.6 g/dL — ABNORMAL LOW (ref 12.0–15.0)
Immature Granulocytes: 0 %
Lymphocytes Relative: 44 %
Lymphs Abs: 1.6 10*3/uL (ref 0.7–4.0)
MCH: 31.6 pg (ref 26.0–34.0)
MCHC: 33.5 g/dL (ref 30.0–36.0)
MCV: 94.3 fL (ref 80.0–100.0)
Monocytes Absolute: 0.7 10*3/uL (ref 0.1–1.0)
Monocytes Relative: 19 %
Neutro Abs: 1.1 10*3/uL — ABNORMAL LOW (ref 1.7–7.7)
Neutrophils Relative %: 33 %
Platelet Count: 164 10*3/uL (ref 150–400)
RBC: 3.67 MIL/uL — ABNORMAL LOW (ref 3.87–5.11)
RDW: 14.7 % (ref 11.5–15.5)
WBC Count: 3.4 10*3/uL — ABNORMAL LOW (ref 4.0–10.5)
nRBC: 0 % (ref 0.0–0.2)

## 2022-08-27 MED ORDER — BORTEZOMIB CHEMO SQ INJECTION 3.5 MG (2.5MG/ML)
1.3000 mg/m2 | Freq: Once | INTRAMUSCULAR | Status: AC
  Administered 2022-08-27: 2.5 mg via SUBCUTANEOUS
  Filled 2022-08-27: qty 1

## 2022-08-27 NOTE — Progress Notes (Signed)
Patient states she took her pre meds at home at 830 this morning

## 2022-08-27 NOTE — Patient Instructions (Signed)
[facility] ONCOLOGY  Discharge Instructions: Thank you for choosing [facility] to provide your oncology and hematology care.   If you have a lab appointment with the [facility], please go directly to the [facility] and check in at the registration area.   Wear comfortable clothing and clothing appropriate for easy access to any Portacath or PICC line.   We strive to give you quality time with your provider. You may need to reschedule your appointment if you arrive late (15 or more minutes).  Arriving late affects you and other patients whose appointments are after yours.  Also, if you miss three or more appointments without notifying the office, you may be dismissed from the clinic at the provider's discretion.      For prescription refill requests, have your pharmacy contact our office and allow 72 hours for refills to be completed.    Today you received the following chemotherapy and/or immunotherapy agents: bortezomib      To help prevent nausea and vomiting after your treatment, we encourage you to take your nausea medication as directed.  BELOW ARE SYMPTOMS THAT SHOULD BE REPORTED IMMEDIATELY: *FEVER GREATER THAN 100.4 F (38 C) OR HIGHER *CHILLS OR SWEATING *NAUSEA AND VOMITING THAT IS NOT CONTROLLED WITH YOUR NAUSEA MEDICATION *UNUSUAL SHORTNESS OF BREATH *UNUSUAL BRUISING OR BLEEDING *URINARY PROBLEMS (pain or burning when urinating, or frequent urination) *BOWEL PROBLEMS (unusual diarrhea, constipation, pain near the anus) TENDERNESS IN MOUTH AND THROAT WITH OR WITHOUT PRESENCE OF ULCERS (sore throat, sores in mouth, or a toothache) UNUSUAL RASH, SWELLING OR PAIN  UNUSUAL VAGINAL DISCHARGE OR ITCHING   Items with * indicate a potential emergency and should be followed up as soon as possible or go to the Emergency Department if any problems should occur.  Please show the CHEMOTHERAPY ALERT CARD or IMMUNOTHERAPY ALERT CARD at check-in to  the Emergency Department and triage nurse.  Should you have questions after your visit or need to cancel or reschedule your appointment, please contact [facility]  Dept: [phone]  and follow the prompts.  Office hours are 8:00 a.m. to 4:30 p.m. Monday - Friday. Please note that voicemails left after 4:00 p.m. may not be returned until the following business day.  We are closed weekends and major holidays. You have access to a nurse at all times for urgent questions. Please call the main number to the clinic Dept: [phone] and follow the prompts.   For any non-urgent questions, you may also contact your provider using MyChart. We now offer e-Visits for anyone 13 and older to request care online for non-urgent symptoms. For details visit [email or web address].   Also download the MyChart app! Go to the app store, search "MyChart", open the app, select [facility], and log in with your MyChart username and password.  Masks are optional in the cancer centers. If you would like for your care team to wear a mask while they are taking care of you, please let them know. You may have one support Tareek Sabo who is at least 65 years old accompany you for your appointments.

## 2022-08-27 NOTE — Assessment & Plan Note (Signed)
Alyssa Dudley is a 66 year old woman with R-ISS stage II multiple myeloma diagnosed in July 2023 status post treatment with Velcade, daratumumab, and Revlimid.  She will proceed with treatment today with Velcade alone.  I spoke with Dr. Morey Hummingbird who confirms that she does not need to restart her Revlimid.  Tentative transplant date is on September 26, 2022 she will have a treatment vacation during this time.    Alyssa Dudley will return on December 4 for labs and follow-up with Dr. Morey Hummingbird to discuss the upcoming autologous transplant and follow-up plans with our cancer center afterwards.

## 2022-08-27 NOTE — Progress Notes (Unsigned)
[facility] Follow up:    Alyssa Pinto, MD [address] [place] [address]   DIAGNOSIS:  Cancer Staging  Multiple myeloma [facility]) Staging form: Plasma Cell Myeloma and Plasma Cell Disorders, AJCC 8th Edition - Clinical stage from 05/01/2022: RISS Stage II (Beta-2-microglobulin (mg/L): 3.2, Albumin (g/dL): 3.6, ISS: Stage I, High-risk cytogenetics: Absent, LDH: Elevated) - Signed by [name], MD on 06/03/2022 Stage prefix: Initial diagnosis Beta 2 microglobulin range (mg/L): Less than 3.5 Albumin range (g/dL): Greater than or equal to 3.5 Cytogenetics: 1p deletion   SUMMARY OF ONCOLOGIC HISTORY: Oncology History  Multiple myeloma ([place])  05/01/2022 Cancer Staging   Staging form: Plasma Cell Myeloma and Plasma Cell Disorders, AJCC 8th Edition - Clinical stage from 05/01/2022: RISS Stage II (Beta-2-microglobulin (mg/L): 3.2, Albumin (g/dL): 3.6, ISS: Stage I, High-risk cytogenetics: Absent, LDH: Elevated) - Signed by [name], MD on 06/03/2022 Stage prefix: Initial diagnosis Beta 2 microglobulin range (mg/L): Less than 3.5 Albumin range (g/dL): Greater than or equal to 3.5 Cytogenetics: 1p deletion   05/08/2022 Initial Diagnosis   Multiple myeloma ([place])   05/14/2022 -  Chemotherapy   Patient is on Treatment Plan : [place] (Daratumumab IV) q21d x 6 Cycles (Induction/Consolidation)     05/15/2022 - 05/28/2022 Chemotherapy   Patient is on Treatment Plan : MYELOMA NEWLY DIAGNOSED TRANSPLANT CANDIDATE DaraVRd (Daratumumab IV) q21d x 6 Cycles (Induction/Consolidation)       CURRENT THERAPY: Revlimid, Velcade, and daratumumab  INTERVAL HISTORY: Alyssa Dudley 66 y.o. female returns for follow-up of her multiple myeloma.  She receives daratumumab given on day 1 of a 28-day cycle, Velcade given on days 1, 8, 15 and 22 of a 28-day cycle and Revlimid 20 mg daily 14 days on and 7 days off on a 21-day  cycle.  Alyssa Dudley is tolerating the above treatment well and has no concerns today.  She does want to know what the plan is for her Revlimid as she underwent work-up and evaluation with the bone marrow transplant program at [facility] yesterday.   Patient Active Problem List   Diagnosis Date Noted   Multiple myeloma ([place]) 05/08/2022   History of renal calculi 05/17/2021   Osteoarthritis of multiple joints 04/05/2021   Salivary stone 04/05/2021   Meralgia paraesthetica, left 02/13/2018   Plantar fasciitis of left foot 02/13/2018   Lateral epicondylitis of left elbow 02/13/2018   Overweight (BMI 25.0-29.9) 02/25/2015   HTN (hypertension) 08/17/2014   Abnormal glucose 08/17/2014   Medication management 08/17/2014   Atrial fibrillation ([place]) 06/07/2014   Hyperlipidemia    Vitamin D deficiency    Mobitz type 2 second degree and high-grade heart block 01/07/2013   Family history of malignant neoplasm of gastrointestinal tract 10/15/2011   Benign neoplasm of colon 10/15/2011   GERD 01/10/2008   Constipation 01/10/2008   History of peptic ulcer disease 01/10/2008    has No Known Allergies.  MEDICAL HISTORY: Past Medical History:  Diagnosis Date   Allergy    once a year    Hyperlipidemia    borderline- no meds    Pacemaker    Paroxysmal atrial fibrillation ([place]) 6/15   detected on ppm interrogation   PONV (postoperative nausea and vomiting)    has vomitted in past   Second degree Mobitz II AV block    Vitamin D deficiency     SURGICAL HISTORY: Past Surgical History:  Procedure Laterality Date   [place]  COLONOSCOPY     EXTRACORPOREAL SHOCK WAVE LITHOTRIPSY Right 12/28/2019   Procedure: RIGHT EXTRACORPOREAL SHOCK WAVE LITHOTRIPSY (ESWL);  Surgeon: Raynelle Bring, MD;  Location: [facility];  Service: Urology;  Laterality: Right;   PERMANENT PACEMAKER INSERTION N/A 01/13/2013   SJM Accent DR RF implanted by Dr [name] for mobitz II AV block    PLANTAR FASCIECTOMY     POLYPECTOMY     WRIST SURGERY Right 2011   removed cyst    SOCIAL HISTORY: Social History   Socioeconomic History   Marital status: Single    Spouse name: Not on file   Number of children: 1   Years of education: Not on file   Highest education level: Not on file  Occupational History   Not on file  Tobacco Use   Smoking status: Never   Smokeless tobacco: Never  Vaping Use   Vaping Use: Never used  Substance and Sexual Activity   Alcohol use: No   Drug use: No   Sexual activity: Not Currently  Other Topics Concern   Not on file  Social History Narrative   Not on file   Social Determinants of Health   Financial Resource Strain: Not on file  Food Insecurity: Not on file  Transportation Needs: Not on file  Physical Activity: Sufficiently Active (03/04/2018)   Exercise Vital Sign    Days of Exercise per Week: 4 days    Minutes of Exercise per Session: 40 min  Stress: Stress Concern Present (03/04/2018)   [place]    Feeling of Stress : To some extent  Social Connections: Not on file  Intimate Partner Violence: Not on file    FAMILY HISTORY: Family History  Problem Relation Age of Onset   Cervical cancer Mother    Hypertension Mother    Diabetes Mother    Heart disease Father    Hyperlipidemia Father    Colon cancer Sister 5   Anemia Sister    Leukemia Sister    Prostate cancer Brother 81   HIV/AIDS Brother    Multiple myeloma Son 78   Hypertension Son    Kidney disease Son    Multiple myeloma Maternal Aunt    Colon polyps Neg Hx    Rectal cancer Neg Hx    Stomach cancer Neg Hx    Liver disease Neg Hx    Esophageal cancer Neg Hx     Review of Systems  Constitutional:  Positive for fatigue (mild fatigue). Negative for appetite change, chills, fever and unexpected weight change.  HENT:   Negative for hearing loss, lump/mass and trouble swallowing.   Eyes:   Negative for eye problems and icterus.  Respiratory:  Negative for chest tightness, cough and shortness of breath.   Cardiovascular:  Negative for chest pain, leg swelling and palpitations.  Gastrointestinal:  Negative for abdominal distention, abdominal pain, constipation, diarrhea, nausea and vomiting.  Endocrine: Negative for hot flashes.  Genitourinary:  Negative for difficulty urinating.   Musculoskeletal:  Negative for arthralgias.  Skin:  Negative for itching and rash.  Neurological:  Negative for dizziness, extremity weakness, headaches and numbness.  Hematological:  Negative for adenopathy. Does not bruise/bleed easily.  Psychiatric/Behavioral:  Negative for depression. The patient is not nervous/anxious.       PHYSICAL EXAMINATION  ECOG PERFORMANCE STATUS: 0 - Asymptomatic  Vitals:   08/27/22 0905  BP: (!) 181/84  Pulse: 77  Resp: 18  Temp: 97.7 F (36.5  C)  SpO2: 100%    Physical Exam Constitutional:      General: She is not in acute distress.    Appearance: Normal appearance. She is not toxic-appearing.  HENT:     Head: Normocephalic and atraumatic.  Eyes:     General: No scleral icterus. Cardiovascular:     Rate and Rhythm: Normal rate and regular rhythm.     Pulses: Normal pulses.     Heart sounds: Normal heart sounds.  Pulmonary:     Effort: Pulmonary effort is normal.     Breath sounds: Normal breath sounds.  Abdominal:     General: Abdomen is flat. Bowel sounds are normal. There is no distension.     Palpations: Abdomen is soft.     Tenderness: There is no abdominal tenderness.  Musculoskeletal:        General: No swelling.     Cervical back: Neck supple.  Lymphadenopathy:     Cervical: No cervical adenopathy.  Skin:    General: Skin is warm and dry.     Findings: No rash.  Neurological:     General: No focal deficit present.     Mental Status: She is alert.  Psychiatric:        Mood and Affect: Mood normal.        Behavior: Behavior  normal.     LABORATORY DATA:  CBC    Component Value Date/Time   WBC 3.4 (L) 08/27/2022 0842   WBC 2.4 (L) 05/22/2022 0832   RBC 3.67 (L) 08/27/2022 0842   HGB 11.6 (L) 08/27/2022 0842   HCT 34.6 (L) 08/27/2022 0842   PLT 164 08/27/2022 0842   MCV 94.3 08/27/2022 0842   MCH 31.6 08/27/2022 0842   MCHC 33.5 08/27/2022 0842   RDW 14.7 08/27/2022 0842   LYMPHSABS 1.6 08/27/2022 0842   MONOABS 0.7 08/27/2022 0842   EOSABS 0.1 08/27/2022 0842   BASOSABS 0.0 08/27/2022 0842    CMP     Component Value Date/Time   NA 143 08/27/2022 0842   K 3.9 08/27/2022 0842   CL 109 08/27/2022 0842   CO2 29 08/27/2022 0842   GLUCOSE 87 08/27/2022 0842   BUN 8 08/27/2022 0842   CREATININE 0.65 08/27/2022 0842   CREATININE 0.77 04/05/2022 0946   CALCIUM 9.2 08/27/2022 0842   PROT 6.3 (L) 08/27/2022 0842   ALBUMIN 4.2 08/27/2022 0842   AST 12 (L) 08/27/2022 0842   ALT 13 08/27/2022 0842   ALKPHOS 77 08/27/2022 0842   BILITOT 0.9 08/27/2022 0842   GFRNONAA >60 08/27/2022 0842   GFRNONAA 94 04/05/2021 1025   GFRAA 108 04/05/2021 1025      ASSESSMENT and THERAPY PLAN:   Multiple myeloma ([place]) Alyssa Dudley is a 66 year old woman with R-ISS stage II multiple myeloma diagnosed in July 2023 status post treatment with Velcade, daratumumab, and Revlimid.  She will proceed with treatment today with Velcade alone.  I spoke with Dr. Burr Medico who confirms that today she will finish her initial treatment.  Her tentative autologous transplant date is 09/26/2022.  She will not receive any further appointments or treatment prior to this, therefore I have requested cancellation of all her appointments.    We are happy to see her in the interim if she has any needs or concerns.     All questions were answered. The patient knows to call the clinic with any problems, questions or concerns. We can certainly see the patient much sooner if necessary.  Total encounter  time:20 minutes*in face-to-face visit time,  chart review, lab review, care coordination, order entry, and documentation of the encounter time.    Wilber Bihari, NP 08/28/22 8:35 AM Medical Oncology and Hematology [facility] [place], [place] [address] Tel. [phone]    Fax. [phone]  *Total Encounter Time as defined by the Centers for Medicare and Medicaid Services includes, in addition to the face-to-face time of a patient visit (documented in the note above) non-face-to-face time: obtaining and reviewing outside history, ordering and reviewing medications, tests or procedures, care coordination (communications with other health care professionals or caregivers) and documentation in the medical record.

## 2022-08-28 ENCOUNTER — Ambulatory Visit: Payer: Medicare Other

## 2022-08-28 ENCOUNTER — Other Ambulatory Visit: Payer: Medicare Other

## 2022-08-28 ENCOUNTER — Encounter: Payer: Self-pay | Admitting: Hematology

## 2022-08-28 LAB — KAPPA/LAMBDA LIGHT CHAINS
Kappa free light chain: 5 mg/L (ref 3.3–19.4)
Kappa, lambda light chain ratio: 0.01 — ABNORMAL LOW (ref 0.26–1.65)
Lambda free light chains: 396.8 mg/L — ABNORMAL HIGH (ref 5.7–26.3)

## 2022-09-03 LAB — MULTIPLE MYELOMA PANEL, SERUM
Albumin SerPl Elph-Mcnc: 3.6 g/dL (ref 2.9–4.4)
Albumin/Glob SerPl: 1.7 (ref 0.7–1.7)
Alpha 1: 0.2 g/dL (ref 0.0–0.4)
Alpha2 Glob SerPl Elph-Mcnc: 0.6 g/dL (ref 0.4–1.0)
B-Globulin SerPl Elph-Mcnc: 0.8 g/dL (ref 0.7–1.3)
Gamma Glob SerPl Elph-Mcnc: 0.6 g/dL (ref 0.4–1.8)
Globulin, Total: 2.2 g/dL (ref 2.2–3.9)
IgA: <5 mg/dL — ABNORMAL LOW (ref 87–352)
IgG (Immunoglobin G), Serum: 593 mg/dL (ref 586–1602)
IgM (Immunoglobulin M), Srm: 10 mg/dL — ABNORMAL LOW (ref 26–217)
M Protein SerPl Elph-Mcnc: 0.4 g/dL — ABNORMAL HIGH
Total Protein ELP: 5.8 g/dL — ABNORMAL LOW (ref 6.0–8.5)

## 2022-09-04 ENCOUNTER — Other Ambulatory Visit: Payer: Medicare Other

## 2022-09-04 ENCOUNTER — Ambulatory Visit: Payer: Medicare Other

## 2022-09-10 ENCOUNTER — Ambulatory Visit: Payer: Medicare Other

## 2022-09-10 ENCOUNTER — Other Ambulatory Visit: Payer: Medicare Other

## 2022-09-10 ENCOUNTER — Ambulatory Visit: Payer: Medicare Other | Admitting: Hematology

## 2022-09-13 NOTE — Progress Notes (Signed)
Remote pacemaker transmission.   

## 2022-09-17 ENCOUNTER — Other Ambulatory Visit: Payer: Medicare Other

## 2022-09-17 ENCOUNTER — Ambulatory Visit: Payer: Medicare Other

## 2022-09-17 NOTE — Progress Notes (Deleted)
FOLLOW UP 6 MONTH   Assessment and Plan:  Essential hypertension Continue medication: Atenolol 9m  Monitor blood pressure at home; call if consistently over 130/80 Continue DASH diet.   Reminder to go to the ER if any CP, SOB, nausea, dizziness, severe HA, changes vision/speech, left arm numbness and tingling and jaw pain.  Paroxysmal atrial fibrillation (HCC) CHADSASC is now 2 since she is being treated for HTN- not on anticoag Followed by cardiology- may want to discuss- will likely need with turning 66 at least  Mobitz type 2 second degree and high-grade heart block Pacemaker in place, continue follow up with Dr. ARayann Heman Gastroesophageal reflux disease, esophagitis presence not specified Well managed on current medications; PRN OTC agents Discussed diet, avoiding triggers and other lifestyle changes  Benign neoplasm of colon, unspecified part of colon UTD  Vitamin D deficiency Continue supplementation for goal of 70-100 Check vitamin D level  Overweight (BMI 25.0-29.9) Long discussion about weight loss, diet, and exercise Recommended diet heavy in fruits and veggies and low in animal meats, cheeses, and dairy products, appropriate calorie intake Patient will work on cutting down on sugary snacks Follow up at next visit  Medication management Continued  Pure hypercholesterolemia Currently at goal by lifestyle modification Continue low cholesterol diet and exercise.  Check lipid panel.   History of peptic ulcer disease Avoid NSAIDS daily  Abnormal glucose Discussed diet/exercise, weight management  A1C defer today  Anemia, unspecified type No supplementation Defer check today, asymptomatic    Further disposition pending results if labs check today. Discussed med's effects and SE's.   Over 30 minutes of face to face interview, exam, counseling, chart review, and critical decision making was performed.    Future Appointments  Date Time Provider D[place] 09/19/2022 10:30 AM WAlycia Rossetti NP [facility] None  11/12/2022  7:05 AM [facility] DEVICE REMOTES [facility]  02/11/2023  7:05 AM [facility] DEVICE REMOTES [facility]  04/17/2023  9:00 AM WAlycia Rossetti NP [facility] None  05/13/2023  7:05 AM [facility] DEVICE REMOTES [facility]     HPI  This very nice 66y.o.female presents for follow up 3 month and has GERD; Constipation; History of peptic ulcer disease; Family history of malignant neoplasm of gastrointestinal tract; Benign neoplasm of colon; Mobitz type 2 second degree and high-grade heart block; Hyperlipidemia; Vitamin D deficiency; [place]rial fibrillation ([place]; HTN (hypertension); Abnormal glucose; Medication management; Overweight (BMI 25.0-29.9); Meralgia paraesthetica, left; Plantar fasciitis of left foot; Lateral epicondylitis of left elbow; Osteoarthritis of multiple joints; Salivary stone; History of renal calculi; and [place].   She reports today she is doing well.  She does not have any health or medication concerns.  She has Afib and mobitz type 2 with pacemaker placement in 2014, follows with Dr. ARayann Hemanand MLeonides Schanz low risk CHADSVASC 2 and not on anticoagulation. Patient reports no complaints at this time.   She had left and right shoulder Xray 2013 showed DJD. She received an injection for this and it has been doing well since.    BMI is There is no height or weight on file to calculate BMI., she has been working on diet and exercise.  Wt Readings from Last 3 Encounters:  08/27/22 183 lb 6.4 oz (83.2 kg)  08/20/22 183 lb 12.8 oz (83.4 kg)  08/13/22 190 lb 3.2 oz (86.3 kg)   Her blood pressure has been controlled at home, today their BP is    She  does workout. She denies chest pain, shortness of breath, dizziness.   She is not on cholesterol medication, she tried zetia but states she had diarrhea with that, taking 3 x a week. . Her  cholesterol is not at goal. The cholesterol last visit was:   Lab Results  Component Value Date   CHOL 134 04/05/2022   HDL 35 (L) 04/05/2022   LDLCALC 85 04/05/2022   TRIG 49 04/05/2022   CHOLHDL 3.8 04/05/2022    She has been working on diet and exercise for glucose management, and denies foot ulcerations, increased appetite, nausea, paresthesia of the feet, polydipsia, polyuria, visual disturbances and vomiting. Last A1C in the office was:  Lab Results  Component Value Date   HGBA1C 5.6 04/05/2022   Patient is on Vitamin D supplement, 1 capsule twice a week Lab Results  Component Value Date   VD25OH >150 (H) 04/05/2022       Current Medications:  Current Outpatient Medications on File Prior to Visit  Medication Sig Dispense Refill   acyclovir (ZOVIRAX) 400 MG tablet Take 1 tablet (400 mg total) by mouth 2 (two) times daily. 60 tablet 2   aspirin 81 MG chewable tablet Chew by mouth daily.     atenolol (TENORMIN) 50 MG tablet Take  1 tablet  Daily  for BP 90 tablet 3   chlorhexidine (PERIDEX) 0.12 % solution 10 mLs as needed.     dexamethasone (DECADRON) 4 MG tablet Take 5 tablets (20 mg total) by mouth once a week. 1-2 hours before chemo injections weekly 30 tablet 1   lenalidomide (REVLIMID) 20 MG capsule Take 1 capsule (20 mg total) by mouth daily. Take for 14 days on, 7 days off. Repeat every 21 days. [employer] Auth # [id number]; Date Obtained 08/06/22 14 capsule 0   ondansetron (ZOFRAN) 8 MG tablet Take 8 mg by mouth 3 (three) times daily.     triamcinolone cream (KENALOG) 0.1 % APPLY EXTERNALLY TO THE AFFECTED AREA THREE TIMES DAILY AS NEEDED FOR RASH 30 g 2   Vitamin D, Ergocalciferol, (DRISDOL) 1.25 MG (50000 UNIT) CAPS capsule TAKE 1 CAPSULE BY MOUTH TWICE WEEKLY 180 capsule 0   Current Facility-Administered Medications on File Prior to Visit  Medication Dose Route Frequency Provider Last Rate Last Admin   0.9 %  sodium chloride infusion  500 mL Intravenous Once Pyrtle, Lajuan Lines, MD       Health Maintenance:   Immunization History  Administered Date(s) Administered   Influenza Inj Mdck Quad With Preservative 07/26/2017, 08/11/2018, 07/13/2019, 07/25/2020   Influenza Split 06/30/2013, 07/20/2014, 07/21/2015   Influenza, High Dose Seasonal PF 07/05/2022   Influenza,inj,Quad PF,6+ Mos 08/04/2021   Influenza,inj,quad, With Preservative 07/05/2016   PFIZER(Purple Top)SARS-COV-2 Vaccination 12/05/2019, 12/26/2019, 07/23/2020   PPD Test 02/22/2014, 05/10/2015   Tdap 10/09/2011   Zoster Recombinat (Shingrix) 01/09/2017, 02/06/2018, 02/06/2018   Colonoscopy:05/2018 Mammo: 08/2019  BMD: 2015 WNL will wait until closer to 66 Pap/ Pelvic: 2018 WNL - HPV neg DUE AB [place] 2011 CXR 2014 Echo 01/2013  EYE: Dr. Peter Garter, last 2018, wears glasses Dentist: Dr. Glynn Octave, Q 21month  Allergies: No Known Allergies Medical History:  has GERD; Constipation; History of peptic ulcer disease; Family history of malignant neoplasm of gastrointestinal tract; Benign neoplasm of colon; Mobitz type 2 second degree and high-grade heart block; Hyperlipidemia; Vitamin D deficiency; Atrial fibrillation ([place]; HTN (hypertension); Abnormal glucose; Medication management; Overweight (BMI 25.0-29.9); Meralgia paraesthetica, left; Plantar fasciitis of left foot; Lateral epicondylitis of left elbow;  Osteoarthritis of multiple joints; Salivary stone; History of renal calculi; and Multiple myeloma (HCC) on their problem list. Surgical History:  She  has a past surgical history that includes Breast cyst excision (1980); Wrist surgery (Right, 2011); permanent pacemaker insertion (N/A, 01/13/2013); Colonoscopy; Polypectomy; Plantar fasciectomy; and Extracorporeal shock wave lithotripsy (Right, 12/28/2019). Family History:  Her family history includes Anemia in her sister; Cervical cancer in her mother; Colon cancer (age of onset: 47) in her sister; Diabetes in her mother; HIV/AIDS in her brother; Heart disease in  her father; Hyperlipidemia in her father; Hypertension in her mother and son; Kidney disease in her son; Leukemia in her sister; Multiple myeloma in her maternal aunt; Multiple myeloma (age of onset: 42) in her son; Prostate cancer (age of onset: 46) in her brother. Social History:   reports that she has never smoked. She has never used smokeless tobacco. She reports that she does not drink alcohol and does not use drugs.  Review of Systems: Review of Systems  Constitutional:  Negative for chills, fever, malaise/fatigue and weight loss.  HENT:  Negative for ear discharge, ear pain, hearing loss, nosebleeds and tinnitus.   Eyes:  Negative for blurred vision, double vision, photophobia, pain and discharge.  Respiratory:  Negative for cough, hemoptysis, sputum production and shortness of breath.   Cardiovascular:  Negative for chest pain, palpitations, orthopnea and claudication.  Gastrointestinal:  Negative for abdominal pain, constipation, diarrhea, heartburn, nausea and vomiting.  Genitourinary:  Negative for dysuria, flank pain, frequency, hematuria and urgency.  Musculoskeletal:  Positive for joint pain. Negative for back pain, falls, myalgias and neck pain.       Hands, intermittent  Skin:  Negative for itching and rash.  Neurological:  Negative for dizziness, tingling, tremors, sensory change, speech change, focal weakness, weakness and headaches.  Endo/Heme/Allergies:  Negative for environmental allergies and polydipsia. Does not bruise/bleed easily.  Psychiatric/Behavioral:  Negative for depression, hallucinations, substance abuse and suicidal ideas. The patient is not nervous/anxious.     Physical Exam: Estimated body mass index is 31.48 kg/m as calculated from the following:   Height as of 08/27/22: _0  (1.626 m).   Weight as of 08/27/22: 183 lb 6.4 oz (83.2 kg). There were no vitals taken for this visit. General Appearance: Well nourished, in no apparent distress.  Eyes:  PERRLA, EOMs, conjunctiva no swelling or erythema, normal fundi and vessels.  Sinuses: No Frontal/maxillary tenderness  ENT/Mouth: Ext aud canals clear, normal light reflex with TMs without erythema, bulging. Good dentition. No erythema, swelling, or exudate on post pharynx. Tonsils not swollen or erythematous. Hearing normal.  Neck: Supple, thyroid normal. No bruits  Respiratory: Respiratory effort normal, BS equal bilaterally without rales, rhonchi, wheezing or stridor.  Cardio: RRR without murmurs, rubs or gallops. Brisk peripheral pulses without edema.  Chest: symmetric, with normal excursions and percussion.  Breasts: Symmetric, without lumps, nipple discharge, retractions.  Abdomen: Soft, nontender, no guarding, rebound, hernias, masses, or organomegaly.  Lymphatics: Non tender without lymphadenopathy.  Musculoskeletal: Full ROM all peripheral extremities,5/5 strength, and normal gait, neg straight leg. Full ROM left hip. + pin point left SI tenderness to palpation.  Skin: Warm, dry without rashes, lesions, ecchymosis. Neuro: Cranial nerves intact, reflexes equal bilaterally. Normal muscle tone, no cerebellar symptoms. Sensation intact.  Psych: Awake and oriented X 3, normal affect, Insight and Judgment appropriate.     Garnet Sierras, Laqueta Jean, DNP [facility] 09/17/2022  12:43 PM

## 2022-09-19 ENCOUNTER — Ambulatory Visit: Payer: Medicare Other | Admitting: Nurse Practitioner

## 2022-09-19 DIAGNOSIS — R7309 Other abnormal glucose: Secondary | ICD-10-CM

## 2022-09-19 DIAGNOSIS — K219 Gastro-esophageal reflux disease without esophagitis: Secondary | ICD-10-CM

## 2022-09-19 DIAGNOSIS — E559 Vitamin D deficiency, unspecified: Secondary | ICD-10-CM

## 2022-09-19 DIAGNOSIS — D126 Benign neoplasm of colon, unspecified: Secondary | ICD-10-CM

## 2022-09-19 DIAGNOSIS — Z79899 Other long term (current) drug therapy: Secondary | ICD-10-CM

## 2022-09-19 DIAGNOSIS — C9000 Multiple myeloma not having achieved remission: Secondary | ICD-10-CM

## 2022-09-19 DIAGNOSIS — Z8711 Personal history of peptic ulcer disease: Secondary | ICD-10-CM

## 2022-09-19 DIAGNOSIS — E7800 Pure hypercholesterolemia, unspecified: Secondary | ICD-10-CM

## 2022-09-19 DIAGNOSIS — E663 Overweight: Secondary | ICD-10-CM

## 2022-09-19 DIAGNOSIS — I10 Essential (primary) hypertension: Secondary | ICD-10-CM

## 2022-09-19 DIAGNOSIS — I441 Atrioventricular block, second degree: Secondary | ICD-10-CM

## 2022-09-19 DIAGNOSIS — I480 Paroxysmal atrial fibrillation: Secondary | ICD-10-CM

## 2022-09-20 ENCOUNTER — Encounter: Payer: Self-pay | Admitting: Internal Medicine

## 2022-09-24 ENCOUNTER — Ambulatory Visit: Payer: Medicare Other

## 2022-09-24 ENCOUNTER — Other Ambulatory Visit: Payer: Medicare Other

## 2022-09-24 ENCOUNTER — Ambulatory Visit: Payer: Medicare Other | Admitting: Hematology

## 2022-09-25 NOTE — Progress Notes (Unsigned)
FOLLOW UP 6 MONTH   Assessment and Plan:  Multiple Myeloma not having achieved remission(HCC) Continue to follow with oncology Has upcoming stem cell transplant 10/11/22  Essential hypertension Continue medication: Atenolol 56m  Monitor blood pressure at home; call if consistently over 130/80 Continue DASH diet.   Reminder to go to the ER if any CP, SOB, nausea, dizziness, severe HA, changes vision/speech, left arm numbness and tingling and jaw pain.  Paroxysmal atrial fibrillation (HCC) CHADSASC is now 2 since she is being treated for HTN- not on anticoag Followed by cardiology- may want to discuss- will likely need with turning 66 at least  Mobitz type 2 second degree and high-grade heart block Pacemaker in place, continue follow up with Dr. ARayann Heman Gastroesophageal reflux disease, esophagitis presence not specified Well managed on current medications; PRN OTC agents Discussed diet, avoiding triggers and other lifestyle changes  Benign neoplasm of colon, unspecified part of colon UTD  Vitamin D deficiency Continue supplementation for goal of 70-100 Check vitamin D level  Overweight (BMI 25.0-29.9) Long discussion about weight loss, diet, and exercise Recommended diet heavy in fruits and veggies and low in animal meats, cheeses, and dairy products, appropriate calorie intake Patient will work on cutting down on sugary snacks Follow up at next visit  Medication management Continued  Pure hypercholesterolemia Currently at goal by lifestyle modification Continue low cholesterol diet and exercise.  Check lipid panel.   History of peptic ulcer disease Avoid NSAIDS daily  Abnormal glucose Discussed diet/exercise, weight management  A1C defer today  Anemia, unspecified type No supplementation Defer check today, asymptomatic    Further disposition pending results if labs check today. Discussed med's effects and SE's.   Over 30 minutes of face to face interview,  exam, counseling, chart review, and critical decision making was performed.    Future Appointments  Date Time Provider [place] 11/12/2022  7:05 AM [facility]ES [facility]  02/11/2023  7:05 AM [facility] DEVICE REMOTES [facility]  04/17/2023  9:00 AM WAlycia Rossetti NP [facility] None  05/13/2023  7:05 AM [facility] DEVICE [facility]OTES [facility]     HPI  This very nice 66y.o.female presents for follow up 3 month and has GERD; Constipation; History of peptic ulcer disease; Family history of malignant neoplasm of gastrointestinal tract; Benign neoplasm of colon; Mobitz type 2 second degree and high-grade heart block; Hyperlipidemia; Vitamin D deficiency; Atrial fibrillation ([place]; HTN (hypertension); Abnormal glucose; Medication management; Overweight (BMI 25.0-29.9); Meralgia paraesthetica, left; Plantar fasciitis of left foot; Lateral epicondylitis of left elbow; Osteoarthritis of multiple joints; Salivary stone; History of renal calculi; and Multiple myeloma (HCC) on their problem list.   She was diagnosed with multiple myeloma earlier in the year. She is being followed by Dr. LAris Lot She has had all her stem cells collected and is to have the transplant 10/11/22.   She has Afib and mobitz type 2 with pacemaker placement in 2014, follows with Dr. ARayann Hemanand MLeonides Schanz low risk CHADSVASC 2 and not on anticoagulation. Patient reports no complaints at this time.   She had left and right shoulder Xray 2013 showed DJD. She received an injection for this and it has been doing well since.    BMI is Body mass index is 32.51 kg/m., she has been working on diet and exercise.  Wt Readings from Last 3 Encounters:  09/26/22 189 lb 6.4 oz (85.9 kg)  08/27/22 183 lb 6.4 oz (83.2 kg)  08/20/22 183 lb 12.8 oz (83.4  kg)   Her blood pressure has been controlled at home, today their BP is BP: 128/88  BP Readings from Last 3 Encounters:  09/26/22  128/88  08/27/22 (!) 176/95  08/27/22 (!) 181/84     She does workout. She denies chest pain, shortness of breath, dizziness.   She is not on cholesterol medication, she tried zetia but states she had diarrhea with that, taking 3 x a week. . Her cholesterol is not at goal. The cholesterol last visit was:   Lab Results  Component Value Date   CHOL 134 04/05/2022   HDL 35 (L) 04/05/2022   LDLCALC 85 04/05/2022   TRIG 49 04/05/2022   CHOLHDL 3.8 04/05/2022    She has been working on diet and exercise for glucose management, and denies foot ulcerations, increased appetite, nausea, paresthesia of the feet, polydipsia, polyuria, visual disturbances and vomiting. Last A1C in the office was:  Lab Results  Component Value Date   HGBA1C 5.6 04/05/2022   Patient is on Vitamin D supplement, 1 capsule twice a week Lab Results  Component Value Date   VD25OH >150 (H) 04/05/2022       Current Medications:  Current Outpatient Medications on File Prior to Visit  Medication Sig Dispense Refill   acyclovir (ZOVIRAX) 400 MG tablet Take 1 tablet (400 mg total) by mouth 2 (two) times daily. 60 tablet 2   aspirin 81 MG chewable tablet Chew by mouth daily.     atenolol (TENORMIN) 50 MG tablet Take  1 tablet  Daily  for BP 90 tablet 3   triamcinolone cream (KENALOG) 0.1 % APPLY EXTERNALLY TO THE AFFECTED AREA THREE TIMES DAILY AS NEEDED FOR RASH 30 g 2   Vitamin D, Ergocalciferol, (DRISDOL) 1.25 MG (50000 UNIT) CAPS capsule TAKE 1 CAPSULE BY MOUTH TWICE WEEKLY 180 capsule 0   chlorhexidine (PERIDEX) 0.12 % solution 10 mLs as needed. (Patient not taking: Reported on 09/26/2022)     dexamethasone (DECADRON) 4 MG tablet Take 5 tablets (20 mg total) by mouth once a week. 1-2 hours before chemo injections weekly (Patient not taking: Reported on 09/26/2022) 30 tablet 1   lenalidomide (REVLIMID) 20 MG capsule Take 1 capsule (20 mg total) by mouth daily. Take for 14 days on, 7 days off. Repeat every 21 days.  [employer] Auth # [id number]; Date Obtained 08/06/22 (Patient not taking: Reported on 09/26/2022) 14 capsule 0   ondansetron (ZOFRAN) 8 MG tablet Take 8 mg by mouth 3 (three) times daily. (Patient not taking: Reported on 09/26/2022)     Current Facility-Administered Medications on File Prior to Visit  Medication Dose Route Frequency Provider Last Rate Last Admin   0.9 %  sodium chloride infusion  500 mL Intravenous Once Pyrtle, Lajuan Lines, MD       Health Maintenance:   Immunization History  Administered Date(s) Administered   Influenza Inj Mdck Quad With Preservative 07/26/2017, 08/11/2018, 07/13/2019, 07/25/2020   Influenza Split 06/30/2013, 07/20/2014, 07/21/2015   Influenza, High Dose Seasonal PF 07/05/2022   Influenza,inj,Quad PF,6+ Mos 08/04/2021   Influenza,inj,quad, With Preservative 07/05/2016   Influenza-Unspecified 07/24/2022   PFIZER(Purple Top)SARS-COV-2 Vaccination 12/05/2019, 12/26/2019, 07/23/2020   PPD Test 02/22/2014, 05/10/2015   Pfizer Covid-19 Vaccine Bivalent Booster 32yr & up 06/21/2022   Tdap 10/09/2011   Zoster Recombinat (Shingrix) 01/09/2017, 02/06/2018, 02/06/2018   Colonoscopy:05/2018 Mammo: 08/2019  BMD: 2015 WNL will wait until closer to 66 Pap/ Pelvic: 2018 WNL - HPV neg DUE AB [place]2011 CXR 2014 Echo 01/2013  EYE: Dr. Peter Garter, last 2018, wears glasses Dentist: Dr. Glynn Octave, Q 82month  Allergies: No Known Allergies Medical History:  has GERD; Constipation; History of peptic ulcer disease; Family history of malignant neoplasm of gastrointestinal tract; Benign neoplasm of colon; Mobitz type 2 second degree and high-grade heart block; Hyperlipidemia; Vitamin D deficiency; Atrial fibrillation ([place]; HTN (hypertension); Abnormal glucose; Medication management; Overweight (BMI 25.0-29.9); Meralgia paraesthetica, left; Plantar fasciitis of left foot; Lateral epicondylitis of left elbow; Osteoarthritis of multiple joints; Salivary stone; History of renal calculi; and  Multiple myeloma (HCC) on their problem list. Surgical History:  She  has a past surgical history that includes Breast cyst excision (1980); Wrist surgery (Right, 2011); permanent pacemaker insertion (N/A, 01/13/2013); Colonoscopy; Polypectomy; Plantar fasciectomy; and Extracorporeal shock wave lithotripsy (Right, 12/28/2019). Family History:  Her family history includes Anemia in her sister; Cervical cancer in her mother; Colon cancer (age of onset: 577 in her sister; Diabetes in her mother; HIV/AIDS in her brother; Heart disease in her father; Hyperlipidemia in her father; Hypertension in her mother and son; Kidney disease in her son; Leukemia in her sister; Multiple myeloma in her maternal aunt; Multiple myeloma (age of onset: 336 in her son; Prostate cancer (age of onset: 670 in her brother. Social History:   reports that she has never smoked. She has never used smokeless tobacco. She reports that she does not drink alcohol and does not use drugs.  Review of Systems: Review of Systems  Constitutional:  Negative for chills, fever, malaise/fatigue and weight loss.  HENT:  Negative for ear discharge, ear pain, hearing loss, nosebleeds and tinnitus.   Eyes:  Negative for blurred vision, double vision, photophobia, pain and discharge.  Respiratory:  Negative for cough, hemoptysis, sputum production and shortness of breath.   Cardiovascular:  Negative for chest pain, palpitations, orthopnea and claudication.  Gastrointestinal:  Negative for abdominal pain, constipation, diarrhea, heartburn, nausea and vomiting.  Genitourinary:  Negative for dysuria, flank pain, frequency, hematuria and urgency.  Musculoskeletal:  Positive for joint pain. Negative for back pain, falls, myalgias and neck pain.       Hands, intermittent  Skin:  Negative for itching and rash.  Neurological:  Negative for dizziness, tingling, tremors, sensory change, speech change, focal weakness, weakness and headaches.   Endo/Heme/Allergies:  Negative for environmental allergies and polydipsia. Does not bruise/bleed easily.  Psychiatric/Behavioral:  Negative for depression, hallucinations, substance abuse and suicidal ideas. The patient is not nervous/anxious.     Physical Exam: Estimated body mass index is 32.51 kg/m as calculated from the following:   Height as of this encounter: _0  (1.626 m).   Weight as of this encounter: 189 lb 6.4 oz (85.9 kg). BP 128/88   Pulse 99   Temp (!) 97.3 F (36.3 C)   Ht _1  (1.626 m)   Wt 189 lb 6.4 oz (85.9 kg)   SpO2 (!) 78%   BMI 32.51 kg/m  General Appearance: Well nourished, in no apparent distress.  Eyes: PERRLA, EOMs, conjunctiva no swelling or erythema, normal fundi and vessels.  Sinuses: No Frontal/maxillary tenderness  ENT/Mouth: Ext aud canals clear, normal light reflex with TMs without erythema, bulging. Good dentition. No erythema, swelling, or exudate on post pharynx. Tonsils not swollen or erythematous. Hearing normal.  Neck: Supple, thyroid normal. No bruits  Respiratory: Respiratory effort normal, BS equal bilaterally without rales, rhonchi, wheezing or stridor.  Cardio: RRR without murmurs, rubs or gallops. Brisk peripheral pulses without edema.  Chest: symmetric, with normal excursions  and percussion.  Abdomen: Soft, nontender, no guarding, rebound, hernias, masses, or organomegaly.  Lymphatics: Non tender without lymphadenopathy.  Musculoskeletal: Full ROM all peripheral extremities,5/5 strength, and normal gait, Skin: Warm, dry without rashes, lesions, ecchymosis. Neuro: Cranial nerves intact, reflexes equal bilaterally. Normal muscle tone, no cerebellar symptoms. Sensation intact.  Psych: Awake and oriented X 3, normal affect, Insight and Judgment appropriate.     [facility]  09/26/2022

## 2022-09-26 ENCOUNTER — Encounter: Payer: Self-pay | Admitting: Nurse Practitioner

## 2022-09-26 ENCOUNTER — Ambulatory Visit (INDEPENDENT_AMBULATORY_CARE_PROVIDER_SITE_OTHER): Payer: Medicare Other | Admitting: Nurse Practitioner

## 2022-09-26 VITALS — BP 128/88 | HR 99 | Temp 97.3°F | Ht 64.0 in | Wt 189.4 lb

## 2022-09-26 DIAGNOSIS — I441 Atrioventricular block, second degree: Secondary | ICD-10-CM

## 2022-09-26 DIAGNOSIS — D126 Benign neoplasm of colon, unspecified: Secondary | ICD-10-CM

## 2022-09-26 DIAGNOSIS — I10 Essential (primary) hypertension: Secondary | ICD-10-CM | POA: Diagnosis not present

## 2022-09-26 DIAGNOSIS — I480 Paroxysmal atrial fibrillation: Secondary | ICD-10-CM

## 2022-09-26 DIAGNOSIS — C9000 Multiple myeloma not having achieved remission: Secondary | ICD-10-CM

## 2022-09-26 DIAGNOSIS — D649 Anemia, unspecified: Secondary | ICD-10-CM

## 2022-09-26 DIAGNOSIS — Z79899 Other long term (current) drug therapy: Secondary | ICD-10-CM

## 2022-09-26 DIAGNOSIS — K219 Gastro-esophageal reflux disease without esophagitis: Secondary | ICD-10-CM

## 2022-09-26 DIAGNOSIS — Z8711 Personal history of peptic ulcer disease: Secondary | ICD-10-CM

## 2022-09-26 DIAGNOSIS — E7800 Pure hypercholesterolemia, unspecified: Secondary | ICD-10-CM

## 2022-09-26 DIAGNOSIS — R7309 Other abnormal glucose: Secondary | ICD-10-CM

## 2022-09-26 DIAGNOSIS — E663 Overweight: Secondary | ICD-10-CM

## 2022-09-26 DIAGNOSIS — E559 Vitamin D deficiency, unspecified: Secondary | ICD-10-CM

## 2022-09-26 NOTE — Patient Instructions (Signed)

## 2022-09-27 ENCOUNTER — Encounter: Payer: Self-pay | Admitting: Hematology

## 2022-09-27 LAB — CBC WITH DIFFERENTIAL/PLATELET
Absolute Monocytes: 384 cells/uL (ref 200–950)
Basophils Absolute: 10 cells/uL (ref 0–200)
Basophils Relative: 0.4 %
Eosinophils Absolute: 127 cells/uL (ref 15–500)
Eosinophils Relative: 5.3 %
HCT: 36.9 % (ref 35.0–45.0)
Hemoglobin: 12.4 g/dL (ref 11.7–15.5)
Lymphs Abs: 938 cells/uL (ref 850–3900)
MCH: 31.1 pg (ref 27.0–33.0)
MCHC: 33.6 g/dL (ref 32.0–36.0)
MCV: 92.5 fL (ref 80.0–100.0)
MPV: 10.7 fL (ref 7.5–12.5)
Monocytes Relative: 16 %
Neutro Abs: 941 cells/uL — ABNORMAL LOW (ref 1500–7800)
Neutrophils Relative %: 39.2 %
Platelets: 171 10*3/uL (ref 140–400)
RBC: 3.99 10*6/uL (ref 3.80–5.10)
RDW: 15.1 % — ABNORMAL HIGH (ref 11.0–15.0)
Total Lymphocyte: 39.1 %
WBC: 2.4 10*3/uL — ABNORMAL LOW (ref 3.8–10.8)

## 2022-09-27 LAB — VITAMIN D 25 HYDROXY (VIT D DEFICIENCY, FRACTURES): Vit D, 25-Hydroxy: 68 ng/mL (ref 30–100)

## 2022-09-27 LAB — LIPID PANEL
Cholesterol: 218 mg/dL — ABNORMAL HIGH (ref <200)
HDL: 50 mg/dL (ref >=50)
LDL Cholesterol (Calc): 136 mg/dL (calc) — ABNORMAL HIGH
Non-HDL Cholesterol (Calc): 168 mg/dL (calc) — ABNORMAL HIGH (ref <130)
Total CHOL/HDL Ratio: 4.4 (calc) (ref <5.0)
Triglycerides: 185 mg/dL — ABNORMAL HIGH (ref <150)

## 2022-09-27 LAB — COMPLETE METABOLIC PANEL WITH GFR
AG Ratio: 2.2 (calc) (ref 1.0–2.5)
ALT: 28 U/L (ref 6–29)
AST: 15 U/L (ref 10–35)
Albumin: 4.2 g/dL (ref 3.6–5.1)
Alkaline phosphatase (APISO): 129 U/L (ref 37–153)
BUN: 13 mg/dL (ref 7–25)
CO2: 28 mmol/L (ref 20–32)
Calcium: 9.2 mg/dL (ref 8.6–10.4)
Chloride: 108 mmol/L (ref 98–110)
Creat: 0.68 mg/dL (ref 0.50–1.05)
Globulin: 1.9 g/dL (calc) (ref 1.9–3.7)
Glucose, Bld: 97 mg/dL (ref 65–99)
Potassium: 4.4 mmol/L (ref 3.5–5.3)
Sodium: 145 mmol/L (ref 135–146)
Total Bilirubin: 0.5 mg/dL (ref 0.2–1.2)
Total Protein: 6.1 g/dL (ref 6.1–8.1)
eGFR: 97 mL/min/{1.73_m2} (ref >=60)

## 2022-09-27 LAB — TSH: TSH: 3.18 mIU/L (ref 0.40–4.50)

## 2022-10-11 ENCOUNTER — Ambulatory Visit: Payer: BLUE CROSS/BLUE SHIELD | Admitting: Nurse Practitioner

## 2022-10-13 ENCOUNTER — Other Ambulatory Visit: Payer: Self-pay

## 2022-10-25 ENCOUNTER — Encounter: Payer: Self-pay | Admitting: Hematology

## 2022-10-31 ENCOUNTER — Other Ambulatory Visit: Payer: Self-pay | Admitting: Internal Medicine

## 2022-10-31 ENCOUNTER — Other Ambulatory Visit: Payer: Self-pay

## 2022-10-31 DIAGNOSIS — C9000 Multiple myeloma not having achieved remission: Secondary | ICD-10-CM

## 2022-11-01 ENCOUNTER — Inpatient Hospital Stay: Payer: Medicare Other | Attending: Hematology

## 2022-11-01 DIAGNOSIS — C9000 Multiple myeloma not having achieved remission: Secondary | ICD-10-CM | POA: Insufficient documentation

## 2022-11-01 DIAGNOSIS — D649 Anemia, unspecified: Secondary | ICD-10-CM

## 2022-11-01 LAB — CBC WITH DIFFERENTIAL (CANCER CENTER ONLY)
Abs Immature Granulocytes: 0.04 10*3/uL (ref 0.00–0.07)
Basophils Absolute: 0 10*3/uL (ref 0.0–0.1)
Basophils Relative: 1 %
Eosinophils Absolute: 0 10*3/uL (ref 0.0–0.5)
Eosinophils Relative: 0 %
HCT: 31.6 % — ABNORMAL LOW (ref 36.0–46.0)
Hemoglobin: 10.7 g/dL — ABNORMAL LOW (ref 12.0–15.0)
Immature Granulocytes: 1 %
Lymphocytes Relative: 54 %
Lymphs Abs: 2.2 10*3/uL (ref 0.7–4.0)
MCH: 30.2 pg (ref 26.0–34.0)
MCHC: 33.9 g/dL (ref 30.0–36.0)
MCV: 89.3 fL (ref 80.0–100.0)
Monocytes Absolute: 0.8 10*3/uL (ref 0.1–1.0)
Monocytes Relative: 20 %
Neutro Abs: 1 10*3/uL — ABNORMAL LOW (ref 1.7–7.7)
Neutrophils Relative %: 24 %
Platelet Count: 241 10*3/uL (ref 150–400)
RBC: 3.54 MIL/uL — ABNORMAL LOW (ref 3.87–5.11)
RDW: 16.6 % — ABNORMAL HIGH (ref 11.5–15.5)
Smear Review: NORMAL
WBC Count: 4 10*3/uL (ref 4.0–10.5)
nRBC: 0 % (ref 0.0–0.2)

## 2022-11-01 LAB — COMPREHENSIVE METABOLIC PANEL
ALT: 32 U/L (ref 0–44)
AST: 25 U/L (ref 15–41)
Albumin: 3.6 g/dL (ref 3.5–5.0)
Alkaline Phosphatase: 90 U/L (ref 38–126)
Anion gap: 5 (ref 5–15)
BUN: 12 mg/dL (ref 8–23)
CO2: 30 mmol/L (ref 22–32)
Calcium: 9.3 mg/dL (ref 8.9–10.3)
Chloride: 106 mmol/L (ref 98–111)
Creatinine, Ser: 0.65 mg/dL (ref 0.44–1.00)
GFR, Estimated: >60 mL/min (ref >60)
Glucose, Bld: 95 mg/dL (ref 70–99)
Potassium: 4.7 mmol/L (ref 3.5–5.1)
Sodium: 141 mmol/L (ref 135–145)
Total Bilirubin: 0.3 mg/dL (ref 0.3–1.2)
Total Protein: 6.1 g/dL — ABNORMAL LOW (ref 6.5–8.1)

## 2022-11-01 LAB — MAGNESIUM: Magnesium: 2 mg/dL (ref 1.7–2.4)

## 2022-11-02 NOTE — Progress Notes (Signed)
Faxed pt's recent labs to Dr. Aris Lot with [facility] BMT Team [phone]).  Fax confirmation received.

## 2022-11-08 ENCOUNTER — Ambulatory Visit (INDEPENDENT_AMBULATORY_CARE_PROVIDER_SITE_OTHER): Payer: Medicare Other | Admitting: Nurse Practitioner

## 2022-11-08 ENCOUNTER — Encounter: Payer: Self-pay | Admitting: Nurse Practitioner

## 2022-11-08 ENCOUNTER — Other Ambulatory Visit: Payer: Self-pay | Admitting: Nurse Practitioner

## 2022-11-08 ENCOUNTER — Other Ambulatory Visit: Payer: Self-pay

## 2022-11-08 ENCOUNTER — Telehealth: Payer: Self-pay | Admitting: Nurse Practitioner

## 2022-11-08 ENCOUNTER — Encounter: Payer: Self-pay | Admitting: Hematology

## 2022-11-08 VITALS — BP 138/72 | HR 91 | Temp 96.8°F | Ht 64.0 in | Wt 181.4 lb

## 2022-11-08 DIAGNOSIS — K1230 Oral mucositis (ulcerative), unspecified: Secondary | ICD-10-CM

## 2022-11-08 DIAGNOSIS — J029 Acute pharyngitis, unspecified: Secondary | ICD-10-CM | POA: Diagnosis not present

## 2022-11-08 DIAGNOSIS — J387 Other diseases of larynx: Secondary | ICD-10-CM

## 2022-11-08 LAB — POC COVID19 BINAXNOW: SARS Coronavirus 2 Ag: NEGATIVE

## 2022-11-08 LAB — POCT INFLUENZA A/B
Influenza A, POC: NEGATIVE
Influenza B, POC: NEGATIVE

## 2022-11-08 LAB — POCT RAPID STREP A (OFFICE): Rapid Strep A Screen: NEGATIVE

## 2022-11-08 MED ORDER — NYSTATIN 100000 UNIT/ML MT SUSP: OROMUCOSAL | 3 refills | Status: DC

## 2022-11-08 NOTE — Patient Instructions (Signed)
Oral Mucositis Care In this video, you'll learn how cancer treatment can causes mouth sore or oral mucositis, and what you can do to manage it. To view the content, go to this web address: [email or web address]  This video will expire on: 06/12/2024. If you need access to this video following this date, please reach out to the healthcare provider who assigned it to you. This information is not intended to replace advice given to you by your health care provider. Make sure you discuss any questions you have with your health care provider. Elsevier Patient Education  [place].

## 2022-11-08 NOTE — Telephone Encounter (Signed)
Pt called back bc pharm only has mouthwash available ut she said that [place] talked about an abx she wanted her to take as well.

## 2022-11-08 NOTE — Addendum Note (Signed)
Addended by: Chancy Hurter on: 11/08/2022 04:30 PM   Modules accepted: Orders

## 2022-11-08 NOTE — Progress Notes (Signed)
Assessment and Plan:  Alyssa Dudley was seen today for an episodic visit.  Diagnoses and all order for this visit:  Sore throat/throat ulcer/mucositis Negative Covid, Flu Negative rapid strep. Cx sent given immunosuppressed - will treat with abx if positive cx results. Start magic mouthwash Consider warm salt water gargles several times throughout the day. If s/s fail to improve or worsen  , contact office .  - POCT rapid strep A - Covid  - Flu - magic mouthwash (nystatin, hydrocortisone, diphenhydrAMINE) suspension; Take  1 teaspoon (5 ml)   Swish & Swallow  every 2 hours for mouth ulcers  Dispense: 240 mL; Refill: 3  Orders Placed This Encounter  Procedures   Culture, beta strep (group b only)   POCT rapid strep A   POC COVID-19    Order Specific Question:   Previously tested for COVID-19    Answer:   No    Order Specific Question:   Resident in a congregate (group) care setting    Answer:   No    Order Specific Question:   Employed in healthcare setting    Answer:   No    Order Specific Question:   Pregnant    Answer:   No   Continue to monitor for any increase in fever, chills, N/V, diarrhea, changes to bowel habits, blood in stool.  Notify office for further evaluation and treatment, questions or concerns if s/s fail to improve. The risks and benefits of my recommendations, as well as other treatment options were discussed with the patient today. Questions were answered.  Further disposition pending results of labs. Discussed med's effects and SE's.    Over 15 minutes of exam, counseling, chart review, and critical decision making was performed.   Future Appointments  Date Time Provider [place]  11/12/2022  7:05 AM [facility]  12/12/2022  1:30 PM [facility] None  12/12/2022  2:00 PM [name], MD [facility] None  02/11/2023  7:05 AM [facility] DEVICE REMOTES [facility]  04/17/2023  9:00 AM  Alycia Rossetti, NP [facility] None  05/13/2023  7:05 AM [facility] DEVICE REMOTES [facility]    ------------------------------------------------------------------------------------------------------------------   HPI BP 138/72   Pulse 91   Temp (!) 96.8 F (36 C)   Ht '5\' 4"'$  (1.626 m)   Wt 181 lb 6.4 oz (82.3 kg)   SpO2 99%   BMI 31.14 kg/m   67 y.o.female presents for evaluation of sore throat most notably on the left side of the throat that occurred x2 days ago.  Denies fever, chills, N/V, cough, nasal congestion.    Associated painful swallowing.  She has a hx of MM and recently underwent stem cell transplant three weeks ago.    Past Medical History:  Diagnosis Date   Allergy    once a year    Hyperlipidemia    borderline- no meds    Pacemaker    Paroxysmal atrial fibrillation ([place]) 6/15   detected on ppm interrogation   PONV (postoperative nausea and vomiting)    has vomitted in past   Second degree Mobitz II AV block    Vitamin D deficiency      No Known Allergies  Current Outpatient Medications on File Prior to Visit  Medication Sig   acyclovir (ZOVIRAX) 400 MG tablet Take 1 tablet (400 mg total) by mouth 2 (two) times daily.   aspirin 81 MG chewable tablet Chew by mouth daily.   atenolol (TENORMIN) 50 MG  tablet TAKE 1 TABLET BY MOUTH DAILY FOR BLOOD PRESSURE   chlorhexidine (PERIDEX) 0.12 % solution 10 mLs as needed. (Patient not taking: Reported on 09/26/2022)   dexamethasone (DECADRON) 4 MG tablet Take 5 tablets (20 mg total) by mouth once a week. 1-2 hours before chemo injections weekly (Patient not taking: Reported on 09/26/2022)   lenalidomide (REVLIMID) 20 MG capsule Take 1 capsule (20 mg total) by mouth daily. Take for 14 days on, 7 days off. Repeat every 21 days. [employer] Auth # [id number]; Date Obtained 08/06/22 (Patient not taking: Reported on 09/26/2022)   ondansetron (ZOFRAN) 8 MG tablet Take 8 mg by mouth 3 (three) times daily. (Patient  not taking: Reported on 09/26/2022)   triamcinolone cream (KENALOG) 0.1 % APPLY EXTERNALLY TO THE AFFECTED AREA THREE TIMES DAILY AS NEEDED FOR RASH   Vitamin D, Ergocalciferol, (DRISDOL) 1.25 MG (50000 UNIT) CAPS capsule TAKE 1 CAPSULE BY MOUTH TWICE WEEKLY   Current Facility-Administered Medications on File Prior to Visit  Medication   0.9 %  sodium chloride infusion    ROS: all negative except what is noted in the HPI.   Physical Exam:  BP 138/72   Pulse 91   Temp (!) 96.8 F (36 C)   Ht '5\' 4"'$  (1.626 m)   Wt 181 lb 6.4 oz (82.3 kg)   SpO2 99%   BMI 31.14 kg/m   General Appearance: NAD.  Awake, conversant and cooperative. Eyes: PERRLA, EOMs intact.  Sclera white.  Conjunctiva without erythema. Sinuses: No frontal/maxillary tenderness.  No nasal discharge. Nares patent.  ENT/Mouth: Ext aud canals clear.  Bilateral TMs w/DOL and without erythema or bulging. Hearing intact.  Posterior pharynx with x3 round flat white ulcerations to left tonsil.  No swelling or exudate.  Tonsils without swelling or erythema.  Neck: Supple.  No masses, nodules or thyromegaly. Respiratory: Effort is regular with non-labored breathing. Breath sounds are equal bilaterally without rales, rhonchi, wheezing or stridor.  Cardio: RRR with no MRGs. Brisk peripheral pulses without edema.  Abdomen: Active BS in all four quadrants.  Soft and non-tender without guarding, rebound tenderness, hernias or masses. Lymphatics: Non tender without lymphadenopathy.  Musculoskeletal: Full ROM, 5/5 strength, normal ambulation.  No clubbing or cyanosis. Skin: Appropriate color for ethnicity. Warm without rashes, lesions, ecchymosis, ulcers.  Neuro: CN II-XII grossly normal. Normal muscle tone without cerebellar symptoms and intact sensation.   Psych: AO X 3,  appropriate mood and affect, insight and judgment.    Darrol Jump, NP 12:48 PM [facility]

## 2022-11-10 LAB — CULTURE, GROUP A STREP
MICRO NUMBER:: 14507097
SPECIMEN QUALITY:: ADEQUATE

## 2022-11-12 ENCOUNTER — Ambulatory Visit: Payer: Medicare Other

## 2022-11-12 DIAGNOSIS — I441 Atrioventricular block, second degree: Secondary | ICD-10-CM

## 2022-11-13 LAB — CUP PACEART REMOTE DEVICE CHECK
Battery Remaining Longevity: 9 mo
Battery Remaining Percentage: 7 %
Battery Voltage: 2.78 V
Brady Statistic AP VP Percent: 54 %
Brady Statistic AP VS Percent: 1 %
Brady Statistic AS VP Percent: 46 %
Brady Statistic AS VS Percent: 1 %
Brady Statistic RA Percent Paced: 53 %
Brady Statistic RV Percent Paced: 99 %
Date Time Interrogation Session: 20240205133733
Implantable Lead Connection Status: 753985
Implantable Lead Connection Status: 753985
Implantable Lead Implant Date: 20140408
Implantable Lead Implant Date: 20140408
Implantable Lead Location: 753859
Implantable Lead Location: 753860
Implantable Lead Model: 1944
Implantable Lead Model: 1948
Implantable Pulse Generator Implant Date: 20140408
Lead Channel Impedance Value: 490 Ohm
Lead Channel Impedance Value: 550 Ohm
Lead Channel Pacing Threshold Amplitude: 0.375 V
Lead Channel Pacing Threshold Amplitude: 0.75 V
Lead Channel Pacing Threshold Pulse Width: 0.4 ms
Lead Channel Pacing Threshold Pulse Width: 0.4 ms
Lead Channel Sensing Intrinsic Amplitude: 12 mV
Lead Channel Sensing Intrinsic Amplitude: 3.7 mV
Lead Channel Setting Pacing Amplitude: 1 V
Lead Channel Setting Pacing Amplitude: 1.375
Lead Channel Setting Pacing Pulse Width: 0.4 ms
Lead Channel Setting Sensing Sensitivity: 2 mV
Pulse Gen Model: 2210
Pulse Gen Serial Number: 7457437

## 2022-12-11 NOTE — Assessment & Plan Note (Signed)
IgG lambda type, stage II, high risk   -diagnosed in 04/2022, baseline M protein at 4.5, IgG at 6,049, lambda free light chain at '12913mg'$ /L, 60% plasma cell in marrow. Staging PET showed diffuse bone marrow activity  -s/p induction chemo Revlimid and DVd from 05/29/2022-08/27/2022 -s/p autologous stem cell transplant at [facility] on October 12, 2021

## 2022-12-12 ENCOUNTER — Inpatient Hospital Stay: Payer: Medicare Other | Attending: Hematology | Admitting: Hematology

## 2022-12-12 ENCOUNTER — Encounter: Payer: Self-pay | Admitting: Hematology

## 2022-12-12 ENCOUNTER — Inpatient Hospital Stay: Payer: Medicare Other

## 2022-12-12 ENCOUNTER — Other Ambulatory Visit: Payer: Self-pay

## 2022-12-12 VITALS — BP 151/68 | HR 88 | Temp 97.8°F | Resp 17 | Ht 64.0 in | Wt 186.3 lb

## 2022-12-12 DIAGNOSIS — D649 Anemia, unspecified: Secondary | ICD-10-CM

## 2022-12-12 DIAGNOSIS — C9000 Multiple myeloma not having achieved remission: Secondary | ICD-10-CM | POA: Diagnosis not present

## 2022-12-12 LAB — COMPREHENSIVE METABOLIC PANEL
ALT: 19 U/L (ref 0–44)
AST: 19 U/L (ref 15–41)
Albumin: 4.4 g/dL (ref 3.5–5.0)
Alkaline Phosphatase: 97 U/L (ref 38–126)
Anion gap: 6 (ref 5–15)
BUN: 16 mg/dL (ref 8–23)
CO2: 29 mmol/L (ref 22–32)
Calcium: 9.8 mg/dL (ref 8.9–10.3)
Chloride: 107 mmol/L (ref 98–111)
Creatinine, Ser: 0.88 mg/dL (ref 0.44–1.00)
GFR, Estimated: >60 mL/min (ref >60)
Glucose, Bld: 89 mg/dL (ref 70–99)
Potassium: 4.3 mmol/L (ref 3.5–5.1)
Sodium: 142 mmol/L (ref 135–145)
Total Bilirubin: 0.5 mg/dL (ref 0.3–1.2)
Total Protein: 6.8 g/dL (ref 6.5–8.1)

## 2022-12-12 LAB — CBC WITH DIFFERENTIAL (CANCER CENTER ONLY)
Abs Immature Granulocytes: 0 10*3/uL (ref 0.00–0.07)
Basophils Absolute: 0 10*3/uL (ref 0.0–0.1)
Basophils Relative: 1 %
Eosinophils Absolute: 0.1 10*3/uL (ref 0.0–0.5)
Eosinophils Relative: 2 %
HCT: 36.7 % (ref 36.0–46.0)
Hemoglobin: 12.7 g/dL (ref 12.0–15.0)
Immature Granulocytes: 0 %
Lymphocytes Relative: 53 %
Lymphs Abs: 2.6 10*3/uL (ref 0.7–4.0)
MCH: 32 pg (ref 26.0–34.0)
MCHC: 34.6 g/dL (ref 30.0–36.0)
MCV: 92.4 fL (ref 80.0–100.0)
Monocytes Absolute: 0.6 10*3/uL (ref 0.1–1.0)
Monocytes Relative: 12 %
Neutro Abs: 1.6 10*3/uL — ABNORMAL LOW (ref 1.7–7.7)
Neutrophils Relative %: 32 %
Platelet Count: 188 10*3/uL (ref 150–400)
RBC: 3.97 MIL/uL (ref 3.87–5.11)
RDW: 16.5 % — ABNORMAL HIGH (ref 11.5–15.5)
WBC Count: 4.8 10*3/uL (ref 4.0–10.5)
nRBC: 0 % (ref 0.0–0.2)

## 2022-12-12 NOTE — Progress Notes (Signed)
[name]   [phone] [phone]   Clinic Follow up Note   Patient Care Team: Unk Pinto, MD as PCP - General (Internal Medicine) Sable Feil, MD as Consulting Physician (Gastroenterology) Camillo Flaming, [place] as Referring Physician (Optometry) Fay Records, MD as Consulting Physician (Cardiology) Daryll Brod, MD as Consulting Physician (Orthopedic Surgery) [name], MD as Referring Physician (Hematology and Oncology) [name], MD as Attending Physician (Hematology and Oncology)  Date of Service:  12/12/2022  CHIEF COMPLAINT: f/u of  Multiple myeloma    CURRENT THERAPY: Revlimid, Velcade, and daratumumab    ASSESSMENT:  Alyssa Dudley is a 67 y.o. female with   Multiple myeloma ([place]) IgG lambda type, stage II, high risk   -diagnosed in 04/2022, baseline M protein at 4.5, IgG at 6,049, lambda free light chain at '12913mg'$ /L, 60% plasma cell in marrow. Staging PET showed diffuse bone marrow activity  -s/p induction chemo Revlimid and DVd from 05/29/2022-08/27/2022 -s/p autologous stem cell transplant at [facility] on October 12, 2021 -She has recovered well from transplant, asymptomatic, no concerns. -Scheduled for repeated bone marrow biopsy at the [facility] on January 22, 2023.  She will follow-up with Dr. Quentin Ore after the biopsy, and the likely start maintenance Revlimid after next visit. -We discussed role for maintenance therapy and disease monitoring with her, and risk of future disease progression, she agrees with plan. -See her back in early May 2024 to start Revlimid.    PLAN: - Bone Marrow Biopsy scheduled at [facility] on 4/16, and see Dr. Aris Lot on 4/23, she will likely start maintenance Revlimid after that visit. -lab reviewed -lab,f/u in  2 months   SUMMARY OF ONCOLOGIC HISTORY: Oncology History Overview Note   Cancer Staging  Multiple myeloma ([place]) Staging form: Plasma Cell Myeloma and Plasma  Cell Disorders, AJCC 8th Edition - Clinical stage from 05/01/2022: RISS Stage II (Beta-2-microglobulin (mg/L): 3.2, Albumin (g/dL): 3.6, ISS: Stage I, High-risk cytogenetics: Absent, LDH: Elevated) - Signed by [name], MD on 06/03/2022 Stage prefix: Initial diagnosis Beta 2 microglobulin range (mg/L): Less than 3.5 Albumin range (g/dL): Greater than or equal to 3.5 Cytogenetics: 1p deletion     Multiple myeloma ([place])  05/01/2022 Cancer Staging   Staging form: Plasma Cell Myeloma and Plasma Cell Disorders, AJCC 8th Edition - Clinical stage from 05/01/2022: RISS Stage II (Beta-2-microglobulin (mg/L): 3.2, Albumin (g/dL): 3.6, ISS: Stage I, High-risk cytogenetics: Absent, LDH: Elevated) - Signed by [name], MD on 06/03/2022 Stage prefix: Initial diagnosis Beta 2 microglobulin range (mg/L): Less than 3.5 Albumin range (g/dL): Greater than or equal to 3.5 Cytogenetics: 1p deletion   05/08/2022 Initial Diagnosis   Multiple myeloma ([place])   05/14/2022 -  Chemotherapy   Patient is on Treatment Plan : [place] (Daratumumab IV) q21d x 6 Cycles (Induction/Consolidation)     05/15/2022 - 05/28/2022 Chemotherapy   Patient is on Treatment Plan : [place] (Daratumumab IV) q21d x 6 Cycles (Induction/Consolidation)        INTERVAL HISTORY:  Alyssa Dudley is here for a follow up of   Multiple myeloma  She was last seen by NP Mendel Ryder on 08/27/2022 She presents to the clinic alone. Pt stated she recover well from surgery. T state she had some diarrhea and very fatigue.    All other systems were reviewed with the patient and are negative.  MEDICAL HISTORY:  Past Medical History:  Diagnosis Date   Allergy  once a year    Hyperlipidemia    borderline- no meds    Pacemaker    Paroxysmal atrial fibrillation ([place]) 6/15   detected on ppm interrogation   PONV (postoperative nausea and vomiting)    has vomitted in  past   Second degree Mobitz II AV block    Vitamin D deficiency     SURGICAL HISTORY: Past Surgical History:  Procedure Laterality Date   BREAST CYST EXCISION  1980   COLONOSCOPY     EXTRACORPOREAL SHOCK WAVE LITHOTRIPSY Right 12/28/2019   Procedure: RIGHT EXTRACORPOREAL SHOCK WAVE LITHOTRIPSY (ESWL);  Surgeon: Raynelle Bring, MD;  Location: [facility];  Service: Urology;  Laterality: Right;   PERMANENT PACEMAKER INSERTION N/A 01/13/2013   SJM Accent DR RF implanted by Dr [name] for mobitz II AV block   PLANTAR FASCIECTOMY     POLYPECTOMY     WRIST SURGERY Right 2011   removed cyst    I have reviewed the social history and family history with the patient and they are unchanged from previous note.  ALLERGIES:  has No Known Allergies.  MEDICATIONS:  Current Outpatient Medications  Medication Sig Dispense Refill   acyclovir (ZOVIRAX) 400 MG tablet Take 1 tablet (400 mg total) by mouth 2 (two) times daily. 60 tablet 2   aspirin 81 MG chewable tablet Chew by mouth daily.     atenolol (TENORMIN) 50 MG tablet TAKE 1 TABLET BY MOUTH DAILY FOR BLOOD PRESSURE 90 tablet 3   chlorhexidine (PERIDEX) 0.12 % solution 10 mLs as needed. (Patient not taking: Reported on 09/26/2022)     dexamethasone (DECADRON) 4 MG tablet Take 5 tablets (20 mg total) by mouth once a week. 1-2 hours before chemo injections weekly (Patient not taking: Reported on 09/26/2022) 30 tablet 1   lenalidomide (REVLIMID) 20 MG capsule Take 1 capsule (20 mg total) by mouth daily. Take for 14 days on, 7 days off. Repeat every 21 days. [employer] Auth # [id number]; Date Obtained 08/06/22 (Patient not taking: Reported on 09/26/2022) 14 capsule 0   magic mouthwash (nystatin, hydrocortisone, diphenhydrAMINE) suspension Take  1 teaspoon (5 ml)   Swish & Swallow  every 2 hours for mouth ulcers 240 mL 3   ondansetron (ZOFRAN) 8 MG tablet Take 8 mg by mouth 3 (three) times daily. (Patient not taking: Reported on 09/26/2022)      triamcinolone cream (KENALOG) 0.1 % APPLY EXTERNALLY TO THE AFFECTED AREA THREE TIMES DAILY AS NEEDED FOR RASH 30 g 2   Vitamin D, Ergocalciferol, (DRISDOL) 1.25 MG (50000 UNIT) CAPS capsule TAKE 1 CAPSULE BY MOUTH TWICE WEEKLY 180 capsule 0   Current Facility-Administered Medications  Medication Dose Route Frequency Provider Last Rate Last Admin   0.9 %  sodium chloride infusion  500 mL Intravenous Once Pyrtle, Lajuan Lines, MD        PHYSICAL EXAMINATION: ECOG PERFORMANCE STATUS: 1 - Symptomatic but completely ambulatory  Vitals:   12/12/22 1359  BP: (!) 151/68  Pulse: 88  Resp: 17  Temp: 97.8 F (36.6 C)  SpO2: 100%   Wt Readings from Last 3 Encounters:  12/12/22 186 lb 4.8 oz (84.5 kg)  11/08/22 181 lb 6.4 oz (82.3 kg)  09/26/22 189 lb 6.4 oz (85.9 kg)       LUNGS:(-)  clear to auscultation and percussion with normal breathing effort HEART: (-) regular rate & rhythm and no murmurs and (-) no lower extremity edema ABDOMEN:(-) abdomen soft, (-) non-tender and normal bowel sounds  LABORATORY DATA:  I have reviewed the data as listed    Latest Ref Rng & Units 12/12/2022    1:30 PM 11/01/2022   11:29 AM 09/26/2022    4:38 PM  CBC  WBC 4.0 - 10.5 K/uL 4.8  4.0  2.4   Hemoglobin 12.0 - 15.0 g/dL 12.7  10.7  12.4   Hematocrit 36.0 - 46.0 % 36.7  31.6  36.9   Platelets 150 - 400 K/uL 188  241  171         Latest Ref Rng & Units 12/12/2022    1:30 PM 11/01/2022   11:29 AM 09/26/2022    4:38 PM  CMP  Glucose 70 - 99 mg/dL 89  95  97   BUN 8 - 23 mg/dL '16  12  13   '$ Creatinine 0.44 - 1.00 mg/dL 0.88  0.65  0.68   Sodium 135 - 145 mmol/L 142  141  145   Potassium 3.5 - 5.1 mmol/L 4.3  4.7  4.4   Chloride 98 - 111 mmol/L 107  106  108   CO2 22 - 32 mmol/L '29  30  28   '$ Calcium 8.9 - 10.3 mg/dL 9.8  9.3  9.2   Total Protein 6.5 - 8.1 g/dL 6.8  6.1  6.1   Total Bilirubin 0.3 - 1.2 mg/dL 0.5  0.3  0.5   Alkaline Phos 38 - 126 U/L 97  90    AST 15 - 41 U/L '19  25  15   '$ ALT 0 - 44  U/L 19  32  28       RADIOGRAPHIC STUDIES: I have personally reviewed the radiological images as listed and agreed with the findings in the report. No results found.    No orders of the defined types were placed in this encounter.  All questions were answered. The patient knows to call the clinic with any problems, questions or concerns. No barriers to learning was detected. The total time spent in the appointment was 20 minutes.     [name], MD 12/12/2022   Felicity Coyer, CMA, am acting as scribe for [name], MD.   I have reviewed the above documentation for accuracy and completeness, and I agree with the above.

## 2022-12-13 LAB — KAPPA/LAMBDA LIGHT CHAINS
Kappa free light chain: 8 mg/L (ref 3.3–19.4)
Kappa, lambda light chain ratio: 1.05 (ref 0.26–1.65)
Lambda free light chains: 7.6 mg/L (ref 5.7–26.3)

## 2022-12-14 ENCOUNTER — Telehealth: Payer: Self-pay | Admitting: Nurse Practitioner

## 2022-12-14 NOTE — Telephone Encounter (Signed)
Contacted patient to scheduled appointments. Patient is aware of appointments that are scheduled.   

## 2022-12-17 ENCOUNTER — Encounter: Payer: Self-pay | Admitting: Hematology

## 2022-12-17 LAB — MULTIPLE MYELOMA PANEL, SERUM
Albumin SerPl Elph-Mcnc: 3.8 g/dL (ref 2.9–4.4)
Albumin/Glob SerPl: 1.6 (ref 0.7–1.7)
Alpha 1: 0.2 g/dL (ref 0.0–0.4)
Alpha2 Glob SerPl Elph-Mcnc: 0.7 g/dL (ref 0.4–1.0)
B-Globulin SerPl Elph-Mcnc: 0.9 g/dL (ref 0.7–1.3)
Gamma Glob SerPl Elph-Mcnc: 0.6 g/dL (ref 0.4–1.8)
Globulin, Total: 2.5 g/dL (ref 2.2–3.9)
IgA: 19 mg/dL — ABNORMAL LOW (ref 87–352)
IgG (Immunoglobin G), Serum: 681 mg/dL (ref 586–1602)
IgM (Immunoglobulin M), Srm: 16 mg/dL — ABNORMAL LOW (ref 26–217)
M Protein SerPl Elph-Mcnc: 0.2 g/dL — ABNORMAL HIGH
Total Protein ELP: 6.3 g/dL (ref 6.0–8.5)

## 2022-12-27 NOTE — Progress Notes (Signed)
Remote pacemaker transmission.   

## 2023-01-03 ENCOUNTER — Encounter: Payer: Self-pay | Admitting: Internal Medicine

## 2023-01-03 NOTE — Telephone Encounter (Signed)
Error

## 2023-01-08 ENCOUNTER — Other Ambulatory Visit: Payer: Self-pay

## 2023-01-13 NOTE — Progress Notes (Unsigned)
Cardiology Office Note Date:  01/15/2023  Patient ID:  Alyssa Dudley, Alyssa Dudley [birth year], MRN [id number] PCP:  Lucky Cowboy, MD  Electrophysiologist: Dr. Johney Frame   Chief Complaint:   annual visit   History of Present Illness: Alyssa Dudley is a 67 y.o. female with history of HLD, AFib, Mobitz II AV block > PPM, GERD, HTN, multiple meyloma  She come today to be seen for Dr. Johney Frame, last seen by him Aug 2020 via tele health visit. 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 She was overdue for remotes, discussed getting her back on track with that.  Afib episodes were very short and infrequent, with CHA2DS2Vasc score of one, not on a/c.  I saw her march 2022 We dicussed her ER visit march last year, BP was >200/>100 associated with posterior headache and some floaters, felt to have been the HTN, not stroke. Since increase in her atenolol, BP has been controlled without recurrent symptomsl. No CP In the last 24mo though has felt like she has had more flutters, there are brief, not daily or weekly even. And feels like she gets a bit winded with activities that she did not used to. No dizzy spells, near syncope or syncope. Her PMD monitors her labs/lipids Her transmitter does not seem to be working I suspect her charted Dec weight of 167 was entered wrong Planned to update her echo with some DOE Ordered for a new transmitter  LVEF 60-65%, no WMA, Grade I DD, MR seen due to long AV delay, trivial/mild She was encouraged to start to walk/exercise to improve exertional capacity   I saw her 12/28/21 She is doing very well Did get started walking and her exertional capacity is good No concerns, denies any CP, palpitations, or cardiac awareness No SOB PMD monitors and manages her labs + AMS, some false burden <1%, discussed No a/c yet with score of 2 including gender.  Since this visit diagnosed with multiple myeloma, following with oncology,  Dr. Mosetta Putt.  Had stem cell transplant, on Revlimid, Velcade, and daratumumab  Saw oncology 12/12/22, pending repeat bone marrow bx 01/22/23 and f/u in May  TODAY She is doing well. Finally out and about after her restrictions post stem cell transplant, was very happy to get back to church. Hopes she is in remission by her next biopsy. No CP, palpitations or cardiac awareness Very worried about her PPM battery running out. No near syncope or syncope No SOB  Device information SJM dual chamber PPM implanted 01/13/2013   Past Medical History:  Diagnosis Date   Allergy    once a year    Hyperlipidemia    borderline- no meds    Pacemaker    Paroxysmal atrial fibrillation 6/15   detected on ppm interrogation   PONV (postoperative nausea and vomiting)    has vomitted in past   Second degree Mobitz II AV block    Vitamin D deficiency     Past Surgical History:  Procedure Laterality Date   BREAST CYST EXCISION  1980   COLONOSCOPY     EXTRACORPOREAL SHOCK WAVE LITHOTRIPSY Right 12/28/2019   Procedure: RIGHT EXTRACORPOREAL SHOCK WAVE LITHOTRIPSY (ESWL);  Surgeon: Heloise Purpura, MD;  Location: [facility];  Service: Urology;  Laterality: Right;   PERMANENT PACEMAKER INSERTION N/A 01/13/2013   SJM Accent DR RF implanted by Dr Johney Frame for mobitz II AV block   PLANTAR FASCIECTOMY     POLYPECTOMY  WRIST SURGERY Right 2011   removed cyst    Current Outpatient Medications  Medication Sig Dispense Refill   acyclovir (ZOVIRAX) 400 MG tablet Take 1 tablet (400 mg total) by mouth 2 (two) times daily. 60 tablet 2   aspirin 81 MG chewable tablet Chew by mouth daily.     atenolol (TENORMIN) 50 MG tablet TAKE 1 TABLET BY MOUTH DAILY FOR BLOOD PRESSURE 90 tablet 3   chlorhexidine (PERIDEX) 0.12 % solution 10 mLs as needed.     Multiple Vitamin (MULTIVITAMIN) tablet Take 1 tablet by mouth daily.     triamcinolone cream (KENALOG) 0.1 % APPLY EXTERNALLY TO THE AFFECTED AREA THREE TIMES  DAILY AS NEEDED FOR RASH 30 g 2   Vitamin D, Ergocalciferol, (DRISDOL) 1.25 MG (50000 UNIT) CAPS capsule TAKE 1 CAPSULE BY MOUTH TWICE WEEKLY 180 capsule 0   Current Facility-Administered Medications  Medication Dose Route Frequency Provider Last Rate Last Admin   0.9 %  sodium chloride infusion  500 mL Intravenous Once Pyrtle, Carie Caddy, MD        Allergies:   Patient has no known allergies.   Social History:  The patient  reports that she has never smoked. She has never used smokeless tobacco. She reports that she does not drink alcohol and does not use drugs.   Family History:  The patient's family history includes Anemia in her sister; Cervical cancer in her mother; Colon cancer (age of onset: 25) in her sister; Diabetes in her mother; HIV/AIDS in her brother; Heart disease in her father; Hyperlipidemia in her father; Hypertension in her mother and son; Kidney disease in her son; Leukemia in her sister; Multiple myeloma in her maternal aunt; Multiple myeloma (age of onset: 46) in her son; Prostate cancer (age of onset: 5) in her brother.  ROS:  Please see the history of present illness.    All other systems are reviewed and otherwise negative.   PHYSICAL EXAM:  VS:  BP 120/80   Pulse 63   Ht  (1.651 m)   Wt 186 lb 12.8 oz (84.7 kg)   SpO2 95%   BMI 31.09 kg/m  BMI: Body mass index is 31.09 kg/m. Well nourished, well developed, in no acute distress HEENT: normocephalic, atraumatic Neck: no JVD, carotid bruits or masses Cardiac:  RRR; no significant murmurs, no rubs, or gallops Lungs:  CTA b/l, no wheezing, rhonchi or rales Abd: soft, nontender MS: no deformity or atrophy Ext: no edema Skin: warm and dry, no rash Neuro:  No gross deficits appreciated Psych: euthymic mood, full affect  PPM site is stable, no tethering or discomfort   EKG:  Done today and reviewed by myself shows  SR/V paced 63bm   Device interrogation done today and reviewed by myself:  Battery  estimate is 6.7 mo Lead measurements are stable He is dependent at 40 All available EGMs are reviewed AMS are very short PAFlutter, ATach, PACs All together burden is <1% Longest episode 22 sec  01/25/21: TTE  1. Left ventricular ejection fraction, by estimation, is 60 to 65%. Left  ventricular ejection fraction by 3D volume is 67 %. The left ventricle has  normal function. The left ventricle has no regional wall motion  abnormalities. Left ventricular diastolic   parameters are consistent with Grade I diastolic dysfunction (impaired  relaxation).   2. Right ventricular systolic function is normal. The right ventricular  size is normal. There is normal pulmonary artery systolic pressure.   3. Mild  diastolic mitral insufficiency is seen due to long AV delay. The  mitral valve is normal in structure. Trivial mitral valve regurgitation.  No evidence of mitral stenosis.   4. Tricuspid valve regurgitation is mild to moderate.   5. The aortic valve is normal in structure. Aortic valve regurgitation is  not visualized. No aortic stenosis is present.   6. The inferior vena cava is normal in size with greater than 50%  respiratory variability, suggesting right atrial pressure of 3 mmHg.   01/07/2013: TTE Study Conclusions  - Left ventricle: The cavity size was normal. Systolic    function was normal. The estimated ejection fraction was    in the range of 60% to 65%. Wall motion was normal; there    were no regional wall motion abnormalities.  - Atrial septum: No defect or patent foramen ovale was    identified.  - Pulmonary arteries: PA peak pressure: 36mm Hg (S).   Recent Labs: 09/26/2022: TSH 3.18 11/01/2022: Magnesium 2.0 12/12/2022: ALT 19; BUN 16; Creatinine, Ser 0.88; Hemoglobin 12.7; Platelet Count 188; Potassium 4.3; Sodium 142  09/26/2022: Cholesterol 218; HDL 50; LDL Cholesterol (Calc) 136; Total CHOL/HDL Ratio 4.4; Triglycerides 185   CrCl cannot be calculated (Patient's  most recent lab result is older than the maximum 21 days allowed.).   Wt Readings from Last 3 Encounters:  01/15/23 186 lb 12.8 oz (84.7 kg)  12/12/22 186 lb 4.8 oz (84.5 kg)  11/08/22 181 lb 6.4 oz (82.3 kg)     Other studies reviewed: Additional studies/records reviewed today include: summarized above  ASSESSMENT AND PLAN:  1. PPM     Intact function     No programming changes made  I have asked device clinic to put her on monthly battery checks Will have her meet Dr. Lalla BrothersLambert next visit in about 70mo or so  2. Paroxysmal Afib     CHA2DS2Vasc is now 3 at 66y/o (including gender)     <1%  Burden, again not all were true AF     Follow burden  3. HLD     Not addressed today  She is aware of the need to change her EP MD, agreeable to see Dr. Lalla BrothersLambert  Disposition: as above    Current medicines are reviewed at length with the patient today.  The patient did not have any concerns regarding medicines.  Norma FredricksonSigned, Allona Gondek, PA-C 01/15/2023 10:20 AM     [facility] [address] [place] [address] [phone] (office)  [phone] (fax)

## 2023-01-15 ENCOUNTER — Ambulatory Visit: Payer: Medicare Other | Attending: Physician Assistant | Admitting: Physician Assistant

## 2023-01-15 ENCOUNTER — Encounter: Payer: Self-pay | Admitting: Physician Assistant

## 2023-01-15 VITALS — BP 120/80 | HR 63 | Ht 65.0 in | Wt 186.8 lb

## 2023-01-15 DIAGNOSIS — Z950 Presence of cardiac pacemaker: Secondary | ICD-10-CM | POA: Insufficient documentation

## 2023-01-15 DIAGNOSIS — I4892 Unspecified atrial flutter: Secondary | ICD-10-CM | POA: Insufficient documentation

## 2023-01-15 DIAGNOSIS — I441 Atrioventricular block, second degree: Secondary | ICD-10-CM | POA: Insufficient documentation

## 2023-01-15 LAB — CUP PACEART INCLINIC DEVICE CHECK
Battery Remaining Longevity: 6 mo
Battery Voltage: 2.74 V
Brady Statistic RA Percent Paced: 48 %
Brady Statistic RV Percent Paced: 99.81 %
Date Time Interrogation Session: 20240409132154
Implantable Lead Connection Status: 753985
Implantable Lead Connection Status: 753985
Implantable Lead Implant Date: 20140408
Implantable Lead Implant Date: 20140408
Implantable Lead Location: 753859
Implantable Lead Location: 753860
Implantable Lead Model: 1944
Implantable Lead Model: 1948
Implantable Pulse Generator Implant Date: 20140408
Lead Channel Impedance Value: 537.5 Ohm
Lead Channel Impedance Value: 600 Ohm
Lead Channel Pacing Threshold Amplitude: 0.375 V
Lead Channel Pacing Threshold Amplitude: 0.75 V
Lead Channel Pacing Threshold Pulse Width: 0.4 ms
Lead Channel Pacing Threshold Pulse Width: 0.4 ms
Lead Channel Sensing Intrinsic Amplitude: 12 mV
Lead Channel Sensing Intrinsic Amplitude: 4.2 mV
Lead Channel Setting Pacing Amplitude: 1 V
Lead Channel Setting Pacing Amplitude: 1.375
Lead Channel Setting Pacing Pulse Width: 0.4 ms
Lead Channel Setting Sensing Sensitivity: 2 mV
Pulse Gen Model: 2210
Pulse Gen Serial Number: 7457437

## 2023-01-15 NOTE — Patient Instructions (Signed)
Medication Instructions:  Your physician recommends that you continue on your current medications as directed. Please refer to the Current Medication list given to you today.  *If you need a refill on your cardiac medications before your next appointment, please call your pharmacy*  Lab Work: None ordered If you have labs (blood work) drawn today and your tests are completely normal, you will receive your results only by: MyChart Message (if you have MyChart) OR A paper copy in the mail If you have any lab test that is abnormal or we need to change your treatment, we will call you to review the results.  Follow-Up: At [facility], you and your health needs are our priority.  As part of our continuing mission to provide you with exceptional heart care, we have created designated Provider Care Teams.  These Care Teams include your primary Cardiologist (physician) and Advanced Practice Providers (APPs -  Physician Assistants and Nurse Practitioners) who all work together to provide you with the care you need, when you need it.  Your next appointment:   3 month(s)  Provider:   Steffanie Dunn, MD ONLY

## 2023-01-31 ENCOUNTER — Other Ambulatory Visit: Payer: Self-pay

## 2023-01-31 ENCOUNTER — Telehealth: Payer: Self-pay

## 2023-01-31 ENCOUNTER — Inpatient Hospital Stay: Payer: Medicare Other | Attending: Hematology

## 2023-01-31 DIAGNOSIS — D649 Anemia, unspecified: Secondary | ICD-10-CM

## 2023-01-31 DIAGNOSIS — Z87442 Personal history of urinary calculi: Secondary | ICD-10-CM

## 2023-01-31 DIAGNOSIS — C9000 Multiple myeloma not having achieved remission: Secondary | ICD-10-CM | POA: Diagnosis present

## 2023-01-31 NOTE — Telephone Encounter (Signed)
Brooke NP from [employer] called and stated they needed labs Protein Electrophoresis Serum and Immunoelectrophoresis Serum drawn today at her lab appointment. Made nurse Olegario Shearer RN aware she stated it was ok to add these labs.

## 2023-02-04 LAB — PROTEIN ELECTROPHORESIS, SERUM
A/G Ratio: 1.4 (ref 0.7–1.7)
Albumin ELP: 3.9 g/dL (ref 2.9–4.4)
Alpha-1-Globulin: 0.2 g/dL (ref 0.0–0.4)
Alpha-2-Globulin: 0.8 g/dL (ref 0.4–1.0)
Beta Globulin: 0.9 g/dL (ref 0.7–1.3)
Gamma Globulin: 1 g/dL (ref 0.4–1.8)
Globulin, Total: 2.8 g/dL (ref 2.2–3.9)
Total Protein ELP: 6.7 g/dL (ref 6.0–8.5)

## 2023-02-06 ENCOUNTER — Other Ambulatory Visit: Payer: Self-pay

## 2023-02-06 ENCOUNTER — Inpatient Hospital Stay (HOSPITAL_BASED_OUTPATIENT_CLINIC_OR_DEPARTMENT_OTHER): Payer: Medicare Other | Admitting: Nurse Practitioner

## 2023-02-06 ENCOUNTER — Inpatient Hospital Stay: Payer: Medicare Other | Attending: Hematology

## 2023-02-06 ENCOUNTER — Telehealth: Payer: Self-pay | Admitting: Hematology

## 2023-02-06 ENCOUNTER — Encounter: Payer: Self-pay | Admitting: Nurse Practitioner

## 2023-02-06 VITALS — BP 146/84 | HR 86 | Temp 98.1°F | Resp 20 | Wt 189.4 lb

## 2023-02-06 DIAGNOSIS — Z5112 Encounter for antineoplastic immunotherapy: Secondary | ICD-10-CM | POA: Diagnosis present

## 2023-02-06 DIAGNOSIS — C9000 Multiple myeloma not having achieved remission: Secondary | ICD-10-CM | POA: Insufficient documentation

## 2023-02-06 DIAGNOSIS — D649 Anemia, unspecified: Secondary | ICD-10-CM

## 2023-02-06 LAB — CBC WITH DIFFERENTIAL (CANCER CENTER ONLY)
Abs Immature Granulocytes: 0.01 10*3/uL (ref 0.00–0.07)
Basophils Absolute: 0 10*3/uL (ref 0.0–0.1)
Basophils Relative: 1 %
Eosinophils Absolute: 0 10*3/uL (ref 0.0–0.5)
Eosinophils Relative: 1 %
HCT: 36.9 % (ref 36.0–46.0)
Hemoglobin: 12.3 g/dL (ref 12.0–15.0)
Immature Granulocytes: 0 %
Lymphocytes Relative: 44 %
Lymphs Abs: 1.6 10*3/uL (ref 0.7–4.0)
MCH: 31.4 pg (ref 26.0–34.0)
MCHC: 33.3 g/dL (ref 30.0–36.0)
MCV: 94.1 fL (ref 80.0–100.0)
Monocytes Absolute: 0.3 10*3/uL (ref 0.1–1.0)
Monocytes Relative: 9 %
Neutro Abs: 1.7 10*3/uL (ref 1.7–7.7)
Neutrophils Relative %: 45 %
Platelet Count: 185 10*3/uL (ref 150–400)
RBC: 3.92 MIL/uL (ref 3.87–5.11)
RDW: 13.5 % (ref 11.5–15.5)
WBC Count: 3.8 10*3/uL — ABNORMAL LOW (ref 4.0–10.5)
nRBC: 0 % (ref 0.0–0.2)

## 2023-02-06 LAB — IMMUNOFIXATION ELECTROPHORESIS
IgA: 11 mg/dL — ABNORMAL LOW (ref 87–352)
IgG (Immunoglobin G), Serum: 1119 mg/dL (ref 586–1602)
IgM (Immunoglobulin M), Srm: 30 mg/dL (ref 26–217)
Total Protein ELP: 7.4 g/dL (ref 6.0–8.5)

## 2023-02-06 LAB — COMPREHENSIVE METABOLIC PANEL
ALT: 15 U/L (ref 0–44)
AST: 17 U/L (ref 15–41)
Albumin: 4.4 g/dL (ref 3.5–5.0)
Alkaline Phosphatase: 105 U/L (ref 38–126)
Anion gap: 5 (ref 5–15)
BUN: 13 mg/dL (ref 8–23)
CO2: 30 mmol/L (ref 22–32)
Calcium: 9.2 mg/dL (ref 8.9–10.3)
Chloride: 106 mmol/L (ref 98–111)
Creatinine, Ser: 0.67 mg/dL (ref 0.44–1.00)
GFR, Estimated: >60 mL/min (ref >60)
Glucose, Bld: 99 mg/dL (ref 70–99)
Potassium: 3.5 mmol/L (ref 3.5–5.1)
Sodium: 141 mmol/L (ref 135–145)
Total Bilirubin: 0.6 mg/dL (ref 0.3–1.2)
Total Protein: 7 g/dL (ref 6.5–8.1)

## 2023-02-06 MED ORDER — LENALIDOMIDE 10 MG PO CAPS: 20.0000 mg | ORAL_CAPSULE | Freq: Every day | ORAL | 0 refills | Status: DC

## 2023-02-06 MED ORDER — DEXAMETHASONE 4 MG PO TABS: 20.0000 mg | ORAL_TABLET | ORAL | 0 refills | Status: DC

## 2023-02-06 NOTE — Telephone Encounter (Signed)
Contacted patient to scheduled appointments. Patient is aware of appointments that are scheduled.   

## 2023-02-06 NOTE — Progress Notes (Cosign Needed)
Patient Care Team: Alyssa Cowboy, MD as PCP - General (Internal Medicine) Alyssa Layman, MD as Consulting Physician (Gastroenterology) Alyssa Dudley, OD as Referring Physician (Optometry) Alyssa Riffle, MD as Consulting Physician (Cardiology) Alyssa Salt, MD as Consulting Physician (Orthopedic Surgery) Alyssa Levy, MD as Referring Physician (Hematology and Oncology) Alyssa Mood, MD as Attending Physician (Hematology and Oncology)   CHIEF COMPLAINT: Follow up multiple myeloma  Oncology History Overview Note   Cancer Staging  Multiple myeloma [facility]) Staging form: Plasma Cell Myeloma and Plasma Cell Disorders, AJCC 8th Edition - Clinical stage from 05/01/2022: RISS Stage II (Beta-2-microglobulin (mg/L): 3.2, Albumin (g/dL): 3.6, ISS: Stage I, High-risk cytogenetics: Absent, LDH: Elevated) - Signed by Alyssa Mood, MD on 06/03/2022 Stage prefix: Initial diagnosis Beta 2 microglobulin range (mg/L): Less than 3.5 Albumin range (g/dL): Greater than or equal to 3.5 Cytogenetics: 1p deletion     Multiple myeloma (HCC)  05/01/2022 Cancer Staging   Staging form: Plasma Cell Myeloma and Plasma Cell Disorders, AJCC 8th Edition - Clinical stage from 05/01/2022: RISS Stage II (Beta-2-microglobulin (mg/L): 3.2, Albumin (g/dL): 3.6, ISS: Stage I, High-risk cytogenetics: Absent, LDH: Elevated) - Signed by Alyssa Mood, MD on 06/03/2022 Stage prefix: Initial diagnosis Beta 2 microglobulin range (mg/L): Less than 3.5 Albumin range (g/dL): Greater than or equal to 3.5 Cytogenetics: 1p deletion   05/08/2022 Initial Diagnosis   Multiple myeloma (HCC)   05/14/2022 -  Chemotherapy   Patient is on Treatment Plan : MYELOMA NEWLY DIAGNOSED TRANSPLANT CANDIDATE DaraVRd (Daratumumab IV) q21d x 6 Cycles (Induction/Consolidation)     05/15/2022 - 05/28/2022 Chemotherapy   Patient is on Treatment Plan : MYELOMA NEWLY DIAGNOSED TRANSPLANT CANDIDATE DaraVRd (Daratumumab IV) q21d x 6 Cycles  (Induction/Consolidation)        CURRENT THERAPY:  Induction Daratumumab, velcade, and revlimid (DaraVRd) - Revlimid dose reduced to 20 mg for neutropenia, completed x5 cycles Autologous stem cell transplant 10/12/22   INTERVAL HISTORY Alyssa Dudley returns for follow up as scheduled. Last seen by Dr. Mosetta Putt 11/13/22 and Dr. Rosaria Ferries 01/29/23. Post transplant work up showed NED, she is very happy. Back to work, feeling well. Denies any lingering side effects from chemo. Dr. Rosaria Ferries has recommended to complete 1 additional cycle of DaraVRd (for total of 6 cycles) before proceeding with maintenance dara and revlimid.   ROS  All other systems reviewed and negative   Past Medical History:  Diagnosis Date   Allergy    once a year    Hyperlipidemia    borderline- no meds    Pacemaker    Paroxysmal atrial fibrillation (HCC) 6/15   detected on ppm interrogation   PONV (postoperative nausea and vomiting)    has vomitted in past   Second degree Mobitz II AV block    Vitamin D deficiency      Past Surgical History:  Procedure Laterality Date   BREAST CYST EXCISION  1980   COLONOSCOPY     EXTRACORPOREAL SHOCK WAVE LITHOTRIPSY Right 12/28/2019   Procedure: RIGHT EXTRACORPOREAL SHOCK WAVE LITHOTRIPSY (ESWL);  Surgeon: Heloise Purpura, MD;  Location: [facility];  Service: Urology;  Laterality: Right;   PERMANENT PACEMAKER INSERTION N/A 01/13/2013   SJM [employer] DR RF implanted by Dr Alyssa Dudley for mobitz II AV block   PLANTAR FASCIECTOMY     POLYPECTOMY     WRIST SURGERY Right 2011   removed cyst     Outpatient Encounter Medications as of 02/06/2023  Medication Sig   acyclovir (ZOVIRAX) 400 MG  tablet Take 1 tablet (400 mg total) by mouth 2 (two) times daily.   aspirin 81 MG chewable tablet Chew by mouth daily.   atenolol (TENORMIN) 50 MG tablet TAKE 1 TABLET BY MOUTH DAILY FOR BLOOD PRESSURE   chlorhexidine (PERIDEX) 0.12 % solution 10 mLs as needed.   Multiple Vitamin (MULTIVITAMIN)  tablet Take 1 tablet by mouth daily.   triamcinolone cream (KENALOG) 0.1 % APPLY EXTERNALLY TO THE AFFECTED AREA THREE TIMES DAILY AS NEEDED FOR RASH   Vitamin D, Ergocalciferol, (DRISDOL) 1.25 MG (50000 UNIT) CAPS capsule TAKE 1 CAPSULE BY MOUTH TWICE WEEKLY   dexamethasone (DECADRON) 4 MG tablet Take 5 tablets (20 mg total) by mouth once a week. 1-2 hours before chemo injections weekly   Facility-Administered Encounter Medications as of 02/06/2023  Medication   0.9 %  sodium chloride infusion     Today's Vitals   02/06/23 1118 02/06/23 1124  BP: (!) 146/84   Pulse: 86   Resp: 20   Temp: 98.1 F (36.7 C)   SpO2: 100%   Weight: 189 lb 6.4 oz (85.9 kg)   PainSc:  0-No pain   Body mass index is 31.52 kg/m.   PHYSICAL EXAM GENERAL:alert, no distress and comfortable SKIN: no rash  EYES: sclera clear NECK: without mass LYMPH:  no palpable cervical or supraclavicular lymphadenopathy  LUNGS: clear with normal breathing effort HEART: regular rate & rhythm, no lower extremity edema ABDOMEN: abdomen soft, non-tender and normal bowel sounds NEURO: alert & oriented x 3 with fluent speech, no focal motor/sensory deficits   CBC    Component Value Date/Time   WBC 3.8 (L) 02/06/2023 1057   WBC 2.4 (L) 09/26/2022 1638   RBC 3.92 02/06/2023 1057   HGB 12.3 02/06/2023 1057   HCT 36.9 02/06/2023 1057   PLT 185 02/06/2023 1057   MCV 94.1 02/06/2023 1057   MCH 31.4 02/06/2023 1057   MCHC 33.3 02/06/2023 1057   RDW 13.5 02/06/2023 1057   LYMPHSABS 1.6 02/06/2023 1057   MONOABS 0.3 02/06/2023 1057   EOSABS 0.0 02/06/2023 1057   BASOSABS 0.0 02/06/2023 1057     CMP     Component Value Date/Time   NA 141 02/06/2023 1057   K 3.5 02/06/2023 1057   CL 106 02/06/2023 1057   CO2 30 02/06/2023 1057   GLUCOSE 99 02/06/2023 1057   BUN 13 02/06/2023 1057   CREATININE 0.67 02/06/2023 1057   CREATININE 0.68 09/26/2022 1638   CALCIUM 9.2 02/06/2023 1057   PROT 7.0 02/06/2023 1057    ALBUMIN 4.4 02/06/2023 1057   AST 17 02/06/2023 1057   AST 12 (L) 08/27/2022 0842   ALT 15 02/06/2023 1057   ALT 13 08/27/2022 0842   ALKPHOS 105 02/06/2023 1057   BILITOT 0.6 02/06/2023 1057   BILITOT 0.9 08/27/2022 0842   GFRNONAA >60 02/06/2023 1057   GFRNONAA >60 08/27/2022 0842   GFRNONAA 94 04/05/2021 1025   GFRAA 108 04/05/2021 1025     ASSESSMENT & PLAN:Alyssa Dudley is a 67 y.o. female with    1. Multiple Myeloma, IgG lambda type, stage II, high risk -Diagnosed with CRAB criteria and bone marrow bx 05/01/2022, pathology confirmed plasma cell myeloma (60% plasma cells). Cytogenics and FISH c/w standard risk multiple myeloma except the deletion of 1P and gain of 1 q. which is a higher risk feature.  She is being treated as high risk -S/p 5 cycles induction DaraVRd starting 05/15/22 followed by autologous stem cell transplant 10/12/22 by Dr.  Lambird. Post transplant PET and marrow showed no evidence of disease -Dr. Kerin Perna recommends 1 additional cycle of DaraVRd to complete a total of 6 cycles, before proceeding with dara and revlimid maintenance -Alyssa Dudley appears well, she has recovered completely with excellent PS. Labs WNL.  -Plan to start cycle 6 DaraVRd next week, then proceed with maintenance -Pt seen with Dr. Mosetta Putt   3.  Atrial fibrillation, hypertension, pacemaker  -On daily aspirin   PLAN: -Transplant notes, path, and today's labs reviewed -C6 DaraVRd (weekly x3) and Revlimid 20 mg, to start next week -Refill Dex and Revlimid 20 mg po daily on days 1-14 -F/up in 4 weeks to start maintenance treatment   Orders Placed This Encounter  Procedures   CBC with Differential (Cancer Center Only)    Standing Status:   Future    Standing Expiration Date:   02/13/2024   CMP (Cancer Center only)    Standing Status:   Future    Standing Expiration Date:   02/13/2024   CBC with Differential (Cancer Center Only)    Standing Status:   Future    Standing Expiration Date:    02/20/2024   CMP (Cancer Center only)    Standing Status:   Future    Standing Expiration Date:   02/20/2024   CBC with Differential (Cancer Center Only)    Standing Status:   Future    Standing Expiration Date:   02/28/2024   CMP (Cancer Center only)    Standing Status:   Future    Standing Expiration Date:   02/28/2024      All questions were answered. The patient knows to call the clinic with any problems, questions or concerns. No barriers to learning were detected.    Alyssa Glad, Alyssa Dudley 02/06/2023  Addendum  I have seen the patient, examined her. I agree with the assessment and and plan and have edited the notes.   Alyssa Dudley has recovered well from bone marrow transplant.  I reviewed her outside medical records.  I agree with one more cycle of DVR before moving to maintenance therapy.  Will get her scheduled to start daratumumab and Velcade injection next week.  Will call in Revlimid.  We discussed Revlimid maintenance, and multiple myeloma surveillance.  All questions were answered.  Alyssa Mood MD 02/06/2023

## 2023-02-07 ENCOUNTER — Telehealth: Payer: Self-pay | Admitting: *Deleted

## 2023-02-07 ENCOUNTER — Encounter: Payer: Self-pay | Admitting: Hematology

## 2023-02-07 LAB — KAPPA/LAMBDA LIGHT CHAINS
Kappa free light chain: 10.3 mg/L (ref 3.3–19.4)
Kappa, lambda light chain ratio: 0.8 (ref 0.26–1.65)
Lambda free light chains: 12.8 mg/L (ref 5.7–26.3)

## 2023-02-07 NOTE — Telephone Encounter (Signed)
Connected with Marielena Sakuma, [phone]) regarding page of Combined Insurance Claim (section D) received.  Asked for other pages and advised of [place] Authorization for Use/Disclosure signed by patient to release PHI to third parties.  "The other sections are for the claimant and employer.  The information is for me to pick up.  Will sign the release and whatever is necessary when form is completed and ready for pick up."

## 2023-02-11 ENCOUNTER — Ambulatory Visit (INDEPENDENT_AMBULATORY_CARE_PROVIDER_SITE_OTHER): Payer: Medicare Other

## 2023-02-11 DIAGNOSIS — I441 Atrioventricular block, second degree: Secondary | ICD-10-CM

## 2023-02-11 LAB — MULTIPLE MYELOMA PANEL, SERUM
Albumin SerPl Elph-Mcnc: 3.9 g/dL (ref 2.9–4.4)
Albumin/Glob SerPl: 1.4 (ref 0.7–1.7)
Alpha 1: 0.2 g/dL (ref 0.0–0.4)
Alpha2 Glob SerPl Elph-Mcnc: 0.7 g/dL (ref 0.4–1.0)
B-Globulin SerPl Elph-Mcnc: 0.9 g/dL (ref 0.7–1.3)
Gamma Glob SerPl Elph-Mcnc: 1 g/dL (ref 0.4–1.8)
Globulin, Total: 2.8 g/dL (ref 2.2–3.9)
IgA: 10 mg/dL — ABNORMAL LOW (ref 87–352)
IgG (Immunoglobin G), Serum: 1059 mg/dL (ref 586–1602)
IgM (Immunoglobulin M), Srm: 32 mg/dL (ref 26–217)
Total Protein ELP: 6.7 g/dL (ref 6.0–8.5)

## 2023-02-12 ENCOUNTER — Encounter: Payer: Self-pay | Admitting: Hematology

## 2023-02-12 ENCOUNTER — Other Ambulatory Visit: Payer: Self-pay

## 2023-02-12 LAB — CUP PACEART REMOTE DEVICE CHECK
Battery Remaining Longevity: 6 mo
Battery Remaining Percentage: 4 %
Battery Voltage: 2.72 V
Brady Statistic AP VP Percent: 23 %
Brady Statistic AP VS Percent: 1 %
Brady Statistic AS VP Percent: 77 %
Brady Statistic AS VS Percent: 1 %
Brady Statistic RA Percent Paced: 23 %
Brady Statistic RV Percent Paced: 99 %
Date Time Interrogation Session: 20240506161454
Implantable Lead Connection Status: 753985
Implantable Lead Connection Status: 753985
Implantable Lead Implant Date: 20140408
Implantable Lead Implant Date: 20140408
Implantable Lead Location: 753859
Implantable Lead Location: 753860
Implantable Lead Model: 1944
Implantable Lead Model: 1948
Implantable Pulse Generator Implant Date: 20140408
Lead Channel Impedance Value: 550 Ohm
Lead Channel Impedance Value: 680 Ohm
Lead Channel Pacing Threshold Amplitude: 0.5 V
Lead Channel Pacing Threshold Amplitude: 0.75 V
Lead Channel Pacing Threshold Pulse Width: 0.4 ms
Lead Channel Pacing Threshold Pulse Width: 0.4 ms
Lead Channel Sensing Intrinsic Amplitude: 12 mV
Lead Channel Sensing Intrinsic Amplitude: 4.4 mV
Lead Channel Setting Pacing Amplitude: 1 V
Lead Channel Setting Pacing Amplitude: 1.5 V
Lead Channel Setting Pacing Pulse Width: 0.4 ms
Lead Channel Setting Sensing Sensitivity: 2 mV
Pulse Gen Model: 2210
Pulse Gen Serial Number: 7457437

## 2023-02-14 ENCOUNTER — Other Ambulatory Visit: Payer: Self-pay

## 2023-02-14 ENCOUNTER — Inpatient Hospital Stay: Payer: Medicare Other

## 2023-02-14 VITALS — BP 159/82 | HR 69 | Temp 99.3°F | Resp 16 | Wt 190.0 lb

## 2023-02-14 DIAGNOSIS — D649 Anemia, unspecified: Secondary | ICD-10-CM

## 2023-02-14 DIAGNOSIS — C9000 Multiple myeloma not having achieved remission: Secondary | ICD-10-CM

## 2023-02-14 DIAGNOSIS — Z5112 Encounter for antineoplastic immunotherapy: Secondary | ICD-10-CM | POA: Diagnosis not present

## 2023-02-14 LAB — CMP (CANCER CENTER ONLY)
ALT: 19 U/L (ref 0–44)
AST: 19 U/L (ref 15–41)
Albumin: 4.2 g/dL (ref 3.5–5.0)
Alkaline Phosphatase: 113 U/L (ref 38–126)
Anion gap: 7 (ref 5–15)
BUN: 12 mg/dL (ref 8–23)
CO2: 29 mmol/L (ref 22–32)
Calcium: 9.3 mg/dL (ref 8.9–10.3)
Chloride: 107 mmol/L (ref 98–111)
Creatinine: 0.71 mg/dL (ref 0.44–1.00)
GFR, Estimated: >60 mL/min (ref >60)
Glucose, Bld: 100 mg/dL — ABNORMAL HIGH (ref 70–99)
Potassium: 4.2 mmol/L (ref 3.5–5.1)
Sodium: 143 mmol/L (ref 135–145)
Total Bilirubin: 0.6 mg/dL (ref 0.3–1.2)
Total Protein: 7 g/dL (ref 6.5–8.1)

## 2023-02-14 LAB — CBC WITH DIFFERENTIAL (CANCER CENTER ONLY)
Abs Immature Granulocytes: 0 10*3/uL (ref 0.00–0.07)
Basophils Absolute: 0 10*3/uL (ref 0.0–0.1)
Basophils Relative: 1 %
Eosinophils Absolute: 0.1 10*3/uL (ref 0.0–0.5)
Eosinophils Relative: 2 %
HCT: 37.5 % (ref 36.0–46.0)
Hemoglobin: 12.4 g/dL (ref 12.0–15.0)
Immature Granulocytes: 0 %
Lymphocytes Relative: 40 %
Lymphs Abs: 1.4 10*3/uL (ref 0.7–4.0)
MCH: 31.4 pg (ref 26.0–34.0)
MCHC: 33.1 g/dL (ref 30.0–36.0)
MCV: 94.9 fL (ref 80.0–100.0)
Monocytes Absolute: 0.3 10*3/uL (ref 0.1–1.0)
Monocytes Relative: 8 %
Neutro Abs: 1.7 10*3/uL (ref 1.7–7.7)
Neutrophils Relative %: 49 %
Platelet Count: 183 10*3/uL (ref 150–400)
RBC: 3.95 MIL/uL (ref 3.87–5.11)
RDW: 13.2 % (ref 11.5–15.5)
WBC Count: 3.5 10*3/uL — ABNORMAL LOW (ref 4.0–10.5)
nRBC: 0 % (ref 0.0–0.2)

## 2023-02-14 MED ORDER — DARATUMUMAB-HYALURONIDASE-FIHJ 1800-30000 MG-UT/15ML ~~LOC~~ SOLN
1800.0000 mg | Freq: Once | SUBCUTANEOUS | Status: AC
  Administered 2023-02-14: 1800 mg via SUBCUTANEOUS
  Filled 2023-02-14: qty 15

## 2023-02-14 MED ORDER — BORTEZOMIB CHEMO SQ INJECTION 3.5 MG (2.5MG/ML)
1.3000 mg/m2 | Freq: Once | INTRAMUSCULAR | Status: AC
  Administered 2023-02-14: 2.5 mg via SUBCUTANEOUS
  Filled 2023-02-14: qty 1

## 2023-02-14 NOTE — Progress Notes (Signed)
Patient took premeds at home prior to treatment.

## 2023-02-15 LAB — KAPPA/LAMBDA LIGHT CHAINS
Kappa free light chain: 12.3 mg/L (ref 3.3–19.4)
Kappa, lambda light chain ratio: 0.69 (ref 0.26–1.65)
Lambda free light chains: 17.8 mg/L (ref 5.7–26.3)

## 2023-02-19 LAB — MULTIPLE MYELOMA PANEL, SERUM
Albumin SerPl Elph-Mcnc: 3.6 g/dL (ref 2.9–4.4)
Albumin/Glob SerPl: 1.3 (ref 0.7–1.7)
Alpha 1: 0.2 g/dL (ref 0.0–0.4)
Alpha2 Glob SerPl Elph-Mcnc: 0.8 g/dL (ref 0.4–1.0)
B-Globulin SerPl Elph-Mcnc: 0.9 g/dL (ref 0.7–1.3)
Gamma Glob SerPl Elph-Mcnc: 1 g/dL (ref 0.4–1.8)
Globulin, Total: 3 g/dL (ref 2.2–3.9)
IgA: 10 mg/dL — ABNORMAL LOW (ref 87–352)
IgG (Immunoglobin G), Serum: 1036 mg/dL (ref 586–1602)
IgM (Immunoglobulin M), Srm: 29 mg/dL (ref 26–217)
Total Protein ELP: 6.6 g/dL (ref 6.0–8.5)

## 2023-02-21 ENCOUNTER — Inpatient Hospital Stay: Payer: Medicare Other

## 2023-02-21 VITALS — BP 132/70 | HR 67 | Temp 98.1°F | Resp 17 | Wt 189.8 lb

## 2023-02-21 DIAGNOSIS — Z5112 Encounter for antineoplastic immunotherapy: Secondary | ICD-10-CM | POA: Diagnosis not present

## 2023-02-21 DIAGNOSIS — C9000 Multiple myeloma not having achieved remission: Secondary | ICD-10-CM

## 2023-02-21 DIAGNOSIS — D649 Anemia, unspecified: Secondary | ICD-10-CM

## 2023-02-21 LAB — CMP (CANCER CENTER ONLY)
ALT: 20 U/L (ref 0–44)
AST: 15 U/L (ref 15–41)
Albumin: 4.4 g/dL (ref 3.5–5.0)
Alkaline Phosphatase: 114 U/L (ref 38–126)
Anion gap: 6 (ref 5–15)
BUN: 10 mg/dL (ref 8–23)
CO2: 30 mmol/L (ref 22–32)
Calcium: 9.3 mg/dL (ref 8.9–10.3)
Chloride: 104 mmol/L (ref 98–111)
Creatinine: 0.83 mg/dL (ref 0.44–1.00)
GFR, Estimated: >60 mL/min (ref >60)
Glucose, Bld: 80 mg/dL (ref 70–99)
Potassium: 3.9 mmol/L (ref 3.5–5.1)
Sodium: 140 mmol/L (ref 135–145)
Total Bilirubin: 0.4 mg/dL (ref 0.3–1.2)
Total Protein: 7.1 g/dL (ref 6.5–8.1)

## 2023-02-21 LAB — CBC WITH DIFFERENTIAL (CANCER CENTER ONLY)
Abs Immature Granulocytes: 0.01 10*3/uL (ref 0.00–0.07)
Basophils Absolute: 0 10*3/uL (ref 0.0–0.1)
Basophils Relative: 0 %
Eosinophils Absolute: 0.1 10*3/uL (ref 0.0–0.5)
Eosinophils Relative: 4 %
HCT: 38.8 % (ref 36.0–46.0)
Hemoglobin: 12.9 g/dL (ref 12.0–15.0)
Immature Granulocytes: 0 %
Lymphocytes Relative: 32 %
Lymphs Abs: 1.1 10*3/uL (ref 0.7–4.0)
MCH: 31.2 pg (ref 26.0–34.0)
MCHC: 33.2 g/dL (ref 30.0–36.0)
MCV: 93.9 fL (ref 80.0–100.0)
Monocytes Absolute: 0.3 10*3/uL (ref 0.1–1.0)
Monocytes Relative: 10 %
Neutro Abs: 1.9 10*3/uL (ref 1.7–7.7)
Neutrophils Relative %: 54 %
Platelet Count: 165 10*3/uL (ref 150–400)
RBC: 4.13 MIL/uL (ref 3.87–5.11)
RDW: 13.2 % (ref 11.5–15.5)
WBC Count: 3.5 10*3/uL — ABNORMAL LOW (ref 4.0–10.5)
nRBC: 0 % (ref 0.0–0.2)

## 2023-02-21 MED ORDER — DARATUMUMAB-HYALURONIDASE-FIHJ 1800-30000 MG-UT/15ML ~~LOC~~ SOLN
1800.0000 mg | Freq: Once | SUBCUTANEOUS | Status: AC
  Administered 2023-02-21: 1800 mg via SUBCUTANEOUS
  Filled 2023-02-21: qty 15

## 2023-02-21 MED ORDER — BORTEZOMIB CHEMO SQ INJECTION 3.5 MG (2.5MG/ML)
1.3000 mg/m2 | Freq: Once | INTRAMUSCULAR | Status: AC
  Administered 2023-02-21: 2.5 mg via SUBCUTANEOUS
  Filled 2023-02-21: qty 1

## 2023-02-21 NOTE — Progress Notes (Signed)
Pt took dexamethasone, tylenol, and benadryl at home at 0715 this morning

## 2023-02-21 NOTE — Patient Instructions (Signed)
[facility]  Discharge Instructions: Thank you for choosing [facility] to provide your oncology and hematology care.   If you have a lab appointment with the Cancer Center, please go directly to the Cancer Center and check in at the registration area.   Wear comfortable clothing and clothing appropriate for easy access to any Portacath or PICC line.   We strive to give you quality time with your provider. You may need to reschedule your appointment if you arrive late (15 or more minutes).  Arriving late affects you and other patients whose appointments are after yours.  Also, if you miss three or more appointments without notifying the office, you may be dismissed from the clinic at the provider's discretion.      For prescription refill requests, have your pharmacy contact our office and allow 72 hours for refills to be completed.    Today you received the following chemotherapy and/or immunotherapy agents Velcade and Darzalex-Faspro.   To help prevent nausea and vomiting after your treatment, we encourage you to take your nausea medication as directed.  BELOW ARE SYMPTOMS THAT SHOULD BE REPORTED IMMEDIATELY: *FEVER GREATER THAN 100.4 F (38 C) OR HIGHER *CHILLS OR SWEATING *NAUSEA AND VOMITING THAT IS NOT CONTROLLED WITH YOUR NAUSEA MEDICATION *UNUSUAL SHORTNESS OF BREATH *UNUSUAL BRUISING OR BLEEDING *URINARY PROBLEMS (pain or burning when urinating, or frequent urination) *BOWEL PROBLEMS (unusual diarrhea, constipation, pain near the anus) TENDERNESS IN MOUTH AND THROAT WITH OR WITHOUT PRESENCE OF ULCERS (sore throat, sores in mouth, or a toothache) UNUSUAL RASH, SWELLING OR PAIN  UNUSUAL VAGINAL DISCHARGE OR ITCHING   Items with * indicate a potential emergency and should be followed up as soon as possible or go to the Emergency Department if any problems should occur.  Please show the CHEMOTHERAPY ALERT CARD or IMMUNOTHERAPY  ALERT CARD at check-in to the Emergency Department and triage nurse.  Should you have questions after your visit or need to cancel or reschedule your appointment, please contact [facility]  Dept: [phone]  and follow the prompts.  Office hours are 8:00 a.m. to 4:30 p.m. Monday - Friday. Please note that voicemails left after 4:00 p.m. may not be returned until the following business day.  We are closed weekends and major holidays. You have access to a nurse at all times for urgent questions. Please call the main number to the clinic Dept: [phone] and follow the prompts.   For any non-urgent questions, you may also contact your provider using MyChart. We now offer e-Visits for anyone 18 and older to request care online for non-urgent symptoms. For details visit [email or web address].   Also download the MyChart app! Go to the app store, search "MyChart", open the app, select [place], and log in with your MyChart username and password.   

## 2023-02-22 LAB — KAPPA/LAMBDA LIGHT CHAINS
Kappa free light chain: 6.4 mg/L (ref 3.3–19.4)
Kappa, lambda light chain ratio: 0.75 (ref 0.26–1.65)
Lambda free light chains: 8.5 mg/L (ref 5.7–26.3)

## 2023-02-27 ENCOUNTER — Other Ambulatory Visit: Payer: Self-pay

## 2023-02-28 ENCOUNTER — Inpatient Hospital Stay: Payer: Medicare Other

## 2023-02-28 ENCOUNTER — Telehealth: Payer: Self-pay

## 2023-02-28 ENCOUNTER — Other Ambulatory Visit: Payer: Self-pay

## 2023-02-28 VITALS — BP 124/61 | HR 78 | Temp 98.2°F | Resp 18 | Wt 188.2 lb

## 2023-02-28 DIAGNOSIS — Z5112 Encounter for antineoplastic immunotherapy: Secondary | ICD-10-CM | POA: Diagnosis not present

## 2023-02-28 DIAGNOSIS — D649 Anemia, unspecified: Secondary | ICD-10-CM

## 2023-02-28 DIAGNOSIS — C9000 Multiple myeloma not having achieved remission: Secondary | ICD-10-CM

## 2023-02-28 LAB — CBC WITH DIFFERENTIAL (CANCER CENTER ONLY)
Abs Immature Granulocytes: 0.01 10*3/uL (ref 0.00–0.07)
Basophils Absolute: 0 10*3/uL (ref 0.0–0.1)
Basophils Relative: 1 %
Eosinophils Absolute: 0.1 10*3/uL (ref 0.0–0.5)
Eosinophils Relative: 4 %
HCT: 36.7 % (ref 36.0–46.0)
Hemoglobin: 12.3 g/dL (ref 12.0–15.0)
Immature Granulocytes: 0 %
Lymphocytes Relative: 31 %
Lymphs Abs: 1.1 10*3/uL (ref 0.7–4.0)
MCH: 31.4 pg (ref 26.0–34.0)
MCHC: 33.5 g/dL (ref 30.0–36.0)
MCV: 93.6 fL (ref 80.0–100.0)
Monocytes Absolute: 0.6 10*3/uL (ref 0.1–1.0)
Monocytes Relative: 17 %
Neutro Abs: 1.7 10*3/uL (ref 1.7–7.7)
Neutrophils Relative %: 47 %
Platelet Count: 169 10*3/uL (ref 150–400)
RBC: 3.92 MIL/uL (ref 3.87–5.11)
RDW: 13.5 % (ref 11.5–15.5)
WBC Count: 3.5 10*3/uL — ABNORMAL LOW (ref 4.0–10.5)
nRBC: 0 % (ref 0.0–0.2)

## 2023-02-28 LAB — MULTIPLE MYELOMA PANEL, SERUM
Albumin SerPl Elph-Mcnc: 3.7 g/dL (ref 2.9–4.4)
Albumin/Glob SerPl: 1.3 (ref 0.7–1.7)
Alpha 1: 0.2 g/dL (ref 0.0–0.4)
Alpha2 Glob SerPl Elph-Mcnc: 0.9 g/dL (ref 0.4–1.0)
B-Globulin SerPl Elph-Mcnc: 1 g/dL (ref 0.7–1.3)
Gamma Glob SerPl Elph-Mcnc: 0.8 g/dL (ref 0.4–1.8)
Globulin, Total: 2.9 g/dL (ref 2.2–3.9)
IgA: 7 mg/dL — ABNORMAL LOW (ref 87–352)
IgG (Immunoglobin G), Serum: 1038 mg/dL (ref 586–1602)
IgM (Immunoglobulin M), Srm: 23 mg/dL — ABNORMAL LOW (ref 26–217)
M Protein SerPl Elph-Mcnc: 0.2 g/dL — ABNORMAL HIGH
Total Protein ELP: 6.6 g/dL (ref 6.0–8.5)

## 2023-02-28 LAB — CMP (CANCER CENTER ONLY)
ALT: 19 U/L (ref 0–44)
AST: 15 U/L (ref 15–41)
Albumin: 4.2 g/dL (ref 3.5–5.0)
Alkaline Phosphatase: 94 U/L (ref 38–126)
Anion gap: 8 (ref 5–15)
BUN: 12 mg/dL (ref 8–23)
CO2: 26 mmol/L (ref 22–32)
Calcium: 9 mg/dL (ref 8.9–10.3)
Chloride: 108 mmol/L (ref 98–111)
Creatinine: 0.76 mg/dL (ref 0.44–1.00)
GFR, Estimated: >60 mL/min (ref >60)
Glucose, Bld: 82 mg/dL (ref 70–99)
Potassium: 3.7 mmol/L (ref 3.5–5.1)
Sodium: 142 mmol/L (ref 135–145)
Total Bilirubin: 0.5 mg/dL (ref 0.3–1.2)
Total Protein: 6.4 g/dL — ABNORMAL LOW (ref 6.5–8.1)

## 2023-02-28 MED ORDER — LENALIDOMIDE 10 MG PO CAPS: 10.0000 mg | ORAL_CAPSULE | Freq: Every day | ORAL | 0 refills | Status: DC

## 2023-02-28 MED ORDER — BORTEZOMIB CHEMO SQ INJECTION 3.5 MG (2.5MG/ML)
1.3000 mg/m2 | Freq: Once | INTRAMUSCULAR | Status: AC
  Administered 2023-02-28: 2.5 mg via SUBCUTANEOUS
  Filled 2023-02-28: qty 1

## 2023-02-28 MED ORDER — DARATUMUMAB-HYALURONIDASE-FIHJ 1800-30000 MG-UT/15ML ~~LOC~~ SOLN
1800.0000 mg | Freq: Once | SUBCUTANEOUS | Status: AC
  Administered 2023-02-28: 1800 mg via SUBCUTANEOUS
  Filled 2023-02-28: qty 15

## 2023-02-28 NOTE — Patient Instructions (Signed)
[facility]  Discharge Instructions: Thank you for choosing [facility] to provide your oncology and hematology care.   If you have a lab appointment with the Cancer Center, please go directly to the Cancer Center and check in at the registration area.   Wear comfortable clothing and clothing appropriate for easy access to any Portacath or PICC line.   We strive to give you quality time with your provider. You may need to reschedule your appointment if you arrive late (15 or more minutes).  Arriving late affects you and other patients whose appointments are after yours.  Also, if you miss three or more appointments without notifying the office, you may be dismissed from the clinic at the provider's discretion.      For prescription refill requests, have your pharmacy contact our office and allow 72 hours for refills to be completed.    Today you received the following chemotherapy and/or immunotherapy agents: Velcade/Darzalex Faspro      To help prevent nausea and vomiting after your treatment, we encourage you to take your nausea medication as directed.  BELOW ARE SYMPTOMS THAT SHOULD BE REPORTED IMMEDIATELY: *FEVER GREATER THAN 100.4 F (38 C) OR HIGHER *CHILLS OR SWEATING *NAUSEA AND VOMITING THAT IS NOT CONTROLLED WITH YOUR NAUSEA MEDICATION *UNUSUAL SHORTNESS OF BREATH *UNUSUAL BRUISING OR BLEEDING *URINARY PROBLEMS (pain or burning when urinating, or frequent urination) *BOWEL PROBLEMS (unusual diarrhea, constipation, pain near the anus) TENDERNESS IN MOUTH AND THROAT WITH OR WITHOUT PRESENCE OF ULCERS (sore throat, sores in mouth, or a toothache) UNUSUAL RASH, SWELLING OR PAIN  UNUSUAL VAGINAL DISCHARGE OR ITCHING   Items with * indicate a potential emergency and should be followed up as soon as possible or go to the Emergency Department if any problems should occur.  Please show the CHEMOTHERAPY ALERT CARD or IMMUNOTHERAPY ALERT  CARD at check-in to the Emergency Department and triage nurse.  Should you have questions after your visit or need to cancel or reschedule your appointment, please contact [facility]  Dept: [phone]  and follow the prompts.  Office hours are 8:00 a.m. to 4:30 p.m. Monday - Friday. Please note that voicemails left after 4:00 p.m. may not be returned until the following business day.  We are closed weekends and major holidays. You have access to a nurse at all times for urgent questions. Please call the main number to the clinic Dept: [phone] and follow the prompts.   For any non-urgent questions, you may also contact your provider using MyChart. We now offer e-Visits for anyone 18 and older to request care online for non-urgent symptoms. For details visit [email or web address].   Also download the MyChart app! Go to the app store, search "MyChart", open the app, select [place], and log in with your MyChart username and password.   

## 2023-02-28 NOTE — Telephone Encounter (Signed)
Called patient and relayed the message below as per Santiago Glad NP. Patient voiced full understanding.    Alyssa Samples, NP sent to [name], CMA [facility], I refilled her Revlimid, but changed to maintenance dose 10 mg daily 3 weeks on/1 week off which is different from the way she took it before. Could you let her know about the change, but not to start until she sees Dr. Mosetta Putt next week.  Thanks, Clayborn Heron

## 2023-02-28 NOTE — Addendum Note (Signed)
Addended by: Pollyann Samples on: 02/28/2023 10:40 AM   Modules accepted: Orders

## 2023-03-01 ENCOUNTER — Telehealth: Payer: Self-pay

## 2023-03-01 LAB — KAPPA/LAMBDA LIGHT CHAINS
Kappa free light chain: 6.7 mg/L (ref 3.3–19.4)
Kappa, lambda light chain ratio: 0.82 (ref 0.26–1.65)
Lambda free light chains: 8.2 mg/L (ref 5.7–26.3)

## 2023-03-01 NOTE — Telephone Encounter (Signed)
Notified patient of completion of Cancer Form. Copy of form placed for pick-up as requested by Patient.

## 2023-03-06 ENCOUNTER — Encounter: Payer: Self-pay | Admitting: Hematology

## 2023-03-07 ENCOUNTER — Encounter: Payer: Self-pay | Admitting: Hematology

## 2023-03-07 ENCOUNTER — Inpatient Hospital Stay (HOSPITAL_BASED_OUTPATIENT_CLINIC_OR_DEPARTMENT_OTHER): Payer: Medicare Other | Admitting: Hematology

## 2023-03-07 ENCOUNTER — Inpatient Hospital Stay: Payer: Medicare Other

## 2023-03-07 VITALS — BP 148/86 | HR 67 | Temp 98.7°F | Resp 15 | Ht 65.0 in | Wt 189.9 lb

## 2023-03-07 DIAGNOSIS — C9000 Multiple myeloma not having achieved remission: Secondary | ICD-10-CM | POA: Diagnosis not present

## 2023-03-07 DIAGNOSIS — C9001 Multiple myeloma in remission: Secondary | ICD-10-CM

## 2023-03-07 DIAGNOSIS — Z5112 Encounter for antineoplastic immunotherapy: Secondary | ICD-10-CM | POA: Diagnosis not present

## 2023-03-07 DIAGNOSIS — D649 Anemia, unspecified: Secondary | ICD-10-CM

## 2023-03-07 LAB — CBC WITH DIFFERENTIAL (CANCER CENTER ONLY)
Abs Immature Granulocytes: 0 10*3/uL (ref 0.00–0.07)
Basophils Absolute: 0 10*3/uL (ref 0.0–0.1)
Basophils Relative: 1 %
Eosinophils Absolute: 0.1 10*3/uL (ref 0.0–0.5)
Eosinophils Relative: 3 %
HCT: 37.3 % (ref 36.0–46.0)
Hemoglobin: 12.8 g/dL (ref 12.0–15.0)
Immature Granulocytes: 0 %
Lymphocytes Relative: 39 %
Lymphs Abs: 1.2 10*3/uL (ref 0.7–4.0)
MCH: 31.9 pg (ref 26.0–34.0)
MCHC: 34.3 g/dL (ref 30.0–36.0)
MCV: 93 fL (ref 80.0–100.0)
Monocytes Absolute: 0.6 10*3/uL (ref 0.1–1.0)
Monocytes Relative: 18 %
Neutro Abs: 1.2 10*3/uL — ABNORMAL LOW (ref 1.7–7.7)
Neutrophils Relative %: 39 %
Platelet Count: 185 10*3/uL (ref 150–400)
RBC: 4.01 MIL/uL (ref 3.87–5.11)
RDW: 14.1 % (ref 11.5–15.5)
WBC Count: 3 10*3/uL — ABNORMAL LOW (ref 4.0–10.5)
nRBC: 0 % (ref 0.0–0.2)

## 2023-03-07 LAB — COMPREHENSIVE METABOLIC PANEL
ALT: 19 U/L (ref 0–44)
AST: 15 U/L (ref 15–41)
Albumin: 4.4 g/dL (ref 3.5–5.0)
Alkaline Phosphatase: 102 U/L (ref 38–126)
Anion gap: 5 (ref 5–15)
BUN: 12 mg/dL (ref 8–23)
CO2: 29 mmol/L (ref 22–32)
Calcium: 9.4 mg/dL (ref 8.9–10.3)
Chloride: 107 mmol/L (ref 98–111)
Creatinine, Ser: 0.81 mg/dL (ref 0.44–1.00)
GFR, Estimated: >60 mL/min (ref >60)
Glucose, Bld: 86 mg/dL (ref 70–99)
Potassium: 4.4 mmol/L (ref 3.5–5.1)
Sodium: 141 mmol/L (ref 135–145)
Total Bilirubin: 0.5 mg/dL (ref 0.3–1.2)
Total Protein: 6.9 g/dL (ref 6.5–8.1)

## 2023-03-07 MED ORDER — ACYCLOVIR 400 MG PO TABS: 400.0000 mg | ORAL_TABLET | Freq: Two times a day (BID) | ORAL | 5 refills | Status: DC

## 2023-03-07 NOTE — Assessment & Plan Note (Signed)
IgG lambda type, stage II, high risk -Diagnosed with CRAB criteria and bone marrow bx 05/01/2022, pathology confirmed plasma cell myeloma (60% plasma cells). Cytogenics and FISH c/w standard risk multiple myeloma except the deletion of 1P and gain of 1 q. which is a higher risk feature.  She is being treated as high risk -S/p 5 cycles induction DaraVRd starting 05/15/22 followed by autologous stem cell transplant 10/12/22 by Dr. Rosaria Ferries. Post transplant PET and marrow showed no evidence of disease -she received one cycle of DaraVRd to complete a total of 6 cycles -will proceed with dara and revlimid maintenance, with daratumumab injection 1800 mg every month for 2 years and Revlimid 10 mg on days 1-21 every 28 days

## 2023-03-07 NOTE — Progress Notes (Signed)
[facility]   Telephone:([phone] Fax:([phone]   Clinic Follow up Note   Patient Care Team: Lucky Cowboy, MD as PCP - General (Internal Medicine) Mardella Layman, MD as Consulting Physician (Gastroenterology) Gelene Mink, OD as Referring Physician (Optometry) Pricilla Riffle, MD as Consulting Physician (Cardiology) Cindee Salt, MD as Consulting Physician (Orthopedic Surgery) Alyssa Levy, MD as Referring Physician (Hematology and Oncology) Malachy Mood, MD as Attending Physician (Hematology and Oncology)  Date of Service:  03/07/2023  CHIEF COMPLAINT: f/u of multiple myeloma   CURRENT THERAPY:  Maintenance daratumumab SQ q28d, starting March 18, 2023 Revlimid 10 mg daily on day 1-21, every 28 days, starting 6/10  ASSESSMENT:  Alyssa Dudley is a 67 y.o. female with   Multiple myeloma (HCC)  IgG lambda type, stage II, high risk -Diagnosed with CRAB criteria and bone marrow bx 05/01/2022, pathology confirmed plasma cell myeloma (60% plasma cells). Cytogenics and FISH c/w standard risk multiple myeloma except the deletion of 1P and gain of 1 q. which is a higher risk feature.  She is being treated as high risk -S/p 5 cycles induction DaraVRd starting 05/15/22 followed by autologous stem cell transplant 10/12/22 by Dr. Rosaria Ferries. Post transplant PET and marrow showed no evidence of disease -she received one cycle of DaraVRd to complete a total of 6 cycles -will proceed with dara and revlimid maintenance, with daratumumab injection 1800 mg every month for 2 years and Revlimid 10 mg on days 1-21 every 28 days -She is clinically doing well, lab reviewed, plan to start her maintenance daratumumab and Revlimid on March 18, 2023. -Will check her multiple myeloma labs every 2 months, I will see her every 2 to 3 months -She will follow-up with her transplant team for her vaccinations    PLAN: - lab reviewed -CMP-pending -I discuss revlimid maintenance and  the dara injection - Start Revlimid  and Dara injection on 6/10  - I refill acyclovir -monitor Multiple Myeloma Panel every 2 months -OV with second cycle treatment    SUMMARY OF ONCOLOGIC HISTORY: Oncology History Overview Note   Cancer Staging  Multiple myeloma (HCC) Staging form: Plasma Cell Myeloma and Plasma Cell Disorders, AJCC 8th Edition - Clinical stage from 05/01/2022: RISS Stage II (Beta-2-microglobulin (mg/L): 3.2, Albumin (g/dL): 3.6, ISS: Stage I, High-risk cytogenetics: Absent, LDH: Elevated) - Signed by Malachy Mood, MD on 06/03/2022 Stage prefix: Initial diagnosis Beta 2 microglobulin range (mg/L): Less than 3.5 Albumin range (g/dL): Greater than or equal to 3.5 Cytogenetics: 1p deletion     Multiple myeloma (HCC)  05/01/2022 Cancer Staging   Staging form: Plasma Cell Myeloma and Plasma Cell Disorders, AJCC 8th Edition - Clinical stage from 05/01/2022: RISS Stage II (Beta-2-microglobulin (mg/L): 3.2, Albumin (g/dL): 3.6, ISS: Stage I, High-risk cytogenetics: Absent, LDH: Elevated) - Signed by Malachy Mood, MD on 06/03/2022 Stage prefix: Initial diagnosis Beta 2 microglobulin range (mg/L): Less than 3.5 Albumin range (g/dL): Greater than or equal to 3.5 Cytogenetics: 1p deletion   05/08/2022 Initial Diagnosis   Multiple myeloma (HCC)   05/14/2022 - 02/28/2023 Chemotherapy   Patient is on Treatment Plan : MYELOMA NEWLY DIAGNOSED TRANSPLANT CANDIDATE DaraVRd (Daratumumab IV) q21d x 6 Cycles (Induction/Consolidation)     05/15/2022 - 05/28/2022 Chemotherapy   Patient is on Treatment Plan : MYELOMA NEWLY DIAGNOSED TRANSPLANT CANDIDATE DaraVRd (Daratumumab IV) q21d x 6 Cycles (Induction/Consolidation)     03/18/2023 -  Chemotherapy   Patient is on Treatment Plan : MYELOMA Daratumumab SQ q28d  INTERVAL HISTORY:  [name] is here for a follow up of multiple myeloma. She was last seen by NP Lacie on 02/06/2023. She presents to the clinic alone. Pt state that she is  clinically doing well.      All other systems were reviewed with the patient and are negative.  MEDICAL HISTORY:  Past Medical History:  Diagnosis Date   Allergy    once a year    Hyperlipidemia    borderline- no meds    Pacemaker    Paroxysmal atrial fibrillation (HCC) 6/15   detected on ppm interrogation   PONV (postoperative nausea and vomiting)    has vomitted in past   Second degree Mobitz II AV block    Vitamin D deficiency     SURGICAL HISTORY: Past Surgical History:  Procedure Laterality Date   BREAST CYST EXCISION  1980   COLONOSCOPY     EXTRACORPOREAL SHOCK WAVE LITHOTRIPSY Right 12/28/2019   Procedure: RIGHT EXTRACORPOREAL SHOCK WAVE LITHOTRIPSY (ESWL);  Surgeon: Heloise Purpura, MD;  Location: [facility];  Service: Urology;  Laterality: Right;   PERMANENT PACEMAKER INSERTION N/A 01/13/2013   SJM Accent DR RF implanted by Dr Johney Frame for mobitz II AV block   PLANTAR FASCIECTOMY     POLYPECTOMY     WRIST SURGERY Right 2011   removed cyst    I have reviewed the social history and family history with the patient and they are unchanged from previous note.  ALLERGIES:  has No Known Allergies.  MEDICATIONS:  Current Outpatient Medications  Medication Sig Dispense Refill   acyclovir (ZOVIRAX) 400 MG tablet Take 1 tablet (400 mg total) by mouth 2 (two) times daily. 60 tablet 5   aspirin 81 MG chewable tablet Chew by mouth daily.     atenolol (TENORMIN) 50 MG tablet TAKE 1 TABLET BY MOUTH DAILY FOR BLOOD PRESSURE 90 tablet 3   chlorhexidine (PERIDEX) 0.12 % solution 10 mLs as needed.     dexamethasone (DECADRON) 4 MG tablet Take 5 tablets (20 mg total) by mouth once a week. 1-2 hours before chemo injections weekly 15 tablet 0   lenalidomide (REVLIMID) 10 MG capsule Take 1 capsule (10 mg total) by mouth daily. Take 1 capsules (10 mg total) by mouth daily for 21 days with 7 days off for a 28 day cycle.  Siri Cole # [id number]     Date Obtained 02/28/2023  28 capsule 0   Multiple Vitamin (MULTIVITAMIN) tablet Take 1 tablet by mouth daily.     triamcinolone cream (KENALOG) 0.1 % APPLY EXTERNALLY TO THE AFFECTED AREA THREE TIMES DAILY AS NEEDED FOR RASH 30 g 2   Vitamin D, Ergocalciferol, (DRISDOL) 1.25 MG (50000 UNIT) CAPS capsule TAKE 1 CAPSULE BY MOUTH TWICE WEEKLY 180 capsule 0   Current Facility-Administered Medications  Medication Dose Route Frequency Provider Last Rate Last Admin   0.9 %  sodium chloride infusion  500 mL Intravenous Once Pyrtle, Carie Caddy, MD        PHYSICAL EXAMINATION: ECOG PERFORMANCE STATUS: 0 - Asymptomatic  Vitals:   03/07/23 0920  BP: (!) 148/86  Pulse: 67  Resp: 15  Temp: 98.7 F (37.1 C)  SpO2: 100%   Wt Readings from Last 3 Encounters:  03/07/23 189 lb 14.4 oz (86.1 kg)  02/28/23 188 lb 4 oz (85.4 kg)  02/21/23 189 lb 12 oz (86.1 kg)     GENERAL:alert, no distress and comfortable SKIN: skin color normal, no rashes or significant lesions EYES:  normal, Conjunctiva are pink and non-injected, sclera clear  NEURO: alert & oriented x 3 with fluent speech  LABORATORY DATA:  I have reviewed the data as listed    Latest Ref Rng & Units 03/07/2023    8:38 AM 02/28/2023    7:57 AM 02/21/2023    7:48 AM  CBC  WBC 4.0 - 10.5 K/uL 3.0  3.5  3.5   Hemoglobin 12.0 - 15.0 g/dL 16.1  09.6  04.5   Hematocrit 36.0 - 46.0 % 37.3  36.7  38.8   Platelets 150 - 400 K/uL 185  169  165         Latest Ref Rng & Units 03/07/2023    8:38 AM 02/28/2023    7:57 AM 02/21/2023    7:48 AM  CMP  Glucose 70 - 99 mg/dL 86  82  80   BUN 8 - 23 mg/dL 12  12  10    Creatinine 0.44 - 1.00 mg/dL 4.09  8.11  9.14   Sodium 135 - 145 mmol/L 141  142  140   Potassium 3.5 - 5.1 mmol/L 4.4  3.7  3.9   Chloride 98 - 111 mmol/L 107  108  104   CO2 22 - 32 mmol/L 29  26  30    Calcium 8.9 - 10.3 mg/dL 9.4  9.0  9.3   Total Protein 6.5 - 8.1 g/dL 6.9  6.4  7.1   Total Bilirubin 0.3 - 1.2 mg/dL 0.5  0.5  0.4   Alkaline Phos 38 - 126 U/L  102  94  114   AST 15 - 41 U/L 15  15  15    ALT 0 - 44 U/L 19  19  20        RADIOGRAPHIC STUDIES: I have personally reviewed the radiological images as listed and agreed with the findings in the report. No results found.    Orders Placed This Encounter  Procedures   Multiple Myeloma Panel (SPEP&IFE w/QIG)    Standing Status:   Standing    Number of Occurrences:   10    Standing Expiration Date:   03/06/2024   Kappa/lambda light chains    Standing Status:   Standing    Number of Occurrences:   10    Standing Expiration Date:   03/06/2024   CBC with Differential (Cancer Center Only)    Standing Status:   Future    Standing Expiration Date:   03/17/2024   CBC with Differential (Cancer Center Only)    Standing Status:   Future    Standing Expiration Date:   04/14/2024   CBC with Differential (Cancer Center Only)    Standing Status:   Future    Standing Expiration Date:   05/12/2024   CBC with Differential (Cancer Center Only)    Standing Status:   Future    Standing Expiration Date:   06/09/2024   All questions were answered. The patient knows to call the clinic with any problems, questions or concerns. No barriers to learning was detected. The total time spent in the appointment was 25 minutes.     Malachy Mood, MD 03/07/2023   Carolin Coy, CMA, am acting as scribe for Malachy Mood, MD.   I have reviewed the above documentation for accuracy and completeness, and I agree with the above.

## 2023-03-11 LAB — KAPPA/LAMBDA LIGHT CHAINS
Kappa free light chain: 3.8 mg/L (ref 3.3–19.4)
Kappa, lambda light chain ratio: 0.58 (ref 0.26–1.65)
Lambda free light chains: 6.5 mg/L (ref 5.7–26.3)

## 2023-03-11 NOTE — Progress Notes (Signed)
Pharmacist Chemotherapy Monitoring - Initial Assessment    Anticipated start date: 03/18/23   The following has been reviewed per standard work regarding the patient's treatment regimen: The patient's diagnosis, treatment plan and drug doses, and organ/hematologic function Lab orders and baseline tests specific to treatment regimen  The treatment plan start date, drug sequencing, and pre-medications Prior authorization status  Patient's documented medication list, including drug-drug interaction screen and prescriptions for anti-emetics and supportive care specific to the treatment regimen The drug concentrations, fluid compatibility, administration routes, and timing of the medications to be used The patient's access for treatment and lifetime cumulative dose history, if applicable  The patient's medication allergies and previous infusion related reactions, if applicable   Changes made to treatment plan:  N/A  Follow up needed:  N/A   Demetrius Charity, RPH, 03/11/2023  4:17 PM

## 2023-03-12 LAB — MULTIPLE MYELOMA PANEL, SERUM
Albumin SerPl Elph-Mcnc: 3.6 g/dL (ref 2.9–4.4)
Albumin/Glob SerPl: 1.6 (ref 0.7–1.7)
Alpha 1: 0.2 g/dL (ref 0.0–0.4)
Alpha2 Glob SerPl Elph-Mcnc: 0.7 g/dL (ref 0.4–1.0)
B-Globulin SerPl Elph-Mcnc: 0.9 g/dL (ref 0.7–1.3)
Gamma Glob SerPl Elph-Mcnc: 0.6 g/dL (ref 0.4–1.8)
Globulin, Total: 2.4 g/dL (ref 2.2–3.9)
IgA: 5 mg/dL — ABNORMAL LOW (ref 87–352)
IgG (Immunoglobin G), Serum: 756 mg/dL (ref 586–1602)
IgM (Immunoglobulin M), Srm: 19 mg/dL — ABNORMAL LOW (ref 26–217)
M Protein SerPl Elph-Mcnc: 0.2 g/dL — ABNORMAL HIGH
Total Protein ELP: 6 g/dL (ref 6.0–8.5)

## 2023-03-12 NOTE — Progress Notes (Signed)
Remote pacemaker transmission.   

## 2023-03-14 ENCOUNTER — Ambulatory Visit (INDEPENDENT_AMBULATORY_CARE_PROVIDER_SITE_OTHER): Payer: BC Managed Care – PPO

## 2023-03-14 DIAGNOSIS — I441 Atrioventricular block, second degree: Secondary | ICD-10-CM

## 2023-03-14 LAB — MULTIPLE MYELOMA PANEL, SERUM
Albumin SerPl Elph-Mcnc: 3.5 g/dL (ref 2.9–4.4)
Albumin/Glob SerPl: 1.4 (ref 0.7–1.7)
Alpha 1: 0.2 g/dL (ref 0.0–0.4)
Alpha2 Glob SerPl Elph-Mcnc: 0.8 g/dL (ref 0.4–1.0)
B-Globulin SerPl Elph-Mcnc: 1 g/dL (ref 0.7–1.3)
Gamma Glob SerPl Elph-Mcnc: 0.6 g/dL (ref 0.4–1.8)
Globulin, Total: 2.6 g/dL (ref 2.2–3.9)
IgA: <5 mg/dL — ABNORMAL LOW (ref 87–352)
IgG (Immunoglobin G), Serum: 769 mg/dL (ref 586–1602)
IgM (Immunoglobulin M), Srm: 20 mg/dL — ABNORMAL LOW (ref 26–217)
M Protein SerPl Elph-Mcnc: 0.1 g/dL — ABNORMAL HIGH
Total Protein ELP: 6.1 g/dL (ref 6.0–8.5)

## 2023-03-14 LAB — CUP PACEART REMOTE DEVICE CHECK
Battery Remaining Longevity: 5 mo
Battery Remaining Percentage: 3 %
Battery Voltage: 2.69 V
Brady Statistic AP VP Percent: 21 %
Brady Statistic AP VS Percent: 1 %
Brady Statistic AS VP Percent: 79 %
Brady Statistic AS VS Percent: 1 %
Brady Statistic RA Percent Paced: 20 %
Brady Statistic RV Percent Paced: 99 %
Date Time Interrogation Session: 20240606090359
Implantable Lead Connection Status: 753985
Implantable Lead Connection Status: 753985
Implantable Lead Implant Date: 20140408
Implantable Lead Implant Date: 20140408
Implantable Lead Location: 753859
Implantable Lead Location: 753860
Implantable Lead Model: 1944
Implantable Lead Model: 1948
Implantable Pulse Generator Implant Date: 20140408
Lead Channel Impedance Value: 490 Ohm
Lead Channel Impedance Value: 580 Ohm
Lead Channel Pacing Threshold Amplitude: 0.5 V
Lead Channel Pacing Threshold Amplitude: 0.75 V
Lead Channel Pacing Threshold Pulse Width: 0.4 ms
Lead Channel Pacing Threshold Pulse Width: 0.4 ms
Lead Channel Sensing Intrinsic Amplitude: 12 mV
Lead Channel Sensing Intrinsic Amplitude: 2.9 mV
Lead Channel Setting Pacing Amplitude: 1 V
Lead Channel Setting Pacing Amplitude: 1.5 V
Lead Channel Setting Pacing Pulse Width: 0.4 ms
Lead Channel Setting Sensing Sensitivity: 2 mV
Pulse Gen Model: 2210
Pulse Gen Serial Number: 7457437

## 2023-03-18 ENCOUNTER — Inpatient Hospital Stay: Payer: Medicare Other | Attending: Hematology

## 2023-03-18 ENCOUNTER — Other Ambulatory Visit: Payer: Self-pay | Admitting: Hematology

## 2023-03-18 ENCOUNTER — Inpatient Hospital Stay: Payer: Medicare Other

## 2023-03-18 ENCOUNTER — Other Ambulatory Visit: Payer: Self-pay

## 2023-03-18 VITALS — BP 156/89 | HR 60 | Temp 98.1°F | Resp 18 | Wt 188.4 lb

## 2023-03-18 DIAGNOSIS — C9001 Multiple myeloma in remission: Secondary | ICD-10-CM

## 2023-03-18 DIAGNOSIS — C9000 Multiple myeloma not having achieved remission: Secondary | ICD-10-CM | POA: Insufficient documentation

## 2023-03-18 DIAGNOSIS — D649 Anemia, unspecified: Secondary | ICD-10-CM

## 2023-03-18 DIAGNOSIS — Z5112 Encounter for antineoplastic immunotherapy: Secondary | ICD-10-CM | POA: Diagnosis present

## 2023-03-18 LAB — COMPREHENSIVE METABOLIC PANEL
ALT: 15 U/L (ref 0–44)
AST: 16 U/L (ref 15–41)
Albumin: 4.4 g/dL (ref 3.5–5.0)
Alkaline Phosphatase: 114 U/L (ref 38–126)
Anion gap: 5 (ref 5–15)
BUN: 9 mg/dL (ref 8–23)
CO2: 31 mmol/L (ref 22–32)
Calcium: 9.8 mg/dL (ref 8.9–10.3)
Chloride: 107 mmol/L (ref 98–111)
Creatinine, Ser: 0.68 mg/dL (ref 0.44–1.00)
GFR, Estimated: >60 mL/min (ref >60)
Glucose, Bld: 102 mg/dL — ABNORMAL HIGH (ref 70–99)
Potassium: 3.9 mmol/L (ref 3.5–5.1)
Sodium: 143 mmol/L (ref 135–145)
Total Bilirubin: 0.6 mg/dL (ref 0.3–1.2)
Total Protein: 7 g/dL (ref 6.5–8.1)

## 2023-03-18 LAB — CBC WITH DIFFERENTIAL (CANCER CENTER ONLY)
Abs Immature Granulocytes: 0.01 K/uL (ref 0.00–0.07)
Basophils Absolute: 0 K/uL (ref 0.0–0.1)
Basophils Relative: 1 %
Eosinophils Absolute: 0.1 K/uL (ref 0.0–0.5)
Eosinophils Relative: 2 %
HCT: 38.7 % (ref 36.0–46.0)
Hemoglobin: 12.8 g/dL (ref 12.0–15.0)
Immature Granulocytes: 0 %
Lymphocytes Relative: 35 %
Lymphs Abs: 0.9 K/uL (ref 0.7–4.0)
MCH: 30.5 pg (ref 26.0–34.0)
MCHC: 33.1 g/dL (ref 30.0–36.0)
MCV: 92.4 fL (ref 80.0–100.0)
Monocytes Absolute: 0.2 K/uL (ref 0.1–1.0)
Monocytes Relative: 9 %
Neutro Abs: 1.3 K/uL — ABNORMAL LOW (ref 1.7–7.7)
Neutrophils Relative %: 53 %
Platelet Count: 151 K/uL (ref 150–400)
RBC: 4.19 MIL/uL (ref 3.87–5.11)
RDW: 13.7 % (ref 11.5–15.5)
WBC Count: 2.4 K/uL — ABNORMAL LOW (ref 4.0–10.5)
nRBC: 0 % (ref 0.0–0.2)

## 2023-03-18 MED ORDER — DIPHENHYDRAMINE HCL 25 MG PO CAPS
50.0000 mg | ORAL_CAPSULE | Freq: Once | ORAL | Status: DC
Start: 2023-03-18 — End: 2023-03-18

## 2023-03-18 MED ORDER — DEXAMETHASONE 4 MG PO TABS: 20.0000 mg | ORAL_TABLET | ORAL | 0 refills | Status: DC

## 2023-03-18 MED ORDER — DEXAMETHASONE 4 MG PO TABS: 20.0000 mg | ORAL_TABLET | Freq: Once | ORAL | Status: DC

## 2023-03-18 MED ORDER — ACETAMINOPHEN 325 MG PO TABS
650.0000 mg | ORAL_TABLET | Freq: Once | ORAL | Status: DC
Start: 2023-03-18 — End: 2023-03-18

## 2023-03-18 MED ORDER — MONTELUKAST SODIUM 10 MG PO TABS
10.0000 mg | ORAL_TABLET | Freq: Once | ORAL | Status: DC
Start: 2023-03-18 — End: 2023-03-18

## 2023-03-18 MED ORDER — DARATUMUMAB-HYALURONIDASE-FIHJ 1800-30000 MG-UT/15ML ~~LOC~~ SOLN
1800.0000 mg | Freq: Once | SUBCUTANEOUS | Status: AC
  Administered 2023-03-18: 1800 mg via SUBCUTANEOUS
  Filled 2023-03-18: qty 15

## 2023-03-18 NOTE — Progress Notes (Signed)
Per Mosetta Putt MD, ok to treat with ANC 1.3. Additionally pt ok to premed with Dexamethazone only per pt preference. Pt took Decadron at home today prior to arriving for [facility].

## 2023-03-18 NOTE — Patient Instructions (Signed)
[facility]  Discharge Instructions: Thank you for choosing [facility] to provide your oncology and hematology care.   If you have a lab appointment with the Cancer Center, please go directly to the Cancer Center and check in at the registration area.   Wear comfortable clothing and clothing appropriate for easy access to any Portacath or PICC line.   We strive to give you quality time with your provider. You may need to reschedule your appointment if you arrive late (15 or more minutes).  Arriving late affects you and other patients whose appointments are after yours.  Also, if you miss three or more appointments without notifying the office, you may be dismissed from the clinic at the provider's discretion.      For prescription refill requests, have your pharmacy contact our office and allow 72 hours for refills to be completed.    Today you received the following chemotherapy and/or immunotherapy agents: Dara Faspro.    To help prevent nausea and vomiting after your treatment, we encourage you to take your nausea medication as directed.  BELOW ARE SYMPTOMS THAT SHOULD BE REPORTED IMMEDIATELY: *FEVER GREATER THAN 100.4 F (38 C) OR HIGHER *CHILLS OR SWEATING *NAUSEA AND VOMITING THAT IS NOT CONTROLLED WITH YOUR NAUSEA MEDICATION *UNUSUAL SHORTNESS OF BREATH *UNUSUAL BRUISING OR BLEEDING *URINARY PROBLEMS (pain or burning when urinating, or frequent urination) *BOWEL PROBLEMS (unusual diarrhea, constipation, pain near the anus) TENDERNESS IN MOUTH AND THROAT WITH OR WITHOUT PRESENCE OF ULCERS (sore throat, sores in mouth, or a toothache) UNUSUAL RASH, SWELLING OR PAIN  UNUSUAL VAGINAL DISCHARGE OR ITCHING   Items with * indicate a potential emergency and should be followed up as soon as possible or go to the Emergency Department if any problems should occur.  Please show the CHEMOTHERAPY ALERT CARD or IMMUNOTHERAPY ALERT CARD at  check-in to the Emergency Department and triage nurse.  Should you have questions after your visit or need to cancel or reschedule your appointment, please contact [facility]  Dept: [phone]  and follow the prompts.  Office hours are 8:00 a.m. to 4:30 p.m. Monday - Friday. Please note that voicemails left after 4:00 p.m. may not be returned until the following business day.  We are closed weekends and major holidays. You have access to a nurse at all times for urgent questions. Please call the main number to the clinic Dept: [phone] and follow the prompts.   For any non-urgent questions, you may also contact your provider using MyChart. We now offer e-Visits for anyone 18 and older to request care online for non-urgent symptoms. For details visit [email or web address].   Also download the MyChart app! Go to the app store, search "MyChart", open the app, select [facility], and log in with your MyChart username and password.   

## 2023-03-20 LAB — KAPPA/LAMBDA LIGHT CHAINS
Kappa free light chain: 4.3 mg/L (ref 3.3–19.4)
Kappa, lambda light chain ratio: 1 (ref 0.26–1.65)
Lambda free light chains: 4.3 mg/L — ABNORMAL LOW (ref 5.7–26.3)

## 2023-03-20 LAB — UPEP/UIFE/LIGHT CHAINS/TP, 24-HR UR
% BETA, Urine: 0 %
ALPHA 1 URINE: 0 %
Albumin, U: 100 %
Alpha 2, Urine: 0 %
Free Kappa Lt Chains,Ur: 2.66 mg/L (ref 1.17–86.46)
Free Kappa/Lambda Ratio: >3.86 (ref 1.83–14.26)
Free Lambda Lt Chains,Ur: <0.69 mg/L (ref 0.27–15.21)
GAMMA GLOBULIN URINE: 0 %
Total Protein, Urine-Ur/day: 89 mg/24 hr (ref 30–150)
Total Protein, Urine: 4.8 mg/dL
Total Volume: 1850

## 2023-03-25 LAB — MULTIPLE MYELOMA PANEL, SERUM
Albumin SerPl Elph-Mcnc: 3.6 g/dL (ref 2.9–4.4)
Albumin/Glob SerPl: 1.4 (ref 0.7–1.7)
Alpha 1: 0.2 g/dL (ref 0.0–0.4)
Alpha2 Glob SerPl Elph-Mcnc: 0.9 g/dL (ref 0.4–1.0)
B-Globulin SerPl Elph-Mcnc: 1 g/dL (ref 0.7–1.3)
Gamma Glob SerPl Elph-Mcnc: 0.6 g/dL (ref 0.4–1.8)
Globulin, Total: 2.7 g/dL (ref 2.2–3.9)
IgA: <5 mg/dL — ABNORMAL LOW (ref 87–352)
IgG (Immunoglobin G), Serum: 658 mg/dL (ref 586–1602)
IgM (Immunoglobulin M), Srm: 17 mg/dL — ABNORMAL LOW (ref 26–217)
M Protein SerPl Elph-Mcnc: 0.1 g/dL — ABNORMAL HIGH
Total Protein ELP: 6.3 g/dL (ref 6.0–8.5)

## 2023-04-02 ENCOUNTER — Encounter: Payer: Self-pay | Admitting: Hematology

## 2023-04-04 NOTE — Progress Notes (Signed)
Remote pacemaker transmission.   

## 2023-04-08 ENCOUNTER — Encounter: Payer: BLUE CROSS/BLUE SHIELD | Admitting: Nurse Practitioner

## 2023-04-09 ENCOUNTER — Other Ambulatory Visit: Payer: Self-pay

## 2023-04-09 MED ORDER — LENALIDOMIDE 10 MG PO CAPS: 10.0000 mg | ORAL_CAPSULE | Freq: Every day | ORAL | 0 refills | Status: DC

## 2023-04-12 ENCOUNTER — Other Ambulatory Visit: Payer: Self-pay

## 2023-04-14 NOTE — Assessment & Plan Note (Addendum)
IgG lambda type, stage II, high risk -Diagnosed with CRAB criteria and bone marrow bx 05/01/2022, pathology confirmed plasma cell myeloma (60% plasma cells). Cytogenics and FISH c/w standard risk multiple myeloma except the deletion of 1P and gain of 1 q. which is a higher risk feature.  She is being treated as high risk -S/p 5 cycles induction DaraVRd starting 05/15/22 followed by autologous stem cell transplant 10/12/22 by Dr. Rosaria Ferries. Post transplant PET and marrow showed no evidence of disease -she received one cycle of DaraVRd to complete a total of 6 cycles -will continue dara and revlimid maintenance, with daratumumab injection 1800 mg every month for 2 years and Revlimid 10 mg on days 1-21 every 28 days, she started on March 18, 2023. -Will check her multiple myeloma labs and f/u every 2 months -She will also follow-up with her transplant team

## 2023-04-15 ENCOUNTER — Inpatient Hospital Stay: Payer: Medicare Other

## 2023-04-15 ENCOUNTER — Ambulatory Visit (INDEPENDENT_AMBULATORY_CARE_PROVIDER_SITE_OTHER): Payer: Medicare Other

## 2023-04-15 ENCOUNTER — Inpatient Hospital Stay: Payer: Medicare Other | Attending: Hematology

## 2023-04-15 ENCOUNTER — Ambulatory Visit (HOSPITAL_COMMUNITY)
Admission: RE | Admit: 2023-04-15 | Discharge: 2023-04-15 | Disposition: A | Discharge status: Home / Self Care | Payer: Medicare Other | Source: Ambulatory Visit | Attending: Hematology | Admitting: Hematology

## 2023-04-15 ENCOUNTER — Other Ambulatory Visit: Payer: Self-pay

## 2023-04-15 ENCOUNTER — Inpatient Hospital Stay (HOSPITAL_BASED_OUTPATIENT_CLINIC_OR_DEPARTMENT_OTHER): Payer: Medicare Other | Admitting: Hematology

## 2023-04-15 ENCOUNTER — Encounter: Payer: Self-pay | Admitting: Hematology

## 2023-04-15 VITALS — BP 142/81 | HR 75 | Temp 98.7°F | Resp 18 | Ht 65.0 in | Wt 188.8 lb

## 2023-04-15 DIAGNOSIS — Z5112 Encounter for antineoplastic immunotherapy: Secondary | ICD-10-CM | POA: Insufficient documentation

## 2023-04-15 DIAGNOSIS — D649 Anemia, unspecified: Secondary | ICD-10-CM

## 2023-04-15 DIAGNOSIS — C9000 Multiple myeloma not having achieved remission: Secondary | ICD-10-CM | POA: Diagnosis present

## 2023-04-15 DIAGNOSIS — C9001 Multiple myeloma in remission: Secondary | ICD-10-CM | POA: Insufficient documentation

## 2023-04-15 DIAGNOSIS — I441 Atrioventricular block, second degree: Secondary | ICD-10-CM

## 2023-04-15 LAB — CUP PACEART REMOTE DEVICE CHECK
Battery Remaining Longevity: 3 mo
Battery Remaining Percentage: 2 %
Battery Voltage: 2.66 V
Brady Statistic AP VP Percent: 22 %
Brady Statistic AP VS Percent: 1 %
Brady Statistic AS VP Percent: 78 %
Brady Statistic AS VS Percent: 1 %
Brady Statistic RA Percent Paced: 22 %
Brady Statistic RV Percent Paced: 99 %
Date Time Interrogation Session: 20240708065956
Implantable Lead Connection Status: 753985
Implantable Lead Connection Status: 753985
Implantable Lead Implant Date: 20140408
Implantable Lead Implant Date: 20140408
Implantable Lead Location: 753859
Implantable Lead Location: 753860
Implantable Lead Model: 1944
Implantable Lead Model: 1948
Implantable Pulse Generator Implant Date: 20140408
Lead Channel Impedance Value: 490 Ohm
Lead Channel Impedance Value: 580 Ohm
Lead Channel Pacing Threshold Amplitude: 0.5 V
Lead Channel Pacing Threshold Amplitude: 0.875 V
Lead Channel Pacing Threshold Pulse Width: 0.4 ms
Lead Channel Pacing Threshold Pulse Width: 0.4 ms
Lead Channel Sensing Intrinsic Amplitude: 12 mV
Lead Channel Sensing Intrinsic Amplitude: 3.9 mV
Lead Channel Setting Pacing Amplitude: 1.125
Lead Channel Setting Pacing Amplitude: 1.5 V
Lead Channel Setting Pacing Pulse Width: 0.4 ms
Lead Channel Setting Sensing Sensitivity: 2 mV
Pulse Gen Model: 2210
Pulse Gen Serial Number: 7457437

## 2023-04-15 LAB — COMPREHENSIVE METABOLIC PANEL
ALT: 16 U/L (ref 0–44)
AST: 15 U/L (ref 15–41)
Albumin: 3.8 g/dL (ref 3.5–5.0)
Alkaline Phosphatase: 107 U/L (ref 38–126)
Anion gap: 7 (ref 5–15)
BUN: 11 mg/dL (ref 8–23)
CO2: 28 mmol/L (ref 22–32)
Calcium: 9.4 mg/dL (ref 8.9–10.3)
Chloride: 107 mmol/L (ref 98–111)
Creatinine, Ser: 0.78 mg/dL (ref 0.44–1.00)
GFR, Estimated: >60 mL/min (ref >60)
Glucose, Bld: 98 mg/dL (ref 70–99)
Potassium: 4.3 mmol/L (ref 3.5–5.1)
Sodium: 142 mmol/L (ref 135–145)
Total Bilirubin: 0.6 mg/dL (ref 0.3–1.2)
Total Protein: 6.7 g/dL (ref 6.5–8.1)

## 2023-04-15 LAB — CBC WITH DIFFERENTIAL (CANCER CENTER ONLY)
Abs Immature Granulocytes: 0.01 10*3/uL (ref 0.00–0.07)
Basophils Absolute: 0 10*3/uL (ref 0.0–0.1)
Basophils Relative: 1 %
Eosinophils Absolute: 0.1 10*3/uL (ref 0.0–0.5)
Eosinophils Relative: 2 %
HCT: 36.4 % (ref 36.0–46.0)
Hemoglobin: 12 g/dL (ref 12.0–15.0)
Immature Granulocytes: 0 %
Lymphocytes Relative: 44 %
Lymphs Abs: 1.4 10*3/uL (ref 0.7–4.0)
MCH: 30.1 pg (ref 26.0–34.0)
MCHC: 33 g/dL (ref 30.0–36.0)
MCV: 91.2 fL (ref 80.0–100.0)
Monocytes Absolute: 0.7 10*3/uL (ref 0.1–1.0)
Monocytes Relative: 23 %
Neutro Abs: 1 10*3/uL — ABNORMAL LOW (ref 1.7–7.7)
Neutrophils Relative %: 30 %
Platelet Count: 236 10*3/uL (ref 150–400)
RBC: 3.99 MIL/uL (ref 3.87–5.11)
RDW: 14.6 % (ref 11.5–15.5)
WBC Count: 3.3 10*3/uL — ABNORMAL LOW (ref 4.0–10.5)
nRBC: 0 % (ref 0.0–0.2)

## 2023-04-15 MED ORDER — DARATUMUMAB-HYALURONIDASE-FIHJ 1800-30000 MG-UT/15ML ~~LOC~~ SOLN
1800.0000 mg | Freq: Once | SUBCUTANEOUS | Status: AC
  Administered 2023-04-15: 1800 mg via SUBCUTANEOUS
  Filled 2023-04-15: qty 15

## 2023-04-15 NOTE — Progress Notes (Signed)
Okay to treat with ANC 1.0 per Dr. Mosetta Putt

## 2023-04-15 NOTE — Progress Notes (Signed)
[facility]   Telephone:(336) [phone] [phone]) [phone]   Clinic Follow up Note   Patient Care Team: Lucky Cowboy, MD as PCP - General (Internal Medicine) Mardella Layman, MD as Consulting Physician (Gastroenterology) Gelene Mink, OD as Referring Physician (Optometry) Pricilla Riffle, MD as Consulting Physician (Cardiology) Cindee Salt, MD as Consulting Physician (Orthopedic Surgery) Reece Levy, MD as Referring Physician (Hematology and Oncology) Malachy Mood, MD as Attending Physician (Hematology and Oncology)  Date of Service:  04/15/2023  CHIEF COMPLAINT: f/u of multiple myeloma   CURRENT THERAPY:  Maintenance daratumumab SQ q28d, starting March 18, 2023 Revlimid 10 mg daily on day 1-21, every 28 days, starting 6/10    ASSESSMENT:  Alyssa Dudley is a 67 y.o. female with   Multiple myeloma (HCC)  IgG lambda type, stage II, high risk -Diagnosed with CRAB criteria and bone marrow bx 05/01/2022, pathology confirmed plasma cell myeloma (60% plasma cells). Cytogenics and FISH c/w standard risk multiple myeloma except the deletion of 1P and gain of 1 q. which is a higher risk feature.  She is being treated as high risk -S/p 5 cycles induction DaraVRd starting 05/15/22 followed by autologous stem cell transplant 10/12/22 by Dr. Rosaria Ferries. Post transplant PET and marrow showed no evidence of disease -she received one cycle of DaraVRd to complete a total of 6 cycles -will continue dara and revlimid maintenance, with daratumumab injection 1800 mg every month for 2 years and Revlimid 10 mg on days 1-21 every 28 days, she started on March 18, 2023. -Will check her multiple myeloma labs and f/u every 2 months -She will also follow-up with her transplant team tomorrow at [facility]  URI -She developed a mild cough after sick contact with her cousin -No fever, chills, she has mild clear sputum production -Will check a chest x-ray to rule out pneumonia -Will hold on  antibiotics for now  PLAN: -lab reviewed -I order Chest xray to rule out pneumonia  -Continue Dara injection  and revlimid maintenance -lab/ flush  and f/u in 2 months  SUMMARY OF ONCOLOGIC HISTORY: Oncology History Overview Note   Cancer Staging  Multiple myeloma (HCC) Staging form: Plasma Cell Myeloma and Plasma Cell Disorders, AJCC 8th Edition - Clinical stage from 05/01/2022: RISS Stage II (Beta-2-microglobulin (mg/L): 3.2, Albumin (g/dL): 3.6, ISS: Stage I, High-risk cytogenetics: Absent, LDH: Elevated) - Signed by Malachy Mood, MD on 06/03/2022 Stage prefix: Initial diagnosis Beta 2 microglobulin range (mg/L): Less than 3.5 Albumin range (g/dL): Greater than or equal to 3.5 Cytogenetics: 1p deletion     Multiple myeloma (HCC)  05/01/2022 Cancer Staging   Staging form: Plasma Cell Myeloma and Plasma Cell Disorders, AJCC 8th Edition - Clinical stage from 05/01/2022: RISS Stage II (Beta-2-microglobulin (mg/L): 3.2, Albumin (g/dL): 3.6, ISS: Stage I, High-risk cytogenetics: Absent, LDH: Elevated) - Signed by Malachy Mood, MD on 06/03/2022 Stage prefix: Initial diagnosis Beta 2 microglobulin range (mg/L): Less than 3.5 Albumin range (g/dL): Greater than or equal to 3.5 Cytogenetics: 1p deletion   05/08/2022 Initial Diagnosis   Multiple myeloma (HCC)   05/14/2022 - 02/28/2023 Chemotherapy   Patient is on Treatment Plan : MYELOMA NEWLY DIAGNOSED TRANSPLANT CANDIDATE DaraVRd (Daratumumab IV) q21d x 6 Cycles (Induction/Consolidation)     05/15/2022 - 05/28/2022 Chemotherapy   Patient is on Treatment Plan : MYELOMA NEWLY DIAGNOSED TRANSPLANT CANDIDATE DaraVRd (Daratumumab IV) q21d x 6 Cycles (Induction/Consolidation)     03/18/2023 -  Chemotherapy   Patient is on Treatment Plan : MYELOMA Daratumumab SQ  q28d        INTERVAL HISTORY:  Alyssa Dudley is here for a follow up of multiple myeloma. She was last seen by me on 03/07/2023. She presents to the clinic alone. Pt state that she has been  congested with no fever and no soar throat. Pt took Covid test and she was negative. Pt state that she has some discomfort in her lower right back.   All other systems were reviewed with the patient and are negative.  MEDICAL HISTORY:  Past Medical History:  Diagnosis Date   Allergy    once a year    Hyperlipidemia    borderline- no meds    Pacemaker    Paroxysmal atrial fibrillation (HCC) 6/15   detected on ppm interrogation   PONV (postoperative nausea and vomiting)    has vomitted in past   Second degree Mobitz II AV block    Vitamin D deficiency     SURGICAL HISTORY: Past Surgical History:  Procedure Laterality Date   BREAST CYST EXCISION  1980   COLONOSCOPY     EXTRACORPOREAL SHOCK WAVE LITHOTRIPSY Right 12/28/2019   Procedure: RIGHT EXTRACORPOREAL SHOCK WAVE LITHOTRIPSY (ESWL);  Surgeon: Heloise Purpura, MD;  Location: [facility];  Service: Urology;  Laterality: Right;   PERMANENT PACEMAKER INSERTION N/A 01/13/2013   SJM Accent DR RF implanted by Dr Johney Frame for mobitz II AV block   PLANTAR FASCIECTOMY     POLYPECTOMY     WRIST SURGERY Right 2011   removed cyst    I have reviewed the social history and family history with the patient and they are unchanged from previous note.  ALLERGIES:  has No Known Allergies.  MEDICATIONS:  Current Outpatient Medications  Medication Sig Dispense Refill   acyclovir (ZOVIRAX) 400 MG tablet Take 1 tablet (400 mg total) by mouth 2 (two) times daily. 60 tablet 5   aspirin 81 MG chewable tablet Chew by mouth daily.     atenolol (TENORMIN) 50 MG tablet TAKE 1 TABLET BY MOUTH DAILY FOR BLOOD PRESSURE 90 tablet 3   chlorhexidine (PERIDEX) 0.12 % solution 10 mLs as needed.     dexamethasone (DECADRON) 4 MG tablet Take 5 tablets (20 mg total) by mouth every 30 (thirty) days. 1-2 hours before chemo injections every 4 weeks 15 tablet 0   lenalidomide (REVLIMID) 10 MG capsule Take 1 capsule (10 mg total) by mouth daily. Take 1  capsules (10 mg total) by mouth daily for 21 days with 7 days off for a 28 day cycle.  Siri Cole # [id number]     Date Obtained 04/09/2023 28 capsule 0   Multiple Vitamin (MULTIVITAMIN) tablet Take 1 tablet by mouth daily.     triamcinolone cream (KENALOG) 0.1 % APPLY EXTERNALLY TO THE AFFECTED AREA THREE TIMES DAILY AS NEEDED FOR RASH 30 g 2   Vitamin D, Ergocalciferol, (DRISDOL) 1.25 MG (50000 UNIT) CAPS capsule TAKE 1 CAPSULE BY MOUTH TWICE WEEKLY 180 capsule 0   Current Facility-Administered Medications  Medication Dose Route Frequency Provider Last Rate Last Admin   0.9 %  sodium chloride infusion  500 mL Intravenous Once Pyrtle, Carie Caddy, MD       Facility-Administered Medications Ordered in Other Visits  Medication Dose Route Frequency Provider Last Rate Last Admin   daratumumab-hyaluronidase-fihj (DARZALEX FASPRO) 1800-30000 MG-UT/15ML chemo SQ injection 1,800 mg  1,800 mg Subcutaneous Once Malachy Mood, MD        PHYSICAL EXAMINATION: ECOG PERFORMANCE STATUS: 0 - Asymptomatic  Vitals:   04/15/23 0821  BP: (!) 142/81  Pulse: 75  Resp: 18  Temp: 98.7 F (37.1 C)  SpO2: 100%   Wt Readings from Last 3 Encounters:  04/15/23 188 lb 12.8 oz (85.6 kg)  03/18/23 188 lb 6.4 oz (85.5 kg)  03/07/23 189 lb 14.4 oz (86.1 kg)     GENERAL:alert, no distress and comfortable SKIN: skin color normal, no rashes or significant lesions EYES: normal, Conjunctiva are pink and non-injected, sclera clear  NEURO: alert & oriented x 3 with fluent speech NECK: (-) supple, thyroid normal size, non-tender, without nodularity LYMPH:  (-) no palpable lymphadenopathy in the cervical, axillary  LUNGS: (+) clear to auscultation and percussion with normal breathing effort HEART: (-) regular rate & rhythm and no murmurs and (-)no lower extremity edema  LABORATORY DATA:  I have reviewed the data as listed    Latest Ref Rng & Units 04/15/2023    7:43 AM 03/18/2023    8:32 AM 03/07/2023    8:38 AM  CBC  WBC  4.0 - 10.5 K/uL 3.3  2.4  3.0   Hemoglobin 12.0 - 15.0 g/dL 16.1  09.6  04.5   Hematocrit 36.0 - 46.0 % 36.4  38.7  37.3   Platelets 150 - 400 K/uL 236  151  185         Latest Ref Rng & Units 04/15/2023    7:43 AM 03/18/2023    8:32 AM 03/07/2023    8:38 AM  CMP  Glucose 70 - 99 mg/dL 98  409  86   BUN 8 - 23 mg/dL 11  9  12    Creatinine 0.44 - 1.00 mg/dL 8.11  9.14  7.82   Sodium 135 - 145 mmol/L 142  143  141   Potassium 3.5 - 5.1 mmol/L 4.3  3.9  4.4   Chloride 98 - 111 mmol/L 107  107  107   CO2 22 - 32 mmol/L 28  31  29    Calcium 8.9 - 10.3 mg/dL 9.4  9.8  9.4   Total Protein 6.5 - 8.1 g/dL 6.7  7.0  6.9   Total Bilirubin 0.3 - 1.2 mg/dL 0.6  0.6  0.5   Alkaline Phos 38 - 126 U/L 107  114  102   AST 15 - 41 U/L 15  16  15    ALT 0 - 44 U/L 16  15  19        RADIOGRAPHIC STUDIES: I have personally reviewed the radiological images as listed and agreed with the findings in the report. No results found.    Orders Placed This Encounter  Procedures   DG Chest 2 View    Standing Status:   Future    Standing Expiration Date:   04/14/2024    Order Specific Question:   Reason for Exam (SYMPTOM  OR DIAGNOSIS REQUIRED)    Answer:   cough, rule out pulmonary infection    Order Specific Question:   Preferred imaging location?    Answer:   [facility]   All questions were answered. The patient knows to call the clinic with any problems, questions or concerns. No barriers to learning was detected. The total time spent in the appointment was 25 minutes.     Malachy Mood, MD 04/15/2023   Carolin Coy, CMA, am acting as scribe for Malachy Mood, MD.   I have reviewed the above documentation for accuracy and completeness, and I agree with the above.

## 2023-04-16 NOTE — Progress Notes (Unsigned)
Complete Physical  Assessment and Plan:  Encounter for routine adult health examination with abnormal findings 67 years Mammogram, DEXA, colonoscopy are all UTD  Mobitz type 2 second degree and high-grade heart block Pacemaker in place, continue follow up with Dr. Johney Frame Last echo 01/2021 and was looking ok, followed by cardiology  Essential hypertension Continue medication Monitor blood pressure at home; call if consistently over 130/80 Continue DASH diet.   Reminder to go to the ER if any CP, SOB, nausea, dizziness, severe HA, changes vision/speech, left arm numbness and tingling and jaw pain. -UA, microalbumin/creatinine urine ratio   Paroxysmal atrial fibrillation (HCC) CHADSASC is now 2 since she is being treated for HTN- not on anticoag Followed by cardiology- last seen 12/2021 , still no a/c needed  Gastroesophageal reflux disease, esophagitis presence not specified Well managed on current medications; PRN OTC agents Discussed diet, avoiding triggers and other lifestyle changes  Benign neoplasm of colon, unspecified part of colon Colonoscopy 07/2021, next due 2027  Vitamin D deficiency Continue supplementation for goal of 70-100 Check vitamin D level  Overweight (BMI 25.0-29.9) Long discussion about weight loss, diet, and exercise Recommended diet heavy in fruits and veggies and low in animal meats, cheeses, and dairy products, appropriate calorie intake Patient will work on cutting down on sugary snacks - pt down 1 pound  Medication management CBC, CMP/GFR, UA, magnesium  Vasomotor symptoms due to menopause - Try estroven complete over the counter for hot flashes, vaginal dryness and decreased libido If symptoms are not improving notify the office Work on relaxation techniques  Pure hypercholesterolemia Currently at goal by lifestyle modification Continue low cholesterol diet and exercise.  Check lipid panel.   History of peptic ulcer disease Avoid  NSAIDS  Abnormal glucose Discussed diet/exercise, weight management  A1C  Osteoarthritis -Does exercises and Tylenol as needed  Screening for hematuria/proteinuria   Screening for ischemic heart disease - EKG    Discupt had sore throat for approx week but had salivary stone she removed and has resolvedssed med's effects and SE's. Screening labs and tests as requested with regular follow-up as recommended. Over 60 minutes of exam, counseling, chart review and critical decision making was performed  Future Appointments  Date Time Provider Department Center  04/17/2023  9:00 AM Raynelle Dick, NP [facility] None  04/25/2023  8:30 AM Lanier Prude, MD [facility]  05/13/2023  7:45 AM [facility] LAB [facility] None  05/13/2023  8:30 AM [facility] INFUSION [facility] None  05/16/2023  7:05 AM [facility] DEVICE REMOTES [facility]  06/12/2023  7:45 AM [facility] LAB [facility] None  06/12/2023  8:20 AM Malachy Mood, MD [facility] None  06/12/2023  9:15 AM [facility] INFUSION [facility] None  06/17/2023  7:10 AM [facility] DEVICE REMOTES [facility]  07/18/2023  7:05 AM [facility] DEVICE REMOTES [facility]  08/19/2023  7:05 AM [facility] DEVICE REMOTES [facility]  09/19/2023  7:05 AM [facility] DEVICE REMOTES [facility]  10/21/2023  7:05 AM [facility] DEVICE REMOTES [facility]     HPI  This very nice 67 y.o.female presents for complete physical. has GERD; Constipation; History of peptic ulcer disease; Family history of malignant neoplasm of gastrointestinal tract; Benign neoplasm of colon; Mobitz type 2 second degree and high-grade heart block; Hyperlipidemia; Vitamin D deficiency; Atrial fibrillation (HCC); HTN (hypertension); Abnormal glucose; Medication management; Overweight (BMI 25.0-29.9); Meralgia paraesthetica, left; Plantar fasciitis of left foot; Lateral epicondylitis of left  elbow; Osteoarthritis of multiple joints; Salivary stone; History of renal calculi; and Multiple myeloma (  HCC) on their problem list.   She has Afib and mobitz type 2 with pacemaker placement in 2014, follows with Dr. Johney Frame and Uvaldo Rising, low risk CHADSVASC 1 and not on anticoagulation. Patient reports no complaints at this time. Last EKG 01/2021 Stable.  Reflux symptoms are not bad currently and is controlled with behavior modifications  She has noted persistent hot flashes, vaginal dryness and decreased libido.  Has only been married x 2 years and husband is 7 years younger- is concerned.  She continues to have pain in right shoulder from arthritis, previously injected. She is not on any medication  BMI is There is no height or weight on file to calculate BMI., she has been working on diet and exercise. Has cut out sweets, sweet drinks and chips Wt Readings from Last 3 Encounters:  04/15/23 188 lb 12.8 oz (85.6 kg)  03/18/23 188 lb 6.4 oz (85.5 kg)  03/07/23 189 lb 14.4 oz (86.1 kg)   Her blood pressure has been controlled at home, currently on Atenolol 50 mg daily, today their BP is  ,  BP Readings from Last 3 Encounters:  04/15/23 (!) 142/81  03/18/23 (!) 156/89  03/07/23 (!) 148/86   She does workout. She denies chest pain, shortness of breath, dizziness.    She is not on cholesterol medication, she tried zetia but states she had diarrhea with taking only 3 x a week. . Her cholesterol is not at goal. The cholesterol last visit was:   Lab Results  Component Value Date   CHOL 218 (H) 09/26/2022   HDL 50 09/26/2022   LDLCALC 136 (H) 09/26/2022   TRIG 185 (H) 09/26/2022   CHOLHDL 4.4 09/26/2022    She has been working on diet and exercise for glucose management, and denies foot ulcerations, increased appetite, nausea, paresthesia of the feet, polydipsia, polyuria, visual disturbances and vomiting. Last A1C in the office was:  Lab Results  Component Value Date   HGBA1C 5.6 04/05/2022    Patient is on Vitamin D supplement, 1 capsule twice a week Lab Results  Component Value Date   VD25OH 68 09/26/2022       Current Medications:  Current Outpatient Medications on File Prior to Visit  Medication Sig Dispense Refill   acyclovir (ZOVIRAX) 400 MG tablet Take 1 tablet (400 mg total) by mouth 2 (two) times daily. 60 tablet 5   aspirin 81 MG chewable tablet Chew by mouth daily.     atenolol (TENORMIN) 50 MG tablet TAKE 1 TABLET BY MOUTH DAILY FOR BLOOD PRESSURE 90 tablet 3   chlorhexidine (PERIDEX) 0.12 % solution 10 mLs as needed.     dexamethasone (DECADRON) 4 MG tablet Take 5 tablets (20 mg total) by mouth every 30 (thirty) days. 1-2 hours before chemo injections every 4 weeks 15 tablet 0   lenalidomide (REVLIMID) 10 MG capsule Take 1 capsule (10 mg total) by mouth daily. Take 1 capsules (10 mg total) by mouth daily for 21 days with 7 days off for a 28 day cycle.  Siri Cole # [id number]     Date Obtained 04/09/2023 28 capsule 0   Multiple Vitamin (MULTIVITAMIN) tablet Take 1 tablet by mouth daily.     triamcinolone cream (KENALOG) 0.1 % APPLY EXTERNALLY TO THE AFFECTED AREA THREE TIMES DAILY AS NEEDED FOR RASH 30 g 2   Vitamin D, Ergocalciferol, (DRISDOL) 1.25 MG (50000 UNIT) CAPS capsule TAKE 1 CAPSULE BY MOUTH TWICE WEEKLY 180 capsule 0   Current Facility-Administered Medications on  File Prior to Visit  Medication Dose Route Frequency Provider Last Rate Last Admin   0.9 %  sodium chloride infusion  500 mL Intravenous Once Pyrtle, Carie Caddy, MD       Health Maintenance:   Immunization History  Administered Date(s) Administered   Covid-19, Mrna,Vaccine(Spikevax)57yrs and older 01/22/2023   Influenza Inj Mdck Quad With Preservative 07/26/2017, 08/11/2018, 07/13/2019, 07/25/2020   Influenza Split 06/30/2013, 07/20/2014, 07/21/2015   Influenza, High Dose Seasonal PF 07/05/2022   Influenza,inj,Quad PF,6+ Mos 08/04/2021   Influenza,inj,quad, With Preservative 07/05/2016    Influenza-Unspecified 07/24/2022   PFIZER(Purple Top)SARS-COV-2 Vaccination 12/05/2019, 12/26/2019, 07/23/2020   PPD Test 02/22/2014, 05/10/2015   Pfizer Covid-19 Vaccine Bivalent Booster 46yrs & up 06/21/2022   Tdap 10/09/2011   Zoster Recombinant(Shingrix) 01/09/2017, 02/06/2018, 02/06/2018   Colonoscopy:07/2021 due 2027 Mammo: 09/2021 BMD: 2015 WNL will wait until closer to 66 Pap/ Pelvic: 2021WNL - HPV neg  EKG 12/28/20 Echo 01/2021  EYE: Dr. Sharlot Gowda, last 2021, wears glasses Dentist: Dr. Darrol Angel, Q 48month   Allergies: No Known Allergies Medical History:  has GERD; Constipation; History of peptic ulcer disease; Family history of malignant neoplasm of gastrointestinal tract; Benign neoplasm of colon; Mobitz type 2 second degree and high-grade heart block; Hyperlipidemia; Vitamin D deficiency; Atrial fibrillation (HCC); HTN (hypertension); Abnormal glucose; Medication management; Overweight (BMI 25.0-29.9); Meralgia paraesthetica, left; Plantar fasciitis of left foot; Lateral epicondylitis of left elbow; Osteoarthritis of multiple joints; Salivary stone; History of renal calculi; and Multiple myeloma (HCC) on their problem list. Surgical History:  She  has a past surgical history that includes Breast cyst excision (1980); Wrist surgery (Right, 2011); permanent pacemaker insertion (N/A, 01/13/2013); Colonoscopy; Polypectomy; Plantar fasciectomy; and Extracorporeal shock wave lithotripsy (Right, 12/28/2019). Family History:  Her family history includes Anemia in her sister; Cervical cancer in her mother; Colon cancer (age of onset: 38) in her sister; Diabetes in her mother; HIV/AIDS in her brother; Heart disease in her father; Hyperlipidemia in her father; Hypertension in her mother and son; Kidney disease in her son; Leukemia in her sister; Multiple myeloma in her maternal aunt; Multiple myeloma (age of onset: 67) in her son; Prostate cancer (age of onset: 45) in her brother. Social History:    reports that she has never smoked. She has never used smokeless tobacco. She reports that she does not drink alcohol and does not use drugs.  Review of Systems: Review of Systems  Constitutional:  Negative for chills, fever, malaise/fatigue and weight loss.  HENT:  Negative for congestion, ear discharge, hearing loss, sore throat (pt had sore throat for approx week but had salivary stone she removed and has resolved) and tinnitus.   Eyes:  Negative for blurred vision, double vision, discharge and redness.  Respiratory:  Negative for cough, sputum production, shortness of breath and wheezing.   Cardiovascular:  Negative for chest pain, palpitations, orthopnea, claudication, leg swelling and PND.  Gastrointestinal:  Negative for abdominal pain, blood in stool, constipation, heartburn, melena, nausea and vomiting.  Genitourinary:  Negative for dysuria, frequency and urgency.       Decreased libido, vaginal dryness  Musculoskeletal:  Positive for joint pain (right shoulder). Negative for back pain, falls and myalgias.  Skin:  Negative for rash.  Neurological:  Negative for dizziness, tingling, sensory change, seizures, weakness and headaches.  Endo/Heme/Allergies:  Negative for polydipsia. Does not bruise/bleed easily.  Psychiatric/Behavioral: Negative.  Negative for depression, hallucinations, memory loss, substance abuse and suicidal ideas. The patient is not nervous/anxious and does not have insomnia.  All other systems reviewed and are negative.   Physical Exam: Estimated body mass index is 31.42 kg/m as calculated from the following:   Height as of 04/15/23: 5\' 5"  (1.651 m).   Weight as of 04/15/23: 188 lb 12.8 oz (85.6 kg). There were no vitals taken for this visit. General Appearance: Well nourished, in no apparent distress.  Eyes: PERRLA, EOMs, conjunctiva no swelling or erythema, normal fundi and vessels.  Sinuses: No Frontal/maxillary tenderness  ENT/Mouth: Ext aud canals clear,  normal light reflex with TMs without erythema, bulging. Good dentition. No erythema, swelling, or exudate on post pharynx. Tonsils not swollen or erythematous. Hearing normal.  Neck: Supple, thyroid normal. No bruits  Respiratory: Respiratory effort normal, BS equal bilaterally without rales, rhonchi, wheezing or stridor.  Cardio: RRR without murmurs, rubs or gallops. Brisk peripheral pulses without edema.  Chest: symmetric, with normal excursions and percussion.  Breasts: Symmetric, without lumps, nipple discharge, retractions.  Abdomen: Soft, nontender, no guarding, rebound, hernias, masses, or organomegaly.  Lymphatics: Non tender without lymphadenopathy.  Genitourinary: deferred Musculoskeletal: Full ROM all peripheral extremities,5/5 strength, and normal gait, neg straight leg. Full ROM left hip. + pin point left SI tenderness to palpation.  Skin: Warm, dry without rashes, lesions, ecchymosis. Neuro: Cranial nerves intact, reflexes equal bilaterally. Normal muscle tone, no cerebellar symptoms. Sensation intact.  Psych: Awake and oriented X 3, normal affect, Insight and Judgment appropriate.   EKG: NSR, pacemaker  Nesta Scaturro E  8:09 AM [facility]

## 2023-04-17 ENCOUNTER — Ambulatory Visit: Payer: BLUE CROSS/BLUE SHIELD | Admitting: Cardiology

## 2023-04-17 ENCOUNTER — Encounter: Payer: BLUE CROSS/BLUE SHIELD | Admitting: Nurse Practitioner

## 2023-04-17 ENCOUNTER — Ambulatory Visit (INDEPENDENT_AMBULATORY_CARE_PROVIDER_SITE_OTHER): Payer: Medicare Other | Admitting: Nurse Practitioner

## 2023-04-17 ENCOUNTER — Encounter: Payer: Self-pay | Admitting: Nurse Practitioner

## 2023-04-17 VITALS — BP 130/78 | HR 75 | Temp 97.3°F | Ht 64.5 in | Wt 191.8 lb

## 2023-04-17 DIAGNOSIS — I441 Atrioventricular block, second degree: Secondary | ICD-10-CM | POA: Diagnosis not present

## 2023-04-17 DIAGNOSIS — E663 Overweight: Secondary | ICD-10-CM

## 2023-04-17 DIAGNOSIS — Z124 Encounter for screening for malignant neoplasm of cervix: Secondary | ICD-10-CM

## 2023-04-17 DIAGNOSIS — Z1389 Encounter for screening for other disorder: Secondary | ICD-10-CM

## 2023-04-17 DIAGNOSIS — Z0001 Encounter for general adult medical examination with abnormal findings: Secondary | ICD-10-CM

## 2023-04-17 DIAGNOSIS — I10 Essential (primary) hypertension: Secondary | ICD-10-CM

## 2023-04-17 DIAGNOSIS — D126 Benign neoplasm of colon, unspecified: Secondary | ICD-10-CM

## 2023-04-17 DIAGNOSIS — Z9484 Stem cells transplant status: Secondary | ICD-10-CM

## 2023-04-17 DIAGNOSIS — Z8711 Personal history of peptic ulcer disease: Secondary | ICD-10-CM

## 2023-04-17 DIAGNOSIS — Z136 Encounter for screening for cardiovascular disorders: Secondary | ICD-10-CM

## 2023-04-17 DIAGNOSIS — Z1329 Encounter for screening for other suspected endocrine disorder: Secondary | ICD-10-CM

## 2023-04-17 DIAGNOSIS — K219 Gastro-esophageal reflux disease without esophagitis: Secondary | ICD-10-CM

## 2023-04-17 DIAGNOSIS — M159 Polyosteoarthritis, unspecified: Secondary | ICD-10-CM

## 2023-04-17 DIAGNOSIS — E559 Vitamin D deficiency, unspecified: Secondary | ICD-10-CM

## 2023-04-17 DIAGNOSIS — E7800 Pure hypercholesterolemia, unspecified: Secondary | ICD-10-CM

## 2023-04-17 DIAGNOSIS — C9000 Multiple myeloma not having achieved remission: Secondary | ICD-10-CM

## 2023-04-17 DIAGNOSIS — N952 Postmenopausal atrophic vaginitis: Secondary | ICD-10-CM

## 2023-04-17 DIAGNOSIS — Z79899 Other long term (current) drug therapy: Secondary | ICD-10-CM

## 2023-04-17 DIAGNOSIS — R7309 Other abnormal glucose: Secondary | ICD-10-CM

## 2023-04-17 DIAGNOSIS — I480 Paroxysmal atrial fibrillation: Secondary | ICD-10-CM | POA: Diagnosis not present

## 2023-04-17 DIAGNOSIS — C9001 Multiple myeloma in remission: Secondary | ICD-10-CM

## 2023-04-17 MED ORDER — ESTRADIOL 0.1 MG/GM VA CREA
TOPICAL_CREAM | VAGINAL | 12 refills | Status: DC
Start: 2023-04-17 — End: 2023-06-28

## 2023-04-17 NOTE — Patient Instructions (Signed)
Estradiol vaginal cream- insert 1 applicator every night x 2 weeks then every other night x 2 weeks and then twice a week for maintenance  Atrophic Vaginitis Atrophic vaginitis is a condition in which the tissues that line the vagina become dry and thin. This condition is most common in women who have stopped having regular menstrual periods (are in menopause). This usually starts when a woman is 65 to 66 years old. That is the time when a woman's estrogen levels begin to decrease. Estrogen is a female hormone. It helps to keep the tissues of the vagina moist. It stimulates the vagina to produce a clear fluid that lubricates the vagina for sex. This fluid also protects the vagina from infection. Lack of estrogen can cause the lining of the vagina to get thinner and dryer. The vagina may also shrink in size. It may become less elastic. Atrophic vaginitis tends to get worse over time as a woman's estrogen level drops. What are the causes? This condition is caused by the normal drop in estrogen that happens around the time of menopause. What increases the risk? Certain conditions or situations may lower a woman's estrogen level, leading to a higher risk for atrophic vaginitis. You are more likely to develop this condition if: You are taking medicines that block estrogen. You have had your ovaries removed. You are being treated for cancer with radiation or medicines (chemotherapy). You have given birth or are breastfeeding. You are older than age 51. You smoke. What are the signs or symptoms? Symptoms of this condition include: Pain, soreness, a feeling of pressure, or bleeding during sex (dyspareunia). Vaginal burning, irritation, or itching. Pain or bleeding when a speculum is used in a vaginal exam. Having burning pain while urinating. Vaginal discharge. In some cases, there are no symptoms. How is this diagnosed? This condition is diagnosed based on your medical history and a physical exam.  This will include a pelvic exam that checks the vaginal tissues. Though rare, you may also have other tests, including: A urine test. A test that checks the acid balance in your vagina (acid balance test). How is this treated? Treatment for this condition depends on how severe your symptoms are. Treatment may include: Using an over-the-counter vaginal lubricant before sex. Using a long-acting vaginal moisturizer. Using low-dose estrogen for moderate to severe symptoms that do not respond to other treatments. Options include creams, tablets, and inserts (vaginal rings). Before you use a vaginal estrogen, tell your health care provider if you have a history of: Breast cancer. Endometrial cancer. Blood clots. If you are not sexually active and your symptoms are very mild, you may not need treatment. Follow these instructions at home: Medicines Take over-the-counter and prescription medicines only as told by your health care provider. Do not use herbal or alternative medicines unless your health care provider says that you can. Use over-the-counter creams, lubricants, or moisturizers for dryness only as told by your health care provider. General instructions If your atrophic vaginitis is caused by menopause, discuss all of your menopause symptoms and treatment options with your health care provider. Do not douche. Do not use products that can make your vagina dry. These include: Scented feminine sprays. Scented tampons. Scented soaps. Vaginal sex can help to improve blood flow and elasticity of vaginal tissue. If you choose to have sex and it hurts, try using a water-soluble lubricant or moisturizer right before having sex. Contact a health care provider if: Your discharge looks different than normal. Your vagina  has an unusual smell. You have new symptoms. Your symptoms do not improve with treatment. Your symptoms get worse. Summary Atrophic vaginitis is a condition in which the tissues  that line the vagina become dry and thin. It is most common in women who have stopped having regular menstrual periods (are in menopause). Treatment options include using vaginal lubricants and low-dose vaginal estrogen. Contact a health care provider if your vagina has an unusual smell, or if your symptoms get worse or do not improve after treatment. This information is not intended to replace advice given to you by your health care provider. Make sure you discuss any questions you have with your health care provider. Document Revised: 03/24/2020 Document Reviewed: 03/24/2020 Elsevier Patient Education  2024 [employer].

## 2023-04-18 ENCOUNTER — Encounter: Payer: Self-pay | Admitting: Hematology

## 2023-04-18 LAB — CBC WITH DIFFERENTIAL/PLATELET
Absolute Monocytes: 688 cells/uL (ref 200–950)
Basophils Absolute: 29 cells/uL (ref 0–200)
Basophils Relative: 0.8 %
Eosinophils Absolute: 29 cells/uL (ref 15–500)
Eosinophils Relative: 0.8 %
HCT: 35.4 % (ref 35.0–45.0)
Hemoglobin: 11.8 g/dL (ref 11.7–15.5)
Lymphs Abs: 1447 cells/uL (ref 850–3900)
MCH: 29.9 pg (ref 27.0–33.0)
MCHC: 33.3 g/dL (ref 32.0–36.0)
MCV: 89.6 fL (ref 80.0–100.0)
MPV: 9.2 fL (ref 7.5–12.5)
Monocytes Relative: 19.1 %
Neutro Abs: 1408 cells/uL — ABNORMAL LOW (ref 1500–7800)
Neutrophils Relative %: 39.1 %
Platelets: 249 10*3/uL (ref 140–400)
RBC: 3.95 10*6/uL (ref 3.80–5.10)
RDW: 15.1 % — ABNORMAL HIGH (ref 11.0–15.0)
Total Lymphocyte: 40.2 %
WBC: 3.6 10*3/uL — ABNORMAL LOW (ref 3.8–10.8)

## 2023-04-18 LAB — COMPLETE METABOLIC PANEL WITH GFR
AG Ratio: 2.3 (calc) (ref 1.0–2.5)
ALT: 15 U/L (ref 6–29)
AST: 15 U/L (ref 10–35)
Albumin: 4.4 g/dL (ref 3.6–5.1)
Alkaline phosphatase (APISO): 104 U/L (ref 37–153)
BUN: 15 mg/dL (ref 7–25)
CO2: 27 mmol/L (ref 20–32)
Calcium: 9.7 mg/dL (ref 8.6–10.4)
Chloride: 105 mmol/L (ref 98–110)
Creat: 0.67 mg/dL (ref 0.50–1.05)
Globulin: 1.9 g/dL (calc) (ref 1.9–3.7)
Glucose, Bld: 80 mg/dL (ref 65–99)
Potassium: 4.3 mmol/L (ref 3.5–5.3)
Sodium: 143 mmol/L (ref 135–146)
Total Bilirubin: 0.5 mg/dL (ref 0.2–1.2)
Total Protein: 6.3 g/dL (ref 6.1–8.1)
eGFR: 97 mL/min/{1.73_m2} (ref >=60)

## 2023-04-18 LAB — URINALYSIS, ROUTINE W REFLEX MICROSCOPIC
Bilirubin Urine: NEGATIVE
Glucose, UA: NEGATIVE
Hgb urine dipstick: NEGATIVE
Ketones, ur: NEGATIVE
Leukocytes,Ua: NEGATIVE
Nitrite: NEGATIVE
Protein, ur: NEGATIVE
Specific Gravity, Urine: 1.008 (ref 1.001–1.035)
pH: 6 (ref 5.0–8.0)

## 2023-04-18 LAB — TSH: TSH: 5.58 mIU/L — ABNORMAL HIGH (ref 0.40–4.50)

## 2023-04-18 LAB — LIPID PANEL
Cholesterol: 175 mg/dL (ref <200)
HDL: 49 mg/dL — ABNORMAL LOW (ref >=50)
LDL Cholesterol (Calc): 105 mg/dL (calc) — ABNORMAL HIGH
Non-HDL Cholesterol (Calc): 126 mg/dL (calc) (ref <130)
Total CHOL/HDL Ratio: 3.6 (calc) (ref <5.0)
Triglycerides: 112 mg/dL (ref <150)

## 2023-04-18 LAB — MICROALBUMIN / CREATININE URINE RATIO
Creatinine, Urine: 45 mg/dL (ref 20–275)
Microalb Creat Ratio: 4 mg/g creat (ref <30)
Microalb, Ur: 0.2 mg/dL

## 2023-04-18 LAB — VITAMIN D 25 HYDROXY (VIT D DEFICIENCY, FRACTURES): Vit D, 25-Hydroxy: 63 ng/mL (ref 30–100)

## 2023-04-18 LAB — HEMOGLOBIN A1C W/OUT EAG: Hgb A1c MFr Bld: 5.8 % of total Hgb — ABNORMAL HIGH (ref <5.7)

## 2023-04-18 LAB — MAGNESIUM: Magnesium: 2 mg/dL (ref 1.5–2.5)

## 2023-04-19 LAB — PAP, TP IMAGING, WNL RFLX HPV

## 2023-04-19 LAB — PAP, TP IMAGING W/ HPV RNA, RFLX HPV TYPE 16,18/45: HPV DNA High Risk: NOT DETECTED

## 2023-04-22 DIAGNOSIS — Z5112 Encounter for antineoplastic immunotherapy: Secondary | ICD-10-CM | POA: Diagnosis not present

## 2023-04-24 LAB — UPEP/UIFE/LIGHT CHAINS/TP, 24-HR UR
% BETA, Urine: 0 %
ALPHA 1 URINE: 0 %
Albumin, U: 100 %
Alpha 2, Urine: 0 %
Free Kappa Lt Chains,Ur: 2.98 mg/L (ref 1.17–86.46)
Free Kappa/Lambda Ratio: 2.92 (ref 1.83–14.26)
Free Lambda Lt Chains,Ur: 1.02 mg/L (ref 0.27–15.21)
GAMMA GLOBULIN URINE: 0 %
Total Protein, Urine-Ur/day: 115 mg/24 hr (ref 30–150)
Total Protein, Urine: 6.2 mg/dL
Total Volume: 1850

## 2023-04-24 NOTE — Progress Notes (Signed)
  Electrophysiology Office Follow up Visit Note:    Date:  04/25/2023   ID:  Alyssa Dudley, DOB 10-10-1955, MRN [id number]  PCP:  Lucky Cowboy, MD  [facility] Cardiologist:  None  [facility] Electrophysiologist:  Lanier Prude, MD    Interval History:    Alyssa Dudley is a 67 y.o. female who presents for a follow up visit.   Last seen January 15, 2023 by [facility].  At that appointment she reported a recent stem cell transplant.  Her permanent pacemaker was implanted in April 2014. She has been doing well.  She has a cruise planned for October degrees.      Past medical, surgical, social and family history were reviewed.  ROS:   Please see the history of present illness.    All other systems reviewed and are negative.  EKGs/Labs/Other Studies Reviewed:    The following studies were reviewed today:  April 25, 2023 in clinic device interrogation personally reviewed Battery longevity less than 3 months to ERI Lead parameter stable Atrial pacing 23% Ventricular pacing greater than 99% 1 atrial high rate lasting 6 seconds (atrial tach) No programming changes made today       Physical Exam:    VS:  BP 122/78   Pulse 70   Ht 5' 4.5" (1.638 m)   Wt 190 lb (86.2 kg)   SpO2 99%   BMI 32.11 kg/m     Wt Readings from Last 3 Encounters:  04/25/23 190 lb (86.2 kg)  04/17/23 191 lb 12.8 oz (87 kg)  04/15/23 188 lb 12.8 oz (85.6 kg)     GEN:  Well nourished, well developed in no acute distress CARDIAC: RRR, no murmurs, rubs, gallops.  Pacemaker pocket well-healed RESPIRATORY:  Clear to auscultation without rales, wheezing or rhonchi       ASSESSMENT:    1. Mobitz type 2 second degree heart block   2. Cardiac pacemaker in situ   3. Paroxysmal atrial fibrillation (HCC)    PLAN:    In order of problems listed above:  #Symptomatic bradycardia #Permanent pacemaker in situ Pacemaker functioning appropriately.  Approaching ERI.  Generator change was  discussed in detail with the patient today including the risks and recovery and she wishes to proceed when her device triggers ERI.  #Paroxysmal atrial fibrillation Low burden.  Previously not on anticoagulation given a low burden and low CHA2DS2-VASc score.  Will continue using her pacemaker to monitor the burden.    Signed, Steffanie Dunn, MD, [facility], [facility] 04/25/2023 8:54 AM    Electrophysiology [facility]

## 2023-04-25 ENCOUNTER — Ambulatory Visit: Payer: Medicare Other | Attending: Cardiology | Admitting: Cardiology

## 2023-04-25 ENCOUNTER — Encounter: Payer: Self-pay | Admitting: Cardiology

## 2023-04-25 VITALS — BP 122/78 | HR 70 | Ht 64.5 in | Wt 190.0 lb

## 2023-04-25 DIAGNOSIS — I480 Paroxysmal atrial fibrillation: Secondary | ICD-10-CM | POA: Diagnosis not present

## 2023-04-25 DIAGNOSIS — Z950 Presence of cardiac pacemaker: Secondary | ICD-10-CM | POA: Insufficient documentation

## 2023-04-25 DIAGNOSIS — I441 Atrioventricular block, second degree: Secondary | ICD-10-CM | POA: Diagnosis not present

## 2023-04-25 NOTE — Patient Instructions (Signed)
Medication Instructions:  Your physician recommends that you continue on your current medications as directed. Please refer to the Current Medication list given to you today.  *If you need a refill on your cardiac medications before your next appointment, please call your pharmacy*   Testing/Procedures: We will contact you when it is time to schedule your Pacemaker generator change   Follow-Up: At [facility], you and your health needs are our priority.  As part of our continuing mission to provide you with exceptional heart care, we have created designated Provider Care Teams.  These Care Teams include your primary Cardiologist (physician) and Advanced Practice Providers (APPs -  Physician Assistants and Nurse Practitioners) who all work together to provide you with the care you need, when you need it.  Your next appointment:   1 year(s)  Provider:   You may see Lanier Prude, MD or one of the following Advanced Practice Providers on your designated Care Team:   Francis Dowse, [place] [address]" [place], [place] Sherie Don, NP Canary Brim, NP

## 2023-05-01 NOTE — Progress Notes (Signed)
Remote pacemaker transmission.   

## 2023-05-07 ENCOUNTER — Other Ambulatory Visit: Payer: Self-pay

## 2023-05-07 MED ORDER — LENALIDOMIDE 10 MG PO CAPS: 10.0000 mg | ORAL_CAPSULE | Freq: Every day | ORAL | 0 refills | Status: DC

## 2023-05-09 ENCOUNTER — Encounter: Payer: Medicare Other | Admitting: Cardiology

## 2023-05-13 ENCOUNTER — Ambulatory Visit: Payer: BC Managed Care – PPO

## 2023-05-13 ENCOUNTER — Inpatient Hospital Stay: Payer: Medicare Other | Attending: Hematology

## 2023-05-13 ENCOUNTER — Inpatient Hospital Stay: Payer: Medicare Other

## 2023-05-13 VITALS — BP 144/72 | HR 77 | Temp 98.3°F | Resp 18 | Ht 64.5 in | Wt 188.0 lb

## 2023-05-13 DIAGNOSIS — Z5112 Encounter for antineoplastic immunotherapy: Secondary | ICD-10-CM | POA: Insufficient documentation

## 2023-05-13 DIAGNOSIS — Z79620 Long term (current) use of immunosuppressive biologic: Secondary | ICD-10-CM | POA: Diagnosis not present

## 2023-05-13 DIAGNOSIS — C9000 Multiple myeloma not having achieved remission: Secondary | ICD-10-CM | POA: Diagnosis present

## 2023-05-13 DIAGNOSIS — C9001 Multiple myeloma in remission: Secondary | ICD-10-CM

## 2023-05-13 LAB — CBC WITH DIFFERENTIAL (CANCER CENTER ONLY)
Abs Immature Granulocytes: 0 10*3/uL (ref 0.00–0.07)
Basophils Absolute: 0 10*3/uL (ref 0.0–0.1)
Basophils Relative: 1 %
Eosinophils Absolute: 0.2 10*3/uL (ref 0.0–0.5)
Eosinophils Relative: 5 %
HCT: 35.9 % — ABNORMAL LOW (ref 36.0–46.0)
Hemoglobin: 12.2 g/dL (ref 12.0–15.0)
Immature Granulocytes: 0 %
Lymphocytes Relative: 50 %
Lymphs Abs: 1.8 10*3/uL (ref 0.7–4.0)
MCH: 30.7 pg (ref 26.0–34.0)
MCHC: 34 g/dL (ref 30.0–36.0)
MCV: 90.4 fL (ref 80.0–100.0)
Monocytes Absolute: 0.6 10*3/uL (ref 0.1–1.0)
Monocytes Relative: 16 %
Neutro Abs: 1 10*3/uL — ABNORMAL LOW (ref 1.7–7.7)
Neutrophils Relative %: 28 %
Platelet Count: 212 10*3/uL (ref 150–400)
RBC: 3.97 MIL/uL (ref 3.87–5.11)
RDW: 15.4 % (ref 11.5–15.5)
WBC Count: 3.5 10*3/uL — ABNORMAL LOW (ref 4.0–10.5)
nRBC: 0 % (ref 0.0–0.2)

## 2023-05-13 MED ORDER — DARATUMUMAB-HYALURONIDASE-FIHJ 1800-30000 MG-UT/15ML ~~LOC~~ SOLN
1800.0000 mg | Freq: Once | SUBCUTANEOUS | Status: AC
  Administered 2023-05-13: 1800 mg via SUBCUTANEOUS
  Filled 2023-05-13: qty 15

## 2023-05-13 NOTE — Progress Notes (Signed)
Darzalex faspro D1/C3. OK to treat with ANC 1.0 per Dr. Mosetta Putt

## 2023-05-13 NOTE — Patient Instructions (Signed)
[facility]  Discharge Instructions: Thank you for choosing [facility] to provide your oncology and hematology care.   If you have a lab appointment with the Cancer Center, please go directly to the Cancer Center and check in at the registration area.   Wear comfortable clothing and clothing appropriate for easy access to any Portacath or PICC line.   We strive to give you quality time with your provider. You may need to reschedule your appointment if you arrive late (15 or more minutes).  Arriving late affects you and other patients whose appointments are after yours.  Also, if you miss three or more appointments without notifying the office, you may be dismissed from the clinic at the provider's discretion.      For prescription refill requests, have your pharmacy contact our office and allow 72 hours for refills to be completed.    Today you received the following chemotherapy and/or immunotherapy agents: Darazalex Faspro.       To help prevent nausea and vomiting after your treatment, we encourage you to take your nausea medication as directed.  BELOW ARE SYMPTOMS THAT SHOULD BE REPORTED IMMEDIATELY: *FEVER GREATER THAN 100.4 F (38 C) OR HIGHER *CHILLS OR SWEATING *NAUSEA AND VOMITING THAT IS NOT CONTROLLED WITH YOUR NAUSEA MEDICATION *UNUSUAL SHORTNESS OF BREATH *UNUSUAL BRUISING OR BLEEDING *URINARY PROBLEMS (pain or burning when urinating, or frequent urination) *BOWEL PROBLEMS (unusual diarrhea, constipation, pain near the anus) TENDERNESS IN MOUTH AND THROAT WITH OR WITHOUT PRESENCE OF ULCERS (sore throat, sores in mouth, or a toothache) UNUSUAL RASH, SWELLING OR PAIN  UNUSUAL VAGINAL DISCHARGE OR ITCHING   Items with * indicate a potential emergency and should be followed up as soon as possible or go to the Emergency Department if any problems should occur.  Please show the CHEMOTHERAPY ALERT CARD or IMMUNOTHERAPY ALERT CARD  at check-in to the Emergency Department and triage nurse.  Should you have questions after your visit or need to cancel or reschedule your appointment, please contact [facility]  Dept: [phone]  and follow the prompts.  Office hours are 8:00 a.m. to 4:30 p.m. Monday - Friday. Please note that voicemails left after 4:00 p.m. may not be returned until the following business day.  We are closed weekends and major holidays. You have access to a nurse at all times for urgent questions. Please call the main number to the clinic Dept: [phone] and follow the prompts.   For any non-urgent questions, you may also contact your provider using MyChart. We now offer e-Visits for anyone 77 and older to request care online for non-urgent symptoms. For details visit [email or web address].   Also download the MyChart app! Go to the app store, search "MyChart", open the app, select [facility], and log in with your MyChart username and password.

## 2023-05-16 ENCOUNTER — Ambulatory Visit (INDEPENDENT_AMBULATORY_CARE_PROVIDER_SITE_OTHER): Payer: Medicare Other

## 2023-05-16 DIAGNOSIS — I441 Atrioventricular block, second degree: Secondary | ICD-10-CM | POA: Diagnosis not present

## 2023-06-06 ENCOUNTER — Other Ambulatory Visit: Payer: Self-pay

## 2023-06-06 MED ORDER — LENALIDOMIDE 10 MG PO CAPS: 10.0000 mg | ORAL_CAPSULE | Freq: Every day | ORAL | 0 refills | Status: DC

## 2023-06-11 NOTE — Assessment & Plan Note (Signed)
IgG lambda type, stage II, high risk -Diagnosed with CRAB criteria and bone marrow bx 05/01/2022, pathology confirmed plasma cell myeloma (60% plasma cells). Cytogenics and FISH c/w standard risk multiple myeloma except the deletion of 1P and gain of 1 q. which is a higher risk feature.  She is being treated as high risk -S/p 5 cycles induction DaraVRd starting 05/15/22 followed by autologous stem cell transplant 10/12/22 by Dr. Rosaria Ferries. Post transplant PET and marrow showed no evidence of disease -she received one cycle of DaraVRd to complete a total of 6 cycles -will continue dara and revlimid maintenance, with daratumumab injection 1800 mg every month for 2 years and Revlimid 10 mg on days 1-21 every 28 days, she started on March 18, 2023. -Will check her multiple myeloma labs and f/u every 2 months -She will also follow-up with her transplant team

## 2023-06-12 ENCOUNTER — Encounter: Payer: Self-pay | Admitting: Hematology

## 2023-06-12 ENCOUNTER — Inpatient Hospital Stay (HOSPITAL_BASED_OUTPATIENT_CLINIC_OR_DEPARTMENT_OTHER): Payer: Medicare Other | Admitting: Hematology

## 2023-06-12 ENCOUNTER — Inpatient Hospital Stay: Payer: Medicare Other

## 2023-06-12 ENCOUNTER — Inpatient Hospital Stay: Payer: Medicare Other | Attending: Hematology

## 2023-06-12 VITALS — BP 145/74 | HR 67 | Temp 98.0°F | Resp 16 | Ht 64.5 in | Wt 187.4 lb

## 2023-06-12 DIAGNOSIS — C9000 Multiple myeloma not having achieved remission: Secondary | ICD-10-CM

## 2023-06-12 DIAGNOSIS — Z5112 Encounter for antineoplastic immunotherapy: Secondary | ICD-10-CM | POA: Insufficient documentation

## 2023-06-12 DIAGNOSIS — C9001 Multiple myeloma in remission: Secondary | ICD-10-CM | POA: Diagnosis not present

## 2023-06-12 DIAGNOSIS — Z79620 Long term (current) use of immunosuppressive biologic: Secondary | ICD-10-CM | POA: Insufficient documentation

## 2023-06-12 LAB — CBC WITH DIFFERENTIAL (CANCER CENTER ONLY)
Abs Immature Granulocytes: 0.01 10*3/uL (ref 0.00–0.07)
Basophils Absolute: 0 10*3/uL (ref 0.0–0.1)
Basophils Relative: 1 %
Eosinophils Absolute: 0.1 10*3/uL (ref 0.0–0.5)
Eosinophils Relative: 3 %
HCT: 39 % (ref 36.0–46.0)
Hemoglobin: 13.2 g/dL (ref 12.0–15.0)
Immature Granulocytes: 0 %
Lymphocytes Relative: 35 %
Lymphs Abs: 1.5 10*3/uL (ref 0.7–4.0)
MCH: 30.6 pg (ref 26.0–34.0)
MCHC: 33.8 g/dL (ref 30.0–36.0)
MCV: 90.5 fL (ref 80.0–100.0)
Monocytes Absolute: 0.5 10*3/uL (ref 0.1–1.0)
Monocytes Relative: 12 %
Neutro Abs: 2.1 10*3/uL (ref 1.7–7.7)
Neutrophils Relative %: 49 %
Platelet Count: 195 10*3/uL (ref 150–400)
RBC: 4.31 MIL/uL (ref 3.87–5.11)
RDW: 15.3 % (ref 11.5–15.5)
WBC Count: 4.4 10*3/uL (ref 4.0–10.5)
nRBC: 0 % (ref 0.0–0.2)

## 2023-06-12 MED ORDER — DARATUMUMAB-HYALURONIDASE-FIHJ 1800-30000 MG-UT/15ML ~~LOC~~ SOLN
1800.0000 mg | Freq: Once | SUBCUTANEOUS | Status: AC
  Administered 2023-06-12: 1800 mg via SUBCUTANEOUS
  Filled 2023-06-12: qty 15

## 2023-06-12 NOTE — Progress Notes (Signed)
Patient states that she took premedications at home prior to infusion appointment.

## 2023-06-12 NOTE — Progress Notes (Signed)
[facility]   Telephone:([phone]) [phone] [phone]) [phone]   Clinic Follow up Note   Patient Care Team: Lucky Cowboy, MD as PCP - General (Internal Medicine) Lanier Prude, MD as PCP - Electrophysiology (Cardiology) Mardella Layman, MD as Consulting Physician (Gastroenterology) Gelene Mink, OD as Referring Physician (Optometry) Pricilla Riffle, MD as Consulting Physician (Cardiology) Cindee Salt, MD as Consulting Physician (Orthopedic Surgery) Reece Levy, MD as Referring Physician (Hematology and Oncology) Malachy Mood, MD as Attending Physician (Hematology and Oncology)  Date of Service:  06/12/2023  CHIEF COMPLAINT: f/u of multiple myeloma   CURRENT THERAPY:  Maintenance daratumumab SQ q28d, starting March 18, 2023 Revlimid 10 mg daily on day 1-21, every 28 days, starting 6/10    ASSESSMENT:  Alyssa Dudley is a 67 y.o. female with   Multiple myeloma (HCC)  IgG lambda type, stage II, high risk -Diagnosed with CRAB criteria and bone marrow bx 05/01/2022, pathology confirmed plasma cell myeloma (60% plasma cells). Cytogenics and FISH c/w standard risk multiple myeloma except the deletion of 1P and gain of 1 q. which is a higher risk feature.  She is being treated as high risk -S/p 5 cycles induction DaraVRd starting 05/15/22 followed by autologous stem cell transplant 10/12/22 by Dr. Rosaria Ferries. Post transplant PET and marrow showed no evidence of disease -she received one cycle of DaraVRd to complete a total of 6 cycles -will continue dara and revlimid maintenance, with daratumumab injection 1800 mg every month for 2 years and Revlimid 10 mg on days 1-21 every 28 days, she started on March 18, 2023. -Will check her multiple myeloma labs and f/u every 2 months -She will also follow-up with her transplant team -We again reviewed the risk of secondary cancer from Revlimid, and she will continue cancer screening with mammogram, colonoscopy and Pap smear  etc. -I also strongly encouraged her to get vaccinations, especially flu shot and COVID shot, due to her high risk for infection.  Agrees.    PLAN: -lab reviewed - I reviewed multiple myeloma labs w/ pt - Continue daratumumab every 4 weeks for a total of two years -proceed with Dara today, OK to treat wo CMP today   -request  appt 9/30 to be change to 10/2 due to vacation -lab and f/u  q2 months  SUMMARY OF ONCOLOGIC HISTORY: Oncology History Overview Note   Cancer Staging  Multiple myeloma (HCC) Staging form: Plasma Cell Myeloma and Plasma Cell Disorders, AJCC 8th Edition - Clinical stage from 05/01/2022: RISS Stage II (Beta-2-microglobulin (mg/L): 3.2, Albumin (g/dL): 3.6, ISS: Stage I, High-risk cytogenetics: Absent, LDH: Elevated) - Signed by Malachy Mood, MD on 06/03/2022 Stage prefix: Initial diagnosis Beta 2 microglobulin range (mg/L): Less than 3.5 Albumin range (g/dL): Greater than or equal to 3.5 Cytogenetics: 1p deletion     Multiple myeloma (HCC)  05/01/2022 Cancer Staging   Staging form: Plasma Cell Myeloma and Plasma Cell Disorders, AJCC 8th Edition - Clinical stage from 05/01/2022: RISS Stage II (Beta-2-microglobulin (mg/L): 3.2, Albumin (g/dL): 3.6, ISS: Stage I, High-risk cytogenetics: Absent, LDH: Elevated) - Signed by Malachy Mood, MD on 06/03/2022 Stage prefix: Initial diagnosis Beta 2 microglobulin range (mg/L): Less than 3.5 Albumin range (g/dL): Greater than or equal to 3.5 Cytogenetics: 1p deletion   05/08/2022 Initial Diagnosis   Multiple myeloma (HCC)   05/14/2022 - 02/28/2023 Chemotherapy   Patient is on Treatment Plan : MYELOMA NEWLY DIAGNOSED TRANSPLANT CANDIDATE DaraVRd (Daratumumab IV) q21d x 6 Cycles (Induction/Consolidation)     05/15/2022 -  05/28/2022 Chemotherapy   Patient is on Treatment Plan : MYELOMA NEWLY DIAGNOSED TRANSPLANT CANDIDATE DaraVRd (Daratumumab IV) q21d x 6 Cycles (Induction/Consolidation)     03/18/2023 -  Chemotherapy   Patient is on  Treatment Plan : MYELOMA Daratumumab SQ q28d        INTERVAL HISTORY:  Alyssa Dudley is here for a follow up of multiple myeloma. She was last seen by me on 04/15/2023. She presents to the clinic alone. Pt state that she has no issues from the treatments. She is on Revlimid this week. Pt state that she is having hot flashes.     All other systems were reviewed with the patient and are negative.  MEDICAL HISTORY:  Past Medical History:  Diagnosis Date   Allergy    once a year    Hyperlipidemia    borderline- no meds    Pacemaker    Paroxysmal atrial fibrillation (HCC) 6/15   detected on ppm interrogation   PONV (postoperative nausea and vomiting)    has vomitted in past   Second degree Mobitz II AV block    Vitamin D deficiency     SURGICAL HISTORY: Past Surgical History:  Procedure Laterality Date   BREAST CYST EXCISION  1980   COLONOSCOPY     EXTRACORPOREAL SHOCK WAVE LITHOTRIPSY Right 12/28/2019   Procedure: RIGHT EXTRACORPOREAL SHOCK WAVE LITHOTRIPSY (ESWL);  Surgeon: Heloise Purpura, MD;  Location: [facility];  Service: Urology;  Laterality: Right;   PERMANENT PACEMAKER INSERTION N/A 01/13/2013   SJM [employer] DR RF implanted by Dr Johney Frame for mobitz II AV block   PLANTAR FASCIECTOMY     POLYPECTOMY     WRIST SURGERY Right 2011   removed cyst    I have reviewed the social history and family history with the patient and they are unchanged from previous note.  ALLERGIES:  has No Known Allergies.  MEDICATIONS:  Current Outpatient Medications  Medication Sig Dispense Refill   acyclovir (ZOVIRAX) 400 MG tablet Take 1 tablet (400 mg total) by mouth 2 (two) times daily. 60 tablet 5   aspirin 81 MG chewable tablet Chew by mouth daily.     atenolol (TENORMIN) 50 MG tablet TAKE 1 TABLET BY MOUTH DAILY FOR BLOOD PRESSURE 90 tablet 3   chlorhexidine (PERIDEX) 0.12 % solution 10 mLs as needed.     dexamethasone (DECADRON) 4 MG tablet Take 5 tablets (20 mg total)  by mouth every 30 (thirty) days. 1-2 hours before chemo injections every 4 weeks 15 tablet 0   estradiol (ESTRACE VAGINAL) 0.1 MG/GM vaginal cream 1 applicator at bedtime x 14 days then 1 applicator at bedtime every other day for 14 days and then maintenance of 1 applicator twice a week at bedtime 42.5 g 12   lenalidomide (REVLIMID) 10 MG capsule Take 1 capsule (10 mg total) by mouth daily. Take 1 capsules (10 mg total) by mouth daily for 21 days with 7 days off for a 28 day cycle.  [employer] Auth # [id number]     Date Obtained 06/06/2023 28 capsule 0   Multiple Vitamin (MULTIVITAMIN) tablet Take 1 tablet by mouth daily.     triamcinolone cream (KENALOG) 0.1 % APPLY EXTERNALLY TO THE AFFECTED AREA THREE TIMES DAILY AS NEEDED FOR RASH 30 g 2   Vitamin D, Ergocalciferol, (DRISDOL) 1.25 MG (50000 UNIT) CAPS capsule TAKE 1 CAPSULE BY MOUTH TWICE WEEKLY 180 capsule 0   Current Facility-Administered Medications  Medication Dose Route Frequency Provider Last Rate Last Admin  0.9 %  sodium chloride infusion  500 mL Intravenous Once Pyrtle, Carie Caddy, MD       Facility-Administered Medications Ordered in Other Visits  Medication Dose Route Frequency Provider Last Rate Last Admin   daratumumab-hyaluronidase-fihj (DARZALEX FASPRO) 1800-30000 MG-UT/15ML chemo SQ injection 1,800 mg  1,800 mg Subcutaneous Once Malachy Mood, MD        PHYSICAL EXAMINATION: ECOG PERFORMANCE STATUS: 0 - Asymptomatic  Vitals:   06/12/23 0843  BP: (!) 145/74  Pulse: 67  Resp: 16  Temp: 98 F (36.7 C)  SpO2: 100%   Wt Readings from Last 3 Encounters:  06/12/23 187 lb 6.4 oz (85 kg)  05/13/23 188 lb (85.3 kg)  04/25/23 190 lb (86.2 kg)     GENERAL:alert, no distress and comfortable SKIN: skin color normal, no rashes or significant lesions EYES: normal, Conjunctiva are pink and non-injected, sclera clear  NEURO: alert & oriented x 3 with fluent speech  LABORATORY DATA:  I have reviewed the data as listed    Latest Ref Rng  & Units 06/12/2023    8:12 AM 05/13/2023    7:50 AM 04/17/2023    9:50 AM  CBC  WBC 4.0 - 10.5 K/uL 4.4  3.5  3.6   Hemoglobin 12.0 - 15.0 g/dL 16.1  09.6  04.5   Hematocrit 36.0 - 46.0 % 39.0  35.9  35.4   Platelets 150 - 400 K/uL 195  212  249         Latest Ref Rng & Units 04/17/2023    9:50 AM 04/15/2023    7:43 AM 03/18/2023    8:32 AM  CMP  Glucose 65 - 99 mg/dL 80  98  409   BUN 7 - 25 mg/dL 15  11  9    Creatinine 0.50 - 1.05 mg/dL 8.11  9.14  7.82   Sodium 135 - 146 mmol/L 143  142  143   Potassium 3.5 - 5.3 mmol/L 4.3  4.3  3.9   Chloride 98 - 110 mmol/L 105  107  107   CO2 20 - 32 mmol/L 27  28  31    Calcium 8.6 - 10.4 mg/dL 9.7  9.4  9.8   Total Protein 6.1 - 8.1 g/dL 6.3  6.7  7.0   Total Bilirubin 0.2 - 1.2 mg/dL 0.5  0.6  0.6   Alkaline Phos 38 - 126 U/L  107  114   AST 10 - 35 U/L 15  15  16    ALT 6 - 29 U/L 15  16  15        RADIOGRAPHIC STUDIES: I have personally reviewed the radiological images as listed and agreed with the findings in the report. No results found.    Orders Placed This Encounter  Procedures   Comprehensive metabolic panel    Standing Status:   Standing    Number of Occurrences:   50    Standing Expiration Date:   06/11/2024   CBC with Differential (Cancer Center Only)    Standing Status:   Future    Standing Expiration Date:   09/01/2024   CBC with Differential (Cancer Center Only)    Standing Status:   Future    Standing Expiration Date:   09/29/2024   CBC with Differential (Cancer Center Only)    Standing Status:   Future    Standing Expiration Date:   10/27/2024   All questions were answered. The patient knows to call the clinic with any problems, questions or  concerns. No barriers to learning was detected. The total time spent in the appointment was 25 minutes.     Malachy Mood, MD 06/12/2023   Carolin Coy, CMA, am acting as scribe for Malachy Mood, MD.   I have reviewed the above documentation for accuracy and completeness,  and I agree with the above.

## 2023-06-12 NOTE — Patient Instructions (Signed)
 [facility]  Discharge Instructions: Thank you for choosing [facility] to provide your oncology and hematology care.   If you have a lab appointment with the Cancer Center, please go directly to the Cancer Center and check in at the registration area.   Wear comfortable clothing and clothing appropriate for easy access to any Portacath or PICC line.   We strive to give you quality time with your provider. You may need to reschedule your appointment if you arrive late (15 or more minutes).  Arriving late affects you and other patients whose appointments are after yours.  Also, if you miss three or more appointments without notifying the office, you may be dismissed from the clinic at the provider's discretion.      For prescription refill requests, have your pharmacy contact our office and allow 72 hours for refills to be completed.    Today you received the following chemotherapy and/or immunotherapy agents: Darazalex Faspro.       To help prevent nausea and vomiting after your treatment, we encourage you to take your nausea medication as directed.  BELOW ARE SYMPTOMS THAT SHOULD BE REPORTED IMMEDIATELY: *FEVER GREATER THAN 100.4 F (38 C) OR HIGHER *CHILLS OR SWEATING *NAUSEA AND VOMITING THAT IS NOT CONTROLLED WITH YOUR NAUSEA MEDICATION *UNUSUAL SHORTNESS OF BREATH *UNUSUAL BRUISING OR BLEEDING *URINARY PROBLEMS (pain or burning when urinating, or frequent urination) *BOWEL PROBLEMS (unusual diarrhea, constipation, pain near the anus) TENDERNESS IN MOUTH AND THROAT WITH OR WITHOUT PRESENCE OF ULCERS (sore throat, sores in mouth, or a toothache) UNUSUAL RASH, SWELLING OR PAIN  UNUSUAL VAGINAL DISCHARGE OR ITCHING   Items with * indicate a potential emergency and should be followed up as soon as possible or go to the Emergency Department if any problems should occur.  Please show the CHEMOTHERAPY ALERT CARD or IMMUNOTHERAPY ALERT CARD  at check-in to the Emergency Department and triage nurse.  Should you have questions after your visit or need to cancel or reschedule your appointment, please contact [facility]  Dept: [phone]  and follow the prompts.  Office hours are 8:00 a.m. to 4:30 p.m. Monday - Friday. Please note that voicemails left after 4:00 p.m. may not be returned until the following business day.  We are closed weekends and major holidays. You have access to a nurse at all times for urgent questions. Please call the main number to the clinic Dept: [phone] and follow the prompts.   For any non-urgent questions, you may also contact your provider using MyChart. We now offer e-Visits for anyone 77 and older to request care online for non-urgent symptoms. For details visit [email or web address].   Also download the MyChart app! Go to the app store, search "MyChart", open the app, select [facility], and log in with your MyChart username and password.

## 2023-06-17 ENCOUNTER — Ambulatory Visit (INDEPENDENT_AMBULATORY_CARE_PROVIDER_SITE_OTHER): Payer: BC Managed Care – PPO

## 2023-06-17 DIAGNOSIS — I441 Atrioventricular block, second degree: Secondary | ICD-10-CM

## 2023-06-17 LAB — CUP PACEART REMOTE DEVICE CHECK
Battery Remaining Longevity: 1 mo
Battery Remaining Percentage: 0.5 %
Battery Voltage: 2.6 V
Brady Statistic AP VP Percent: 55 %
Brady Statistic AP VS Percent: 1 %
Brady Statistic AS VP Percent: 45 %
Brady Statistic AS VS Percent: 1 %
Brady Statistic RA Percent Paced: 55 %
Brady Statistic RV Percent Paced: 99 %
Date Time Interrogation Session: 20240909090533
Implantable Lead Connection Status: 753985
Implantable Lead Connection Status: 753985
Implantable Lead Implant Date: 20140408
Implantable Lead Implant Date: 20140408
Implantable Lead Location: 753859
Implantable Lead Location: 753860
Implantable Lead Model: 1944
Implantable Lead Model: 1948
Implantable Pulse Generator Implant Date: 20140408
Lead Channel Impedance Value: 510 Ohm
Lead Channel Impedance Value: 600 Ohm
Lead Channel Pacing Threshold Amplitude: 0.5 V
Lead Channel Pacing Threshold Amplitude: 0.75 V
Lead Channel Pacing Threshold Pulse Width: 0.4 ms
Lead Channel Pacing Threshold Pulse Width: 0.4 ms
Lead Channel Sensing Intrinsic Amplitude: 12 mV
Lead Channel Sensing Intrinsic Amplitude: 4.2 mV
Lead Channel Setting Pacing Amplitude: 1 V
Lead Channel Setting Pacing Amplitude: 1.5 V
Lead Channel Setting Pacing Pulse Width: 0.4 ms
Lead Channel Setting Sensing Sensitivity: 2 mV
Pulse Gen Model: 2210
Pulse Gen Serial Number: 7457437

## 2023-06-18 ENCOUNTER — Telehealth: Payer: Self-pay | Admitting: Cardiology

## 2023-06-18 ENCOUNTER — Telehealth: Payer: Self-pay

## 2023-06-18 DIAGNOSIS — I441 Atrioventricular block, second degree: Secondary | ICD-10-CM

## 2023-06-18 NOTE — Telephone Encounter (Signed)
  Per MyChart scheduling message:   Pt c/o Shortness Of Breath: STAT if SOB developed within the last 24 hours or pt is noticeably SOB on the phone  1. Are you currently SOB (can you hear that pt is SOB on the phone)?   2. How long have you been experiencing SOB?   3. Are you SOB when sitting or when up moving around?   4. Are you currently experiencing any other symptoms?     I am short of breath when I walk so far. I have to sit down to rest. I haven't had to do that in a long while. Some light chest pains and it's been doing this for about 2 weeks now

## 2023-06-18 NOTE — Telephone Encounter (Signed)
Patient reports that she has been getting short of breath with minimal exertion recently. She used to be able to walk 1 mile with no problem. Now she has to stop and take rests every once in a while. She states that she also has some dull pain in her chest at times. She is concerned about her pacemaker since she needs a battery change soon. Reassured her that her device was checked today and everything was normal and that she just now reached ERI. We have 3 months to replace her battery. The procedure has already been discussed with her at her previous appointment with Dr. Lalla Brothers. She is aware that she will be contacted to get this scheduled.  I have scheduled her an appointment with an APP to discuss her concerns of shortness of breath.

## 2023-06-18 NOTE — Telephone Encounter (Addendum)
Scheduled remote reviewed. Normal device function.   The device reached the elective replacement indicator today, sent to triage  Pt with recent office visit.  Has discussed procedure with Dr. Lalla Brothers.  Will forward for scheduling.

## 2023-06-21 NOTE — Telephone Encounter (Signed)
Pt is scheduled for a PPM Gen Change with Lalla Brothers on 9/30 at 3:30 PM. She will come to [facility] on 9/23 for labs, scrub and will pick up Instruction letter at that time.

## 2023-07-01 ENCOUNTER — Ambulatory Visit: Payer: Medicare Other | Admitting: Physician Assistant

## 2023-07-01 ENCOUNTER — Ambulatory Visit: Payer: Medicare Other | Attending: Cardiology

## 2023-07-01 ENCOUNTER — Other Ambulatory Visit: Payer: Self-pay

## 2023-07-01 DIAGNOSIS — I441 Atrioventricular block, second degree: Secondary | ICD-10-CM

## 2023-07-01 MED ORDER — LENALIDOMIDE 10 MG PO CAPS: 10.0000 mg | ORAL_CAPSULE | Freq: Every day | ORAL | 0 refills | Status: DC

## 2023-07-02 ENCOUNTER — Encounter: Payer: Self-pay | Admitting: Hematology

## 2023-07-02 LAB — CBC
Hematocrit: 40.6 % (ref 34.0–46.6)
Hemoglobin: 13.1 g/dL (ref 11.1–15.9)
MCH: 30.3 pg (ref 26.6–33.0)
MCHC: 32.3 g/dL (ref 31.5–35.7)
MCV: 94 fL (ref 79–97)
Platelets: 219 10*3/uL (ref 150–450)
RBC: 4.32 x10E6/uL (ref 3.77–5.28)
RDW: 15.6 % — ABNORMAL HIGH (ref 11.7–15.4)
WBC: 4.3 10*3/uL (ref 3.4–10.8)

## 2023-07-02 LAB — BASIC METABOLIC PANEL
BUN/Creatinine Ratio: 15 (ref 12–28)
BUN: 12 mg/dL (ref 8–27)
CO2: 26 mmol/L (ref 20–29)
Calcium: 9.6 mg/dL (ref 8.7–10.3)
Chloride: 105 mmol/L (ref 96–106)
Creatinine, Ser: 0.81 mg/dL (ref 0.57–1.00)
Glucose: 85 mg/dL (ref 70–99)
Potassium: 4.2 mmol/L (ref 3.5–5.2)
Sodium: 146 mmol/L — ABNORMAL HIGH (ref 134–144)
eGFR: 80 mL/min/{1.73_m2} (ref >59)

## 2023-07-02 NOTE — Progress Notes (Signed)
Remote pacemaker transmission.   

## 2023-07-03 NOTE — Progress Notes (Signed)
Remote pacemaker transmission.   

## 2023-07-05 NOTE — Pre-Procedure Instructions (Signed)
Instructed patient on the following items: Arrival time 1:30 Nothing to eat or drink after midnight No meds AM of procedure Responsible person to drive you home and stay with you for 24 hrs Wash with special soap night before and morning of procedure If on anti-coagulant drug instructions ASA- last dose 9/27

## 2023-07-08 ENCOUNTER — Ambulatory Visit: Payer: Medicare Other

## 2023-07-08 ENCOUNTER — Encounter (HOSPITAL_COMMUNITY)
Admission: RE | Disposition: A | Discharge status: Home / Self Care | Payer: Self-pay | Source: Home / Self Care | Attending: Cardiology

## 2023-07-08 ENCOUNTER — Ambulatory Visit (HOSPITAL_COMMUNITY)
Admission: RE | Admit: 2023-07-08 | Discharge: 2023-07-08 | Disposition: A | Discharge status: Home / Self Care | Payer: Medicare Other | Attending: Cardiology | Admitting: Cardiology

## 2023-07-08 ENCOUNTER — Other Ambulatory Visit: Payer: Self-pay

## 2023-07-08 ENCOUNTER — Other Ambulatory Visit: Payer: Self-pay | Admitting: Nurse Practitioner

## 2023-07-08 ENCOUNTER — Other Ambulatory Visit: Payer: Medicare Other

## 2023-07-08 DIAGNOSIS — E559 Vitamin D deficiency, unspecified: Secondary | ICD-10-CM

## 2023-07-08 DIAGNOSIS — I480 Paroxysmal atrial fibrillation: Secondary | ICD-10-CM | POA: Diagnosis not present

## 2023-07-08 DIAGNOSIS — I442 Atrioventricular block, complete: Secondary | ICD-10-CM | POA: Diagnosis not present

## 2023-07-08 DIAGNOSIS — Z9484 Stem cells transplant status: Secondary | ICD-10-CM | POA: Diagnosis not present

## 2023-07-08 DIAGNOSIS — R001 Bradycardia, unspecified: Secondary | ICD-10-CM | POA: Diagnosis not present

## 2023-07-08 DIAGNOSIS — Z45010 Encounter for checking and testing of cardiac pacemaker pulse generator [battery]: Secondary | ICD-10-CM | POA: Insufficient documentation

## 2023-07-08 HISTORY — PX: PPM GENERATOR CHANGEOUT: EP1233

## 2023-07-08 SURGERY — PPM GENERATOR CHANGEOUT

## 2023-07-08 MED ORDER — FENTANYL CITRATE (PF) 100 MCG/2ML IJ SOLN
INTRAMUSCULAR | Status: AC
  Filled 2023-07-08: qty 2

## 2023-07-08 MED ORDER — SODIUM CHLORIDE 0.9 % IV SOLN
80.0000 mg | INTRAVENOUS | Status: AC
  Administered 2023-07-08: 80 mg

## 2023-07-08 MED ORDER — FENTANYL CITRATE (PF) 100 MCG/2ML IJ SOLN
INTRAMUSCULAR | Status: DC | PRN
  Administered 2023-07-08 (×2): 25 ug via INTRAVENOUS

## 2023-07-08 MED ORDER — POVIDONE-IODINE 10 % EX SWAB
2.0000 | Freq: Once | CUTANEOUS | Status: AC
  Administered 2023-07-08: 2 via TOPICAL

## 2023-07-08 MED ORDER — LIDOCAINE HCL (PF) 1 % IJ SOLN
INTRAMUSCULAR | Status: DC | PRN
  Administered 2023-07-08: 60 mL

## 2023-07-08 MED ORDER — MIDAZOLAM HCL 5 MG/5ML IJ SOLN
INTRAMUSCULAR | Status: AC
  Filled 2023-07-08: qty 5

## 2023-07-08 MED ORDER — MIDAZOLAM HCL 5 MG/5ML IJ SOLN
INTRAMUSCULAR | Status: DC | PRN
  Administered 2023-07-08 (×2): 1 mg via INTRAVENOUS

## 2023-07-08 MED ORDER — ONDANSETRON HCL 4 MG/2ML IJ SOLN: 4.0000 mg | Freq: Four times a day (QID) | INTRAMUSCULAR | Status: DC | PRN

## 2023-07-08 MED ORDER — SODIUM CHLORIDE 0.9 % IV SOLN: INTRAVENOUS | Status: DC

## 2023-07-08 MED ORDER — CEFAZOLIN SODIUM-DEXTROSE 2-4 GM/100ML-% IV SOLN
INTRAVENOUS | Status: AC
  Filled 2023-07-08: qty 100

## 2023-07-08 MED ORDER — LIDOCAINE HCL 1 % IJ SOLN
INTRAMUSCULAR | Status: AC
  Filled 2023-07-08: qty 60

## 2023-07-08 MED ORDER — CHLORHEXIDINE GLUCONATE 4 % EX SOLN: 4.0000 | Freq: Once | CUTANEOUS | Status: DC

## 2023-07-08 MED ORDER — CEFAZOLIN SODIUM-DEXTROSE 2-4 GM/100ML-% IV SOLN
2.0000 g | INTRAVENOUS | Status: AC
  Administered 2023-07-08: 2 g via INTRAVENOUS

## 2023-07-08 MED ORDER — SODIUM CHLORIDE 0.9 % IV SOLN
INTRAVENOUS | Status: AC
  Filled 2023-07-08: qty 2

## 2023-07-08 MED ORDER — ACETAMINOPHEN 325 MG PO TABS: 325.0000 mg | ORAL_TABLET | ORAL | Status: DC | PRN

## 2023-07-08 SURGICAL SUPPLY — 6 items
CABLE SURGICAL S-101-97-12 (CABLE) ×1 IMPLANT
PACEMAKER ASSURITY DR-RF (Pacemaker) IMPLANT
PAD DEFIB RADIO PHYSIO CONN (PAD) ×1 IMPLANT
POUCH AIGIS-R ANTIBACT PPM (Mesh General) ×1 IMPLANT
POUCH AIGIS-R ANTIBACT PPM MED (Mesh General) IMPLANT
TRAY PACEMAKER INSERTION (PACKS) ×1 IMPLANT

## 2023-07-08 NOTE — H&P (Signed)
Electrophysiology Office Follow up Visit Note:     Date:  07/08/2023    ID:  Alyssa Dudley, [name] 03-31-1956, MRN [id number]   PCP:  Lucky Cowboy, MD             [facility] Cardiologist:  None  [facility] Electrophysiologist:  Lanier Prude, MD      Interval History:     Alyssa Dudley is a 67 y.o. female who presents for a follow up visit.    Last seen January 15, 2023 by [facility].  At that appointment she reported a recent stem cell transplant.  Her permanent pacemaker was implanted in April 2014. She has been doing well.  She has a cruise planned for [place] degrees.   Presents for generator change.     Objective Past medical, surgical, social and family history were reviewed.   ROS:   Please see the history of present illness.    All other systems reviewed and are negative.   EKGs/Labs/Other Studies Reviewed:     The following studies were reviewed today:   April 25, 2023 in clinic device interrogation personally reviewed Battery longevity less than 3 months to ERI Lead parameter stable Atrial pacing 23% Ventricular pacing greater than 99% 1 atrial high rate lasting 6 seconds (atrial tach) No programming changes made today         Physical Exam:     VS:  BP 171/95   Pulse 68   Ht 5' 4.5" (1.638 m)   Wt 190 lb (86.2 kg)   SpO2 99%   BMI 32.11 kg/m         Wt Readings from Last 3 Encounters:  04/25/23 190 lb (86.2 kg)  04/17/23 191 lb 12.8 oz (87 kg)  04/15/23 188 lb 12.8 oz (85.6 kg)      GEN:  Well nourished, well developed in no acute distress CARDIAC: RRR, no murmurs, rubs, gallops.  Pacemaker pocket well-healed RESPIRATORY:  Clear to auscultation without rales, wheezing or rhonchi          Assessment ASSESSMENT:     1. Mobitz type 2 second degree heart block   2. Cardiac pacemaker in situ   3. Paroxysmal atrial fibrillation (HCC)     PLAN:     In order of problems listed above:   #Symptomatic bradycardia #Permanent  pacemaker in situ Pacemaker functioning appropriately.  Approaching ERI.  Generator change was discussed in detail with the patient today including the risks and recovery and she wishes to proceed when her device triggers ERI.   #Paroxysmal atrial fibrillation Low burden.  Previously not on anticoagulation given a low burden and low CHA2DS2-VASc score.  Will continue using her pacemaker to monitor the burden.    Presents for generator replacement.  Risks, benefits, and alternatives to pulse generator replacement were discussed in detail today.  The patient understands that risks include but are not limited to bleeding, infection, pneumothorax, perforation, tamponade, vascular damage, renal failure, MI, stroke, death, damage to his existing leads, and lead dislodgement and wishes to proceed.  We will therefore schedule the procedure at the next available time.    Signed, Steffanie Dunn, MD, [facility], [facility] 07/08/2023 Electrophysiology [facility]

## 2023-07-08 NOTE — Discharge Instructions (Signed)
Implantable Cardiac Device Battery Change, Care After  This sheet gives you information about how to care for yourself after your procedure. Your health care provider may also give you more specific instructions. If you have problems or questions, contact your health care provider. What can I expect after the procedure? After your procedure, it is common to have:  Pain or soreness at the site where the cardiac device was inserted.  Swelling at the site where the cardiac device was inserted.  You should received an information card for your new device in 4-8 weeks. Follow these instructions at home: Incision care   Keep the incision clean and dry. ? Do not take baths, swim, or use a hot tub until after your wound check.  ? Do not shower for at least 7 days, or as directed by your health care provider. ? Pat the area dry with a clean towel. Do not rub the area. This may cause bleeding.  Follow instructions from your health care provider about how to take care of your incision. Make sure you: ? Leave stitches (sutures), skin glue, or adhesive strips in place. These skin closures may need to stay in place for 2 weeks or longer. If adhesive strip edges start to loosen and curl up, you may trim the loose edges. Do not remove adhesive strips completely unless your health care provider tells you to do that.  Check your incision area every day for signs of infection. Check for: ? More redness, swelling, or pain. ? More fluid or blood. ? Warmth. ? Pus or a bad smell. Activity  Do not lift anything that is heavier than 10 lb (4.5 kg) until your health care provider says it is okay to do so.  For the first week, or as long as told by your health care provider: ? Avoid lifting your affected arm higher than your shoulder. ? After 1 week, Be gentle when you move your arms over your head. It is okay to raise your arm to comb your hair. ? Avoid strenuous exercise.  Ask your health care provider  when it is okay to: ? Resume your normal activities. ? Return to work or school. ? Resume sexual activity. Eating and drinking  Eat a heart-healthy diet. This should include plenty of fresh fruits and vegetables, whole grains, low-fat dairy products, and lean protein like chicken and fish.  Limit alcohol intake to no more than 1 drink a day for non-pregnant women and 2 drinks a day for men. One drink equals 12 oz of beer, 5 oz of wine, or 1 oz of hard liquor.  Check ingredients and nutrition facts on packaged foods and beverages. Avoid the following types of food: ? Food that is high in salt (sodium). ? Food that is high in saturated fat, like full-fat dairy or red meat. ? Food that is high in trans fat, like fried food. ? Food and drinks that are high in sugar. Lifestyle  Do not use any products that contain nicotine or tobacco, such as cigarettes and e-cigarettes. If you need help quitting, ask your health care provider.  Take steps to manage and control your weight.  Once cleared, get regular exercise. Aim for 150 minutes of moderate-intensity exercise (such as walking or yoga) or 75 minutes of vigorous exercise (such as running or swimming) each week.  Manage other health problems, such as diabetes or high blood pressure. Ask your health care provider how you can manage these conditions. General instructions  Do   not drive for 24 hours after your procedure if you were given a medicine to help you relax (sedative).  Take over-the-counter and prescription medicines only as told by your health care provider.  Avoid putting pressure on the area where the cardiac device was placed.  If you need an MRI after your cardiac device has been placed, be sure to tell the health care provider who orders the MRI that you have a cardiac device.  Avoid close and prolonged exposure to electrical devices that have strong magnetic fields. These include: ? Cell phones. Avoid keeping them in a  pocket near the cardiac device, and try using the ear opposite the cardiac device. ? MP3 players. ? Household appliances, like microwaves. ? Metal detectors. ? Electric generators. ? High-tension wires.  Keep all follow-up visits as directed by your health care provider. This is important. Contact a health care provider if:  You have pain at the incision site that is not relieved by over-the-counter or prescription medicines.  You have any of these around your incision site or coming from it: ? More redness, swelling, or pain. ? Fluid or blood. ? Warmth to the touch. ? Pus or a bad smell.  You have a fever.  You feel brief, occasional palpitations, light-headedness, or any symptoms that you think might be related to your heart. Get help right away if:  You experience chest pain that is different from the pain at the cardiac device site.  You develop a red streak that extends above or below the incision site.  You experience shortness of breath.  You have palpitations or an irregular heartbeat.  You have light-headedness that does not go away quickly.  You faint or have dizzy spells.  Your pulse suddenly drops or increases rapidly and does not return to normal.  You begin to gain weight and your legs and ankles swell. Summary  After your procedure, it is common to have pain, soreness, and some swelling where the cardiac device was inserted.  Make sure to keep your incision clean and dry. Follow instructions from your health care provider about how to take care of your incision.  Check your incision every day for signs of infection, such as more pain or swelling, pus or a bad smell, warmth, or leaking fluid and blood.  Avoid strenuous exercise and lifting your left arm higher than your shoulder for 2 weeks, or as long as told by your health care provider. This information is not intended to replace advice given to you by your health care provider. Make sure you discuss  any questions you have with your health care provider.   

## 2023-07-08 NOTE — Progress Notes (Signed)
Patient was given discharge instructions. She verbalized understanding. 

## 2023-07-09 ENCOUNTER — Encounter (HOSPITAL_COMMUNITY): Payer: Self-pay | Admitting: Cardiology

## 2023-07-10 ENCOUNTER — Inpatient Hospital Stay: Payer: Medicare Other | Attending: Hematology

## 2023-07-10 ENCOUNTER — Inpatient Hospital Stay: Payer: Medicare Other

## 2023-07-10 VITALS — BP 139/77 | HR 66 | Temp 98.6°F | Resp 18 | Wt 180.5 lb

## 2023-07-10 DIAGNOSIS — C9001 Multiple myeloma in remission: Secondary | ICD-10-CM

## 2023-07-10 DIAGNOSIS — C9000 Multiple myeloma not having achieved remission: Secondary | ICD-10-CM

## 2023-07-10 DIAGNOSIS — Z5112 Encounter for antineoplastic immunotherapy: Secondary | ICD-10-CM | POA: Insufficient documentation

## 2023-07-10 LAB — CBC WITH DIFFERENTIAL (CANCER CENTER ONLY)
Abs Immature Granulocytes: 0 10*3/uL (ref 0.00–0.07)
Basophils Absolute: 0 10*3/uL (ref 0.0–0.1)
Basophils Relative: 1 %
Eosinophils Absolute: 0.1 10*3/uL (ref 0.0–0.5)
Eosinophils Relative: 2 %
HCT: 39.4 % (ref 36.0–46.0)
Hemoglobin: 13.1 g/dL (ref 12.0–15.0)
Immature Granulocytes: 0 %
Lymphocytes Relative: 52 %
Lymphs Abs: 2.6 10*3/uL (ref 0.7–4.0)
MCH: 30.2 pg (ref 26.0–34.0)
MCHC: 33.2 g/dL (ref 30.0–36.0)
MCV: 90.8 fL (ref 80.0–100.0)
Monocytes Absolute: 0.7 10*3/uL (ref 0.1–1.0)
Monocytes Relative: 14 %
Neutro Abs: 1.5 10*3/uL — ABNORMAL LOW (ref 1.7–7.7)
Neutrophils Relative %: 31 %
Platelet Count: 219 10*3/uL (ref 150–400)
RBC: 4.34 MIL/uL (ref 3.87–5.11)
RDW: 14.9 % (ref 11.5–15.5)
WBC Count: 4.9 10*3/uL (ref 4.0–10.5)
nRBC: 0 % (ref 0.0–0.2)

## 2023-07-10 LAB — COMPREHENSIVE METABOLIC PANEL
ALT: 16 U/L (ref 0–44)
AST: 15 U/L (ref 15–41)
Albumin: 4.3 g/dL (ref 3.5–5.0)
Alkaline Phosphatase: 126 U/L (ref 38–126)
Anion gap: 8 (ref 5–15)
BUN: 15 mg/dL (ref 8–23)
CO2: 27 mmol/L (ref 22–32)
Calcium: 10 mg/dL (ref 8.9–10.3)
Chloride: 107 mmol/L (ref 98–111)
Creatinine, Ser: 0.71 mg/dL (ref 0.44–1.00)
GFR, Estimated: >60 mL/min (ref >60)
Glucose, Bld: 99 mg/dL (ref 70–99)
Potassium: 4 mmol/L (ref 3.5–5.1)
Sodium: 142 mmol/L (ref 135–145)
Total Bilirubin: 0.7 mg/dL (ref 0.3–1.2)
Total Protein: 7.1 g/dL (ref 6.5–8.1)

## 2023-07-10 MED ORDER — DARATUMUMAB-HYALURONIDASE-FIHJ 1800-30000 MG-UT/15ML ~~LOC~~ SOLN
1800.0000 mg | Freq: Once | SUBCUTANEOUS | Status: AC
  Administered 2023-07-10: 1800 mg via SUBCUTANEOUS
  Filled 2023-07-10: qty 15

## 2023-07-10 NOTE — Patient Instructions (Signed)
[facility]  Discharge Instructions: Thank you for choosing [facility] to provide your oncology and hematology care.   If you have a lab appointment with the Cancer Center, please go directly to the Cancer Center and check in at the registration area.   Wear comfortable clothing and clothing appropriate for easy access to any Portacath or PICC line.   We strive to give you quality time with your provider. You may need to reschedule your appointment if you arrive late (15 or more minutes).  Arriving late affects you and other patients whose appointments are after yours.  Also, if you miss three or more appointments without notifying the office, you may be dismissed from the clinic at the provider's discretion.      For prescription refill requests, have your pharmacy contact our office and allow 72 hours for refills to be completed.    Today you received the following chemotherapy and/or immunotherapy agents: Darazalex Faspro.       To help prevent nausea and vomiting after your treatment, we encourage you to take your nausea medication as directed.  BELOW ARE SYMPTOMS THAT SHOULD BE REPORTED IMMEDIATELY: *FEVER GREATER THAN 100.4 F (38 C) OR HIGHER *CHILLS OR SWEATING *NAUSEA AND VOMITING THAT IS NOT CONTROLLED WITH YOUR NAUSEA MEDICATION *UNUSUAL SHORTNESS OF BREATH *UNUSUAL BRUISING OR BLEEDING *URINARY PROBLEMS (pain or burning when urinating, or frequent urination) *BOWEL PROBLEMS (unusual diarrhea, constipation, pain near the anus) TENDERNESS IN MOUTH AND THROAT WITH OR WITHOUT PRESENCE OF ULCERS (sore throat, sores in mouth, or a toothache) UNUSUAL RASH, SWELLING OR PAIN  UNUSUAL VAGINAL DISCHARGE OR ITCHING   Items with * indicate a potential emergency and should be followed up as soon as possible or go to the Emergency Department if any problems should occur.  Please show the CHEMOTHERAPY ALERT CARD or IMMUNOTHERAPY ALERT CARD  at check-in to the Emergency Department and triage nurse.  Should you have questions after your visit or need to cancel or reschedule your appointment, please contact [facility]  Dept: [phone]  and follow the prompts.  Office hours are 8:00 a.m. to 4:30 p.m. Monday - Friday. Please note that voicemails left after 4:00 p.m. may not be returned until the following business day.  We are closed weekends and major holidays. You have access to a nurse at all times for urgent questions. Please call the main number to the clinic Dept: [phone] and follow the prompts.   For any non-urgent questions, you may also contact your provider using MyChart. We now offer e-Visits for anyone 77 and older to request care online for non-urgent symptoms. For details visit [email or web address].   Also download the MyChart app! Go to the app store, search "MyChart", open the app, select [facility], and log in with your MyChart username and password.

## 2023-07-10 NOTE — Progress Notes (Signed)
Patient reports she took her premedications at 805am this morning.

## 2023-07-11 ENCOUNTER — Other Ambulatory Visit: Payer: Self-pay

## 2023-07-12 LAB — KAPPA/LAMBDA LIGHT CHAINS
Kappa free light chain: 7.8 mg/L (ref 3.3–19.4)
Kappa, lambda light chain ratio: 0.99 (ref 0.26–1.65)
Lambda free light chains: 7.9 mg/L (ref 5.7–26.3)

## 2023-07-14 LAB — MULTIPLE MYELOMA PANEL, SERUM
Albumin SerPl Elph-Mcnc: 3.8 g/dL (ref 2.9–4.4)
Albumin/Glob SerPl: 1.5 (ref 0.7–1.7)
Alpha 1: 0.2 g/dL (ref 0.0–0.4)
Alpha2 Glob SerPl Elph-Mcnc: 0.8 g/dL (ref 0.4–1.0)
B-Globulin SerPl Elph-Mcnc: 1 g/dL (ref 0.7–1.3)
Gamma Glob SerPl Elph-Mcnc: 0.6 g/dL (ref 0.4–1.8)
Globulin, Total: 2.7 g/dL (ref 2.2–3.9)
IgA: 13 mg/dL — ABNORMAL LOW (ref 87–352)
IgG (Immunoglobin G), Serum: 825 mg/dL (ref 586–1602)
IgM (Immunoglobulin M), Srm: 38 mg/dL (ref 26–217)
Total Protein ELP: 6.5 g/dL (ref 6.0–8.5)

## 2023-07-18 ENCOUNTER — Ambulatory Visit: Payer: BC Managed Care – PPO

## 2023-07-24 ENCOUNTER — Ambulatory Visit: Payer: Medicare Other

## 2023-07-29 ENCOUNTER — Other Ambulatory Visit: Payer: Self-pay

## 2023-07-29 MED ORDER — LENALIDOMIDE 10 MG PO CAPS: 10.0000 mg | ORAL_CAPSULE | Freq: Every day | ORAL | 0 refills | Status: DC

## 2023-07-31 ENCOUNTER — Ambulatory Visit: Payer: Medicare Other | Attending: Cardiology

## 2023-07-31 DIAGNOSIS — I441 Atrioventricular block, second degree: Secondary | ICD-10-CM | POA: Insufficient documentation

## 2023-07-31 LAB — CUP PACEART INCLINIC DEVICE CHECK
Battery Remaining Longevity: 106 mo
Battery Voltage: 3.02 V
Brady Statistic RA Percent Paced: 41 %
Brady Statistic RV Percent Paced: 99.88 %
Date Time Interrogation Session: 20241023122233
Implantable Lead Connection Status: 753985
Implantable Lead Connection Status: 753985
Implantable Lead Implant Date: 20140408
Implantable Lead Implant Date: 20140408
Implantable Lead Location: 753859
Implantable Lead Location: 753860
Implantable Lead Model: 1944
Implantable Lead Model: 1948
Implantable Pulse Generator Implant Date: 20240930
Lead Channel Impedance Value: 525 Ohm
Lead Channel Impedance Value: 600 Ohm
Lead Channel Pacing Threshold Amplitude: 0.5 V
Lead Channel Pacing Threshold Amplitude: 0.5 V
Lead Channel Pacing Threshold Amplitude: 0.75 V
Lead Channel Pacing Threshold Amplitude: 0.75 V
Lead Channel Pacing Threshold Pulse Width: 0.5 ms
Lead Channel Pacing Threshold Pulse Width: 0.5 ms
Lead Channel Pacing Threshold Pulse Width: 0.5 ms
Lead Channel Pacing Threshold Pulse Width: 0.5 ms
Lead Channel Sensing Intrinsic Amplitude: 4.2 mV
Lead Channel Sensing Intrinsic Amplitude: 9.1 mV
Lead Channel Setting Pacing Amplitude: 2.5 V
Lead Channel Setting Pacing Amplitude: 2.5 V
Lead Channel Setting Pacing Pulse Width: 0.5 ms
Lead Channel Setting Sensing Sensitivity: 4 mV
Pulse Gen Model: 2272
Pulse Gen Serial Number: 8210888

## 2023-07-31 NOTE — Patient Instructions (Signed)

## 2023-07-31 NOTE — Progress Notes (Signed)
Wound check appointment. Steri-strips removed. Wound without redness or edema. Incision edges approximated, wound well healed. Normal device function. Thresholds, sensing, and impedances consistent with implant measurements. RV output programmed @ 2.5V. Device programmed at chronic outputs, no new leads implanted. Histogram distribution appropriate for patient and level of activity. AT/AF burden <0.1%. No high ventricular rates noted. Patient educated about wound care, arm mobility. ROV in 3 months with implanting physician.

## 2023-08-04 NOTE — Assessment & Plan Note (Signed)
IgG lambda type, stage II, high risk -Diagnosed with CRAB criteria and bone marrow bx 05/01/2022, pathology confirmed plasma cell myeloma (60% plasma cells). Cytogenics and FISH c/w standard risk multiple myeloma except the deletion of 1P and gain of 1 q. which is a higher risk feature.  She is being treated as high risk -S/p 5 cycles induction DaraVRd starting 05/15/22 followed by autologous stem cell transplant 10/12/22 by Dr. Rosaria Ferries. Post transplant PET and marrow showed no evidence of disease -she received one cycle of DaraVRd to complete a total of 6 cycles -will continue dara and revlimid maintenance, with daratumumab injection 1800 mg every month for 2 years and Revlimid 10 mg on days 1-21 every 28 days, she started on March 18, 2023. -Will check her multiple myeloma labs and f/u every 2 months, M protein negative and light chain levels normal on 07/10/2023, she remains in remission.  -She will also follow-up with her transplant team

## 2023-08-05 ENCOUNTER — Encounter: Payer: Self-pay | Admitting: Hematology

## 2023-08-05 ENCOUNTER — Inpatient Hospital Stay: Payer: Medicare Other

## 2023-08-05 ENCOUNTER — Inpatient Hospital Stay (HOSPITAL_BASED_OUTPATIENT_CLINIC_OR_DEPARTMENT_OTHER): Payer: Medicare Other | Admitting: Hematology

## 2023-08-05 VITALS — BP 153/87 | HR 73 | Temp 98.1°F | Resp 17 | Wt 190.6 lb

## 2023-08-05 DIAGNOSIS — C9000 Multiple myeloma not having achieved remission: Secondary | ICD-10-CM

## 2023-08-05 DIAGNOSIS — Z5112 Encounter for antineoplastic immunotherapy: Secondary | ICD-10-CM | POA: Diagnosis not present

## 2023-08-05 DIAGNOSIS — C9001 Multiple myeloma in remission: Secondary | ICD-10-CM

## 2023-08-05 LAB — CBC WITH DIFFERENTIAL (CANCER CENTER ONLY)
Abs Immature Granulocytes: 0 10*3/uL (ref 0.00–0.07)
Basophils Absolute: 0 10*3/uL (ref 0.0–0.1)
Basophils Relative: 1 %
Eosinophils Absolute: 0.1 10*3/uL (ref 0.0–0.5)
Eosinophils Relative: 3 %
HCT: 38.2 % (ref 36.0–46.0)
Hemoglobin: 13.1 g/dL (ref 12.0–15.0)
Immature Granulocytes: 0 %
Lymphocytes Relative: 44 %
Lymphs Abs: 1.8 10*3/uL (ref 0.7–4.0)
MCH: 30.6 pg (ref 26.0–34.0)
MCHC: 34.3 g/dL (ref 30.0–36.0)
MCV: 89.3 fL (ref 80.0–100.0)
Monocytes Absolute: 0.6 10*3/uL (ref 0.1–1.0)
Monocytes Relative: 16 %
Neutro Abs: 1.5 10*3/uL — ABNORMAL LOW (ref 1.7–7.7)
Neutrophils Relative %: 36 %
Platelet Count: 243 10*3/uL (ref 150–400)
RBC: 4.28 MIL/uL (ref 3.87–5.11)
RDW: 15.1 % (ref 11.5–15.5)
WBC Count: 4 10*3/uL (ref 4.0–10.5)
nRBC: 0 % (ref 0.0–0.2)

## 2023-08-05 MED ORDER — DEXAMETHASONE 4 MG PO TABS: 20.0000 mg | ORAL_TABLET | ORAL | 2 refills | Status: DC

## 2023-08-05 MED ORDER — DARATUMUMAB-HYALURONIDASE-FIHJ 1800-30000 MG-UT/15ML ~~LOC~~ SOLN
1800.0000 mg | Freq: Once | SUBCUTANEOUS | Status: AC
  Administered 2023-08-05: 1800 mg via SUBCUTANEOUS
  Filled 2023-08-05: qty 15

## 2023-08-05 NOTE — Patient Instructions (Signed)
[facility]  Discharge Instructions: Thank you for choosing [facility] to provide your oncology and hematology care.   If you have a lab appointment with the Cancer Center, please go directly to the Cancer Center and check in at the registration area.   Wear comfortable clothing and clothing appropriate for easy access to any Portacath or PICC line.   We strive to give you quality time with your provider. You may need to reschedule your appointment if you arrive late (15 or more minutes).  Arriving late affects you and other patients whose appointments are after yours.  Also, if you miss three or more appointments without notifying the office, you may be dismissed from the clinic at the provider's discretion.      For prescription refill requests, have your pharmacy contact our office and allow 72 hours for refills to be completed.    Today you received the following chemotherapy and/or immunotherapy agents: Darazalex Faspro.       To help prevent nausea and vomiting after your treatment, we encourage you to take your nausea medication as directed.  BELOW ARE SYMPTOMS THAT SHOULD BE REPORTED IMMEDIATELY: *FEVER GREATER THAN 100.4 F (38 C) OR HIGHER *CHILLS OR SWEATING *NAUSEA AND VOMITING THAT IS NOT CONTROLLED WITH YOUR NAUSEA MEDICATION *UNUSUAL SHORTNESS OF BREATH *UNUSUAL BRUISING OR BLEEDING *URINARY PROBLEMS (pain or burning when urinating, or frequent urination) *BOWEL PROBLEMS (unusual diarrhea, constipation, pain near the anus) TENDERNESS IN MOUTH AND THROAT WITH OR WITHOUT PRESENCE OF ULCERS (sore throat, sores in mouth, or a toothache) UNUSUAL RASH, SWELLING OR PAIN  UNUSUAL VAGINAL DISCHARGE OR ITCHING   Items with * indicate a potential emergency and should be followed up as soon as possible or go to the Emergency Department if any problems should occur.  Please show the CHEMOTHERAPY ALERT CARD or IMMUNOTHERAPY ALERT CARD  at check-in to the Emergency Department and triage nurse.  Should you have questions after your visit or need to cancel or reschedule your appointment, please contact [facility]  Dept: [phone]  and follow the prompts.  Office hours are 8:00 a.m. to 4:30 p.m. Monday - Friday. Please note that voicemails left after 4:00 p.m. may not be returned until the following business day.  We are closed weekends and major holidays. You have access to a nurse at all times for urgent questions. Please call the main number to the clinic Dept: [phone] and follow the prompts.   For any non-urgent questions, you may also contact your provider using MyChart. We now offer e-Visits for anyone 77 and older to request care online for non-urgent symptoms. For details visit [email or web address].   Also download the MyChart app! Go to the app store, search "MyChart", open the app, select [facility], and log in with your MyChart username and password.

## 2023-08-05 NOTE — Progress Notes (Signed)
Patient took steroids for treatment at home at 0700.

## 2023-08-05 NOTE — Progress Notes (Signed)
[facility]   [phone] [phone]   Clinic Follow up Note   Patient Care Team: Lucky Cowboy, MD as PCP - General (Internal Medicine) Lanier Prude, MD as PCP - Electrophysiology (Cardiology) Mardella Layman, MD as Consulting Physician (Gastroenterology) Gelene Mink, OD as Referring Physician (Optometry) Pricilla Riffle, MD as Consulting Physician (Cardiology) Cindee Salt, MD as Consulting Physician (Orthopedic Surgery) Reece Levy, MD as Referring Physician (Hematology and Oncology) Malachy Mood, MD as Attending Physician (Hematology and Oncology)  Date of Service:  08/05/2023  CHIEF COMPLAINT: f/u of multiple myeloma  CURRENT THERAPY:  Maintenance daratumumab and Revlimid  Oncology History   Multiple myeloma (HCC)  IgG lambda type, stage II, high risk -Diagnosed with CRAB criteria and bone marrow bx 05/01/2022, pathology confirmed plasma cell myeloma (60% plasma cells). Cytogenics and FISH c/w standard risk multiple myeloma except the deletion of 1P and gain of 1 q. which is a higher risk feature.  She is being treated as high risk -S/p 5 cycles induction DaraVRd starting 05/15/22 followed by autologous stem cell transplant 10/12/22 by Dr. Rosaria Ferries. Post transplant PET and marrow showed no evidence of disease -she received one cycle of DaraVRd to complete a total of 6 cycles -will continue dara and revlimid maintenance, with daratumumab injection 1800 mg every month for 2 years and Revlimid 10 mg on days 1-21 every 28 days, she started on March 18, 2023. -Will check her multiple myeloma labs and f/u every 2 months, M protein negative and light chain levels normal on 07/10/2023, she remains in remission.  -She will also follow-up with her transplant team    Assessment and Plan    Multiple Myeloma In remission with normal blood counts, except for slightly low neutrophil count, which is expected from treatment. Kidney and liver  function normal. M protein negative. -Continue current treatment regimen with Revlimid 3 weeks on, 1 week off. -she well completed 2 years of daratumumab at the end of July 2025  Weight Gain Noted weight gain after recent vacation. -Encouraged to exercise and maintain a healthy diet.  Vaccinations Received flu shot recently, unsure about COVID-19 vaccination status. -Verify COVID-19 vaccination status and administer if not yet received.  Insurance Patient discovered an outside Brewing technologist that covers cost of chemotherapy pills. -Advise patient to submit form to insurance for reimbursement.   Plan -Lab reviewed, adequate for treatment, will proceed daratumumab injection today and continue every month -Continue Revlimid -Follow-up in a month     SUMMARY OF ONCOLOGIC HISTORY: Oncology History Overview Note   Cancer Staging  Multiple myeloma (HCC) Staging form: Plasma Cell Myeloma and Plasma Cell Disorders, AJCC 8th Edition - Clinical stage from 05/01/2022: RISS Stage II (Beta-2-microglobulin (mg/L): 3.2, Albumin (g/dL): 3.6, ISS: Stage I, High-risk cytogenetics: Absent, LDH: Elevated) - Signed by Malachy Mood, MD on 06/03/2022 Stage prefix: Initial diagnosis Beta 2 microglobulin range (mg/L): Less than 3.5 Albumin range (g/dL): Greater than or equal to 3.5 Cytogenetics: 1p deletion     Multiple myeloma (HCC)  05/01/2022 Cancer Staging   Staging form: Plasma Cell Myeloma and Plasma Cell Disorders, AJCC 8th Edition - Clinical stage from 05/01/2022: RISS Stage II (Beta-2-microglobulin (mg/L): 3.2, Albumin (g/dL): 3.6, ISS: Stage I, High-risk cytogenetics: Absent, LDH: Elevated) - Signed by Malachy Mood, MD on 06/03/2022 Stage prefix: Initial diagnosis Beta 2 microglobulin range (mg/L): Less than 3.5 Albumin range (g/dL): Greater than or equal to 3.5 Cytogenetics: 1p deletion   05/08/2022 Initial Diagnosis   Multiple  myeloma (HCC)   05/14/2022 - 02/28/2023 Chemotherapy   Patient is  on Treatment Plan : MYELOMA NEWLY DIAGNOSED TRANSPLANT CANDIDATE DaraVRd (Daratumumab IV) q21d x 6 Cycles (Induction/Consolidation)     05/15/2022 - 05/28/2022 Chemotherapy   Patient is on Treatment Plan : MYELOMA NEWLY DIAGNOSED TRANSPLANT CANDIDATE DaraVRd (Daratumumab IV) q21d x 6 Cycles (Induction/Consolidation)     03/18/2023 -  Chemotherapy   Patient is on Treatment Plan : MYELOMA Daratumumab SQ q28d        Discussed the use of AI scribe software for clinical note transcription with the patient, who gave verbal consent to proceed.  History of Present Illness   A 67 year old patient with a history of multiple myeloma presents for a routine follow-up. The patient reports feeling well with no new symptoms or complaints. The patient recently returned from a 10-day cruise during which she gained some weight. The patient is currently on a treatment regimen for her multiple myeloma, which includes Revlimid. The patient's blood counts are within normal limits except for a slightly low neutrophil count, which is expected due to the treatment. The patient has received her flu shot and is up-to-date with her vaccinations. The patient also mentions an insurance policy that covers the cost of her chemotherapy pills.         All other systems were reviewed with the patient and are negative.  MEDICAL HISTORY:  Past Medical History:  Diagnosis Date   Allergy    once a year    Hyperlipidemia    borderline- no meds    Pacemaker    Paroxysmal atrial fibrillation (HCC) 6/15   detected on ppm interrogation   PONV (postoperative nausea and vomiting)    has vomitted in past   Second degree Mobitz II AV block    Vitamin D deficiency     SURGICAL HISTORY: Past Surgical History:  Procedure Laterality Date   BREAST CYST EXCISION  1980   COLONOSCOPY     EXTRACORPOREAL SHOCK WAVE LITHOTRIPSY Right 12/28/2019   Procedure: RIGHT EXTRACORPOREAL SHOCK WAVE LITHOTRIPSY (ESWL);  Surgeon: Heloise Purpura, MD;   Location: [facility];  Service: Urology;  Laterality: Right;   PERMANENT PACEMAKER INSERTION N/A 01/13/2013   SJM [employer] DR RF implanted by Dr Johney Frame for mobitz II AV block   PLANTAR FASCIECTOMY     POLYPECTOMY     PPM GENERATOR CHANGEOUT N/A 07/08/2023   Procedure: PPM GENERATOR CHANGEOUT;  Surgeon: Lanier Prude, MD;  Location: [facility] INVASIVE CV LAB;  Service: Cardiovascular;  Laterality: N/A;   WRIST SURGERY Right 2011   removed cyst    I have reviewed the social history and family history with the patient and they are unchanged from previous note.  ALLERGIES:  has No Known Allergies.  MEDICATIONS:  Current Outpatient Medications  Medication Sig Dispense Refill   acyclovir (ZOVIRAX) 400 MG tablet Take 1 tablet (400 mg total) by mouth 2 (two) times daily. (Patient taking differently: Take 400 mg by mouth daily.) 60 tablet 5   aspirin 81 MG chewable tablet Chew 81 mg by mouth daily.     atenolol (TENORMIN) 50 MG tablet TAKE 1 TABLET BY MOUTH DAILY FOR BLOOD PRESSURE 90 tablet 3   Calcium Carb-Cholecalciferol (CALCIUM + VITAMIN D3 PO) Take 1 tablet by mouth daily.     dexamethasone (DECADRON) 4 MG tablet Take 5 tablets (20 mg total) by mouth every 30 (thirty) days. 1-2 hours before chemo injections every 4 weeks 20 tablet 2   lenalidomide (  REVLIMID) 10 MG capsule Take 1 capsule (10 mg total) by mouth daily. Take 1 capsules (10 mg total) by mouth daily for 21 days with 7 days off for a 28 day cycle.  Siri Cole # [id number]     Date Obtained 07/29/2023 28 capsule 0   Multiple Vitamin (MULTIVITAMIN) tablet Take 1 tablet by mouth daily.     triamcinolone cream (KENALOG) 0.1 % APPLY EXTERNALLY TO THE AFFECTED AREA THREE TIMES DAILY AS NEEDED FOR RASH 30 g 2   Vitamin D, Ergocalciferol, (DRISDOL) 1.25 MG (50000 UNIT) CAPS capsule Take 1 capsule (50,000 Units total) by mouth every 7 (seven) days. 13 capsule 2   Current Facility-Administered Medications  Medication Dose Route  Frequency Provider Last Rate Last Admin   0.9 %  sodium chloride infusion  500 mL Intravenous Once Pyrtle, Carie Caddy, MD        PHYSICAL EXAMINATION: ECOG PERFORMANCE STATUS: 0 - Asymptomatic  Vitals:   08/05/23 0906  BP: (!) 153/87  Pulse: 73  Resp: 17  Temp: 98.1 F (36.7 C)  SpO2: 100%   Wt Readings from Last 3 Encounters:  08/05/23 190 lb 9.6 oz (86.5 kg)  07/10/23 180 lb 8 oz (81.9 kg)  07/08/23 180 lb (81.6 kg)     GENERAL:alert, no distress and comfortable SKIN: skin color, texture, turgor are normal, no rashes or significant lesions EYES: normal, Conjunctiva are pink and non-injected, sclera clear NECK: supple, thyroid normal size, non-tender, without nodularity LYMPH:  no palpable lymphadenopathy in the cervical, axillary  LUNGS: clear to auscultation and percussion with normal breathing effort HEART: regular rate & rhythm and no murmurs and no lower extremity edema ABDOMEN:abdomen soft, non-tender and normal bowel sounds Musculoskeletal:no cyanosis of digits and no clubbing  NEURO: alert & oriented x 3 with fluent speech, no focal motor/sensory deficits    LABORATORY DATA:  I have reviewed the data as listed    Latest Ref Rng & Units 08/05/2023    8:50 AM 07/10/2023    8:50 AM 07/01/2023    2:24 PM  CBC  WBC 4.0 - 10.5 K/uL 4.0  4.9  4.3   Hemoglobin 12.0 - 15.0 g/dL 65.7  84.6  96.2   Hematocrit 36.0 - 46.0 % 38.2  39.4  40.6   Platelets 150 - 400 K/uL 243  219  219         Latest Ref Rng & Units 07/10/2023    8:50 AM 07/01/2023    2:24 PM 04/17/2023    9:50 AM  CMP  Glucose 70 - 99 mg/dL 99  85  80   BUN 8 - 23 mg/dL 15  12  15    Creatinine 0.44 - 1.00 mg/dL 9.52  8.41  3.24   Sodium 135 - 145 mmol/L 142  146  143   Potassium 3.5 - 5.1 mmol/L 4.0  4.2  4.3   Chloride 98 - 111 mmol/L 107  105  105   CO2 22 - 32 mmol/L 27  26  27    Calcium 8.9 - 10.3 mg/dL 40.1  9.6  9.7   Total Protein 6.5 - 8.1 g/dL 7.1   6.3   Total Bilirubin 0.3 - 1.2 mg/dL 0.7    0.5   Alkaline Phos 38 - 126 U/L 126     AST 15 - 41 U/L 15   15   ALT 0 - 44 U/L 16   15       RADIOGRAPHIC STUDIES: I have personally  reviewed the radiological images as listed and agreed with the findings in the report. No results found.    Orders Placed This Encounter  Procedures   CBC with Differential (Cancer Center Only)    Standing Status:   Future    Standing Expiration Date:   11/24/2024   All questions were answered. The patient knows to call the clinic with any problems, questions or concerns. No barriers to learning was detected. The total time spent in the appointment was 25 minutes.     Malachy Mood, MD 08/05/2023

## 2023-08-12 ENCOUNTER — Ambulatory Visit: Payer: BC Managed Care – PPO

## 2023-08-19 ENCOUNTER — Ambulatory Visit: Payer: BC Managed Care – PPO

## 2023-08-21 ENCOUNTER — Other Ambulatory Visit: Payer: Self-pay

## 2023-08-21 MED ORDER — LENALIDOMIDE 10 MG PO CAPS: 10.0000 mg | ORAL_CAPSULE | Freq: Every day | ORAL | 0 refills | Status: DC

## 2023-09-02 ENCOUNTER — Ambulatory Visit: Payer: Medicare Other

## 2023-09-02 ENCOUNTER — Other Ambulatory Visit: Payer: Medicare Other

## 2023-09-03 ENCOUNTER — Inpatient Hospital Stay: Payer: Medicare Other | Attending: Hematology

## 2023-09-03 ENCOUNTER — Inpatient Hospital Stay: Payer: Medicare Other

## 2023-09-03 VITALS — BP 152/88 | HR 74 | Temp 98.7°F | Resp 16 | Ht 63.0 in | Wt 189.0 lb

## 2023-09-03 DIAGNOSIS — C9001 Multiple myeloma in remission: Secondary | ICD-10-CM

## 2023-09-03 DIAGNOSIS — Z5112 Encounter for antineoplastic immunotherapy: Secondary | ICD-10-CM | POA: Insufficient documentation

## 2023-09-03 DIAGNOSIS — C9000 Multiple myeloma not having achieved remission: Secondary | ICD-10-CM

## 2023-09-03 LAB — CBC WITH DIFFERENTIAL (CANCER CENTER ONLY)
Abs Immature Granulocytes: 0 10*3/uL (ref 0.00–0.07)
Basophils Absolute: 0 10*3/uL (ref 0.0–0.1)
Basophils Relative: 1 %
Eosinophils Absolute: 0.1 10*3/uL (ref 0.0–0.5)
Eosinophils Relative: 1 %
HCT: 38.4 % (ref 36.0–46.0)
Hemoglobin: 13 g/dL (ref 12.0–15.0)
Immature Granulocytes: 0 %
Lymphocytes Relative: 37 %
Lymphs Abs: 1.7 10*3/uL (ref 0.7–4.0)
MCH: 30.6 pg (ref 26.0–34.0)
MCHC: 33.9 g/dL (ref 30.0–36.0)
MCV: 90.4 fL (ref 80.0–100.0)
Monocytes Absolute: 1 10*3/uL (ref 0.1–1.0)
Monocytes Relative: 23 %
Neutro Abs: 1.7 10*3/uL (ref 1.7–7.7)
Neutrophils Relative %: 38 %
Platelet Count: 182 10*3/uL (ref 150–400)
RBC: 4.25 MIL/uL (ref 3.87–5.11)
RDW: 15.8 % — ABNORMAL HIGH (ref 11.5–15.5)
WBC Count: 4.5 10*3/uL (ref 4.0–10.5)
nRBC: 0 % (ref 0.0–0.2)

## 2023-09-03 LAB — COMPREHENSIVE METABOLIC PANEL
ALT: 14 U/L (ref 0–44)
AST: 14 U/L — ABNORMAL LOW (ref 15–41)
Albumin: 4.2 g/dL (ref 3.5–5.0)
Alkaline Phosphatase: 116 U/L (ref 38–126)
Anion gap: 5 (ref 5–15)
BUN: 10 mg/dL (ref 8–23)
CO2: 29 mmol/L (ref 22–32)
Calcium: 9.4 mg/dL (ref 8.9–10.3)
Chloride: 107 mmol/L (ref 98–111)
Creatinine, Ser: 0.76 mg/dL (ref 0.44–1.00)
GFR, Estimated: >60 mL/min (ref >60)
Glucose, Bld: 116 mg/dL — ABNORMAL HIGH (ref 70–99)
Potassium: 3.8 mmol/L (ref 3.5–5.1)
Sodium: 141 mmol/L (ref 135–145)
Total Bilirubin: 0.6 mg/dL (ref <1.2)
Total Protein: 6.6 g/dL (ref 6.5–8.1)

## 2023-09-03 MED ORDER — DARATUMUMAB-HYALURONIDASE-FIHJ 1800-30000 MG-UT/15ML ~~LOC~~ SOLN
1800.0000 mg | Freq: Once | SUBCUTANEOUS | Status: AC
Start: 2023-09-03 — End: 2023-09-03
  Administered 2023-09-03: 1800 mg via SUBCUTANEOUS
  Filled 2023-09-03: qty 15

## 2023-09-03 NOTE — Progress Notes (Signed)
Pt states she takes her premedications at home

## 2023-09-03 NOTE — Patient Instructions (Signed)

## 2023-09-04 LAB — KAPPA/LAMBDA LIGHT CHAINS
Kappa free light chain: 6.2 mg/L (ref 3.3–19.4)
Kappa, lambda light chain ratio: 0.69 (ref 0.26–1.65)
Lambda free light chains: 9 mg/L (ref 5.7–26.3)

## 2023-09-07 ENCOUNTER — Ambulatory Visit (HOSPITAL_COMMUNITY)
Admission: EM | Admit: 2023-09-07 | Discharge: 2023-09-07 | Disposition: A | Discharge status: Home / Self Care | Payer: Medicare Other | Attending: Family Medicine | Admitting: Family Medicine

## 2023-09-07 ENCOUNTER — Ambulatory Visit (INDEPENDENT_AMBULATORY_CARE_PROVIDER_SITE_OTHER): Payer: Medicare Other

## 2023-09-07 ENCOUNTER — Encounter (HOSPITAL_COMMUNITY): Payer: Self-pay | Admitting: Emergency Medicine

## 2023-09-07 DIAGNOSIS — J069 Acute upper respiratory infection, unspecified: Secondary | ICD-10-CM | POA: Diagnosis present

## 2023-09-07 DIAGNOSIS — R051 Acute cough: Secondary | ICD-10-CM

## 2023-09-07 MED ORDER — AMOXICILLIN-POT CLAVULANATE 875-125 MG PO TABS
1.0000 | ORAL_TABLET | Freq: Two times a day (BID) | ORAL | 0 refills | Status: DC
Start: 2023-09-07 — End: 2023-09-24

## 2023-09-07 MED ORDER — BENZONATATE 100 MG PO CAPS: 100.0000 mg | ORAL_CAPSULE | Freq: Three times a day (TID) | ORAL | 0 refills | Status: DC | PRN

## 2023-09-07 NOTE — Discharge Instructions (Signed)
Chest x-ray is clear.  I have prescribed you an antibiotic and a cough medication.  COVID test is pending.  Please follow-up if any symptoms persist or worsen.

## 2023-09-07 NOTE — ED Triage Notes (Signed)
Pt reports a productive cough and chest congestion x 3/4 days. States she is currently undergoing chemo treatments and wants to r/o PNA. Has been taking Mucinex with minimal relief.

## 2023-09-07 NOTE — ED Provider Notes (Signed)
[facility]    CSN: [id number] Arrival date & time: 09/07/23  1050      History   Chief Complaint Chief Complaint  Patient presents with   Cough   chest congestion    HPI Alyssa Dudley is a 67 y.o. female.   Patient presents with productive cough, runny nose, feelings of chest congestion that started about 5 days ago.  Denies any fever or chills.  Denies any known sick contacts.  Patient has been taking Mucinex with minimal improvement.  She denies any chest pain or shortness of breath.  Denies history of asthma or COPD.  Patient denies that she smokes cigarettes.  She is concerned that she needs a chest x-ray given that she is currently undergoing chemo treatment.   Cough   Past Medical History:  Diagnosis Date   Allergy    once a year    Hyperlipidemia    borderline- no meds    Pacemaker    Paroxysmal atrial fibrillation (HCC) 6/15   detected on ppm interrogation   PONV (postoperative nausea and vomiting)    has vomitted in past   Second degree Mobitz II AV block    Vitamin D deficiency     Patient Active Problem List   Diagnosis Date Noted   Multiple myeloma (HCC) 05/08/2022   History of renal calculi 05/17/2021   Osteoarthritis of multiple joints 04/05/2021   Salivary stone 04/05/2021   Meralgia paraesthetica, left 02/13/2018   Plantar fasciitis of left foot 02/13/2018   Lateral epicondylitis of left elbow 02/13/2018   Overweight (BMI 25.0-29.9) 02/25/2015   HTN (hypertension) 08/17/2014   Abnormal glucose 08/17/2014   Medication management 08/17/2014   Atrial fibrillation (HCC) 06/07/2014   Hyperlipidemia    Vitamin D deficiency    Mobitz type 2 second degree and high-grade heart block 01/07/2013   Family history of malignant neoplasm of gastrointestinal tract 10/15/2011   Benign neoplasm of colon 10/15/2011   GERD 01/10/2008   Constipation 01/10/2008   History of peptic ulcer disease 01/10/2008    Past Surgical History:  Procedure  Laterality Date   BREAST CYST EXCISION  1980   COLONOSCOPY     EXTRACORPOREAL SHOCK WAVE LITHOTRIPSY Right 12/28/2019   Procedure: RIGHT EXTRACORPOREAL SHOCK WAVE LITHOTRIPSY (ESWL);  Surgeon: Heloise Purpura, MD;  Location: [facility];  Service: Urology;  Laterality: Right;   PERMANENT PACEMAKER INSERTION N/A 01/13/2013   SJM [employer] DR RF implanted by Dr Johney Frame for mobitz II AV block   PLANTAR FASCIECTOMY     POLYPECTOMY     PPM GENERATOR CHANGEOUT N/A 07/08/2023   Procedure: PPM GENERATOR CHANGEOUT;  Surgeon: Lanier Prude, MD;  Location: [facility];  Service: Cardiovascular;  Laterality: N/A;   WRIST SURGERY Right 2011   removed cyst    OB History   No obstetric history on file.      Home Medications    Prior to Admission medications   Medication Sig Start Date End Date Taking? Authorizing Provider  amoxicillin-clavulanate (AUGMENTIN) 875-125 MG tablet Take 1 tablet by mouth every 12 (twelve) hours. 09/07/23  Yes Kolbee Bogusz, Rolly Salter E, FNP  benzonatate (TESSALON) 100 MG capsule Take 1 capsule (100 mg total) by mouth every 8 (eight) hours as needed for cough. 09/07/23  Yes Annebelle Bostic, Acie Fredrickson, FNP  acyclovir (ZOVIRAX) 400 MG tablet Take 1 tablet (400 mg total) by mouth 2 (two) times daily. Patient taking differently: Take 400 mg by mouth daily. 03/07/23   Mosetta Putt,  Terrace Arabia, MD  aspirin 81 MG chewable tablet Chew 81 mg by mouth daily.    [provider]  atenolol (TENORMIN) 50 MG tablet TAKE 1 TABLET BY MOUTH DAILY FOR BLOOD PRESSURE 10/31/22   Adela Glimpse, NP  Calcium Carb-Cholecalciferol (CALCIUM + VITAMIN D3 PO) Take 1 tablet by mouth daily.    [provider]  dexamethasone (DECADRON) 4 MG tablet Take 5 tablets (20 mg total) by mouth every 30 (thirty) days. 1-2 hours before chemo injections every 4 weeks 08/05/23   Malachy Mood, MD  lenalidomide (REVLIMID) 10 MG capsule Take 1 capsule (10 mg total) by mouth daily. Take 1 capsules (10 mg total) by mouth daily  for 21 days with 7 days off for a 28 day cycle.  [employer] Auth # [id number]     Date Obtained 08/21/2023 08/21/23   Malachy Mood, MD  Multiple Vitamin (MULTIVITAMIN) tablet Take 1 tablet by mouth daily.    [provider]  triamcinolone cream (KENALOG) 0.1 % APPLY EXTERNALLY TO THE AFFECTED AREA THREE TIMES DAILY AS NEEDED FOR RASH 06/18/22   Malachy Mood, MD  Vitamin D, Ergocalciferol, (DRISDOL) 1.25 MG (50000 UNIT) CAPS capsule Take 1 capsule (50,000 Units total) by mouth every 7 (seven) days. 07/08/23   Raynelle Dick, NP    Family History Family History  Problem Relation Age of Onset   Cervical cancer Mother    Hypertension Mother    Diabetes Mother    Heart disease Father    Hyperlipidemia Father    Colon cancer Sister 65   Anemia Sister    Leukemia Sister    Prostate cancer Brother 72   HIV/AIDS Brother    Multiple myeloma Son 67   Hypertension Son    Kidney disease Son    Multiple myeloma Maternal Aunt    Colon polyps Neg Hx    Rectal cancer Neg Hx    Stomach cancer Neg Hx    Liver disease Neg Hx    Esophageal cancer Neg Hx     Social History Social History   Tobacco Use   Smoking status: Never   Smokeless tobacco: Never  Vaping Use   Vaping status: Never Used  Substance Use Topics   Alcohol use: No   Drug use: No     Allergies   Patient has no known allergies.   Review of Systems Review of Systems Per HPI  Physical Exam Triage Vital Signs ED Triage Vitals  Encounter Vitals Group     BP 09/07/23 1132 (!) 160/91     Systolic BP Percentile --      Diastolic BP Percentile --      Pulse Rate 09/07/23 1132 69     Resp 09/07/23 1132 17     Temp 09/07/23 1132 98.1 F (36.7 C)     Temp Source 09/07/23 1132 Oral     SpO2 09/07/23 1132 96 %     Weight --      Height --      Head Circumference --      Peak Flow --      Pain Score 09/07/23 1130 8     Pain Loc --      Pain Education --      Exclude from Growth Chart --    No data found.  Updated  Vital Signs BP (!) 160/91 (BP Location: Left Arm)   Pulse 69   Temp 98.1 F (36.7 C) (Oral)   Resp 17   SpO2 96%  Visual Acuity Right Eye Distance:   Left Eye Distance:   Bilateral Distance:    Right Eye Near:   Left Eye Near:    Bilateral Near:     Physical Exam Constitutional:      General: She is not in acute distress.    Appearance: Normal appearance. She is not toxic-appearing or diaphoretic.  HENT:     Head: Normocephalic and atraumatic.     Right Ear: Tympanic membrane and ear canal normal.     Left Ear: Tympanic membrane and ear canal normal.     Nose: Congestion present.     Mouth/Throat:     Mouth: Mucous membranes are moist.     Pharynx: No posterior oropharyngeal erythema.  Eyes:     Extraocular Movements: Extraocular movements intact.     Conjunctiva/sclera: Conjunctivae normal.     Pupils: Pupils are equal, round, and reactive to light.  Cardiovascular:     Rate and Rhythm: Normal rate and regular rhythm.     Pulses: Normal pulses.     Heart sounds: Normal heart sounds.  Pulmonary:     Effort: Pulmonary effort is normal. No respiratory distress.     Breath sounds: No stridor. Rhonchi present. No wheezing or rales.     Comments: Rhonchi noted bilaterally. Abdominal:     General: Abdomen is flat. Bowel sounds are normal.     Palpations: Abdomen is soft.  Musculoskeletal:        General: Normal range of motion.     Cervical back: Normal range of motion.  Skin:    General: Skin is warm and dry.  Neurological:     General: No focal deficit present.     Mental Status: She is alert and oriented to person, place, and time. Mental status is at baseline.  Psychiatric:        Mood and Affect: Mood normal.        Behavior: Behavior normal.      UC Treatments / Results  Labs (all labs ordered are listed, but only abnormal results are displayed) Labs Reviewed  SARS CORONAVIRUS 2 (TAT 6-24 HRS)    EKG   Radiology DG Chest 2 View  Result Date:  09/07/2023 CLINICAL DATA:  Productive cough for several days. EXAM: CHEST - 2 VIEW COMPARISON:  April 15, 2023. FINDINGS: The heart size and mediastinal contours are within normal limits. Left-sided pacemaker is unchanged. Both lungs are clear. The visualized skeletal structures are unremarkable. IMPRESSION: No active cardiopulmonary disease. Electronically Signed   By: Lupita Raider M.D.   On: 09/07/2023 12:14    Procedures Procedures (including critical care time)  Medications Ordered in UC Medications - No data to display  Initial Impression / Assessment and Plan / UC Course  I have reviewed the triage vital signs and the nursing notes.  Pertinent labs & imaging results that were available during my care of the patient were reviewed by me and considered in my medical decision making (see chart for details).     Chest x-ray completed that was negative for any acute cardiopulmonary process.  Although, patient does have rhonchi noted on chest auscultation which is concerning especially given patient is undergoing chemo treatment. She is not in any acute distress and oxygen is normal which is reassuring.  Suspect possible viral illness causing bronchitis.  Will opt to treat with antibiotic therapy today given adventitious lung sounds to err on the side of caution.  COVID test pending.  Benzonatate prescribed to  take as needed for cough.  Advised supportive care, fluids, rest and strict follow-up if any symptoms persist or worsen.  Patient verbalized understanding and was agreeable with plan. Final Clinical Impressions(s) / UC Diagnoses   Final diagnoses:  Acute upper respiratory infection  Acute cough     Discharge Instructions      Chest x-ray is clear.  I have prescribed you an antibiotic and a cough medication.  COVID test is pending.  Please follow-up if any symptoms persist or worsen.     ED Prescriptions     Medication Sig Dispense Auth. Provider   amoxicillin-clavulanate  (AUGMENTIN) 875-125 MG tablet Take 1 tablet by mouth every 12 (twelve) hours. 14 tablet Vamo, Parshall E, [place]   benzonatate (TESSALON) 100 MG capsule Take 1 capsule (100 mg total) by mouth every 8 (eight) hours as needed for cough. 21 capsule Benavides, Acie Fredrickson, [place]      PDMP not reviewed this encounter.   Gustavus Bryant, [place] 09/07/23 1254

## 2023-09-08 LAB — MULTIPLE MYELOMA PANEL, SERUM
Albumin SerPl Elph-Mcnc: 3.8 g/dL (ref 2.9–4.4)
Albumin/Glob SerPl: 1.6 (ref 0.7–1.7)
Alpha 1: 0.2 g/dL (ref 0.0–0.4)
Alpha2 Glob SerPl Elph-Mcnc: 0.7 g/dL (ref 0.4–1.0)
B-Globulin SerPl Elph-Mcnc: 0.9 g/dL (ref 0.7–1.3)
Gamma Glob SerPl Elph-Mcnc: 0.6 g/dL (ref 0.4–1.8)
Globulin, Total: 2.4 g/dL (ref 2.2–3.9)
IgA: 15 mg/dL — ABNORMAL LOW (ref 87–352)
IgG (Immunoglobin G), Serum: 720 mg/dL (ref 586–1602)
IgM (Immunoglobulin M), Srm: 21 mg/dL — ABNORMAL LOW (ref 26–217)
Total Protein ELP: 6.2 g/dL (ref 6.0–8.5)

## 2023-09-08 LAB — SARS CORONAVIRUS 2 (TAT 6-24 HRS): SARS Coronavirus 2: NEGATIVE

## 2023-09-19 ENCOUNTER — Ambulatory Visit: Payer: BC Managed Care – PPO

## 2023-09-23 NOTE — Progress Notes (Signed)
Assessment and Plan:  Alyssa Dudley was seen today for flank pain.  Diagnoses and all orders for this visit:  Acute midline low back pain without sciatica Use heating pad Meloxicam 7.5 mg daily as needed Refer to orthopedics for further evaluation Will notify of Xray results once obtained -     DG Thoracic Spine W/Swimmers; Future -     DG Lumbar Spine Complete; Future -     meloxicam (MOBIC) 7.5 MG tablet; Take 1 tablet (7.5 mg total) by mouth daily. -     Ambulatory referral to Orthopedic Surgery  Chronic pain of both shoulders Meloxicam and heat as needed -     Ambulatory referral to Orthopedic Surgery  Essential hypertension - continue medications- Atenolol 50 mg every day , DASH diet, exercise and monitor at home. Call if greater than 130/80.   Multiple Myeloma in remission(HCC) Continue to follow with Dr Mosetta Putt    Further disposition pending results of labs. Discussed med's effects and SE's.   Over 30 minutes of exam, counseling, chart review, and critical decision making was performed.   Future Appointments  Date Time Provider Department Center  10/08/2023  9:15 AM Lanier Prude, MD [facility]  10/10/2023  7:00 AM [facility] DEVICE REMOTES [facility]  10/28/2023  9:30 AM Raynelle Dick, NP [facility] None  01/09/2024  7:00 AM [facility] DEVICE REMOTES [facility]  04/09/2024  7:00 AM [facility] DEVICE REMOTES [facility]  04/16/2024  9:00 AM Raynelle Dick, NP [facility] None  07/09/2024  7:00 AM [facility] DEVICE REMOTES [facility]  10/09/2024  7:00 AM [facility] DEVICE REMOTES [facility]  01/08/2025  7:00 AM [facility] DEVICE REMOTES [facility]  04/08/2025  7:00 AM [facility] DEVICE REMOTES [facility]  07/08/2025  7:00 AM [facility] DEVICE REMOTES [facility]     ------------------------------------------------------------------------------------------------------------------   HPI BP 128/64   Pulse 77   Temp 97.9 F (36.6 C)   Wt 188 lb 3.2 oz (85.4 kg)   SpO2 99%   BMI 33.34 kg/m   67 y.o.female presents for complaints of right lower back pain, flank pain which has been occurring intermittently x 3 months. . No burning on urination, fevers or difficulty urinating.  Has not tried using any over the counter remedies. Lower back pain right side but can be across entire low back.   Decreased ROM and pain in both shoulders with movement. Several years ago she had an injection in right shoulder which helped dramatically  She is currently on atenolol 50 mg every day. Last Bp was: BP Readings from Last 3 Encounters:  09/24/23 128/64  09/07/23 (!) 160/91  09/03/23 (!) 152/88   BMI is Body mass index is 33.34 kg/m., she has been working on diet and exercise. Wt Readings from Last 3 Encounters:  09/24/23 188 lb 3.2 oz (85.4 kg)  09/03/23 189 lb (85.7 kg)  08/05/23 190 lb 9.6 oz (86.5 kg)    She is in remission from multiple myeloma after stem cell transplant 1 year ago.    Past Medical History:  Diagnosis Date   Allergy    once a year    Hyperlipidemia    borderline- no meds    Pacemaker    Paroxysmal atrial fibrillation (HCC) 6/15   detected on ppm interrogation   PONV (postoperative nausea and vomiting)    has vomitted in past   Second degree Mobitz II AV block    Vitamin D deficiency      No Known Allergies  Current Outpatient Medications on File Prior to Visit  Medication Sig   aspirin 81 MG chewable tablet Chew 81 mg by mouth daily.   atenolol (TENORMIN) 50 MG tablet TAKE 1 TABLET BY MOUTH DAILY FOR BLOOD PRESSURE   Calcium Carb-Cholecalciferol (CALCIUM + VITAMIN D3 PO) Take 1 tablet by mouth daily.   Multiple Vitamin (MULTIVITAMIN) tablet Take 1 tablet by mouth daily.   triamcinolone cream (KENALOG) 0.1 % APPLY  EXTERNALLY TO THE AFFECTED AREA THREE TIMES DAILY AS NEEDED FOR RASH   Vitamin D, Ergocalciferol, (DRISDOL) 1.25 MG (50000 UNIT) CAPS capsule Take 1 capsule (50,000 Units total) by mouth every 7 (seven) days.   acyclovir (ZOVIRAX) 400 MG tablet Take 1 tablet (400 mg total) by mouth 2 (two) times daily. (Patient not taking: Reported on 09/24/2023)   amoxicillin-clavulanate (AUGMENTIN) 875-125 MG tablet Take 1 tablet by mouth every 12 (twelve) hours. (Patient not taking: Reported on 09/24/2023)   benzonatate (TESSALON) 100 MG capsule Take 1 capsule (100 mg total) by mouth every 8 (eight) hours as needed for cough. (Patient not taking: Reported on 09/24/2023)   dexamethasone (DECADRON) 4 MG tablet Take 5 tablets (20 mg total) by mouth every 30 (thirty) days. 1-2 hours before chemo injections every 4 weeks (Patient not taking: Reported on 09/24/2023)   Current Facility-Administered Medications on File Prior to Visit  Medication   0.9 %  sodium chloride infusion    ROS: all negative except above.   Physical Exam:  BP 128/64   Pulse 77   Temp 97.9 F (36.6 C)   Wt 188 lb 3.2 oz (85.4 kg)   SpO2 99%   BMI 33.34 kg/m   General Appearance: Well nourished, in no apparent distress. Eyes: PERRLA, EOMs, conjunctiva no swelling or erythema Sinuses: No Frontal/maxillary tenderness ENT/Mouth: Ext aud canals clear, TMs without erythema, bulging. No erythema, swelling, or exudate on post pharynx.  Tonsils not swollen or erythematous. Hearing normal.  Neck: Supple, thyroid normal.  Respiratory: Respiratory effort normal, BS equal bilaterally without rales, rhonchi, wheezing or stridor.  Cardio: RRR with no MRGs. Brisk peripheral pulses without edema.  Abdomen: Soft, + BS.  Musculoskeletal: Full ROM, 5/5 strength. Unable to reproduce pain on exam in back, no masses or spasms noted . Shoulders bilaterally have mildly decreased ROM Skin: Warm, dry without rashes, lesions, ecchymosis.  Neuro: Cranial  nerves intact. Normal muscle tone, no cerebellar symptoms. Sensation intact.  Psych: Awake and oriented X 3, normal affect, Insight and Judgment appropriate.     Raynelle Dick, NP 10:27 AM [facility]

## 2023-09-24 ENCOUNTER — Other Ambulatory Visit: Payer: Self-pay | Admitting: Hematology

## 2023-09-24 ENCOUNTER — Ambulatory Visit
Admission: RE | Admit: 2023-09-24 | Discharge: 2023-09-24 | Disposition: A | Discharge status: Home / Self Care | Payer: Medicare Other | Source: Ambulatory Visit | Attending: Nurse Practitioner

## 2023-09-24 ENCOUNTER — Ambulatory Visit (INDEPENDENT_AMBULATORY_CARE_PROVIDER_SITE_OTHER): Payer: Medicare Other | Admitting: Nurse Practitioner

## 2023-09-24 ENCOUNTER — Other Ambulatory Visit: Payer: Self-pay

## 2023-09-24 ENCOUNTER — Encounter: Payer: Self-pay | Admitting: Nurse Practitioner

## 2023-09-24 VITALS — BP 128/64 | HR 77 | Temp 97.9°F | Wt 188.2 lb

## 2023-09-24 DIAGNOSIS — M545 Low back pain, unspecified: Secondary | ICD-10-CM

## 2023-09-24 DIAGNOSIS — C9001 Multiple myeloma in remission: Secondary | ICD-10-CM

## 2023-09-24 DIAGNOSIS — G8929 Other chronic pain: Secondary | ICD-10-CM

## 2023-09-24 DIAGNOSIS — M25512 Pain in left shoulder: Secondary | ICD-10-CM

## 2023-09-24 DIAGNOSIS — M25511 Pain in right shoulder: Secondary | ICD-10-CM | POA: Diagnosis not present

## 2023-09-24 DIAGNOSIS — I10 Essential (primary) hypertension: Secondary | ICD-10-CM

## 2023-09-24 MED ORDER — MELOXICAM 7.5 MG PO TABS: 7.5000 mg | ORAL_TABLET | Freq: Every day | ORAL | 2 refills | Status: DC

## 2023-09-24 MED ORDER — LENALIDOMIDE 10 MG PO CAPS: 10.0000 mg | ORAL_CAPSULE | Freq: Every day | ORAL | 0 refills | Status: DC

## 2023-09-24 NOTE — Patient Instructions (Signed)
Acute Back Pain, Adult Acute back pain is sudden and usually short-lived. It is often caused by an injury to the muscles and tissues in the back. The injury may result from: A muscle, tendon, or ligament getting overstretched or torn. Ligaments are tissues that connect bones to each other. Lifting something improperly can cause a back strain. Wear and tear (degeneration) of the spinal disks. Spinal disks are circular tissue that provide cushioning between the bones of the spine (vertebrae). Twisting motions, such as while playing sports or doing yard work. A hit to the back. Arthritis. You may have a physical exam, lab tests, and imaging tests to find the cause of your pain. Acute back pain usually goes away with rest and home care. Follow these instructions at home: Managing pain, stiffness, and swelling Take over-the-counter and prescription medicines only as told by your health care provider. Treatment may include medicines for pain and inflammation that are taken by mouth or applied to the skin, or muscle relaxants. Your health care provider may recommend applying ice during the first 24-48 hours after your pain starts. To do this: Put ice in a plastic bag. Place a towel between your skin and the bag. Leave the ice on for 20 minutes, 2-3 times a day. Remove the ice if your skin turns bright red. This is very important. If you cannot feel pain, heat, or cold, you have a greater risk of damage to the area. If directed, apply heat to the affected area as often as told by your health care provider. Use the heat source that your health care provider recommends, such as a moist heat pack or a heating pad. Place a towel between your skin and the heat source. Leave the heat on for 20-30 minutes. Remove the heat if your skin turns bright red. This is especially important if you are unable to feel pain, heat, or cold. You have a greater risk of getting burned. Activity  Do not stay in bed. Staying in  bed for more than 1-2 days can delay your recovery. Sit up and stand up straight. Avoid leaning forward when you sit or hunching over when you stand. If you work at a desk, sit close to it so you do not need to lean over. Keep your chin tucked in. Keep your neck drawn back, and keep your elbows bent at a 90-degree angle (right angle). Sit high and close to the steering wheel when you drive. Add lower back (lumbar) support to your car seat, if needed. Take short walks on even surfaces as soon as you are able. Try to increase the length of time you walk each day. Do not sit, drive, or stand in one place for more than 30 minutes at a time. Sitting or standing for long periods of time can put stress on your back. Do not drive or use heavy machinery while taking prescription pain medicine. Use proper lifting techniques. When you bend and lift, use positions that put less stress on your back: Bend your knees. Keep the load close to your body. Avoid twisting. Exercise regularly as told by your health care provider. Exercising helps your back heal faster and helps prevent back injuries by keeping muscles strong and flexible. Work with a physical therapist to make a safe exercise program, as recommended by your health care provider. Do any exercises as told by your physical therapist. Lifestyle Maintain a healthy weight. Extra weight puts stress on your back and makes it difficult to have good   posture. Avoid activities or situations that make you feel anxious or stressed. Stress and anxiety increase muscle tension and can make back pain worse. Learn ways to manage anxiety and stress, such as through exercise. General instructions Sleep on a firm mattress in a comfortable position. Try lying on your side with your knees slightly bent. If you lie on your back, put a pillow under your knees. Keep your head and neck in a straight line with your spine (neutral position) when using electronic equipment like  smartphones or pads. To do this: Raise your smartphone or pad to look at it instead of bending your head or neck to look down. Put the smartphone or pad at the level of your face while looking at the screen. Follow your treatment plan as told by your health care provider. This may include: Cognitive or behavioral therapy. Acupuncture or massage therapy. Meditation or yoga. Contact a health care provider if: You have pain that is not relieved with rest or medicine. You have increasing pain going down into your legs or buttocks. Your pain does not improve after 2 weeks. You have pain at night. You lose weight without trying. You have a fever or chills. You develop nausea or vomiting. You develop abdominal pain. Get help right away if: You develop new bowel or bladder control problems. You have unusual weakness or numbness in your arms or legs. You feel faint. These symptoms may represent a serious problem that is an emergency. Do not wait to see if the symptoms will go away. Get medical help right away. Call your local emergency services (911 in the [place] Do not drive yourself to the hospital. Summary Acute back pain is sudden and usually short-lived. Use proper lifting techniques. When you bend and lift, use positions that put less stress on your back. Take over-the-counter and prescription medicines only as told by your health care provider, and apply heat or ice as told. This information is not intended to replace advice given to you by your health care provider. Make sure you discuss any questions you have with your health care provider. Document Revised: 12/16/2020 Document Reviewed: 12/16/2020 Elsevier Patient Education  2024 [employer] Inc.  

## 2023-09-27 ENCOUNTER — Telehealth: Payer: Self-pay | Admitting: Hematology

## 2023-10-03 ENCOUNTER — Inpatient Hospital Stay: Payer: Medicare Other

## 2023-10-03 ENCOUNTER — Inpatient Hospital Stay: Payer: Medicare Other | Attending: Hematology

## 2023-10-03 ENCOUNTER — Encounter: Payer: Self-pay | Admitting: Hematology

## 2023-10-03 VITALS — BP 144/86 | HR 65 | Temp 98.2°F | Resp 18 | Ht 63.0 in | Wt 188.1 lb

## 2023-10-03 DIAGNOSIS — C9001 Multiple myeloma in remission: Secondary | ICD-10-CM

## 2023-10-03 DIAGNOSIS — Z5112 Encounter for antineoplastic immunotherapy: Secondary | ICD-10-CM | POA: Diagnosis present

## 2023-10-03 DIAGNOSIS — Z79620 Long term (current) use of immunosuppressive biologic: Secondary | ICD-10-CM | POA: Insufficient documentation

## 2023-10-03 DIAGNOSIS — C9000 Multiple myeloma not having achieved remission: Secondary | ICD-10-CM | POA: Insufficient documentation

## 2023-10-03 LAB — COMPREHENSIVE METABOLIC PANEL
ALT: 15 U/L (ref 0–44)
AST: 15 U/L (ref 15–41)
Albumin: 4.1 g/dL (ref 3.5–5.0)
Alkaline Phosphatase: 104 U/L (ref 38–126)
Anion gap: 4 — ABNORMAL LOW (ref 5–15)
BUN: 9 mg/dL (ref 8–23)
CO2: 31 mmol/L (ref 22–32)
Calcium: 9.6 mg/dL (ref 8.9–10.3)
Chloride: 108 mmol/L (ref 98–111)
Creatinine, Ser: 0.73 mg/dL (ref 0.44–1.00)
GFR, Estimated: >60 mL/min (ref >60)
Glucose, Bld: 96 mg/dL (ref 70–99)
Potassium: 4.2 mmol/L (ref 3.5–5.1)
Sodium: 143 mmol/L (ref 135–145)
Total Bilirubin: 0.6 mg/dL (ref <1.2)
Total Protein: 6.4 g/dL — ABNORMAL LOW (ref 6.5–8.1)

## 2023-10-03 LAB — CBC WITH DIFFERENTIAL (CANCER CENTER ONLY)
Abs Immature Granulocytes: 0.01 10*3/uL (ref 0.00–0.07)
Basophils Absolute: 0 10*3/uL (ref 0.0–0.1)
Basophils Relative: 1 %
Eosinophils Absolute: 0.2 10*3/uL (ref 0.0–0.5)
Eosinophils Relative: 4 %
HCT: 36.2 % (ref 36.0–46.0)
Hemoglobin: 12.9 g/dL (ref 12.0–15.0)
Immature Granulocytes: 0 %
Lymphocytes Relative: 57 %
Lymphs Abs: 2.1 10*3/uL (ref 0.7–4.0)
MCH: 31.9 pg (ref 26.0–34.0)
MCHC: 35.6 g/dL (ref 30.0–36.0)
MCV: 89.6 fL (ref 80.0–100.0)
Monocytes Absolute: 0.5 10*3/uL (ref 0.1–1.0)
Monocytes Relative: 13 %
Neutro Abs: 0.9 10*3/uL — ABNORMAL LOW (ref 1.7–7.7)
Neutrophils Relative %: 25 %
Platelet Count: 210 10*3/uL (ref 150–400)
RBC: 4.04 MIL/uL (ref 3.87–5.11)
RDW: 16.6 % — ABNORMAL HIGH (ref 11.5–15.5)
WBC Count: 3.7 10*3/uL — ABNORMAL LOW (ref 4.0–10.5)
nRBC: 0 % (ref 0.0–0.2)

## 2023-10-03 MED ORDER — DARATUMUMAB-HYALURONIDASE-FIHJ 1800-30000 MG-UT/15ML ~~LOC~~ SOLN
1800.0000 mg | Freq: Once | SUBCUTANEOUS | Status: AC
Start: 2023-10-03 — End: 2023-10-03
  Administered 2023-10-03: 1800 mg via SUBCUTANEOUS
  Filled 2023-10-03: qty 15

## 2023-10-03 NOTE — Patient Instructions (Signed)

## 2023-10-03 NOTE — Progress Notes (Signed)
Ok to treat with ANC of 0.9 per [place]. Patient took steroid at home around 1030.

## 2023-10-07 NOTE — Progress Notes (Signed)
  Electrophysiology Office Follow up Visit Note:    Date:  10/08/2023   ID:  Alyssa, Dudley [birth year], MRN [id number]  PCP:  Tonita Fallow, MD  [facility] Cardiologist:  None  [facility] Electrophysiologist:  OLE ONEIDA HOLTS, MD    Interval History:     Alyssa Dudley is a 67 y.o. female who presents for a follow up visit.   The patient had a generator replacement July 08, 2023.  She is doing well.  Healing well from her procedure.      Past medical, surgical, social and family history were reviewed.  ROS:   Please see the history of present illness.    All other systems reviewed and are negative.  EKGs/Labs/Other Studies Reviewed:    The following studies were reviewed today:  October 08, 2023 in clinic device interrogation personally reviewed Battery longevity 8.7 years Rate response turned off V pacing greater than 99% Lead parameter stable No underlying at VVI 30        Physical Exam:    VS:  BP (!) 136/90 (BP Location: Left Arm, Patient Position: Sitting, Cuff Size: Large)   Pulse 67   Ht 5' 3 (1.6 m)   Wt 190 lb (86.2 kg)   SpO2 97%   BMI 33.66 kg/m     Wt Readings from Last 3 Encounters:  10/08/23 190 lb (86.2 kg)  10/03/23 188 lb 1.3 oz (85.3 kg)  09/24/23 188 lb 3.2 oz (85.4 kg)     GEN: no distress CARD: RRR, No MRG.  Pacemaker pocket well-healed. RESP: No IWOB. CTAB.      ASSESSMENT:    1. Mobitz type 2 second degree and high-grade heart block   2. Cardiac pacemaker in situ   3. Paroxysmal atrial fibrillation (HCC)    PLAN:    In order of problems listed above:  #Symptomatic bradycardia #Permanent pacemaker in situ Doing well after her generator replacement.  Device functioning appropriately.  Continue remote monitoring.  #Atrial fibrillation On aspirin  and atenolol .  Not on systemic anticoagulation given a very low burden and a low CHA2DS2-VASc.  We will continue to monitor the burden of her atrial  fibrillation using her implanted device.  Follow-up 1 year with APP.   Signed, Ole Holts, MD, [facility], [facility] 10/08/2023 10:04 AM    Electrophysiology [facility]

## 2023-10-08 ENCOUNTER — Ambulatory Visit: Payer: Medicare Other | Attending: Cardiology | Admitting: Cardiology

## 2023-10-08 ENCOUNTER — Encounter: Payer: Self-pay | Admitting: Cardiology

## 2023-10-08 VITALS — BP 136/90 | HR 67 | Ht 63.0 in | Wt 190.0 lb

## 2023-10-08 DIAGNOSIS — Z950 Presence of cardiac pacemaker: Secondary | ICD-10-CM | POA: Insufficient documentation

## 2023-10-08 DIAGNOSIS — I441 Atrioventricular block, second degree: Secondary | ICD-10-CM | POA: Insufficient documentation

## 2023-10-08 DIAGNOSIS — I480 Paroxysmal atrial fibrillation: Secondary | ICD-10-CM | POA: Diagnosis present

## 2023-10-08 LAB — CUP PACEART INCLINIC DEVICE CHECK
Battery Remaining Longevity: 100 mo
Battery Voltage: 3.01 V
Brady Statistic RA Percent Paced: 1 % — CL
Brady Statistic RV Percent Paced: 99.74 %
Date Time Interrogation Session: 20241231102425
Implantable Lead Connection Status: 753985
Implantable Lead Connection Status: 753985
Implantable Lead Implant Date: 20140408
Implantable Lead Implant Date: 20140408
Implantable Lead Location: 753859
Implantable Lead Location: 753860
Implantable Lead Model: 1944
Implantable Lead Model: 1948
Implantable Pulse Generator Implant Date: 20240930
Lead Channel Impedance Value: 512.5 Ohm
Lead Channel Impedance Value: 600 Ohm
Lead Channel Pacing Threshold Amplitude: 0.5 V
Lead Channel Pacing Threshold Amplitude: 0.5 V
Lead Channel Pacing Threshold Amplitude: 0.75 V
Lead Channel Pacing Threshold Amplitude: 0.75 V
Lead Channel Pacing Threshold Pulse Width: 0.5 ms
Lead Channel Pacing Threshold Pulse Width: 0.5 ms
Lead Channel Pacing Threshold Pulse Width: 0.5 ms
Lead Channel Pacing Threshold Pulse Width: 0.5 ms
Lead Channel Sensing Intrinsic Amplitude: 4.6 mV
Lead Channel Setting Pacing Amplitude: 2.5 V
Lead Channel Setting Pacing Amplitude: 2.5 V
Lead Channel Setting Pacing Pulse Width: 0.5 ms
Lead Channel Setting Sensing Sensitivity: 4 mV
Pulse Gen Model: 2272
Pulse Gen Serial Number: 8210888

## 2023-10-08 NOTE — Patient Instructions (Signed)
 Medication Instructions:  Your physician recommends that you continue on your current medications as directed. Please refer to the Current Medication list given to you today.  *If you need a refill on your cardiac medications before your next appointment, please call your pharmacy*  Follow-Up: At [facility], you and your health needs are our priority.  As part of our continuing mission to provide you with exceptional heart care, we have created designated Provider Care Teams.  These Care Teams include your primary Cardiologist (physician) and Advanced Practice Providers (APPs -  Physician Assistants and Nurse Practitioners) who all work together to provide you with the care you need, when you need it.  Your next appointment:   1 year  Provider:   You will see one of the following Advanced Practice Providers on your designated Care Team:   Francis Dowse, Charlott Holler [address] [place], [place] Sherie Don, NP Canary Brim, NP

## 2023-10-10 ENCOUNTER — Ambulatory Visit (INDEPENDENT_AMBULATORY_CARE_PROVIDER_SITE_OTHER): Payer: Medicare Other

## 2023-10-10 DIAGNOSIS — I441 Atrioventricular block, second degree: Secondary | ICD-10-CM

## 2023-10-14 LAB — CUP PACEART REMOTE DEVICE CHECK
Battery Remaining Longevity: 103 mo
Battery Remaining Percentage: 95.5 %
Battery Voltage: 3.01 V
Brady Statistic AP VP Percent: 44 %
Brady Statistic AP VS Percent: 1 %
Brady Statistic AS VP Percent: 56 %
Brady Statistic AS VS Percent: 1 %
Brady Statistic RA Percent Paced: 44 %
Brady Statistic RV Percent Paced: 99 %
Date Time Interrogation Session: 20250104142434
Implantable Lead Connection Status: 753985
Implantable Lead Connection Status: 753985
Implantable Lead Implant Date: 20140408
Implantable Lead Implant Date: 20140408
Implantable Lead Location: 753859
Implantable Lead Location: 753860
Implantable Lead Model: 1944
Implantable Lead Model: 1948
Implantable Pulse Generator Implant Date: 20240930
Lead Channel Impedance Value: 480 Ohm
Lead Channel Impedance Value: 590 Ohm
Lead Channel Pacing Threshold Amplitude: 0.5 V
Lead Channel Pacing Threshold Amplitude: 0.75 V
Lead Channel Pacing Threshold Pulse Width: 0.5 ms
Lead Channel Pacing Threshold Pulse Width: 0.5 ms
Lead Channel Sensing Intrinsic Amplitude: 4.4 mV
Lead Channel Sensing Intrinsic Amplitude: 9.1 mV
Lead Channel Setting Pacing Amplitude: 2.5 V
Lead Channel Setting Pacing Amplitude: 2.5 V
Lead Channel Setting Pacing Pulse Width: 0.5 ms
Lead Channel Setting Sensing Sensitivity: 4 mV
Pulse Gen Model: 2272
Pulse Gen Serial Number: 8210888

## 2023-10-21 ENCOUNTER — Ambulatory Visit (INDEPENDENT_AMBULATORY_CARE_PROVIDER_SITE_OTHER): Payer: Medicare Other | Admitting: Orthopedic Surgery

## 2023-10-21 ENCOUNTER — Other Ambulatory Visit (INDEPENDENT_AMBULATORY_CARE_PROVIDER_SITE_OTHER): Payer: Self-pay

## 2023-10-21 ENCOUNTER — Other Ambulatory Visit: Payer: Self-pay

## 2023-10-21 ENCOUNTER — Ambulatory Visit: Payer: BC Managed Care – PPO

## 2023-10-21 ENCOUNTER — Other Ambulatory Visit (INDEPENDENT_AMBULATORY_CARE_PROVIDER_SITE_OTHER): Payer: Medicare Other

## 2023-10-21 DIAGNOSIS — M19012 Primary osteoarthritis, left shoulder: Secondary | ICD-10-CM

## 2023-10-21 DIAGNOSIS — M25512 Pain in left shoulder: Secondary | ICD-10-CM

## 2023-10-21 DIAGNOSIS — M25511 Pain in right shoulder: Secondary | ICD-10-CM | POA: Diagnosis not present

## 2023-10-21 DIAGNOSIS — M19011 Primary osteoarthritis, right shoulder: Secondary | ICD-10-CM

## 2023-10-21 MED ORDER — LENALIDOMIDE 10 MG PO CAPS: 10.0000 mg | ORAL_CAPSULE | Freq: Every day | ORAL | 0 refills | Status: DC

## 2023-10-22 ENCOUNTER — Encounter: Payer: Self-pay | Admitting: Orthopedic Surgery

## 2023-10-22 MED ORDER — METHYLPREDNISOLONE ACETATE 40 MG/ML IJ SUSP
40.0000 mg | INTRAMUSCULAR | Status: AC | PRN
  Administered 2023-10-21: 40 mg via INTRA_ARTICULAR

## 2023-10-22 MED ORDER — BUPIVACAINE HCL 0.5 % IJ SOLN
9.0000 mL | INTRAMUSCULAR | Status: AC | PRN
  Administered 2023-10-21: 9 mL via INTRA_ARTICULAR

## 2023-10-22 MED ORDER — LIDOCAINE HCL 1 % IJ SOLN
5.0000 mL | INTRAMUSCULAR | Status: AC | PRN
  Administered 2023-10-21: 5 mL

## 2023-10-22 NOTE — Progress Notes (Signed)
 Office Visit Note   Patient: Alyssa Dudley           Date of Birth: 02/22/1956           MRN: [id number] Visit Date: 10/21/2023 Requested by: Jude Lonell BRAVO, NP [address] [place],  [place] [address] PCP: Tonita Fallow, MD  Subjective: Chief Complaint  Patient presents with   Bilateral shoulder pain    HPI: Alyssa Dudley is a 68 y.o. female who presents to the office reporting bilateral shoulder pain right equal to left.  Over 3 years ago she did have an injection in both shoulders which gave her about 3 years of relief.  Patient is not diabetic.  Takes ibuprofen  with some relief.  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  She did have a stem cell transplant in January 2024 and her current cancer is in remission.  She states that the pain in the shoulders does not wake her from sleep at night.  She is right-hand dominant.  Denies any numbness and tingling or neck pain or radicular symptoms.  Describes cough aching pain.  Denies any scapular pain.  Pain is a bigger problem for her than weakness or stiffness..                ROS: All systems reviewed are negative as they relate to the chief complaint within the history of present illness.  Patient denies fevers or chills.  Assessment & Plan: Visit Diagnoses:  1. Bilateral shoulder pain, unspecified chronicity     Plan: Impression is bilateral shoulder arthritis.  She has had very well-maintained passive range of motion of the Rotator cuff strength.  Bilateral shoulder injections performed today in the glenohumeral joint.  She will follow-up as needed.  Does not look like biceps tendon mediated symptoms or rotator cuff bursitis type symptoms but primarily glenohumeral joint arthritis.  Follow-Up Instructions: No follow-ups on file.   Orders:  Orders Placed This Encounter  Procedures   XR Shoulder Left   XR Shoulder Right   US  Guided Needle Placement - No Linked Charges   No orders  of the defined types were placed in this encounter.     Procedures: Large Joint Inj: R glenohumeral on 10/21/2023 11:40 AM Indications: diagnostic evaluation and pain Details: 22 G 1.5 in and 3.5 in needle, ultrasound-guided posterior approach  Arthrogram: No  Medications: 9 mL bupivacaine  0.5 %; 40 mg methylPREDNISolone  acetate 40 MG/ML; 5 mL lidocaine  1 % Outcome: tolerated well, no immediate complications Procedure, treatment alternatives, risks and benefits explained, specific risks discussed. Consent was given by the patient. Immediately prior to procedure a time out was called to verify the correct patient, procedure, equipment, support staff and site/side marked as required. Patient was prepped and draped in the usual sterile fashion.    Large Joint Inj: L glenohumeral on 10/21/2023 11:40 AM Indications: diagnostic evaluation and pain Details: 22 G 1.5 in and 3.5 in needle, ultrasound-guided posterior approach  Arthrogram: No  Medications: 9 mL bupivacaine  0.5 %; 40 mg methylPREDNISolone  acetate 40 MG/ML; 5 mL lidocaine  1 % Outcome: tolerated well, no immediate complications Procedure, treatment alternatives, risks and benefits explained, specific risks discussed. Consent was given by the patient. Immediately prior to procedure a time out was called to verify the correct patient, procedure, equipment, support staff and site/side marked as required. Patient was prepped and draped in the usual sterile fashion.       Clinical  Data: No additional findings.  Objective: Vital Signs: There were no vitals taken for this visit.  Physical Exam:  Constitutional: Patient appears well-developed HEENT:  Head: Normocephalic Eyes:EOM are normal Neck: Normal range of motion Cardiovascular: Normal rate Pulmonary/chest: Effort normal Neurologic: Patient is alert Skin: Skin is warm Psychiatric: Patient has normal mood and affect  Ortho Exam: Ortho exam demonstrates full cervical  spine range of motion 5 out of 5 grip EPL FPL interosseous wrist lection extension bicep triceps and deltoid strength.  Radial pulses palpable bilaterally.  Patient has range of motion of both shoulders passively of 65/100/165.  Rotator cuff strength is Infraspinatus supraspinatus and subscap muscle testing with no discrete AC joint tenderness in either shoulder and negative O'Brien's testing in both shoulders.  Specialty Comments:  No specialty comments available.  Imaging: No results found.   PMFS History: Patient Active Problem List   Diagnosis Date Noted   Multiple myeloma (HCC) 05/08/2022   History of renal calculi 05/17/2021   Osteoarthritis of multiple joints 04/05/2021   Salivary stone 04/05/2021   Meralgia paraesthetica, left 02/13/2018   Plantar fasciitis of left foot 02/13/2018   Lateral epicondylitis of left elbow 02/13/2018   Overweight (BMI 25.0-29.9) 02/25/2015   HTN (hypertension) 08/17/2014   Abnormal glucose 08/17/2014   Medication management 08/17/2014   Atrial fibrillation (HCC) 06/07/2014   Hyperlipidemia    Vitamin D  deficiency    Mobitz type 2 second degree and high-grade heart block 01/07/2013   Family history of malignant neoplasm of gastrointestinal tract 10/15/2011   Benign neoplasm of colon 10/15/2011   GERD 01/10/2008   Constipation 01/10/2008   History of peptic ulcer disease 01/10/2008   Past Medical History:  Diagnosis Date   Allergy    once a year    Hyperlipidemia    borderline- no meds    Pacemaker    Paroxysmal atrial fibrillation (HCC) 6/15   detected on ppm interrogation   PONV (postoperative nausea and vomiting)    has vomitted in past   Second degree Mobitz II AV block    Vitamin D  deficiency     Family History  Problem Relation Age of Onset   Cervical cancer Mother    Hypertension Mother    Diabetes Mother    Heart disease Father    Hyperlipidemia Father    Colon cancer Sister 41   Anemia Sister    Leukemia Sister     Prostate cancer Brother 68   HIV/AIDS Brother    Multiple myeloma Son 41   Hypertension Son    Kidney disease Son    Multiple myeloma Maternal Aunt    Colon polyps Neg Hx    Rectal cancer Neg Hx    Stomach cancer Neg Hx    Liver disease Neg Hx    Esophageal cancer Neg Hx     Past Surgical History:  Procedure Laterality Date   BREAST CYST EXCISION  1980   COLONOSCOPY     EXTRACORPOREAL SHOCK WAVE LITHOTRIPSY Right 12/28/2019   Procedure: RIGHT EXTRACORPOREAL SHOCK WAVE LITHOTRIPSY (ESWL);  Surgeon: Renda Glance, MD;  Location: [facility];  Service: Urology;  Laterality: Right;   PERMANENT PACEMAKER INSERTION N/A 01/13/2013   SJM Accent DR RF implanted by Dr Kelsie for mobitz II AV block   PLANTAR FASCIECTOMY     POLYPECTOMY     PPM GENERATOR CHANGEOUT N/A 07/08/2023   Procedure: PPM GENERATOR CHANGEOUT;  Surgeon: Cindie Ole DASEN, MD;  Location: [facility] INVASIVE CV LAB;  Service: Cardiovascular;  Laterality: N/A;   WRIST SURGERY Right 2011   removed cyst   Social History   Occupational History   Not on file  Tobacco Use   Smoking status: Never   Smokeless tobacco: Never  Vaping Use   Vaping status: Never Used  Substance and Sexual Activity   Alcohol use: No   Drug use: No   Sexual activity: Not Currently

## 2023-10-26 ENCOUNTER — Other Ambulatory Visit: Payer: Self-pay | Admitting: Nurse Practitioner

## 2023-10-27 NOTE — Progress Notes (Deleted)
FOLLOW UP 6 MONTH   Assessment and Plan:  Multiple Myeloma not having achieved remission(HCC) Continue to follow with oncology Has upcoming stem cell transplant 10/11/22  Essential hypertension Continue medication: Atenolol 50mg   Monitor blood pressure at home; call if consistently over 130/80 Continue DASH diet.   Reminder to go to the ER if any CP, SOB, nausea, dizziness, severe HA, changes vision/speech, left arm numbness and tingling and jaw pain.  Paroxysmal atrial fibrillation (HCC) CHADSASC is now 2 since she is being treated for HTN- not on anticoag Followed by cardiology- may want to discuss- will likely need with turning 66 at least  Mobitz type 2 second degree and high-grade heart block Pacemaker in place, continue follow up with Dr. Johney Frame  Gastroesophageal reflux disease, esophagitis presence not specified Well managed on current medications; PRN OTC agents Discussed diet, avoiding triggers and other lifestyle changes  Benign neoplasm of colon, unspecified part of colon UTD  Vitamin D deficiency Continue supplementation for goal of 70-100 Check vitamin D level  Overweight (BMI 25.0-29.9) Long discussion about weight loss, diet, and exercise Recommended diet heavy in fruits and veggies and low in animal meats, cheeses, and dairy products, appropriate calorie intake Patient will work on cutting down on sugary snacks Follow up at next visit  Medication management Continued  Pure hypercholesterolemia Currently at goal by lifestyle modification Continue low cholesterol diet and exercise.  Check lipid panel.    Abnormal glucose Discussed diet/exercise, weight management  A1C defer today  Anemia, unspecified type No supplementation Defer check today, asymptomatic    Further disposition pending results if labs check today. Discussed med's effects and SE's.   Over 30 minutes of face to face interview, exam, counseling, chart review, and critical decision  making was performed.    Future Appointments  Date Time Provider Department Center  10/28/2023  9:30 AM Raynelle Dick, NP [facility] None  10/29/2023 12:30 PM [facility] None  10/29/2023  1:00 PM Vincent Gros E, NP [facility] None  10/29/2023  2:00 PM [facility] INFUSION [facility] None  11/25/2023  1:00 PM [facility] LAB [facility] None  11/25/2023  1:45 PM [facility] INFUSION [facility] None  01/09/2024  7:00 AM [facility] DEVICE REMOTES [facility]  04/09/2024  7:00 AM [facility] DEVICE REMOTES [facility]  04/16/2024  9:00 AM Raynelle Dick, NP [facility] None  07/09/2024  7:00 AM [facility] DEVICE REMOTES [facility]  10/09/2024  7:00 AM [facility] DEVICE REMOTES [facility]  01/08/2025  7:00 AM [facility] DEVICE REMOTES [facility]  04/08/2025  7:00 AM [facility] DEVICE REMOTES [facility]  07/08/2025  7:00 AM [facility] DEVICE REMOTES [facility]     HPI  This very nice 68 y.o.female presents for follow up 3 month and has GERD; Constipation; History of peptic ulcer disease; Family history of malignant neoplasm of gastrointestinal tract; Benign neoplasm of colon; Mobitz type 2 second degree and high-grade heart block; Hyperlipidemia; Vitamin D deficiency; Atrial fibrillation (HCC); HTN (hypertension); Abnormal glucose; Medication management; Overweight (BMI 25.0-29.9); Meralgia paraesthetica, left; Plantar fasciitis of left foot; Lateral epicondylitis of left elbow; Osteoarthritis of multiple joints; Salivary stone; History of renal calculi; and Multiple myeloma (HCC) on their problem list.   She was diagnosed with multiple myeloma earlier in the year. She is being followed by Dr. Rosaria Ferries. She has had all her stem cells collected and is to have the transplant 10/11/22.   She has Afib and mobitz type 2 with pacemaker placement in 2014, follows with Dr. Johney Frame and Uvaldo Rising,  low risk CHADSVASC 2 and not on anticoagulation. Patient reports no complaints at this time.   She had left and right shoulder Xray 2013 showed DJD. She received an injection for this and it has been doing well since.    BMI is There is no height or weight on file to calculate BMI., she has been working on diet and exercise.  Wt Readings from Last 3 Encounters:  10/08/23 190 lb (86.2 kg)  10/03/23 188 lb 1.3 oz (85.3 kg)  09/24/23 188 lb 3.2 oz (85.4 kg)   Her blood pressure has been controlled at home, today their BP is    BP Readings from Last 3 Encounters:  10/08/23 (!) 136/90  10/03/23 (!) 144/86  09/24/23 128/64     She does workout. She denies chest pain, shortness of breath, dizziness.   She is not on cholesterol medication, she tried zetia but states she had diarrhea with that, taking 3 x a week. . Her cholesterol is not at goal. The cholesterol last visit was:   Lab Results  Component Value Date   CHOL 175 04/17/2023   HDL 49 (L) 04/17/2023   LDLCALC 105 (H) 04/17/2023   TRIG 112 04/17/2023   CHOLHDL 3.6 04/17/2023    She has been working on diet and exercise for glucose management, and denies foot ulcerations, increased appetite, nausea, paresthesia of the feet, polydipsia, polyuria, visual disturbances and vomiting. Last A1C in the office was:  Lab Results  Component Value Date   HGBA1C 5.8 (H) 04/17/2023   Patient is on Vitamin D supplement, 1 capsule twice a week Lab Results  Component Value Date   VD25OH 63 04/17/2023       Current Medications:  Current Outpatient Medications on File Prior to Visit  Medication Sig Dispense Refill   acyclovir (ZOVIRAX) 400 MG tablet Take 1 tablet (400 mg total) by mouth 2 (two) times daily. (Patient not taking: Reported on 09/24/2023) 60 tablet 5   aspirin 81 MG chewable tablet Chew 81 mg by mouth daily.     atenolol (TENORMIN) 50 MG tablet TAKE 1 TABLET BY MOUTH DAILY FOR BLOOD PRESSURE 90 tablet 3   Calcium  Carb-Cholecalciferol (CALCIUM + VITAMIN D3 PO) Take 1 tablet by mouth daily.     lenalidomide (REVLIMID) 10 MG capsule Take 1 capsule (10 mg total) by mouth daily. Take 1 capsules (10 mg total) by mouth daily for 21 days with 7 days off for a 28 day cycle.  [employer] Auth # [id number]    Date Obtained 10/21/2023 28 capsule 0   meloxicam (MOBIC) 7.5 MG tablet Take 1 tablet (7.5 mg total) by mouth daily. (Patient not taking: Reported on 10/08/2023) 30 tablet 2   Multiple Vitamin (MULTIVITAMIN) tablet Take 1 tablet by mouth daily.     triamcinolone cream (KENALOG) 0.1 % APPLY EXTERNALLY TO THE AFFECTED AREA THREE TIMES DAILY AS NEEDED FOR RASH 30 g 2   Vitamin D, Ergocalciferol, (DRISDOL) 1.25 MG (50000 UNIT) CAPS capsule Take 1 capsule (50,000 Units total) by mouth every 7 (seven) days. 13 capsule 2   Current Facility-Administered Medications on File Prior to Visit  Medication Dose Route Frequency Provider Last Rate Last Admin   0.9 %  sodium chloride infusion  500 mL Intravenous Once Pyrtle, Carie Caddy, MD       Health Maintenance:   Immunization History  Administered Date(s) Administered   DTaP / IPV 04/16/2023   HIB (PRP-T) 04/16/2023   Hepb-cpg 04/16/2023   Influenza Inj  Mdck Quad With Preservative 07/26/2017, 08/11/2018, 07/13/2019, 07/25/2020   Influenza Split 06/30/2013, 07/20/2014, 07/21/2015   Influenza, High Dose Seasonal PF 07/05/2022   Influenza,inj,Quad PF,6+ Mos 08/04/2021   Influenza,inj,quad, With Preservative 07/05/2016   Influenza,trivalent, recombinat, inj, PF 06/26/2023   Influenza-Unspecified 07/24/2022   Moderna Covid-19 Fall Seasonal Vaccine 43yrs & older 01/22/2023   PFIZER(Purple Top)SARS-COV-2 Vaccination 12/05/2019, 12/26/2019, 07/23/2020   PNEUMOCOCCAL CONJUGATE-20 04/16/2023   PPD Test 02/22/2014, 05/10/2015   Pfizer Covid-19 Vaccine Bivalent Booster 85yrs & up 06/21/2022   Tdap 10/09/2011   Zoster Recombinant(Shingrix) 01/09/2017, 02/06/2018, 02/06/2018    Colonoscopy:05/2018 Mammo: 08/2019  BMD: 2015 WNL will wait until closer to 66 Pap/ Pelvic: 2018 WNL - HPV neg DUE AB [place] 2011 CXR 2014 Echo 01/2013  EYE: Dr. Sharlot Gowda, last 2018, wears glasses Dentist: Dr. Darrol Angel, Q 64month   Allergies: No Known Allergies Medical History:  has GERD; Constipation; History of peptic ulcer disease; Family history of malignant neoplasm of gastrointestinal tract; Benign neoplasm of colon; Mobitz type 2 second degree and high-grade heart block; Hyperlipidemia; Vitamin D deficiency; Atrial fibrillation (HCC); HTN (hypertension); Abnormal glucose; Medication management; Overweight (BMI 25.0-29.9); Meralgia paraesthetica, left; Plantar fasciitis of left foot; Lateral epicondylitis of left elbow; Osteoarthritis of multiple joints; Salivary stone; History of renal calculi; and Multiple myeloma (HCC) on their problem list. Surgical History:  She  has a past surgical history that includes Breast cyst excision (1980); Wrist surgery (Right, 2011); permanent pacemaker insertion (N/A, 01/13/2013); Colonoscopy; Polypectomy; Plantar fasciectomy; Extracorporeal shock wave lithotripsy (Right, 12/28/2019); and PPM GENERATOR CHANGEOUT (N/A, 07/08/2023). Family History:  Her family history includes Anemia in her sister; Cervical cancer in her mother; Colon cancer (age of onset: 65) in her sister; Diabetes in her mother; HIV/AIDS in her brother; Heart disease in her father; Hyperlipidemia in her father; Hypertension in her mother and son; Kidney disease in her son; Leukemia in her sister; Multiple myeloma in her maternal aunt; Multiple myeloma (age of onset: 84) in her son; Prostate cancer (age of onset: 52) in her brother. Social History:   reports that she has never smoked. She has never used smokeless tobacco. She reports that she does not drink alcohol and does not use drugs.  Review of Systems: Review of Systems  Constitutional:  Negative for chills, fever, malaise/fatigue and  weight loss.  HENT:  Negative for ear discharge, ear pain, hearing loss, nosebleeds and tinnitus.   Eyes:  Negative for blurred vision, double vision, photophobia, pain and discharge.  Respiratory:  Negative for cough, hemoptysis, sputum production and shortness of breath.   Cardiovascular:  Negative for chest pain, palpitations, orthopnea and claudication.  Gastrointestinal:  Negative for abdominal pain, constipation, diarrhea, heartburn, nausea and vomiting.  Genitourinary:  Negative for dysuria, flank pain, frequency, hematuria and urgency.  Musculoskeletal:  Positive for joint pain. Negative for back pain, falls, myalgias and neck pain.       Hands, intermittent  Skin:  Negative for itching and rash.  Neurological:  Negative for dizziness, tingling, tremors, sensory change, speech change, focal weakness, weakness and headaches.  Endo/Heme/Allergies:  Negative for environmental allergies and polydipsia. Does not bruise/bleed easily.  Psychiatric/Behavioral:  Negative for depression, hallucinations, substance abuse and suicidal ideas. The patient is not nervous/anxious.     Physical Exam: Estimated body mass index is 33.66 kg/m as calculated from the following:   Height as of 10/08/23: 5\' 3"  (1.6 m).   Weight as of 10/08/23: 190 lb (86.2 kg). There were no vitals taken for this visit.  General Appearance: Well nourished, in no apparent distress.  Eyes: PERRLA, EOMs, conjunctiva no swelling or erythema, normal fundi and vessels.  Sinuses: No Frontal/maxillary tenderness  ENT/Mouth: Ext aud canals clear, normal light reflex with TMs without erythema, bulging. Good dentition. No erythema, swelling, or exudate on post pharynx. Tonsils not swollen or erythematous. Hearing normal.  Neck: Supple, thyroid normal. No bruits  Respiratory: Respiratory effort normal, BS equal bilaterally without rales, rhonchi, wheezing or stridor.  Cardio: RRR without murmurs, rubs or gallops. Brisk peripheral  pulses without edema.  Chest: symmetric, with normal excursions and percussion.  Abdomen: Soft, nontender, no guarding, rebound, hernias, masses, or organomegaly.  Lymphatics: Non tender without lymphadenopathy.  Musculoskeletal: Full ROM all peripheral extremities,5/5 strength, and normal gait, Skin: Warm, dry without rashes, lesions, ecchymosis. Neuro: Cranial nerves intact, reflexes equal bilaterally. Normal muscle tone, no cerebellar symptoms. Sensation intact.  Psych: Awake and oriented X 3, normal affect, Insight and Judgment appropriate.     [facility]  10/27/2023

## 2023-10-28 ENCOUNTER — Ambulatory Visit: Payer: BLUE CROSS/BLUE SHIELD | Admitting: Nurse Practitioner

## 2023-10-28 DIAGNOSIS — E559 Vitamin D deficiency, unspecified: Secondary | ICD-10-CM

## 2023-10-28 DIAGNOSIS — K219 Gastro-esophageal reflux disease without esophagitis: Secondary | ICD-10-CM

## 2023-10-28 DIAGNOSIS — I480 Paroxysmal atrial fibrillation: Secondary | ICD-10-CM

## 2023-10-28 DIAGNOSIS — D126 Benign neoplasm of colon, unspecified: Secondary | ICD-10-CM

## 2023-10-28 DIAGNOSIS — E663 Overweight: Secondary | ICD-10-CM

## 2023-10-28 DIAGNOSIS — R7309 Other abnormal glucose: Secondary | ICD-10-CM

## 2023-10-28 DIAGNOSIS — E7800 Pure hypercholesterolemia, unspecified: Secondary | ICD-10-CM

## 2023-10-28 DIAGNOSIS — Z79899 Other long term (current) drug therapy: Secondary | ICD-10-CM

## 2023-10-28 DIAGNOSIS — I10 Essential (primary) hypertension: Secondary | ICD-10-CM

## 2023-10-28 DIAGNOSIS — I441 Atrioventricular block, second degree: Secondary | ICD-10-CM

## 2023-10-28 DIAGNOSIS — C9001 Multiple myeloma in remission: Secondary | ICD-10-CM

## 2023-10-28 NOTE — Progress Notes (Unsigned)
Patient Care Team: Lucky Cowboy, MD as PCP - General (Internal Medicine) Lanier Prude, MD as PCP - Electrophysiology (Cardiology) Mardella Layman, MD as Consulting Physician (Gastroenterology) Gelene Mink, OD as Referring Physician (Optometry) Pricilla Riffle, MD as Consulting Physician (Cardiology) Cindee Salt, MD as Consulting Physician (Orthopedic Surgery) Reece Levy, MD as Referring Physician (Hematology and Oncology) Malachy Mood, MD as Attending Physician (Hematology and Oncology)  Clinic Day:  10/29/2023  Referring physician: Lucky Cowboy, MD  ASSESSMENT & PLAN:   Assessment & Plan: Multiple myeloma (HCC)  IgG lambda type, stage II, high risk -Diagnosed with CRAB criteria and bone marrow bx 05/01/2022, pathology confirmed plasma cell myeloma (60% plasma cells). Cytogenics and FISH c/w standard risk multiple myeloma except the deletion of 1P and gain of 1 q. which is a higher risk feature.  She is being treated as high risk -S/p 5 cycles induction DaraVRd starting 05/15/22 followed by autologous stem cell transplant 10/12/22 by Dr. Rosaria Ferries. Post transplant PET and marrow showed no evidence of disease -she received one cycle of DaraVRd to complete a total of 6 cycles -will continue dara and revlimid maintenance, with daratumumab injection 1800 mg every month for 2 years and Revlimid 10 mg on days 1-21 every 28 days, she started on March 18, 2023. -Will check her multiple myeloma labs and f/u every 2 months, M protein negative and light chain levels normal on 07/10/2023, she remains in remission.  -She will also follow-up with her transplant team   Plan:  Labs reviewed  -CBC showing WBC 4.4; Hgb 13.4; Hct 39.1;  MCV 90.1; plt 222; Anc 1.6 -CMP - K 4.3; glucose 114; BUN 10; Creatinine 0.77; eGFR > 60; Ca 9.7; LFTs normal.   -Multiple myeloma panel and serum light chains -pending Patient is clinically doing well.  Labs are satisfactory for treatment. Proceed  with daratumumab and continue monthly. Patient should continue Revlimid at home.  She is taking 10 mg daily for 21 days followed by 7 days off. Continue labs and daratumumab monthly. Labs, follow-up, and daratumumab in 2 months.  The patient understands the plans discussed today and is in agreement with them.  She knows to contact our office if she develops concerns prior to her next appointment.  I provided 20 minutes of face-to-face time during this encounter and > 50% was spent counseling as documented under my assessment and plan.    Carlean Jews, NP  [facility] - A DEPT OF [facility] [address] [place] [address] Dept: [phone] Dept Fax: [phone]   No orders of the defined types were placed in this encounter.     CHIEF COMPLAINT:  CC: Multiple myeloma  Current Treatment: Maintenance daratumumab and Revlimid  INTERVAL HISTORY:  Alyssa Dudley is here today for repeat clinical assessment.  Patient last seen by Dr. Mosetta Putt on 08/05/2023.  Most recent M protein was negative.  Goal is to complete 2 years of daratumumab.  Patient reports meeting with transplant team approximately 2 weeks ago.  Labs were good.  Continues to be in remission.  Getting her "childhood" immunizations again.  No new concerns or complaints today. She denies chest pain, chest pressure, or shortness of breath. She denies headaches or visual disturbances. She denies abdominal pain, nausea, vomiting, or changes in bowel or bladder habits.   She denies fevers or chills. She denies pain. Her appetite is good. Her weight has decreased 3 pounds  over last 3 weeks .  I have reviewed the past medical history, past surgical history, social history and family history with the patient and they are unchanged from previous note.  ALLERGIES:  has no known allergies.  MEDICATIONS:  Current Outpatient Medications  Medication Sig Dispense Refill    acyclovir (ZOVIRAX) 400 MG tablet Take 1 tablet (400 mg total) by mouth 2 (two) times daily. 60 tablet 5   aspirin 81 MG chewable tablet Chew 81 mg by mouth daily.     atenolol (TENORMIN) 50 MG tablet TAKE 1 TABLET BY MOUTH DAILY FOR BLOOD PRESSURE 90 tablet 3   Calcium Carb-Cholecalciferol (CALCIUM + VITAMIN D3 PO) Take 1 tablet by mouth daily.     lenalidomide (REVLIMID) 10 MG capsule Take 1 capsule (10 mg total) by mouth daily. Take 1 capsules (10 mg total) by mouth daily for 21 days with 7 days off for a 28 day cycle.  Siri Cole # [id number]    Date Obtained 10/21/2023 28 capsule 0   Multiple Vitamin (MULTIVITAMIN) tablet Take 1 tablet by mouth daily.     triamcinolone cream (KENALOG) 0.1 % APPLY EXTERNALLY TO THE AFFECTED AREA THREE TIMES DAILY AS NEEDED FOR RASH 30 g 2   Vitamin D, Ergocalciferol, (DRISDOL) 1.25 MG (50000 UNIT) CAPS capsule Take 1 capsule (50,000 Units total) by mouth every 7 (seven) days. 13 capsule 2   Current Facility-Administered Medications  Medication Dose Route Frequency Provider Last Rate Last Admin   0.9 %  sodium chloride infusion  500 mL Intravenous Once Pyrtle, Carie Caddy, MD        HISTORY OF PRESENT ILLNESS:   Oncology History Overview Note   Cancer Staging  Multiple myeloma (HCC) Staging form: Plasma Cell Myeloma and Plasma Cell Disorders, AJCC 8th Edition - Clinical stage from 05/01/2022: RISS Stage II (Beta-2-microglobulin (mg/L): 3.2, Albumin (g/dL): 3.6, ISS: Stage I, High-risk cytogenetics: Absent, LDH: Elevated) - Signed by Malachy Mood, MD on 06/03/2022 Stage prefix: Initial diagnosis Beta 2 microglobulin range (mg/L): Less than 3.5 Albumin range (g/dL): Greater than or equal to 3.5 Cytogenetics: 1p deletion     Multiple myeloma (HCC)  05/01/2022 Cancer Staging   Staging form: Plasma Cell Myeloma and Plasma Cell Disorders, AJCC 8th Edition - Clinical stage from 05/01/2022: RISS Stage II (Beta-2-microglobulin (mg/L): 3.2, Albumin (g/dL): 3.6, ISS: Stage  I, High-risk cytogenetics: Absent, LDH: Elevated) - Signed by Malachy Mood, MD on 06/03/2022 Stage prefix: Initial diagnosis Beta 2 microglobulin range (mg/L): Less than 3.5 Albumin range (g/dL): Greater than or equal to 3.5 Cytogenetics: 1p deletion   05/08/2022 Initial Diagnosis   Multiple myeloma (HCC)   05/14/2022 - 02/28/2023 Chemotherapy   Patient is on Treatment Plan : MYELOMA NEWLY DIAGNOSED TRANSPLANT CANDIDATE DaraVRd (Daratumumab IV) q21d x 6 Cycles (Induction/Consolidation)     05/15/2022 - 05/28/2022 Chemotherapy   Patient is on Treatment Plan : MYELOMA NEWLY DIAGNOSED TRANSPLANT CANDIDATE DaraVRd (Daratumumab IV) q21d x 6 Cycles (Induction/Consolidation)     03/18/2023 -  Chemotherapy   Patient is on Treatment Plan : MYELOMA Daratumumab SQ q28d         REVIEW OF SYSTEMS:   Constitutional: Denies fevers, chills or abnormal weight loss Eyes: Denies blurriness of vision Ears, nose, mouth, throat, and face: Denies mucositis or sore throat Respiratory: Denies cough, dyspnea or wheezes Cardiovascular: Denies palpitation, chest discomfort or lower extremity swelling Gastrointestinal:  Denies nausea, heartburn or change in bowel habits Skin: Denies abnormal skin rashes Lymphatics: Denies new lymphadenopathy or easy  bruising Neurological:Denies numbness, tingling or new weaknesses Behavioral/Psych: Mood is stable, no new changes  All other systems were reviewed with the patient and are negative.   VITALS:   Today's Vitals   10/29/23 1311 10/29/23 1312 10/29/23 1318  BP: (!) 157/79 (!) 151/65   Pulse: 66    Resp: 18    Temp: 97.7 F (36.5 C)    TempSrc: Temporal    SpO2: 100%    Weight: 187 lb 14.4 oz (85.2 kg)    PainSc:   0-No pain   Body mass index is 33.28 kg/m.   Wt Readings from Last 3 Encounters:  10/29/23 187 lb 14.4 oz (85.2 kg)  10/08/23 190 lb (86.2 kg)  10/03/23 188 lb 1.3 oz (85.3 kg)    Body mass index is 33.28 kg/m.  Performance status (ECOG): 0 -  Asymptomatic  PHYSICAL EXAM:   GENERAL:alert, no distress and comfortable SKIN: skin color, texture, turgor are normal, no rashes or significant lesions EYES: normal, Conjunctiva are pink and non-injected, sclera clear OROPHARYNX:no exudate, no erythema and lips, buccal mucosa, and tongue normal  NECK: supple, thyroid normal size, non-tender, without nodularity LYMPH:  no palpable lymphadenopathy in the cervical, axillary or inguinal LUNGS: clear to auscultation and percussion with normal breathing effort HEART: regular rate & rhythm and no murmurs and no lower extremity edema ABDOMEN:abdomen soft, non-tender and normal bowel sounds Musculoskeletal:no cyanosis of digits and no clubbing  NEURO: alert & oriented x 3 with fluent speech, no focal motor/sensory deficits  LABORATORY DATA:  I have reviewed the data as listed    Component Value Date/Time   NA 142 10/29/2023 1250   NA 146 (H) 07/01/2023 1424   K 4.3 10/29/2023 1250   CL 106 10/29/2023 1250   CO2 30 10/29/2023 1250   GLUCOSE 114 (H) 10/29/2023 1250   BUN 10 10/29/2023 1250   BUN 12 07/01/2023 1424   CREATININE 0.77 10/29/2023 1250   CREATININE 0.67 04/17/2023 0950   CALCIUM 9.7 10/29/2023 1250   PROT 6.7 10/29/2023 1250   ALBUMIN 4.4 10/29/2023 1250   AST 15 10/29/2023 1250   AST 15 02/28/2023 0757   ALT 16 10/29/2023 1250   ALT 19 02/28/2023 0757   ALKPHOS 114 10/29/2023 1250   BILITOT 1.0 10/29/2023 1250   BILITOT 0.5 02/28/2023 0757   GFRNONAA >60 10/29/2023 1250   GFRNONAA >60 02/28/2023 0757   GFRNONAA 94 04/05/2021 1025   GFRAA 108 04/05/2021 1025    Lab Results  Component Value Date   WBC 4.4 10/29/2023   NEUTROABS 1.6 (L) 10/29/2023   HGB 13.4 10/29/2023   HCT 39.1 10/29/2023   MCV 90.1 10/29/2023   PLT 222 10/29/2023     RADIOGRAPHIC STUDIES: XR Shoulder Left Result Date: 10/22/2023 AP axillary outlet radiographs left shoulder reviewed.  Bone-on-bone changes of the glenohumeral joint are  present.  No fracture or dislocation.  Acromiohumeral distance maintained.  Moderate AC joint degenerative changes.   visualized lung fields clear  XR Shoulder Right Result Date: 10/22/2023 AP axillary outlet radiographs right shoulder reviewed.  Degenerative joint disease is present in the glenohumeral joint.  Acromiohumeral distance maintained.  Moderate degenerative changes also present in the [facility] joint.  Visualized lung fields clear.  No fracture or dislocation in the glenohumeral joint.  US Guided Needle Placement - No Linked Charges Result Date: 10/22/2023 Ultrasound imaging demonstrates needle placement into the right shoulder and the left shoulder with extravasation of fluid into the joint and no complicating  features  CUP PACEART REMOTE DEVICE CHECK Result Date: 10/14/2023 Scheduled remote reviewed. Normal device function.  Next remote 91 days. LA, CVRS  CUP PACEART INCLINIC DEVICE CHECK Result Date: 10/08/2023 Pacemaker check in clinic. Normal device function. Thresholds, sensing, impedance's consistent with previous measurements. Device programmed to maximize longevity. No mode switch or high ventricular rates noted. Device programmed at appropriate safety margins. Device programmed to optimize intrinsic conduction. Estimated longevity 8.9 years. Patient enrolled in remote follow-up. Patient education completed. - Histogram distribution not appropriate for patient activity level (flat) and patient stated feeling odd and requiring rest at times during increased activity. Spoke with device rep Judie Grieve regarding this issue. Patient's rate response setting was turned off as it was theorized that the patient was competing with the pacemaker during activity. Patient was then asked to walk briskly around the clinic with RN as standby contact assist if needed. After activity, patient's HR re-read by [facility] device and rate histogram had noticeable improvement and patient denied any "odd feelings" or  shortness of breath. Dr Lalla Brothers was made aware of this change at bedside during his assessment.Casilda Carls, BSN, RN

## 2023-10-28 NOTE — Assessment & Plan Note (Signed)
IgG lambda type, stage II, high risk -Diagnosed with CRAB criteria and bone marrow bx 05/01/2022, pathology confirmed plasma cell myeloma (60% plasma cells). Cytogenics and FISH c/w standard risk multiple myeloma except the deletion of 1P and gain of 1 q. which is a higher risk feature.  She is being treated as high risk -S/p 5 cycles induction DaraVRd starting 05/15/22 followed by autologous stem cell transplant 10/12/22 by Dr. Rosaria Ferries. Post transplant PET and marrow showed no evidence of disease -she received one cycle of DaraVRd to complete a total of 6 cycles -will continue dara and revlimid maintenance, with daratumumab injection 1800 mg every month for 2 years and Revlimid 10 mg on days 1-21 every 28 days, she started on March 18, 2023. -Will check her multiple myeloma labs and f/u every 2 months, M protein negative and light chain levels normal on 07/10/2023, she remains in remission.  -She will also follow-up with her transplant team

## 2023-10-29 ENCOUNTER — Inpatient Hospital Stay (HOSPITAL_BASED_OUTPATIENT_CLINIC_OR_DEPARTMENT_OTHER): Payer: Medicare Other | Admitting: Nurse Practitioner

## 2023-10-29 ENCOUNTER — Encounter: Payer: Self-pay | Admitting: Nurse Practitioner

## 2023-10-29 ENCOUNTER — Inpatient Hospital Stay: Payer: Medicare Other

## 2023-10-29 ENCOUNTER — Inpatient Hospital Stay: Payer: Medicare Other | Attending: Hematology

## 2023-10-29 ENCOUNTER — Ambulatory Visit: Payer: Medicare Other

## 2023-10-29 ENCOUNTER — Other Ambulatory Visit: Payer: Medicare Other

## 2023-10-29 VITALS — BP 151/65 | HR 66 | Temp 97.7°F | Resp 18 | Wt 187.9 lb

## 2023-10-29 DIAGNOSIS — C9001 Multiple myeloma in remission: Secondary | ICD-10-CM | POA: Diagnosis not present

## 2023-10-29 DIAGNOSIS — C9000 Multiple myeloma not having achieved remission: Secondary | ICD-10-CM | POA: Diagnosis present

## 2023-10-29 DIAGNOSIS — Z5112 Encounter for antineoplastic immunotherapy: Secondary | ICD-10-CM | POA: Diagnosis present

## 2023-10-29 LAB — CBC WITH DIFFERENTIAL (CANCER CENTER ONLY)
Abs Immature Granulocytes: 0 10*3/uL (ref 0.00–0.07)
Basophils Absolute: 0.1 10*3/uL (ref 0.0–0.1)
Basophils Relative: 1 %
Eosinophils Absolute: 0.2 10*3/uL (ref 0.0–0.5)
Eosinophils Relative: 4 %
HCT: 39.1 % (ref 36.0–46.0)
Hemoglobin: 13.4 g/dL (ref 12.0–15.0)
Immature Granulocytes: 0 %
Lymphocytes Relative: 49 %
Lymphs Abs: 2.2 10*3/uL (ref 0.7–4.0)
MCH: 30.9 pg (ref 26.0–34.0)
MCHC: 34.3 g/dL (ref 30.0–36.0)
MCV: 90.1 fL (ref 80.0–100.0)
Monocytes Absolute: 0.5 10*3/uL (ref 0.1–1.0)
Monocytes Relative: 10 %
Neutro Abs: 1.6 10*3/uL — ABNORMAL LOW (ref 1.7–7.7)
Neutrophils Relative %: 36 %
Platelet Count: 222 10*3/uL (ref 150–400)
RBC: 4.34 MIL/uL (ref 3.87–5.11)
RDW: 15.9 % — ABNORMAL HIGH (ref 11.5–15.5)
WBC Count: 4.4 10*3/uL (ref 4.0–10.5)
nRBC: 0 % (ref 0.0–0.2)

## 2023-10-29 LAB — COMPREHENSIVE METABOLIC PANEL
ALT: 16 U/L (ref 0–44)
AST: 15 U/L (ref 15–41)
Albumin: 4.4 g/dL (ref 3.5–5.0)
Alkaline Phosphatase: 114 U/L (ref 38–126)
Anion gap: 6 (ref 5–15)
BUN: 10 mg/dL (ref 8–23)
CO2: 30 mmol/L (ref 22–32)
Calcium: 9.7 mg/dL (ref 8.9–10.3)
Chloride: 106 mmol/L (ref 98–111)
Creatinine, Ser: 0.77 mg/dL (ref 0.44–1.00)
GFR, Estimated: >60 mL/min (ref >60)
Glucose, Bld: 114 mg/dL — ABNORMAL HIGH (ref 70–99)
Potassium: 4.3 mmol/L (ref 3.5–5.1)
Sodium: 142 mmol/L (ref 135–145)
Total Bilirubin: 1 mg/dL (ref 0.0–1.2)
Total Protein: 6.7 g/dL (ref 6.5–8.1)

## 2023-10-29 MED ORDER — DARATUMUMAB-HYALURONIDASE-FIHJ 1800-30000 MG-UT/15ML ~~LOC~~ SOLN
1800.0000 mg | Freq: Once | SUBCUTANEOUS | Status: AC
  Administered 2023-10-29: 1800 mg via SUBCUTANEOUS
  Filled 2023-10-29: qty 15

## 2023-10-29 NOTE — Patient Instructions (Signed)
 [facility] - A DEPT OF [facility]  Discharge Instructions: Thank you for choosing [facility] to provide your oncology and hematology care.   If you have a lab appointment with the Cancer Center, please go directly to the Cancer Center and check in at the registration area.   Wear comfortable clothing and clothing appropriate for easy access to any Portacath or PICC line.   We strive to give you quality time with your provider. You may need to reschedule your appointment if you arrive late (15 or more minutes).  Arriving late affects you and other patients whose appointments are after yours.  Also, if you miss three or more appointments without notifying the office, you may be dismissed from the clinic at the provider's discretion.      For prescription refill requests, have your pharmacy contact our office and allow 72 hours for refills to be completed.    Today you received the following chemotherapy and/or immunotherapy agents Darzalex faspro      To help prevent nausea and vomiting after your treatment, we encourage you to take your nausea medication as directed.  BELOW ARE SYMPTOMS THAT SHOULD BE REPORTED IMMEDIATELY: *FEVER GREATER THAN 100.4 F (38 C) OR HIGHER *CHILLS OR SWEATING *NAUSEA AND VOMITING THAT IS NOT CONTROLLED WITH YOUR NAUSEA MEDICATION *UNUSUAL SHORTNESS OF BREATH *UNUSUAL BRUISING OR BLEEDING *URINARY PROBLEMS (pain or burning when urinating, or frequent urination) *BOWEL PROBLEMS (unusual diarrhea, constipation, pain near the anus) TENDERNESS IN MOUTH AND THROAT WITH OR WITHOUT PRESENCE OF ULCERS (sore throat, sores in mouth, or a toothache) UNUSUAL RASH, SWELLING OR PAIN  UNUSUAL VAGINAL DISCHARGE OR ITCHING   Items with * indicate a potential emergency and should be followed up as soon as possible or go to the Emergency Department if any problems should occur.  Please show the CHEMOTHERAPY ALERT CARD or  IMMUNOTHERAPY ALERT CARD at check-in to the Emergency Department and triage nurse.  Should you have questions after your visit or need to cancel or reschedule your appointment, please contact [facility] - [facility]  Dept: [phone]  and follow the prompts.  Office hours are 8:00 a.m. to 4:30 p.m. Monday - Friday. Please note that voicemails left after 4:00 p.m. may not be returned until the following business day.  We are closed weekends and major holidays. You have access to a nurse at all times for urgent questions. Please call the main number to the clinic Dept: [phone] and follow the prompts.   For any non-urgent questions, you may also contact your provider using MyChart. We now offer e-Visits for anyone 60 and older to request care online for non-urgent symptoms. For details visit [email or web address].   Also download the MyChart app! Go to the app store, search "MyChart", open the app, select [place], and log in with your MyChart username and password.

## 2023-10-30 LAB — KAPPA/LAMBDA LIGHT CHAINS
Kappa free light chain: 3.2 mg/L — ABNORMAL LOW (ref 3.3–19.4)
Kappa, lambda light chain ratio: 0.56 (ref 0.26–1.65)
Lambda free light chains: 5.7 mg/L (ref 5.7–26.3)

## 2023-10-30 NOTE — Progress Notes (Addendum)
Myeloma panel pending. Results reviewed 10/29/2023. M. protein spike not observed. Considered continued remission.

## 2023-10-31 ENCOUNTER — Telehealth: Payer: Self-pay | Admitting: Hematology

## 2023-10-31 NOTE — Telephone Encounter (Signed)
 Marland Kitchen

## 2023-11-03 LAB — MULTIPLE MYELOMA PANEL, SERUM
Albumin SerPl Elph-Mcnc: 3.8 g/dL (ref 2.9–4.4)
Albumin/Glob SerPl: 1.6 (ref 0.7–1.7)
Alpha 1: 0.2 g/dL (ref 0.0–0.4)
Alpha2 Glob SerPl Elph-Mcnc: 0.8 g/dL (ref 0.4–1.0)
B-Globulin SerPl Elph-Mcnc: 1 g/dL (ref 0.7–1.3)
Gamma Glob SerPl Elph-Mcnc: 0.5 g/dL (ref 0.4–1.8)
Globulin, Total: 2.5 g/dL (ref 2.2–3.9)
IgA: 16 mg/dL — ABNORMAL LOW (ref 87–352)
IgG (Immunoglobin G), Serum: 698 mg/dL (ref 586–1602)
IgM (Immunoglobulin M), Srm: 26 mg/dL (ref 26–217)
Total Protein ELP: 6.3 g/dL (ref 6.0–8.5)

## 2023-11-04 ENCOUNTER — Encounter: Payer: Self-pay | Admitting: Hematology

## 2023-11-08 ENCOUNTER — Ambulatory Visit (INDEPENDENT_AMBULATORY_CARE_PROVIDER_SITE_OTHER): Payer: Medicare Other | Admitting: Nurse Practitioner

## 2023-11-08 ENCOUNTER — Encounter: Payer: Self-pay | Admitting: Nurse Practitioner

## 2023-11-08 VITALS — BP 130/80 | HR 71 | Temp 98.0°F | Resp 17 | Ht 63.0 in | Wt 190.6 lb

## 2023-11-08 DIAGNOSIS — J0110 Acute frontal sinusitis, unspecified: Secondary | ICD-10-CM

## 2023-11-08 DIAGNOSIS — C9001 Multiple myeloma in remission: Secondary | ICD-10-CM

## 2023-11-08 DIAGNOSIS — J988 Other specified respiratory disorders: Secondary | ICD-10-CM

## 2023-11-08 DIAGNOSIS — R051 Acute cough: Secondary | ICD-10-CM | POA: Diagnosis not present

## 2023-11-08 MED ORDER — AZITHROMYCIN 250 MG PO TABS: ORAL_TABLET | ORAL | 1 refills | Status: DC

## 2023-11-08 MED ORDER — PREDNISONE 10 MG PO TABS: ORAL_TABLET | ORAL | 0 refills | Status: DC

## 2023-11-08 MED ORDER — PROMETHAZINE-DM 6.25-15 MG/5ML PO SYRP: 5.0000 mL | ORAL_SOLUTION | Freq: Four times a day (QID) | ORAL | 0 refills | Status: DC | PRN

## 2023-11-08 NOTE — Progress Notes (Deleted)
Assessment and Plan:  Alyssa Dudley was seen today for an episodic visit.  Diagnoses and all order for this visit:  Acute non-recurrent frontal sinusitis (Primary) Start tmt with abx given length of symptoms and hx of multiple myeloma/immunocompromised. Discussed importance of taking in full to reduce risk for abx resistance. Stay well hydrated to keep mucus thin and productive.  - azithromycin (ZITHROMAX) 250 MG tablet; Take 2 tablets on  Day 1,  followed by 1 tablet  daily for 4 more days    for Sinusitis  /Bronchitis  Dispense: 6 each; Refill: 1  Congestion of upper airway Start steroid taper to aide in reducing inflammation and overall congestion.  Stay well hydrated to keep mucus thin and productive.  - predniSONE (DELTASONE) 10 MG tablet; 1 tab 3 x day for 2 days, then 1 tab 2 x day for 2 days, then 1 tab 1 x day for 3 days  Dispense: 13 tablet; Refill: 0  Acute cough Start cough syrup for the temporary relief of cough. Stay well hydrated to keep any mucus thin an d productive. Coughing can be cuased by several factors including   breathing in things that bother (irritate) your lungs.  Allergies.  Asthma.  Mucus that runs down the back of your throat (postnasal drip).  Smoking/smoke.  Acid backing up from the stomach into the tube that moves food from the mouth to the stomach (gastroesophageal reflux). A cough can linger for 3 weeks. Watch for any changes in your cough and contact office if noticed including blood, pus, pain, night sweats. Cover your mouth when you cough. If the air is dry, use a cool mist vaporizer or humidifier in your home. If your cough is worse at night, try using extra pillows to raise your head up higher while you sleep. Call 911 or report to ER if you start to have difficulty breathing.   - promethazine-dextromethorphan (PROMETHAZINE-DM) 6.25-15 MG/5ML syrup; Take 5 mLs by mouth 4 (four) times daily as needed for cough.  Dispense: 240 mL; Refill:  0  Multiple Myeloma  Oncology following  Continue Drazalex Faspro as directed - last infusion 10/29/23. Continue to practice universal precautions.   Notify office for further evaluation and treatment, questions or concerns if s/s fail to improve. The risks and benefits of my recommendations, as well as other treatment options were discussed with the patient today. Questions were answered.  Further disposition pending results of labs. Discussed med's effects and SE's.    Over 20 minutes of exam, counseling, chart review, and critical decision making was performed.   Future Appointments  Date Time Provider Department Center  11/08/2023  9:00 AM Alyssa Glimpse, NP [facility] None  11/21/2023  9:30 AM Alyssa Dick, NP [facility] None  11/25/2023 12:30 PM [facility] LAB [facility] None  11/25/2023  1:00 PM Alyssa Mood, MD [facility] None  11/25/2023  1:45 PM [facility] INFUSION [facility] None  01/09/2024  7:00 AM [facility] DEVICE REMOTES [facility]  01/28/2024  9:00 AM [facility] LAB [facility] None  01/28/2024  9:30 AM Dudley, Alyssa E, NP [facility] None  01/28/2024 10:30 AM [facility] INFUSION [facility] None  04/09/2024  7:00 AM [facility] DEVICE REMOTES [facility]  04/16/2024  9:00 AM Alyssa Dick, NP [facility] None  07/09/2024  7:00 AM [facility] DEVICE REMOTES [facility]  10/09/2024  7:00 AM [facility] DEVICE REMOTES [facility]  01/08/2025  7:00 AM [facility] DEVICE REMOTES [facility]  04/08/2025  7:00 AM [facility] DEVICE REMOTES [facility]  07/08/2025  7:00 AM [facility] DEVICE REMOTES [facility]    ------------------------------------------------------------------------------------------------------------------   HPI BP 130/80   Pulse 71   Temp 98 F (36.7 C)   Resp 17   Ht 5\' 3"  (1.6 m)   Wt 190 lb 9.6 oz (86.5 kg)   SpO2 99%   BMI 33.76 kg/m   Patient  complains of symptoms of a URI, possible sinusitis. Symptoms include congestion, cough described as waxing and waning over time, nasal congestion, post nasal drip, productive cough with  white and yellow colored sputum, sinus pressure, and sore throat. Onset of symptoms was 8 days ago, and has been unchanged since that time. Treatment to date: antihistamines and decongestants.  Denies fever, chills, N/V, rash.  She has a hx of multiple myeloma, currently following oncology, Dr Alyssa Dudley, with [facility] and being treated with immunotherapy Darzalex faspro - last infusion 10/29/23.    Past Medical History:  Diagnosis Date   Allergy    once a year    Hyperlipidemia    borderline- no meds    Pacemaker    Paroxysmal atrial fibrillation (HCC) 6/15   detected on ppm interrogation   PONV (postoperative nausea and vomiting)    has vomitted in past   Second degree Mobitz II AV block    Vitamin D deficiency      No Known Allergies  Current Outpatient Medications on File Prior to Visit  Medication Sig   acyclovir (ZOVIRAX) 400 MG tablet Take 1 tablet (400 mg total) by mouth 2 (two) times daily.   aspirin 81 MG chewable tablet Chew 81 mg by mouth daily.   atenolol (TENORMIN) 50 MG tablet TAKE 1 TABLET BY MOUTH DAILY FOR BLOOD PRESSURE   Calcium Carb-Cholecalciferol (CALCIUM + VITAMIN D3 PO) Take 1 tablet by mouth daily.   lenalidomide (REVLIMID) 10 MG capsule Take 1 capsule (10 mg total) by mouth daily. Take 1 capsules (10 mg total) by mouth daily for 21 days with 7 days off for a 28 day cycle.  Alyssa Dudley # [id number]    Date Obtained 10/21/2023   Multiple Vitamin (MULTIVITAMIN) tablet Take 1 tablet by mouth daily.   triamcinolone cream (KENALOG) 0.1 % APPLY EXTERNALLY TO THE AFFECTED AREA THREE TIMES DAILY AS NEEDED FOR RASH   Vitamin D, Ergocalciferol, (DRISDOL) 1.25 MG (50000 UNIT) CAPS capsule Take 1 capsule (50,000 Units total) by mouth every 7 (seven) days.   Current  Facility-Administered Medications on File Prior to Visit  Medication   0.9 %  sodium chloride infusion    ROS: all negative except what is noted in the HPI.   Physical Exam:  BP 130/80   Pulse 71   Temp 98 F (36.7 C)   Resp 17   Ht 5\' 3"  (1.6 m)   Wt 190 lb 9.6 oz (86.5 kg)   SpO2 99%   BMI 33.76 kg/m   General Appearance: NAD.  Awake, conversant and cooperative. Eyes: PERRLA, EOMs intact.  Sclera white.  Conjunctiva without erythema. Sinuses: Frontal/maxillary tenderness.  No nasal discharge. Nares patent.  ENT/Mouth: Ext aud canals clear.  Bilateral TMs w/DOL and without erythema or bulging. Hearing intact.  Posterior pharynx with mild erythema no swelling or exudate.  Tonsils without swelling or erythema.  Neck: Supple.  No masses, nodules or thyromegaly. Respiratory: Effort is regular with non-labored breathing. Breath sounds are equal bilaterally without rales, rhonchi, wheezing or stridor.  Cardio: RRR with no MRGs. Brisk peripheral pulses without edema.  Abdomen: Active BS  in all four quadrants.  Soft and non-tender without guarding, rebound tenderness, hernias or masses. Lymphatics: Non tender without lymphadenopathy.  Musculoskeletal: Full ROM, 5/5 strength, normal ambulation.  No clubbing or cyanosis. Skin: Appropriate color for ethnicity. Warm without rashes, lesions, ecchymosis, ulcers.  Neuro: CN II-XII grossly normal. Normal muscle tone without cerebellar symptoms and intact sensation.   Psych: AO X 3,  appropriate Dudley and affect, insight and judgment.     Alyssa Glimpse, NP 8:57 AM [facility]

## 2023-11-08 NOTE — Patient Instructions (Signed)

## 2023-11-10 NOTE — Progress Notes (Signed)
Assessment and Plan:  Alyssa Dudley was seen today for an episodic visit.  Diagnoses and all order for this visit:  1. Acute non-recurrent frontal sinusitis (Primary) Start tmt with abx - take in full and as directed to reduce abx resistance. Stay well hydrated to keep mucus thin and productive  - azithromycin (ZITHROMAX) 250 MG tablet; Take 2 tablets on  Day 1,  followed by 1 tablet  daily for 4 more days    for Sinusitis  /Bronchitis  Dispense: 6 each; Refill: 1  2. Congestion of upper airway Start steroid taper to decrease inflammation and open airway.  - predniSONE (DELTASONE) 10 MG tablet; 1 tab 3 x day for 2 days, then 1 tab 2 x day for 2 days, then 1 tab 1 x day for 3 days  Dispense: 13 tablet; Refill: 0  3. Acute cough Start promethazine cough syrup for the temporary relief of cough. Stay well hydrated to keep any mucus thin an d productive. Coughing can be cuased by several factors including   breathing in things that bother (irritate) your lungs.  Allergies.  Asthma.  Mucus that runs down the back of your throat (postnasal drip).  Smoking/smoke.  Acid backing up from the stomach into the tube that moves food from the mouth to the stomach (gastroesophageal reflux). A cough can linger for 3 weeks. Watch for any changes in your cough and contact office if noticed including blood, pus, pain, night sweats. Cover your mouth when you cough. If the air is dry, use a cool mist vaporizer or humidifier in your home. If your cough is worse at night, try using extra pillows to raise your head up higher while you sleep. Call 911 or report to ER if you start to have difficulty breathing.   - promethazine-dextromethorphan (PROMETHAZINE-DM) 6.25-15 MG/5ML syrup; Take 5 mLs by mouth 4 (four) times daily as needed for cough.  Dispense: 240 mL; Refill: 0  4. Multiple myeloma in remission [facility]) Oncology following - last 07/2023 Continue current therapy maintenance daratumumab and  revlimid   Notify office for further evaluation and treatment, questions or concerns if s/s fail to improve. The risks and benefits of my recommendations, as well as other treatment options were discussed with the patient today. Questions were answered.  Further disposition pending results of labs. Discussed med's effects and SE's.    Over 20 minutes of exam, counseling, chart review, and critical decision making was performed.   Future Appointments  Date Time Provider Department Center  11/25/2023 12:30 PM [facility] LAB [facility] None  11/25/2023  1:00 PM Malachy Mood, MD [facility] None  11/25/2023  1:45 PM [facility] INFUSION [facility] None  01/09/2024  7:00 AM [facility] DEVICE REMOTES [facility]  01/28/2024  9:00 AM [facility] LAB [facility] None  01/28/2024  9:30 AM Carlean Jews, NP [facility] None  01/28/2024 10:30 AM [facility] INFUSION [facility] None  04/09/2024  7:00 AM [facility] DEVICE REMOTES [facility]  04/16/2024  9:00 AM Raynelle Dick, NP [facility] None  07/09/2024  7:00 AM [facility] DEVICE REMOTES [facility]  10/09/2024  7:00 AM [facility] DEVICE REMOTES [facility]  01/08/2025  7:00 AM [facility] DEVICE REMOTES [facility]  04/08/2025  7:00 AM [facility] DEVICE REMOTES [facility]  07/08/2025  7:00 AM [facility] DEVICE REMOTES [facility]    ------------------------------------------------------------------------------------------------------------------   HPI BP 130/80   Pulse 71   Temp 98 F (36.7 C)   Resp 17   Ht 5\' 3"  (1.6 m)   Wt  190 lb 9.6 oz (86.5 kg)   SpO2 99%   BMI 33.76 kg/m   Patient complains of symptoms of a URI, possible sinusitis. Symptoms include congestion, cough described as waxing and waning over time, nasal congestion, sinus pressure, and sneezing. Onset of symptoms was 5 days ago, and has been unchanged since that time. Treatment  to date: antihistamines and decongestants. Denies fever, chills.    She has a hx of multiple myeloma, current undergoing maintenance therapy with daratumumab and revlimid.  Following [facility], Dr. Mosetta Putt, last 08/05/23.  Past Medical History:  Diagnosis Date   Allergy    once a year    Hyperlipidemia    borderline- no meds    Pacemaker    Paroxysmal atrial fibrillation (HCC) 6/15   detected on ppm interrogation   PONV (postoperative nausea and vomiting)    has vomitted in past   Second degree Mobitz II AV block    Vitamin D deficiency      No Known Allergies  Current Outpatient Medications on File Prior to Visit  Medication Sig   acyclovir (ZOVIRAX) 400 MG tablet Take 1 tablet (400 mg total) by mouth 2 (two) times daily.   aspirin 81 MG chewable tablet Chew 81 mg by mouth daily.   atenolol (TENORMIN) 50 MG tablet TAKE 1 TABLET BY MOUTH DAILY FOR BLOOD PRESSURE   Calcium Carb-Cholecalciferol (CALCIUM + VITAMIN D3 PO) Take 1 tablet by mouth daily.   lenalidomide (REVLIMID) 10 MG capsule Take 1 capsule (10 mg total) by mouth daily. Take 1 capsules (10 mg total) by mouth daily for 21 days with 7 days off for a 28 day cycle.  Siri Cole # [id number]    Date Obtained 10/21/2023   Multiple Vitamin (MULTIVITAMIN) tablet Take 1 tablet by mouth daily.   triamcinolone cream (KENALOG) 0.1 % APPLY EXTERNALLY TO THE AFFECTED AREA THREE TIMES DAILY AS NEEDED FOR RASH   Vitamin D, Ergocalciferol, (DRISDOL) 1.25 MG (50000 UNIT) CAPS capsule Take 1 capsule (50,000 Units total) by mouth every 7 (seven) days.   Current Facility-Administered Medications on File Prior to Visit  Medication   0.9 %  sodium chloride infusion    ROS: all negative except what is noted in the HPI.   Physical Exam:  BP 130/80   Pulse 71   Temp 98 F (36.7 C)   Resp 17   Ht 5\' 3"  (1.6 m)   Wt 190 lb 9.6 oz (86.5 kg)   SpO2 99%   BMI 33.76 kg/m   General Appearance: NAD.  Awake,  conversant and cooperative. Eyes: PERRLA, EOMs intact.  Sclera white.  Conjunctiva without erythema. Sinuses: Frontal/maxillary tenderness.  No nasal discharge. Nares patent.  ENT/Mouth: Ext aud canals clear.  Bilateral TMs w/DOL and without erythema or bulging. Hearing intact.  Posterior pharynx without swelling or exudate.  Tonsils without swelling or erythema.  Neck: Supple.  No masses, nodules or thyromegaly. Respiratory: Effort is regular with non-labored breathing. Breath sounds are equal bilaterally with scattered wheezing upon posterior right exhalation. No stridor.  Cardio: RRR with no MRGs. Brisk peripheral pulses without edema.  Abdomen: Active BS in all four quadrants.  Soft and non-tender without guarding, rebound tenderness, hernias or masses. Lymphatics: Non tender without lymphadenopathy.  Musculoskeletal: Full ROM, 5/5 strength, normal ambulation.  No clubbing or cyanosis. Skin: Appropriate color for ethnicity. Warm without rashes, lesions, ecchymosis, ulcers.  Neuro: CN II-XII grossly normal. Normal muscle tone without cerebellar symptoms and intact sensation.  Psych: AO X 3,  appropriate mood and affect, insight and judgment.     Adela Glimpse, NP 9:08 PM [facility]

## 2023-11-11 ENCOUNTER — Ambulatory Visit: Payer: BC Managed Care – PPO

## 2023-11-21 ENCOUNTER — Ambulatory Visit: Payer: BLUE CROSS/BLUE SHIELD | Admitting: Nurse Practitioner

## 2023-11-21 NOTE — Progress Notes (Signed)
Remote pacemaker transmission.

## 2023-11-21 NOTE — Addendum Note (Signed)
Addended by: Elease Etienne A on: 11/21/2023 09:35 AM   Modules accepted: Orders

## 2023-11-24 NOTE — Assessment & Plan Note (Signed)
IgG lambda type, stage II, high risk -Diagnosed with CRAB criteria and bone marrow bx 05/01/2022, pathology confirmed plasma cell myeloma (60% plasma cells). Cytogenics and FISH c/w standard risk multiple myeloma except the deletion of 1P and gain of 1 q. which is a higher risk feature.  She is being treated as high risk -S/p 5 cycles induction DaraVRd starting 05/15/22 followed by autologous stem cell transplant 10/12/22 by Dr. Rosaria Ferries. Post transplant PET and marrow showed no evidence of disease -she received one cycle of DaraVRd to complete a total of 6 cycles -will continue dara and revlimid maintenance, with daratumumab injection 1800 mg every month for 2 years and Revlimid 10 mg on days 1-21 every 28 days, she started on March 18, 2023. -Will check her multiple myeloma labs and f/u every 2 months, M protein negative and light chain levels normal on 10/2023, she remains in remission.  -She will also follow-up with her transplant team

## 2023-11-25 ENCOUNTER — Encounter: Payer: Self-pay | Admitting: Hematology

## 2023-11-25 ENCOUNTER — Other Ambulatory Visit: Payer: Self-pay

## 2023-11-25 ENCOUNTER — Inpatient Hospital Stay: Payer: Medicare Other

## 2023-11-25 ENCOUNTER — Inpatient Hospital Stay: Payer: Medicare Other | Attending: Hematology

## 2023-11-25 ENCOUNTER — Other Ambulatory Visit: Payer: Medicare Other

## 2023-11-25 ENCOUNTER — Inpatient Hospital Stay (HOSPITAL_BASED_OUTPATIENT_CLINIC_OR_DEPARTMENT_OTHER): Payer: Medicare Other | Admitting: Hematology

## 2023-11-25 VITALS — BP 153/65 | HR 60 | Temp 97.6°F | Resp 16 | Wt 190.1 lb

## 2023-11-25 DIAGNOSIS — Z5112 Encounter for antineoplastic immunotherapy: Secondary | ICD-10-CM | POA: Insufficient documentation

## 2023-11-25 DIAGNOSIS — C9001 Multiple myeloma in remission: Secondary | ICD-10-CM | POA: Diagnosis not present

## 2023-11-25 DIAGNOSIS — C9000 Multiple myeloma not having achieved remission: Secondary | ICD-10-CM

## 2023-11-25 DIAGNOSIS — Z79620 Long term (current) use of immunosuppressive biologic: Secondary | ICD-10-CM | POA: Diagnosis not present

## 2023-11-25 LAB — COMPREHENSIVE METABOLIC PANEL
ALT: 23 U/L (ref 0–44)
AST: 20 U/L (ref 15–41)
Albumin: 3.9 g/dL (ref 3.5–5.0)
Alkaline Phosphatase: 102 U/L (ref 38–126)
Anion gap: 5 (ref 5–15)
BUN: 11 mg/dL (ref 8–23)
CO2: 31 mmol/L (ref 22–32)
Calcium: 9 mg/dL (ref 8.9–10.3)
Chloride: 107 mmol/L (ref 98–111)
Creatinine, Ser: 0.71 mg/dL (ref 0.44–1.00)
GFR, Estimated: >60 mL/min (ref >60)
Glucose, Bld: 83 mg/dL (ref 70–99)
Potassium: 3.9 mmol/L (ref 3.5–5.1)
Sodium: 143 mmol/L (ref 135–145)
Total Bilirubin: 0.4 mg/dL (ref 0.0–1.2)
Total Protein: 5.9 g/dL — ABNORMAL LOW (ref 6.5–8.1)

## 2023-11-25 LAB — CBC WITH DIFFERENTIAL (CANCER CENTER ONLY)
Abs Immature Granulocytes: 0 10*3/uL (ref 0.00–0.07)
Basophils Absolute: 0 10*3/uL (ref 0.0–0.1)
Basophils Relative: 0 %
Eosinophils Absolute: 0 10*3/uL (ref 0.0–0.5)
Eosinophils Relative: 1 %
HCT: 35.6 % — ABNORMAL LOW (ref 36.0–46.0)
Hemoglobin: 12.2 g/dL (ref 12.0–15.0)
Immature Granulocytes: 0 %
Lymphocytes Relative: 66 %
Lymphs Abs: 1.9 10*3/uL (ref 0.7–4.0)
MCH: 31 pg (ref 26.0–34.0)
MCHC: 34.3 g/dL (ref 30.0–36.0)
MCV: 90.6 fL (ref 80.0–100.0)
Monocytes Absolute: 0.4 10*3/uL (ref 0.1–1.0)
Monocytes Relative: 15 %
Neutro Abs: 0.5 10*3/uL — ABNORMAL LOW (ref 1.7–7.7)
Neutrophils Relative %: 18 %
Platelet Count: 197 10*3/uL (ref 150–400)
RBC: 3.93 MIL/uL (ref 3.87–5.11)
RDW: 15.9 % — ABNORMAL HIGH (ref 11.5–15.5)
WBC Count: 2.8 10*3/uL — ABNORMAL LOW (ref 4.0–10.5)
nRBC: 0 % (ref 0.0–0.2)

## 2023-11-25 MED ORDER — HYDROCHLOROTHIAZIDE 12.5 MG PO CAPS: 12.5000 mg | ORAL_CAPSULE | Freq: Every day | ORAL | 2 refills | Status: DC

## 2023-11-25 MED ORDER — DARATUMUMAB-HYALURONIDASE-FIHJ 1800-30000 MG-UT/15ML ~~LOC~~ SOLN
1800.0000 mg | Freq: Once | SUBCUTANEOUS | Status: AC
  Administered 2023-11-25: 1800 mg via SUBCUTANEOUS
  Filled 2023-11-25: qty 15

## 2023-11-25 NOTE — Patient Instructions (Signed)

## 2023-11-25 NOTE — Progress Notes (Signed)
Per Dr Mosetta Putt ok to treat with ANC of 0.5 today.  Patient states she took dexamethasone at home at 10:00 this morning.

## 2023-11-25 NOTE — Progress Notes (Signed)
[facility]   [phone] [phone]   Clinic Follow up Note   Patient Care Team: Lucky Cowboy, MD as PCP - General (Internal Medicine) Lanier Prude, MD as PCP - Electrophysiology (Cardiology) Mardella Layman, MD as Consulting Physician (Gastroenterology) Gelene Mink, OD as Referring Physician (Optometry) Pricilla Riffle, MD as Consulting Physician (Cardiology) Cindee Salt, MD as Consulting Physician (Orthopedic Surgery) Reece Levy, MD as Referring Physician (Hematology and Oncology) Malachy Mood, MD as Attending Physician (Hematology and Oncology)  Date of Service:  11/25/2023  CHIEF COMPLAINT: f/u of MM  CURRENT THERAPY:  Maintenance Revlimid and daratumumab  Oncology History   Multiple myeloma (HCC)  IgG lambda type, stage II, high risk -Diagnosed with CRAB criteria and bone marrow bx 05/01/2022, pathology confirmed plasma cell myeloma (60% plasma cells). Cytogenics and FISH c/w standard risk multiple myeloma except the deletion of 1P and gain of 1 q. which is a higher risk feature.  She is being treated as high risk -S/p 5 cycles induction DaraVRd starting 05/15/22 followed by autologous stem cell transplant 10/12/22 by Dr. Rosaria Ferries. Post transplant PET and marrow showed no evidence of disease -she received one cycle of DaraVRd to complete a total of 6 cycles -will continue dara and revlimid maintenance, with daratumumab injection 1800 mg every month for 2 years and Revlimid 10 mg on days 1-21 every 28 days, she started on March 18, 2023. -Will check her multiple myeloma labs and f/u every 2 months, M protein negative and light chain levels normal on 10/2023, she remains in remission.  -She will also follow-up with her transplant team   Assessment and Plan    Multiple Myeloma 68 year old female with multiple myeloma in remission. Last assessment in January 2025 showed negative protein and stable light chain levels. She is on  maintenance therapy with Revlimid, which is crucial to prevent recurrence. Patient reported pharmacy delays in receiving medication. Most patients continue Revlimid indefinitely to maintain remission. - Refill Revlimid - Ensure prompt delivery of Revlimid - Continue follow-up every 2-3 months  Hypertension Consistently elevated blood pressure readings at home (150/90 mmHg) despite atenolol 50 mg. Atenolol dosage cannot be increased due to bradycardia (current heart rate 60 bpm). Discussed adding hydrochlorothiazide, effective in African American patients, with potential side effects including hypokalemia and gout. Need for monitoring potassium levels. - Prescribe hydrochlorothiazide 12.5 mg - Monitor blood pressure at home - Check potassium levels at next lab visit  General Health Maintenance Up-to-date with flu and COVID vaccinations. No new infection symptoms. Patient consistently wears a mask due to work environment exposure risks. - Continue current vaccination schedule - Maintain preventive measures against infections  Plan -Lab reviewed, adequate for treatment, will proceed with daratumumab injection and continue every 4 weeks -Continue Revlimid, will call her pharmacy to make sure she receives refills -I called in hydrocortisone 12.5 mg daily for her uncontrolled hypertension, while she is waiting to establish care with a new primary care physician -Lab and follow-up in 8 weeks      SUMMARY OF ONCOLOGIC HISTORY: Oncology History Overview Note   Cancer Staging  Multiple myeloma (HCC) Staging form: Plasma Cell Myeloma and Plasma Cell Disorders, AJCC 8th Edition - Clinical stage from 05/01/2022: RISS Stage II (Beta-2-microglobulin (mg/L): 3.2, Albumin (g/dL): 3.6, ISS: Stage I, High-risk cytogenetics: Absent, LDH: Elevated) - Signed by Malachy Mood, MD on 06/03/2022 Stage prefix: Initial diagnosis Beta 2 microglobulin range (mg/L): Less than 3.5 Albumin range (g/dL): Greater than or  equal  to 3.5 Cytogenetics: 1p deletion     Multiple myeloma (HCC)  05/01/2022 Cancer Staging   Staging form: Plasma Cell Myeloma and Plasma Cell Disorders, AJCC 8th Edition - Clinical stage from 05/01/2022: RISS Stage II (Beta-2-microglobulin (mg/L): 3.2, Albumin (g/dL): 3.6, ISS: Stage I, High-risk cytogenetics: Absent, LDH: Elevated) - Signed by Malachy Mood, MD on 06/03/2022 Stage prefix: Initial diagnosis Beta 2 microglobulin range (mg/L): Less than 3.5 Albumin range (g/dL): Greater than or equal to 3.5 Cytogenetics: 1p deletion   05/08/2022 Initial Diagnosis   Multiple myeloma (HCC)   05/14/2022 - 02/28/2023 Chemotherapy   Patient is on Treatment Plan : MYELOMA NEWLY DIAGNOSED TRANSPLANT CANDIDATE DaraVRd (Daratumumab IV) q21d x 6 Cycles (Induction/Consolidation)     05/15/2022 - 05/28/2022 Chemotherapy   Patient is on Treatment Plan : MYELOMA NEWLY DIAGNOSED TRANSPLANT CANDIDATE DaraVRd (Daratumumab IV) q21d x 6 Cycles (Induction/Consolidation)     03/18/2023 -  Chemotherapy   Patient is on Treatment Plan : MYELOMA Daratumumab SQ q28d        Discussed the use of AI scribe software for clinical note transcription with the patient, who gave verbal consent to proceed.  History of Present Illness   Alyssa Dudley, a 68 year old female with a history of multiple myeloma, presents for a routine follow-up. She reports no new health issues since her last visit. She mentions having a cold and taking Mucinex to manage it. 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 She takes precautions by wearing a mask all the time.  Alyssa Dudley is currently on Revlimid for her multiple myeloma and reports no problems with the medication. Her last multiple myeloma test in January showed that she remains in remission. She mentions a delay in receiving her medication last month due to an issue with the  pharmacy but expects to hear from her soon for her next refill.  Additionally, Alyssa Dudley has been on Atenolol for high blood pressure for about eight years. Recently, she has noticed that her blood pressure readings at home have been consistently high, around 150/90 mmHg. She is concerned about this and is considering whether her medication needs adjustment. She recently lost her primary care physician and is in the process of finding a new one.         All other systems were reviewed with the patient and are negative.  MEDICAL HISTORY:  Past Medical History:  Diagnosis Date   Allergy    once a year    Hyperlipidemia    borderline- no meds    Pacemaker    Paroxysmal atrial fibrillation (HCC) 6/15   detected on ppm interrogation   PONV (postoperative nausea and vomiting)    has vomitted in past   Second degree Mobitz II AV block    Vitamin D deficiency     SURGICAL HISTORY: Past Surgical History:  Procedure Laterality Date   BREAST CYST EXCISION  1980   COLONOSCOPY     EXTRACORPOREAL SHOCK WAVE LITHOTRIPSY Right 12/28/2019   Procedure: RIGHT EXTRACORPOREAL SHOCK WAVE LITHOTRIPSY (ESWL);  Surgeon: Heloise Purpura, MD;  [facility];  Service: Urology;  Laterality: Right;   PERMANENT PACEMAKER INSERTION N/A 01/13/2013   SJM [employer] DR RF implanted by Dr Johney Frame for mobitz II AV block   PLANTAR FASCIECTOMY     POLYPECTOMY     PPM GENERATOR CHANGEOUT N/A 07/08/2023   Procedure:  PPM GENERATOR CHANGEOUT;  Surgeon: Lanier Prude, MD;  Location: [facility] INVASIVE CV LAB;  Service: Cardiovascular;  Laterality: N/A;   WRIST SURGERY Right 2011   removed cyst    I have reviewed the social history and family history with the patient and they are unchanged from previous note.  ALLERGIES:  has no known allergies.  MEDICATIONS:  Current Outpatient Medications  Medication Sig Dispense Refill   hydrochlorothiazide (MICROZIDE) 12.5 MG capsule Take 1 capsule (12.5 mg  total) by mouth daily. 30 capsule 2   acyclovir (ZOVIRAX) 400 MG tablet Take 1 tablet (400 mg total) by mouth 2 (two) times daily. 60 tablet 5   aspirin 81 MG chewable tablet Chew 81 mg by mouth daily.     atenolol (TENORMIN) 50 MG tablet TAKE 1 TABLET BY MOUTH DAILY FOR BLOOD PRESSURE 90 tablet 3   azithromycin (ZITHROMAX) 250 MG tablet Take 2 tablets on  Day 1,  followed by 1 tablet  daily for 4 more days    for Sinusitis  /Bronchitis 6 each 1   Calcium Carb-Cholecalciferol (CALCIUM + VITAMIN D3 PO) Take 1 tablet by mouth daily.     lenalidomide (REVLIMID) 10 MG capsule Take 1 capsule (10 mg total) by mouth daily. Take 1 capsules (10 mg total) by mouth daily for 21 days with 7 days off for a 28 day cycle.  Siri Cole # [id number]    Date Obtained 10/21/2023 28 capsule 0   Multiple Vitamin (MULTIVITAMIN) tablet Take 1 tablet by mouth daily.     predniSONE (DELTASONE) 10 MG tablet 1 tab 3 x day for 2 days, then 1 tab 2 x day for 2 days, then 1 tab 1 x day for 3 days 13 tablet 0   promethazine-dextromethorphan (PROMETHAZINE-DM) 6.25-15 MG/5ML syrup Take 5 mLs by mouth 4 (four) times daily as needed for cough. 240 mL 0   triamcinolone cream (KENALOG) 0.1 % APPLY EXTERNALLY TO THE AFFECTED AREA THREE TIMES DAILY AS NEEDED FOR RASH 30 g 2   Vitamin D, Ergocalciferol, (DRISDOL) 1.25 MG (50000 UNIT) CAPS capsule Take 1 capsule (50,000 Units total) by mouth every 7 (seven) days. 13 capsule 2   Current Facility-Administered Medications  Medication Dose Route Frequency Provider Last Rate Last Admin   0.9 %  sodium chloride infusion  500 mL Intravenous Once Pyrtle, Carie Caddy, MD        PHYSICAL EXAMINATION: ECOG PERFORMANCE STATUS: 0 - Asymptomatic  Vitals:   11/25/23 1322  BP: (!) 153/65  Pulse: 60  Resp: 16  Temp: 97.6 F (36.4 C)  SpO2: 100%   Wt Readings from Last 3 Encounters:  11/25/23 190 lb 1.6 oz (86.2 kg)  11/08/23 190 lb 9.6 oz (86.5 kg)  10/29/23 187 lb 14.4 oz (85.2 kg)      GENERAL:alert, no distress and comfortable SKIN: skin color, texture, turgor are normal, no rashes or significant lesions EYES: normal, Conjunctiva are pink and non-injected, sclera clear NECK: supple, thyroid normal size, non-tender, without nodularity LYMPH:  no palpable lymphadenopathy in the cervical, axillary  LUNGS: clear to auscultation and percussion with normal breathing effort HEART: regular rate & rhythm and no murmurs and no lower extremity edema ABDOMEN:abdomen soft, non-tender and normal bowel sounds Musculoskeletal:no cyanosis of digits and no clubbing  NEURO: alert & oriented x 3 with fluent speech, no focal motor/sensory deficits  Physical Exam   VITALS: P- 60      LABORATORY DATA:  I have reviewed the data as listed  Latest Ref Rng & Units 11/25/2023   12:52 PM 10/29/2023   12:50 PM 10/03/2023   11:36 AM  CBC  WBC 4.0 - 10.5 K/uL 2.8  4.4  3.7   Hemoglobin 12.0 - 15.0 g/dL 16.1  09.6  04.5   Hematocrit 36.0 - 46.0 % 35.6  39.1  36.2   Platelets 150 - 400 K/uL 197  222  210         Latest Ref Rng & Units 11/25/2023   12:52 PM 10/29/2023   12:50 PM 10/03/2023   11:36 AM  CMP  Glucose 70 - 99 mg/dL 83  409  96   BUN 8 - 23 mg/dL 11  10  9    Creatinine 0.44 - 1.00 mg/dL 8.11  9.14  7.82   Sodium 135 - 145 mmol/L 143  142  143   Potassium 3.5 - 5.1 mmol/L 3.9  4.3  4.2   Chloride 98 - 111 mmol/L 107  106  108   CO2 22 - 32 mmol/L 31  30  31    Calcium 8.9 - 10.3 mg/dL 9.0  9.7  9.6   Total Protein 6.5 - 8.1 g/dL 5.9  6.7  6.4   Total Bilirubin 0.0 - 1.2 mg/dL 0.4  1.0  0.6   Alkaline Phos 38 - 126 U/L 102  114  104   AST 15 - 41 U/L 20  15  15    ALT 0 - 44 U/L 23  16  15        RADIOGRAPHIC STUDIES: I have personally reviewed the radiological images as listed and agreed with the findings in the report. No results found.    Orders Placed This Encounter  Procedures   CBC with Differential (Cancer Center Only)    Standing Status:   Future     Expected Date:   12/23/2023    Expiration Date:   12/22/2024   CBC with Differential (Cancer Center Only)    Standing Status:   Future    Expected Date:   01/20/2024    Expiration Date:   01/19/2025   CBC with Differential (Cancer Center Only)    Standing Status:   Future    Expected Date:   02/17/2024    Expiration Date:   02/16/2025   CBC with Differential (Cancer Center Only)    Standing Status:   Future    Expected Date:   03/16/2024    Expiration Date:   03/16/2025   All questions were answered. The patient knows to call the clinic with any problems, questions or concerns. No barriers to learning was detected. The total time spent in the appointment was 25 mins.     Malachy Mood, MD 11/25/2023

## 2023-11-28 ENCOUNTER — Other Ambulatory Visit: Payer: Self-pay | Admitting: Hematology

## 2023-11-29 ENCOUNTER — Other Ambulatory Visit: Payer: Self-pay

## 2023-12-02 ENCOUNTER — Telehealth: Payer: Self-pay

## 2023-12-02 ENCOUNTER — Ambulatory Visit: Payer: Self-pay | Admitting: Internal Medicine

## 2023-12-02 ENCOUNTER — Other Ambulatory Visit: Payer: Self-pay

## 2023-12-02 MED ORDER — LENALIDOMIDE 10 MG PO CAPS
10.0000 mg | ORAL_CAPSULE | Freq: Every day | ORAL | 0 refills | Status: AC
Start: 2023-12-02 — End: 2023-12-23

## 2023-12-02 NOTE — Telephone Encounter (Signed)
 Received voicemail message from [employer] which was transferred to Dr. Latanya Maudlin Nurse's phone from Sherry Ruffing PharmD.  [facility] LVM on Rebecca's phone stating they received the prescription for Revlimid but needed clarification on the prescription.  [facility] stated they need to know the length of the cycle before dispensing the Revlimid to pt.  [facility] requested a return call or a new prescription with the required information.  This nurse resubmitted the prescription and indicating in the prescription note that the pt will take Revlimid 10mg  PO daily for 21 day and 7 days off to complete a 28 day cycle.  [employer] Authorization number provided w/effective date.

## 2023-12-02 NOTE — Telephone Encounter (Signed)
  Chief Complaint: Cough Symptoms: "rattling in chest" productive cough Frequency: 4 weeks Pertinent Negatives: Patient denies CP, SOB Disposition: [] ED /[x] Urgent Care (no appt availability in office) / [] Appointment(In office/virtual)/ []  [facility]/ [] Home Care/ [] Refused Recommended Disposition /[] [facility]/ []  Follow-up with PCP Additional Notes: patient who was a previous Agricultural engineer patient, called with concerns for productive cough and what patient described as "rattling in my chest." Patient is an oncology patient and is very mindful of when she is sick, patient states she was given a z-pac by a PA in her previous office and had started to feel better. Patient states symptoms aren't worse but the cough has lingered. Patient states she is taking Mucinex and concerned with taking too much. Per protocol, patient is recommended to Urgent Care. Patient verbalized understanding of the plan and all questions answered.    Copied from CRM [id number]. Topic: Clinical - Red Word Triage >> Dec 02, 2023  9:53 AM Payton Doughty wrote: Red Word that prompted transfer to Nurse Triage: pt was previous [facility] pt.  Pt having rattling in her chest.  Was seen for this before his office closed.  Ben taking Mucinex too long.  Sx are lingering.  She hears the rattling.   Pt has cough w/ mucus.  Pt has cancer so concerned. Reason for Disposition  Wheezing is present  Answer Assessment - Initial Assessment Questions 1. ONSET: "When did the cough begin?"      4 weeks ago 2. SEVERITY: "How bad is the cough today?"      Worse at night 3. SPUTUM: "Describe the color of your sputum" (none, dry cough; clear, white, yellow, green)     Thick white 4. HEMOPTYSIS: "Are you coughing up any blood?" If so ask: "How much?" (flecks, streaks, tablespoons, etc.)     No 5. DIFFICULTY BREATHING: "Are you having difficulty breathing?" If Yes, ask: "How bad is it?" (e.g., mild, moderate, severe)    - MILD: No SOB at  rest, mild SOB with walking, speaks normally in sentences, can lie down, no retractions, pulse < 100.    - MODERATE: SOB at rest, SOB with minimal exertion and prefers to sit, cannot lie down flat, speaks in phrases, mild retractions, audible wheezing, pulse 100-120.    - SEVERE: Very SOB at rest, speaks in single words, struggling to breathe, sitting hunched forward, retractions, pulse > 120      No 6. FEVER: "Do you have a fever?" If Yes, ask: "What is your temperature, how was it measured, and when did it start?"     No 7. CARDIAC HISTORY: "Do you have any history of heart disease?" (e.g., heart attack, congestive heart failure)      High blood pressure, pacemaker 8. LUNG HISTORY: "Do you have any history of lung disease?"  (e.g., pulmonary embolus, asthma, emphysema)     No 9. PE RISK FACTORS: "Do you have a history of blood clots?" (or: recent major surgery, recent prolonged travel, bedridden)     No 10. OTHER SYMPTOMS: "Do you have any other symptoms?" (e.g., runny nose, wheezing, chest pain)       Wheezing, runny nose 12. TRAVEL: "Have you traveled out of the country in the last month?" (e.g., travel history, exposures)       No  Protocols used: Cough - Acute Productive-A-AH

## 2023-12-13 ENCOUNTER — Telehealth: Payer: Self-pay

## 2023-12-13 LAB — HM MAMMOGRAPHY

## 2023-12-13 NOTE — Telephone Encounter (Signed)
 Unscheduled remote transmission:  PII Normal device function, 25 AMS EGM's c/w oversensing of atrial lead noise, no hx noted, stable sensing and impedance - route to triage Follow up as scheduled LA, CVRS   New noise on RA lead. Device clinic appointment made 3/12 @ 4 when CL is in office. Pt made aware.   Presenting looks fine.

## 2023-12-18 ENCOUNTER — Ambulatory Visit: Attending: Cardiology

## 2023-12-18 DIAGNOSIS — I441 Atrioventricular block, second degree: Secondary | ICD-10-CM

## 2023-12-18 LAB — CUP PACEART INCLINIC DEVICE CHECK
Battery Remaining Longevity: 105 mo
Battery Voltage: 3.01 V
Brady Statistic RA Percent Paced: 27 %
Brady Statistic RV Percent Paced: 99.9 %
Date Time Interrogation Session: 20250312163904
Implantable Lead Connection Status: 753985
Implantable Lead Connection Status: 753985
Implantable Lead Implant Date: 20140408
Implantable Lead Implant Date: 20140408
Implantable Lead Location: 753859
Implantable Lead Location: 753860
Implantable Lead Model: 1944
Implantable Lead Model: 1948
Implantable Pulse Generator Implant Date: 20240930
Lead Channel Impedance Value: 537.5 Ohm
Lead Channel Impedance Value: 600 Ohm
Lead Channel Pacing Threshold Amplitude: 0.5 V
Lead Channel Pacing Threshold Amplitude: 0.5 V
Lead Channel Pacing Threshold Pulse Width: 0.5 ms
Lead Channel Pacing Threshold Pulse Width: 0.5 ms
Lead Channel Sensing Intrinsic Amplitude: 0 mV
Lead Channel Sensing Intrinsic Amplitude: 5 mV
Lead Channel Sensing Intrinsic Amplitude: 9.1 mV
Lead Channel Setting Pacing Amplitude: 2.5 V
Lead Channel Setting Pacing Amplitude: 2.5 V
Lead Channel Setting Pacing Pulse Width: 0.5 ms
Lead Channel Setting Sensing Sensitivity: 4 mV
Pulse Gen Model: 2272
Pulse Gen Serial Number: 8210888

## 2023-12-18 NOTE — Progress Notes (Signed)
 Pt seen in device clinic for atrial lead testing d/t new atrial lead noise noted on remote transmission. Atrial testing of threshold, sensing, and impedance WNL. Noted atrial lead noise with isometrics. Evaluated with Dr. Lalla Brothers.  Will change atrial sensing to unipolar.  Decreased atrial sensitivity to 1mv. Will schedule one month remote check to reevaluate.

## 2023-12-18 NOTE — Patient Instructions (Signed)
 Will schedule one month remote follow up.

## 2023-12-19 ENCOUNTER — Encounter: Payer: Self-pay | Admitting: Cardiology

## 2023-12-20 ENCOUNTER — Encounter: Payer: Self-pay | Admitting: Family

## 2023-12-23 ENCOUNTER — Inpatient Hospital Stay: Payer: Medicare Other | Attending: Hematology

## 2023-12-23 ENCOUNTER — Inpatient Hospital Stay: Payer: Medicare Other

## 2023-12-23 VITALS — BP 143/68 | HR 63 | Temp 98.4°F | Resp 16 | Wt 186.2 lb

## 2023-12-23 DIAGNOSIS — Z5112 Encounter for antineoplastic immunotherapy: Secondary | ICD-10-CM | POA: Insufficient documentation

## 2023-12-23 DIAGNOSIS — Z79620 Long term (current) use of immunosuppressive biologic: Secondary | ICD-10-CM | POA: Insufficient documentation

## 2023-12-23 DIAGNOSIS — C9000 Multiple myeloma not having achieved remission: Secondary | ICD-10-CM

## 2023-12-23 DIAGNOSIS — C9001 Multiple myeloma in remission: Secondary | ICD-10-CM | POA: Diagnosis not present

## 2023-12-23 LAB — CBC WITH DIFFERENTIAL (CANCER CENTER ONLY)
Abs Immature Granulocytes: 0.01 10*3/uL (ref 0.00–0.07)
Basophils Absolute: 0 10*3/uL (ref 0.0–0.1)
Basophils Relative: 1 %
Eosinophils Absolute: 0.1 10*3/uL (ref 0.0–0.5)
Eosinophils Relative: 3 %
HCT: 38.4 % (ref 36.0–46.0)
Hemoglobin: 13.1 g/dL (ref 12.0–15.0)
Immature Granulocytes: 0 %
Lymphocytes Relative: 44 %
Lymphs Abs: 1.6 10*3/uL (ref 0.7–4.0)
MCH: 31.1 pg (ref 26.0–34.0)
MCHC: 34.1 g/dL (ref 30.0–36.0)
MCV: 91.2 fL (ref 80.0–100.0)
Monocytes Absolute: 0.6 10*3/uL (ref 0.1–1.0)
Monocytes Relative: 17 %
Neutro Abs: 1.3 10*3/uL — ABNORMAL LOW (ref 1.7–7.7)
Neutrophils Relative %: 35 %
Platelet Count: 181 10*3/uL (ref 150–400)
RBC: 4.21 MIL/uL (ref 3.87–5.11)
RDW: 15.9 % — ABNORMAL HIGH (ref 11.5–15.5)
WBC Count: 3.6 10*3/uL — ABNORMAL LOW (ref 4.0–10.5)
nRBC: 0 % (ref 0.0–0.2)

## 2023-12-23 LAB — COMPREHENSIVE METABOLIC PANEL
ALT: 22 U/L (ref 0–44)
AST: 20 U/L (ref 15–41)
Albumin: 4.5 g/dL (ref 3.5–5.0)
Alkaline Phosphatase: 116 U/L (ref 38–126)
Anion gap: 7 (ref 5–15)
BUN: 12 mg/dL (ref 8–23)
CO2: 32 mmol/L (ref 22–32)
Calcium: 9.4 mg/dL (ref 8.9–10.3)
Chloride: 104 mmol/L (ref 98–111)
Creatinine, Ser: 0.78 mg/dL (ref 0.44–1.00)
GFR, Estimated: >60 mL/min (ref >60)
Glucose, Bld: 105 mg/dL — ABNORMAL HIGH (ref 70–99)
Potassium: 3.4 mmol/L — ABNORMAL LOW (ref 3.5–5.1)
Sodium: 143 mmol/L (ref 135–145)
Total Bilirubin: 0.5 mg/dL (ref 0.0–1.2)
Total Protein: 6.8 g/dL (ref 6.5–8.1)

## 2023-12-23 MED ORDER — DARATUMUMAB-HYALURONIDASE-FIHJ 1800-30000 MG-UT/15ML ~~LOC~~ SOLN
1800.0000 mg | Freq: Once | SUBCUTANEOUS | Status: AC
  Administered 2023-12-23: 1800 mg via SUBCUTANEOUS
  Filled 2023-12-23: qty 15

## 2023-12-23 NOTE — Progress Notes (Signed)
Patient took premeds at home prior to treatment.

## 2023-12-24 LAB — KAPPA/LAMBDA LIGHT CHAINS
Kappa free light chain: 8.4 mg/L (ref 3.3–19.4)
Kappa, lambda light chain ratio: 1.11 (ref 0.26–1.65)
Lambda free light chains: 7.6 mg/L (ref 5.7–26.3)

## 2023-12-25 LAB — PROTEIN ELECTROPHORESIS, SERUM
A/G Ratio: 1.5 (ref 0.7–1.7)
Albumin ELP: 3.8 g/dL (ref 2.9–4.4)
Alpha-1-Globulin: 0.2 g/dL (ref 0.0–0.4)
Alpha-2-Globulin: 0.8 g/dL (ref 0.4–1.0)
Beta Globulin: 1 g/dL (ref 0.7–1.3)
Gamma Globulin: 0.5 g/dL (ref 0.4–1.8)
Globulin, Total: 2.5 g/dL (ref 2.2–3.9)
Total Protein ELP: 6.3 g/dL (ref 6.0–8.5)

## 2023-12-26 ENCOUNTER — Ambulatory Visit (INDEPENDENT_AMBULATORY_CARE_PROVIDER_SITE_OTHER): Admitting: Family

## 2023-12-26 ENCOUNTER — Encounter: Payer: Self-pay | Admitting: Family

## 2023-12-26 VITALS — BP 129/81 | HR 66 | Temp 97.7°F | Ht 66.0 in | Wt 187.4 lb

## 2023-12-26 DIAGNOSIS — L2989 Other pruritus: Secondary | ICD-10-CM | POA: Insufficient documentation

## 2023-12-26 DIAGNOSIS — C9001 Multiple myeloma in remission: Secondary | ICD-10-CM

## 2023-12-26 DIAGNOSIS — E7849 Other hyperlipidemia: Secondary | ICD-10-CM | POA: Diagnosis not present

## 2023-12-26 DIAGNOSIS — I10 Essential (primary) hypertension: Secondary | ICD-10-CM | POA: Diagnosis not present

## 2023-12-26 DIAGNOSIS — Z1231 Encounter for screening mammogram for malignant neoplasm of breast: Secondary | ICD-10-CM

## 2023-12-26 MED ORDER — HYDROCHLOROTHIAZIDE 12.5 MG PO CAPS: 12.5000 mg | ORAL_CAPSULE | Freq: Every day | ORAL | Status: DC | PRN

## 2023-12-26 MED ORDER — AMLODIPINE BESYLATE 5 MG PO TABS: 5.0000 mg | ORAL_TABLET | Freq: Every day | ORAL | 2 refills | Status: DC

## 2023-12-26 MED ORDER — TRIAMCINOLONE ACETONIDE 0.1 % EX CREA: TOPICAL_CREAM | CUTANEOUS | 2 refills | Status: AC

## 2023-12-26 NOTE — Assessment & Plan Note (Signed)
 Recurrent rash around her neck, only occurs in warm weather managed with triamcinolone cream. - Continue using triamcinolone cream as needed for rash management, refill sent. -F/U prn

## 2023-12-26 NOTE — Patient Instructions (Addendum)
 It was very nice to see you today!   Stop the Hydrochlorothiazide (HCTZ) - and take only as needed if you have some ankle swelling - take for 2 mornings and then stop. I have sent over low dose Amlodipine to start taking every day for your blood pressure. Check your pressure at home and let me know if running less than 105/60 - either number. Goal is between 105-130/60-80. Keep drinking 2 liters of water every day.  I have also sent over a refill of your Triamcinolone cream.  Schedule a 3 month visit for a physical and fasting labs.        PLEASE NOTE:  If you had any lab tests please let us know if you have not heard back within a few days. You may see your results on MyChart before we have a chance to review them but we will give you a call once they are reviewed by [place]. If we ordered any referrals today, please let us know if you have not heard from their office within the next week.

## 2023-12-26 NOTE — Progress Notes (Signed)
 New Patient Office Visit  Subjective:  Patient ID: Alyssa Dudley, female    DOB: 1956-09-06  Age: 68 y.o. MRN: [id number]  CC:  Chief Complaint  Patient presents with   New Patient (Initial Visit)   Rash    Medication refill of triamcinolone cream.   HPI Alyssa Dudley presents for establishing care today. Discussed the use of AI scribe software for clinical note transcription with the patient, who gave verbal consent to proceed.  History of Present Illness The patient, with a history of multiple myeloma, hypertension, and kidney stones, presents for a routine check-up. She is currently in remission from multiple myeloma and is undergoing maintenance therapy with Lenalidomide. She reports taking her medication as prescribed, with a schedule of three weeks on and one week off. She also takes a weekly vitamin D supplement and a daily baby aspirin due to her pacemaker.  The patient's hypertension is managed with hydrochlorothiazide, which was recently switched from atenolol due to headaches. However, she reports a recent decrease in her potassium levels, which she attributes to the new medication.  She also reports an annual rash that occurs in the summer, which she manages with triamcinolone cream as needed. The rash presents as small bumps and is itchy but does not cause discoloration.  The patient is proactive about her health, drinking plenty of water to maintain hydration and kidney function, and limiting her intake of sugary drinks to prevent kidney stones. She also tries to stay active and walks regularly.  Assessment & Plan Multiple Myeloma -  In remission post-stem cell transplant. On Revlimid 10mg  qd maintenance therapy & monthly daratumumab injections. - Continue lenalidomide therapy as prescribed. - Attend monthly follow-up appointments at the cancer center. -Will continue to monitor.  Hypertension - Switched from hydrochlorothiazide to amlodipine due to hypokalemia.  Amlodipine chosen for efficacy in African American patients. - Discontinue hydrochlorothiazide daily, save & use only as needed if having ankle swelling for 2 days then stop. - Initiate amlodipine 5 mg daily in the morning. - Monitor blood pressure at home once the ordered machine arrives, call if BP outside normal parameters (numbers provided). -F/U in 3-10mos or prn  Pruritic Rash - Recurrent rash around her neck, only occurs in warm weather managed with triamcinolone cream. - Continue using triamcinolone cream bid as needed for rash management, refill sent. -F/U prn  General Health Maintenance - Taking Vitamin D and calcium for bone health. Baby aspirin for pacemaker. Dietary modifications for high glucose and kidney stone prevention discussed. - Continue vitamin D and calcium supplementation. - Continue daily baby aspirin. - Continue to monitor glucose levels and advised to reduce intake of carbohydrates and sweets. -Schedule CPE w/fasting labs in 3-4 months  Subjective:    Outpatient Medications Prior to Visit  Medication Sig Dispense Refill   acyclovir (ZOVIRAX) 400 MG tablet Take 1 tablet (400 mg total) by mouth 2 (two) times daily. 60 tablet 5   aspirin 81 MG chewable tablet Chew 81 mg by mouth daily.     Calcium Carb-Cholecalciferol (CALCIUM + VITAMIN D3 PO) Take 1 tablet by mouth daily.     lenalidomide (REVLIMID) 10 MG capsule Take 10 mg by mouth daily.     Multiple Vitamin (MULTIVITAMIN) tablet Take 1 tablet by mouth daily.     Vitamin D, Ergocalciferol, (DRISDOL) 1.25 MG (50000 UNIT) CAPS capsule Take 1 capsule (50,000 Units total) by mouth every 7 (seven) days. 13 capsule 2   hydrochlorothiazide (MICROZIDE) 12.5 MG capsule Take 1  capsule (12.5 mg total) by mouth daily. 30 capsule 2   triamcinolone cream (KENALOG) 0.1 % APPLY EXTERNALLY TO THE AFFECTED AREA THREE TIMES DAILY AS NEEDED FOR RASH 30 g 2   atenolol (TENORMIN) 50 MG tablet TAKE 1 TABLET BY MOUTH DAILY FOR BLOOD  PRESSURE 90 tablet 3   azithromycin (ZITHROMAX) 250 MG tablet Take 2 tablets on  Day 1,  followed by 1 tablet  daily for 4 more days    for Sinusitis  /Bronchitis (Patient not taking: Reported on 12/26/2023) 6 each 1   predniSONE (DELTASONE) 10 MG tablet 1 tab 3 x day for 2 days, then 1 tab 2 x day for 2 days, then 1 tab 1 x day for 3 days (Patient not taking: Reported on 12/26/2023) 13 tablet 0   promethazine-dextromethorphan (PROMETHAZINE-DM) 6.25-15 MG/5ML syrup Take 5 mLs by mouth 4 (four) times daily as needed for cough. (Patient not taking: Reported on 12/26/2023) 240 mL 0   Facility-Administered Medications Prior to Visit  Medication Dose Route Frequency Provider Last Rate Last Admin   0.9 %  sodium chloride infusion  500 mL Intravenous Once Pyrtle, Carie Caddy, MD       Past Medical History:  Diagnosis Date   Allergy    once a year    Arthritis    Cancer (HCC) 05/08/22   CHF (congestive heart failure) (HCC) 2014   Hyperlipidemia    borderline- no meds    Hypertension 03/2015   Pacemaker    Paroxysmal atrial fibrillation (HCC) 03/2014   detected on ppm interrogation   PONV (postoperative nausea and vomiting)    has vomitted in past   Salivary stone 04/05/2021   Second degree Mobitz II AV block    Vitamin D deficiency    Past Surgical History:  Procedure Laterality Date   BREAST CYST EXCISION  1980   CESAREAN SECTION  09/1977   COLONOSCOPY     EXTRACORPOREAL SHOCK WAVE LITHOTRIPSY Right 12/28/2019   Procedure: RIGHT EXTRACORPOREAL SHOCK WAVE LITHOTRIPSY (ESWL);  Surgeon: Heloise Purpura, MD;  Location: [facility];  Service: Urology;  Laterality: Right;   PERMANENT PACEMAKER INSERTION N/A 01/13/2013   SJM [employer] DR RF implanted by Dr Johney Frame for mobitz II AV block   PLANTAR FASCIECTOMY     POLYPECTOMY     PPM GENERATOR CHANGEOUT N/A 07/08/2023   Procedure: PPM GENERATOR CHANGEOUT;  Surgeon: Lanier Prude, MD;  Location: [facility];  Service: Cardiovascular;   Laterality: N/A;   WRIST SURGERY Right 2011   removed cyst    Objective:   Today's Vitals: BP 129/81 (BP Location: Left Arm, Patient Position: Sitting, Cuff Size: Large)   Pulse 66   Temp 97.7 F (36.5 C) (Temporal)   Ht 5\' 6"  (1.676 m)   Wt 187 lb 6.4 oz (85 kg)   SpO2 100%   BMI 30.25 kg/m   Physical Exam Vitals and nursing note reviewed.  Constitutional:      Appearance: Normal appearance. She is obese.  Cardiovascular:     Rate and Rhythm: Normal rate and regular rhythm.  Pulmonary:     Effort: Pulmonary effort is normal.     Breath sounds: Normal breath sounds.  Musculoskeletal:        General: Normal range of motion.  Skin:    General: Skin is warm and dry.     Findings: Rash present. Rash is urticarial (right neck, cluster of small non-erythema bumps, mild scaling).  Neurological:  Mental Status: She is alert.  Psychiatric:        Mood and Affect: Mood normal.        Behavior: Behavior normal.    Meds ordered this encounter  Medications   triamcinolone cream (KENALOG) 0.1 %    Sig: APPLY EXTERNALLY TO THE AFFECTED AREA THREE TIMES DAILY AS NEEDED FOR RASH    Dispense:  30 g    Refill:  2   amLODipine (NORVASC) 5 MG tablet    Sig: Take 1 tablet (5 mg total) by mouth daily.    Dispense:  30 tablet    Refill:  2    D/C HCTZ    Supervising Provider:   ANDY, CAMILLE L [[id number]]   hydrochlorothiazide (MICROZIDE) 12.5 MG capsule    Sig: Take 1 capsule (12.5 mg total) by mouth daily as needed. FOR ANKLE SWELLING -take for 2-3 days, then stop.    Supervising Provider:   ANDY, CAMILLE L [[id number]]    Dulce Sellar, NP

## 2023-12-26 NOTE — Assessment & Plan Note (Signed)
 Switched from hydrochlorothiazide to amlodipine due to hypokalemia. Amlodipine chosen for efficacy in African American patients. - Discontinue hydrochlorothiazide daily, save & use only as needed if having ankle swelling for 2 days then stop. - Initiate amlodipine 5 mg daily in the morning. - Monitor blood pressure at home once the ordered machine arrives, call if BP outside normal parameters (numbers provided). -F/U in 3-78mos or prn

## 2023-12-27 ENCOUNTER — Other Ambulatory Visit: Payer: Self-pay

## 2023-12-27 DIAGNOSIS — C9001 Multiple myeloma in remission: Secondary | ICD-10-CM

## 2023-12-27 MED ORDER — LENALIDOMIDE 10 MG PO CAPS MILLENIUM C16014: 10.0000 mg | ORAL_CAPSULE | Freq: Every day | ORAL | 0 refills | Status: DC

## 2024-01-01 LAB — IFE, DARA-SPECIFIC, SERUM
IgA: 15 mg/dL — ABNORMAL LOW (ref 87–352)
IgG (Immunoglobin G), Serum: 689 mg/dL (ref 586–1602)
IgM (Immunoglobulin M), Srm: 22 mg/dL — ABNORMAL LOW (ref 26–217)

## 2024-01-09 ENCOUNTER — Ambulatory Visit (INDEPENDENT_AMBULATORY_CARE_PROVIDER_SITE_OTHER): Payer: Medicare Other

## 2024-01-09 DIAGNOSIS — I441 Atrioventricular block, second degree: Secondary | ICD-10-CM

## 2024-01-10 LAB — CUP PACEART REMOTE DEVICE CHECK
Battery Remaining Longevity: 106 mo
Battery Remaining Percentage: 95.5 %
Battery Voltage: 3.01 V
Brady Statistic AP VP Percent: 9.5 %
Brady Statistic AP VS Percent: 1 %
Brady Statistic AS VP Percent: 90 %
Brady Statistic AS VS Percent: 1 %
Brady Statistic RA Percent Paced: 9.3 %
Brady Statistic RV Percent Paced: 99 %
Date Time Interrogation Session: 20250403192358
Implantable Lead Connection Status: 753985
Implantable Lead Connection Status: 753985
Implantable Lead Implant Date: 20140408
Implantable Lead Implant Date: 20140408
Implantable Lead Location: 753859
Implantable Lead Location: 753860
Implantable Lead Model: 1944
Implantable Lead Model: 1948
Implantable Pulse Generator Implant Date: 20240930
Lead Channel Impedance Value: 580 Ohm
Lead Channel Impedance Value: 630 Ohm
Lead Channel Pacing Threshold Amplitude: 0.5 V
Lead Channel Pacing Threshold Amplitude: 0.75 V
Lead Channel Pacing Threshold Pulse Width: 0.5 ms
Lead Channel Pacing Threshold Pulse Width: 0.5 ms
Lead Channel Sensing Intrinsic Amplitude: 3.1 mV
Lead Channel Sensing Intrinsic Amplitude: 9.1 mV
Lead Channel Setting Pacing Amplitude: 2.5 V
Lead Channel Setting Pacing Amplitude: 2.5 V
Lead Channel Setting Pacing Pulse Width: 0.5 ms
Lead Channel Setting Sensing Sensitivity: 4 mV
Pulse Gen Model: 2272
Pulse Gen Serial Number: 8210888

## 2024-01-11 ENCOUNTER — Encounter: Payer: Self-pay | Admitting: Cardiology

## 2024-01-20 ENCOUNTER — Ambulatory Visit: Payer: Medicare Other | Admitting: Nurse Practitioner

## 2024-01-20 ENCOUNTER — Ambulatory Visit: Payer: Medicare Other

## 2024-01-20 ENCOUNTER — Other Ambulatory Visit: Payer: Medicare Other

## 2024-01-27 ENCOUNTER — Other Ambulatory Visit: Payer: Self-pay | Admitting: Nurse Practitioner

## 2024-01-27 ENCOUNTER — Ambulatory Visit: Payer: Medicare Other | Admitting: Family

## 2024-01-27 ENCOUNTER — Other Ambulatory Visit: Payer: Self-pay

## 2024-01-27 DIAGNOSIS — C9001 Multiple myeloma in remission: Secondary | ICD-10-CM

## 2024-01-27 MED ORDER — LENALIDOMIDE 10 MG PO CAPS MILLENIUM C16014: 10.0000 mg | ORAL_CAPSULE | Freq: Every day | ORAL | 0 refills | Status: DC

## 2024-01-27 NOTE — Progress Notes (Signed)
 Patient Care Team: Versa Gore, NP as PCP - General (Family Medicine) Boyce Byes, MD as PCP - Electrophysiology (Cardiology) Tessa Figures, MD as Consulting Physician (Gastroenterology) Guinevere Lefevre, OD as Referring Physician (Optometry) Elmyra Haggard, MD as Consulting Physician (Cardiology) Lyanne Sample, MD as Consulting Physician (Orthopedic Surgery) Ollie Bhat, MD as Referring Physician (Hematology and Oncology) Sonja [place], MD as Attending Physician (Hematology and Oncology)  Clinic Day:  01/28/2024  Referring physician: Vangie Genet, MD  ASSESSMENT & PLAN:   Assessment & Plan: Multiple myeloma (HCC)  IgG lambda type, stage II, high risk -Diagnosed with CRAB criteria and bone marrow bx 05/01/2022, pathology confirmed plasma cell myeloma (60% plasma cells). Cytogenics and FISH c/w standard risk multiple myeloma except the deletion of 1P and gain of 1 q. which is a higher risk feature.  She is being treated as high risk -S/p 5 cycles induction DaraVRd starting 05/15/22 followed by autologous stem cell transplant 10/12/22 by Dr. Gaila Josephs. Post transplant PET and marrow showed no evidence of disease -she received one cycle of DaraVRd to complete a total of 6 cycles -will continue dara and revlimid  maintenance, with daratumumab  injection 1800 mg every month for 2 years and Revlimid  10 mg on days 1-21 every 28 days, she started on March 18, 2023. -Will check her multiple myeloma labs and f/u every 2 months, M protein negative and light chain levels normal on -10/2023, she remains in remission.  -12/20/2023 -normal light chains and negative M protein on multiple myeloma panel. -She will also follow-up with her transplant team -Continue Revlimid  10 mg daily on days 1-21 of 28-day cycle. -Labs with follow-up every other month with monthly daratumumab  injections.    Plan:  Reviewed CBC and CMP.  Both unremarkable. Light chain profile from 12/20/2023 was normal.   Myeloma panel from 12/20/2023 showed negative M protein spike.  Awaiting these panels from today's visit.  Should be done every 2 months from this point forward. Continue Revlimid  10 mg daily on days 1-21 of 28-day cycle. Continue maintenance daratumumab  injection monthly. Labs, follow-up, with maintenance daratumumab  every other month.   The patient understands the plans discussed today and is in agreement with them.  She knows to contact our office if she develops concerns prior to her next appointment.  I provided 20 minutes of face-to-face time during this encounter and > 50% was spent counseling as documented under my assessment and plan.    Sharyon Deis, NP   [facility]ER CENTER [facility] - A DEPT OF [facility] [address] [place] [address] Dept: [phone] Dept Fax: [phone]   No orders of the defined types were placed in this encounter.     CHIEF COMPLAINT:  CC: Multiple myeloma  Current Treatment: Maintenance Revlimid  (days 1-21 of 28 day cycle) and daratumumab   INTERVAL HISTORY:  Stefany is here today for repeat clinical assessment.  She was last seen 11/25/2023 by Dr. Maryalice Smaller.  Patient states she continues to feel well.  Has no physical concerns or complaints. She denies chest pain, chest pressure, or shortness of breath. She denies headaches or visual disturbances. She denies abdominal pain, nausea, vomiting, or changes in bowel or bladder habits.   She denies fevers or chills. She denies joint and muscle pain. Her appetite is good. Her weight has been stable.  I have reviewed the past medical history, past surgical history, social history and family history with the patient and they  are unchanged from previous note.  ALLERGIES:  has no known allergies.  MEDICATIONS:  Current Outpatient Medications  Medication Sig Dispense Refill   acyclovir  (ZOVIRAX ) 400 MG tablet Take 1 tablet (400 mg total) by mouth 2  (two) times daily. 60 tablet 5   amLODipine  (NORVASC ) 5 MG tablet Take 1 tablet (5 mg total) by mouth daily. 30 tablet 2   aspirin  81 MG chewable tablet Chew 81 mg by mouth daily.     Calcium  Carb-Cholecalciferol (CALCIUM  + VITAMIN D3 PO) Take 1 tablet by mouth daily.     hydrochlorothiazide  (MICROZIDE ) 12.5 MG capsule Take 1 capsule (12.5 mg total) by mouth daily as needed. FOR ANKLE SWELLING -take for 2-3 days, then stop.     lenalidomide  (REVLIMID ) 10 MG capsule Take 1 capsule (10 mg total) by mouth daily. Take 1 capsule (10 mg total) by mouth daily for 21 days. And then 7 days off for a 28 day cycle.  [employer] Auth # [id number] expires 01/27/2024 21 capsule 0   Multiple Vitamin (MULTIVITAMIN) tablet Take 1 tablet by mouth daily.     triamcinolone  cream (KENALOG ) 0.1 % APPLY EXTERNALLY TO THE AFFECTED AREA THREE TIMES DAILY AS NEEDED FOR RASH 30 g 2   Vitamin D , Ergocalciferol , (DRISDOL ) 1.25 MG (50000 UNIT) CAPS capsule Take 1 capsule (50,000 Units total) by mouth every 7 (seven) days. 13 capsule 2   Current Facility-Administered Medications  Medication Dose Route Frequency Provider Last Rate Last Admin   0.9 %  sodium chloride  infusion  500 mL Intravenous Once Pyrtle, Amber Bail, MD        HISTORY OF PRESENT ILLNESS:   Oncology History Overview Note   Cancer Staging  Multiple myeloma (HCC) Staging form: Plasma Cell Myeloma and Plasma Cell Disorders, AJCC 8th Edition - Clinical stage from 05/01/2022: RISS Stage II (Beta-2 -microglobulin (mg/L): 3.2, Albumin (g/dL): 3.6, ISS: Stage I, High-risk cytogenetics: Absent, LDH: Elevated) - Signed by Sonja Deerfield, MD on 06/03/2022 Stage prefix: Initial diagnosis Beta 2 microglobulin range (mg/L): Less than 3.5 Albumin range (g/dL): Greater than or equal to 3.5 Cytogenetics: 1p deletion     Multiple myeloma (HCC)  05/01/2022 Cancer Staging   Staging form: Plasma Cell Myeloma and Plasma Cell Disorders, AJCC 8th Edition - Clinical stage from 05/01/2022: RISS  Stage II (Beta-2 -microglobulin (mg/L): 3.2, Albumin (g/dL): 3.6, ISS: Stage I, High-risk cytogenetics: Absent, LDH: Elevated) - Signed by Sonja Alpine, MD on 06/03/2022 Stage prefix: Initial diagnosis Beta 2 microglobulin range (mg/L): Less than 3.5 Albumin range (g/dL): Greater than or equal to 3.5 Cytogenetics: 1p deletion   05/08/2022 Initial Diagnosis   Multiple myeloma (HCC)   05/14/2022 - 02/28/2023 Chemotherapy   Patient is on Treatment Plan : MYELOMA NEWLY DIAGNOSED TRANSPLANT CANDIDATE DaraVRd (Daratumumab  IV) q21d x 6 Cycles (Induction/Consolidation)     05/15/2022 - 05/28/2022 Chemotherapy   Patient is on Treatment Plan : MYELOMA NEWLY DIAGNOSED TRANSPLANT CANDIDATE DaraVRd (Daratumumab  IV) q21d x 6 Cycles (Induction/Consolidation)     03/18/2023 -  Chemotherapy   Patient is on Treatment Plan : MYELOMA Daratumumab  SQ q28d         REVIEW OF SYSTEMS:   Constitutional: Denies fevers, chills or abnormal weight loss Eyes: Denies blurriness of vision Ears, nose, mouth, throat, and face: Denies mucositis or sore throat Respiratory: Denies cough, dyspnea or wheezes Cardiovascular: Denies palpitation, chest discomfort or lower extremity swelling Gastrointestinal:  Denies nausea, heartburn or change in bowel habits Skin: Denies abnormal skin rashes Lymphatics: Denies new lymphadenopathy or easy  bruising Neurological:Denies numbness, tingling or new weaknesses Behavioral/Psych: Mood is stable, no new changes  All other systems were reviewed with the patient and are negative.   VITALS:   Today's Vitals   01/28/24 0945  BP: 130/64  Pulse: 73  Resp: 17  Temp: 97.8 F (36.6 C)  SpO2: 99%  Weight: 192 lb 14.4 oz (87.5 kg)  PainSc: 0-No pain   Body mass index is 31.13 kg/m.   Wt Readings from Last 3 Encounters:  01/28/24 192 lb 14.4 oz (87.5 kg)  12/26/23 187 lb 6.4 oz (85 kg)  12/23/23 186 lb 4 oz (84.5 kg)    Body mass index is 31.13 kg/m.  Performance status (ECOG): 0 -  Asymptomatic  PHYSICAL EXAM:   GENERAL:alert, no distress and comfortable SKIN: skin color, texture, turgor are normal, no rashes or significant lesions EYES: normal, Conjunctiva are pink and non-injected, sclera clear OROPHARYNX:no exudate, no erythema and lips, buccal mucosa, and tongue normal  NECK: supple, thyroid  normal size, non-tender, without nodularity LYMPH:  no palpable lymphadenopathy in the cervical, axillary or inguinal LUNGS: clear to auscultation and percussion with normal breathing effort HEART: regular rate & rhythm and no murmurs and no lower extremity edema ABDOMEN:abdomen soft, non-tender and normal bowel sounds Musculoskeletal:no cyanosis of digits and no clubbing  NEURO: alert & oriented x 3 with fluent speech, no focal motor/sensory deficits  LABORATORY DATA:  I have reviewed the data as listed    Component Value Date/Time   NA 142 01/28/2024 0927   NA 146 (H) 07/01/2023 1424   K 3.7 01/28/2024 0927   CL 107 01/28/2024 0927   CO2 31 01/28/2024 0927   GLUCOSE 97 01/28/2024 0927   BUN 9 01/28/2024 0927   BUN 12 07/01/2023 1424   CREATININE 0.75 01/28/2024 0927   CREATININE 0.67 04/17/2023 0950   CALCIUM  9.3 01/28/2024 0927   PROT 6.6 01/28/2024 0927   ALBUMIN 4.3 01/28/2024 0927   AST 18 01/28/2024 0927   ALT 25 01/28/2024 0927   ALKPHOS 129 (H) 01/28/2024 0927   BILITOT 0.8 01/28/2024 0927   GFRNONAA >60 01/28/2024 0927   GFRNONAA 94 04/05/2021 1025   GFRAA 108 04/05/2021 1025    Lab Results  Component Value Date   WBC 5.3 01/28/2024   NEUTROABS 2.4 01/28/2024   HGB 12.6 01/28/2024   HCT 37.2 01/28/2024   MCV 91.4 01/28/2024   PLT 176 01/28/2024    RADIOGRAPHIC STUDIES: CUP PACEART REMOTE DEVICE CHECK Result Date: 01/10/2024 Scheduled remote reviewed. Normal device function.  Presenting rhythm:  AS/VP 23 AMS, 2 noise reversion, longest duration 30sec, ECG's c/w known atrial lead noise.  Programming changes 3/12 Next remote 91 days. LA,  CVRS

## 2024-01-27 NOTE — Assessment & Plan Note (Signed)
 IgG lambda type, stage II, high risk -Diagnosed with CRAB criteria and bone marrow bx 05/01/2022, pathology confirmed plasma cell myeloma (60% plasma cells). Cytogenics and FISH c/w standard risk multiple myeloma except the deletion of 1P and gain of 1 q. which is a higher risk feature.  She is being treated as high risk -S/p 5 cycles induction DaraVRd starting 05/15/22 followed by autologous stem cell transplant 10/12/22 by Dr. Gaila Josephs. Post transplant PET and marrow showed no evidence of disease -she received one cycle of DaraVRd to complete a total of 6 cycles -will continue dara and revlimid  maintenance, with daratumumab  injection 1800 mg every month for 2 years and Revlimid  10 mg on days 1-21 every 28 days, she started on March 18, 2023. -Will check her multiple myeloma labs and f/u every 2 months, M protein negative and light chain levels normal on 10/2023, she remains in remission.  -She will also follow-up with her transplant team

## 2024-01-28 ENCOUNTER — Encounter: Payer: Self-pay | Admitting: Nurse Practitioner

## 2024-01-28 ENCOUNTER — Inpatient Hospital Stay (HOSPITAL_BASED_OUTPATIENT_CLINIC_OR_DEPARTMENT_OTHER): Payer: Medicare Other | Admitting: Nurse Practitioner

## 2024-01-28 ENCOUNTER — Encounter: Payer: Self-pay | Admitting: Hematology

## 2024-01-28 ENCOUNTER — Inpatient Hospital Stay: Payer: Medicare Other | Attending: Hematology

## 2024-01-28 ENCOUNTER — Inpatient Hospital Stay: Payer: Medicare Other

## 2024-01-28 VITALS — BP 130/64 | HR 73 | Temp 97.8°F | Resp 17 | Wt 192.9 lb

## 2024-01-28 DIAGNOSIS — Z79620 Long term (current) use of immunosuppressive biologic: Secondary | ICD-10-CM | POA: Diagnosis not present

## 2024-01-28 DIAGNOSIS — C9000 Multiple myeloma not having achieved remission: Secondary | ICD-10-CM | POA: Insufficient documentation

## 2024-01-28 DIAGNOSIS — C9001 Multiple myeloma in remission: Secondary | ICD-10-CM | POA: Diagnosis not present

## 2024-01-28 DIAGNOSIS — Z5112 Encounter for antineoplastic immunotherapy: Secondary | ICD-10-CM | POA: Insufficient documentation

## 2024-01-28 LAB — CMP (CANCER CENTER ONLY)
ALT: 25 U/L (ref 0–44)
AST: 18 U/L (ref 15–41)
Albumin: 4.3 g/dL (ref 3.5–5.0)
Alkaline Phosphatase: 129 U/L — ABNORMAL HIGH (ref 38–126)
Anion gap: 4 — ABNORMAL LOW (ref 5–15)
BUN: 9 mg/dL (ref 8–23)
CO2: 31 mmol/L (ref 22–32)
Calcium: 9.3 mg/dL (ref 8.9–10.3)
Chloride: 107 mmol/L (ref 98–111)
Creatinine: 0.75 mg/dL (ref 0.44–1.00)
GFR, Estimated: >60 mL/min (ref >60)
Glucose, Bld: 97 mg/dL (ref 70–99)
Potassium: 3.7 mmol/L (ref 3.5–5.1)
Sodium: 142 mmol/L (ref 135–145)
Total Bilirubin: 0.8 mg/dL (ref 0.0–1.2)
Total Protein: 6.6 g/dL (ref 6.5–8.1)

## 2024-01-28 LAB — CBC WITH DIFFERENTIAL (CANCER CENTER ONLY)
Abs Immature Granulocytes: 0.01 10*3/uL (ref 0.00–0.07)
Basophils Absolute: 0 10*3/uL (ref 0.0–0.1)
Basophils Relative: 1 %
Eosinophils Absolute: 0.2 10*3/uL (ref 0.0–0.5)
Eosinophils Relative: 4 %
HCT: 37.2 % (ref 36.0–46.0)
Hemoglobin: 12.6 g/dL (ref 12.0–15.0)
Immature Granulocytes: 0 %
Lymphocytes Relative: 33 %
Lymphs Abs: 1.7 10*3/uL (ref 0.7–4.0)
MCH: 31 pg (ref 26.0–34.0)
MCHC: 33.9 g/dL (ref 30.0–36.0)
MCV: 91.4 fL (ref 80.0–100.0)
Monocytes Absolute: 0.9 10*3/uL (ref 0.1–1.0)
Monocytes Relative: 17 %
Neutro Abs: 2.4 10*3/uL (ref 1.7–7.7)
Neutrophils Relative %: 45 %
Platelet Count: 176 10*3/uL (ref 150–400)
RBC: 4.07 MIL/uL (ref 3.87–5.11)
RDW: 15.7 % — ABNORMAL HIGH (ref 11.5–15.5)
WBC Count: 5.3 10*3/uL (ref 4.0–10.5)
nRBC: 0 % (ref 0.0–0.2)

## 2024-01-28 MED ORDER — DARATUMUMAB-HYALURONIDASE-FIHJ 1800-30000 MG-UT/15ML ~~LOC~~ SOLN
1800.0000 mg | Freq: Once | SUBCUTANEOUS | Status: AC
  Administered 2024-01-28: 1800 mg via SUBCUTANEOUS
  Filled 2024-01-28: qty 15

## 2024-01-28 NOTE — Patient Instructions (Signed)
Daratumumab; Hyaluronidase Injection What is this medication? DARATUMUMAB; HYALURONIDASE (dar a toom ue mab; hye al ur ON i dase) treats multiple myeloma, a type of bone marrow cancer. Daratumumab works by blocking a protein that causes cancer cells to grow and multiply. This helps to slow or stop the spread of cancer cells. Hyaluronidase works by increasing the absorption of other medications in the body to help them work better. This medication may also be used treat amyloidosis, a condition that causes the buildup of a protein (amyloid) in your body. It works by reducing the buildup of this protein, which decreases symptoms. It is a combination medication that contains a monoclonal antibody. This medicine may be used for other purposes; ask your health care provider or pharmacist if you have questions. COMMON BRAND NAME(S): DARZALEX FASPRO What should I tell my care team before I take this medication? They need to know if you have any of these conditions: Heart disease Infection, such as chickenpox, cold sores, herpes, hepatitis B Lung or breathing disease An unusual or allergic reaction to daratumumab, hyaluronidase, other medications, foods, dyes, or preservatives Pregnant or trying to get pregnant Breast-feeding How should I use this medication? This medication is injected under the skin. It is given by your care team in a hospital or clinic setting. Talk to your care team about the use of this medication in children. Special care may be needed. Overdosage: If you think you have taken too much of this medicine contact a poison control center or emergency room at once. NOTE: This medicine is only for you. Do not share this medicine with others. What if I miss a dose? Keep appointments for follow-up doses. It is important not to miss your dose. Call your care team if you are unable to keep an appointment. What may interact with this medication? Interactions have not been studied. This list  may not describe all possible interactions. Give your health care provider a list of all the medicines, herbs, non-prescription drugs, or dietary supplements you use. Also tell them if you smoke, drink alcohol, or use illegal drugs. Some items may interact with your medicine. What should I watch for while using this medication? Your condition will be monitored carefully while you are receiving this medication. This medication can cause serious allergic reactions. To reduce your risk, your care team may give you other medication to take before receiving this one. Be sure to follow the directions from your care team. This medication can affect the results of blood tests to match your blood type. These changes can last for up to 6 months after the final dose. Your care team will do blood tests to match your blood type before you start treatment. Tell all of your care team that you are being treated with this medication before receiving a blood transfusion. This medication can affect the results of some tests used to determine treatment response; extra tests may be needed to evaluate response. Talk to your care team if you wish to become pregnant or think you are pregnant. This medication can cause serious birth defects if taken during pregnancy and for 3 months after the last dose. A reliable form of contraception is recommended while taking this medication and for 3 months after the last dose. Talk to your care team about effective forms of contraception. Do not breast-feed while taking this medication. What side effects may I notice from receiving this medication? Side effects that you should report to your care team as soon as   possible: Allergic reactions--skin rash, itching, hives, swelling of the face, lips, tongue, or throat Heart rhythm changes--fast or irregular heartbeat, dizziness, feeling faint or lightheaded, chest pain, trouble breathing Infection--fever, chills, cough, sore throat, wounds that  don't heal, pain or trouble when passing urine, general feeling of discomfort or being unwell Infusion reactions--chest pain, shortness of breath or trouble breathing, feeling faint or lightheaded Sudden eye pain or change in vision such as blurry vision, seeing halos around lights, vision loss Unusual bruising or bleeding Side effects that usually do not require medical attention (report to your care team if they continue or are bothersome): Constipation Diarrhea Fatigue Nausea Pain, tingling, or numbness in the hands or feet Swelling of the ankles, hands, or feet This list may not describe all possible side effects. Call your doctor for medical advice about side effects. You may report side effects to FDA at 1-800-FDA-1088. Where should I keep my medication? This medication is given in a hospital or clinic. It will not be stored at home. NOTE: This sheet is a summary. It may not cover all possible information. If you have questions about this medicine, talk to your doctor, pharmacist, or health care provider.  2024 Elsevier/Gold Standard (2022-01-30 00:00:00)  

## 2024-01-28 NOTE — Progress Notes (Signed)
 Pt. states pre medications were taken at home this am, Decadron  20 mg po.

## 2024-01-29 ENCOUNTER — Other Ambulatory Visit: Payer: Self-pay | Admitting: Nurse Practitioner

## 2024-01-29 ENCOUNTER — Telehealth: Payer: Self-pay | Admitting: Hematology

## 2024-01-29 DIAGNOSIS — C9000 Multiple myeloma not having achieved remission: Secondary | ICD-10-CM

## 2024-01-29 LAB — KAPPA/LAMBDA LIGHT CHAINS
Kappa free light chain: 7.1 mg/L (ref 3.3–19.4)
Kappa, lambda light chain ratio: 0.97 (ref 0.26–1.65)
Lambda free light chains: 7.3 mg/L (ref 5.7–26.3)

## 2024-01-29 NOTE — Telephone Encounter (Signed)
 Rescheduled appointments and the patient is aware.

## 2024-01-31 LAB — MULTIPLE MYELOMA PANEL, SERUM
Albumin SerPl Elph-Mcnc: 3.8 g/dL (ref 2.9–4.4)
Albumin/Glob SerPl: 1.6 (ref 0.7–1.7)
Alpha 1: 0.2 g/dL (ref 0.0–0.4)
Alpha2 Glob SerPl Elph-Mcnc: 0.8 g/dL (ref 0.4–1.0)
B-Globulin SerPl Elph-Mcnc: 0.9 g/dL (ref 0.7–1.3)
Gamma Glob SerPl Elph-Mcnc: 0.6 g/dL (ref 0.4–1.8)
Globulin, Total: 2.5 g/dL (ref 2.2–3.9)
IgA: 15 mg/dL — ABNORMAL LOW (ref 87–352)
IgG (Immunoglobin G), Serum: 673 mg/dL (ref 586–1602)
IgM (Immunoglobulin M), Srm: 21 mg/dL — ABNORMAL LOW (ref 26–217)
Total Protein ELP: 6.3 g/dL (ref 6.0–8.5)

## 2024-01-31 NOTE — Telephone Encounter (Signed)
 Instruction letter has been discarded since patient hasn't picked it from our office.

## 2024-02-03 ENCOUNTER — Other Ambulatory Visit: Payer: Self-pay | Admitting: Hematology

## 2024-02-03 DIAGNOSIS — C9001 Multiple myeloma in remission: Secondary | ICD-10-CM

## 2024-02-04 ENCOUNTER — Other Ambulatory Visit: Payer: Self-pay

## 2024-02-04 DIAGNOSIS — C9001 Multiple myeloma in remission: Secondary | ICD-10-CM

## 2024-02-04 MED ORDER — LENALIDOMIDE 10 MG PO CAPS MILLENIUM C16014: 10.0000 mg | ORAL_CAPSULE | Freq: Every day | ORAL | 0 refills | Status: DC

## 2024-02-05 ENCOUNTER — Ambulatory Visit (INDEPENDENT_AMBULATORY_CARE_PROVIDER_SITE_OTHER): Admitting: Family

## 2024-02-05 ENCOUNTER — Encounter: Payer: Self-pay | Admitting: Family

## 2024-02-05 VITALS — BP 146/76 | HR 72 | Temp 98.0°F | Ht 66.0 in | Wt 185.6 lb

## 2024-02-05 DIAGNOSIS — R053 Chronic cough: Secondary | ICD-10-CM | POA: Diagnosis not present

## 2024-02-05 MED ORDER — AZITHROMYCIN 250 MG PO TABS: ORAL_TABLET | ORAL | 0 refills | Status: AC

## 2024-02-05 MED ORDER — PREDNISONE 20 MG PO TABS: ORAL_TABLET | ORAL | 0 refills | Status: DC

## 2024-02-05 NOTE — Progress Notes (Signed)
 Patient ID: Alyssa Dudley, female    DOB: [birth year], 68 y.o.   MRN: [id number]  Chief Complaint  Patient presents with   Cough    Pt c/o cough, present for 4 days. Has tried mucinex and robitussin which did help slightly.   Discussed the use of AI scribe software for clinical note transcription with the patient, who gave verbal consent to proceed.  History of Present Illness Alyssa Dudley is a 68 year old female undergoing chemotherapy who presents with a persistent cough.  She has experienced a persistent cough since Saturday, which has worsened despite taking Mucinex, Claritin, and Tigan. The cough is severe, frequent, and causes nocturnal urinary incontinence. There is no shortness of breath. Using a spirometer for deep breathing sometimes triggers the cough. She resumed Lud[place] on Monday. She experiences night sweats and a slight fever. The cough, initially dry, is now productive with yellowish mucus. There is no history of asthma. For allergies, she takes Claritin during the day and Claritin D at night for sneezing and runny nose. She has nasal congestion but no ear pressure or facial pain. She has not taken prednisone  or other steroids.  Assessment & Plan Persistent Cough - Lungs clear. Persistent cough with yellow sputum, likely viral but bacterial infection considered if symptoms persist. No shortness of breath, wheezing, or fever. Night sweats reported. Advised to limit Claritin D due to potential hypertension. - Prescribe prednisone  for 5 days, morning dose with breakfast. - Advise against ibuprofen , naproxen, or Advil  while on prednisone ; acetaminophen  allowed. - Prescribe azithromycin  if symptoms persist by Friday or Saturday. - Encourage increased water intake to thin mucus. - Advise regular Claritin daily, limit Claritin D to one week only. - Recommend OTC generic fluticasone or triamcinolone  nasal spray once or twice daily for nasal congestion and all sinus sx. - Call  office back if sx persist after completion of meds.   Subjective:    Outpatient Medications Prior to Visit  Medication Sig Dispense Refill   acyclovir  (ZOVIRAX ) 400 MG tablet Take 1 tablet (400 mg total) by mouth 2 (two) times daily. 60 tablet 5   amLODipine  (NORVASC ) 5 MG tablet Take 1 tablet (5 mg total) by mouth daily. 30 tablet 2   aspirin  81 MG chewable tablet Chew 81 mg by mouth daily.     Calcium  Carb-Cholecalciferol (CALCIUM  + VITAMIN D3 PO) Take 1 tablet by mouth daily.     hydrochlorothiazide  (MICROZIDE ) 12.5 MG capsule Take 1 capsule (12.5 mg total) by mouth daily as needed. FOR ANKLE SWELLING -take for 2-3 days, then stop.     lenalidomide  (REVLIMID ) 10 MG capsule Take 1 capsule (10 mg total) by mouth daily. Take 1 capsule (10 mg total) by mouth daily for 21 days. And then 7 days off for a 28 day cycle.  [employer] Auth # [id number] expires 02/26/2024 21 capsule 0   Multiple Vitamin (MULTIVITAMIN) tablet Take 1 tablet by mouth daily.     triamcinolone  cream (KENALOG ) 0.1 % APPLY EXTERNALLY TO THE AFFECTED AREA THREE TIMES DAILY AS NEEDED FOR RASH 30 g 2   Vitamin D , Ergocalciferol , (DRISDOL ) 1.25 MG (50000 UNIT) CAPS capsule Take 1 capsule (50,000 Units total) by mouth every 7 (seven) days. 13 capsule 2   Facility-Administered Medications Prior to Visit  Medication Dose Route Frequency Provider Last Rate Last Admin   0.9 %  sodium chloride  infusion  500 mL Intravenous Once Pyrtle, Amber Bail, MD       Past Medical History:  Diagnosis  Date   Allergy    once a year    Arthritis    Cancer (HCC) 05/08/22   CHF (congestive heart failure) (HCC) 2014   Hyperlipidemia    borderline- no meds    Hypertension 03/2015   Pacemaker    Paroxysmal atrial fibrillation (HCC) 03/2014   detected on ppm interrogation   PONV (postoperative nausea and vomiting)    has vomitted in past   Salivary stone 04/05/2021   Second degree Mobitz II AV block    Vitamin D  deficiency    Past Surgical History:   Procedure Laterality Date   BREAST CYST EXCISION  1980   CESAREAN SECTION  09/1977   COLONOSCOPY     EXTRACORPOREAL SHOCK WAVE LITHOTRIPSY Right 12/28/2019   Procedure: RIGHT EXTRACORPOREAL SHOCK WAVE LITHOTRIPSY (ESWL);  Surgeon: Florencio Hunting, MD;  Location: [facility];  Service: Urology;  Laterality: Right;   PERMANENT PACEMAKER INSERTION N/A 01/13/2013   SJM Accent DR RF implanted by Dr Nunzio Belch for mobitz II AV block   PLANTAR FASCIECTOMY     POLYPECTOMY     PPM GENERATOR CHANGEOUT N/A 07/08/2023   Procedure: PPM GENERATOR CHANGEOUT;  Surgeon: Boyce Byes, MD;  Location: [facility] INVASIVE CV LAB;  Service: Cardiovascular;  Laterality: N/A;   WRIST SURGERY Right 2011   removed cyst   No Known Allergies    Objective:    Physical Exam Vitals and nursing note reviewed.  Constitutional:      Appearance: Normal appearance. She is not ill-appearing.     Interventions: Face mask in place.  HENT:     Right Ear: Tympanic membrane and ear canal normal.     Left Ear: Tympanic membrane and ear canal normal.     Nose: Congestion and rhinorrhea present.     Right Sinus: No frontal sinus tenderness.     Left Sinus: No frontal sinus tenderness.     Mouth/Throat:     Mouth: Mucous membranes are moist.     Pharynx: Posterior oropharyngeal erythema (mild) present. No pharyngeal swelling, oropharyngeal exudate or uvula swelling.     Tonsils: No tonsillar exudate or tonsillar abscesses.  Cardiovascular:     Rate and Rhythm: Normal rate and regular rhythm.  Pulmonary:     Effort: Pulmonary effort is normal.     Breath sounds: Normal breath sounds.  Musculoskeletal:        General: Normal range of motion.  Lymphadenopathy:     Head:     Right side of head: No preauricular or posterior auricular adenopathy.     Left side of head: No preauricular or posterior auricular adenopathy.     Cervical: No cervical adenopathy.  Skin:    General: Skin is warm and dry.   Neurological:     Mental Status: She is alert.  Psychiatric:        Mood and Affect: Mood normal.        Behavior: Behavior normal.    BP (!) 146/76 (BP Location: Left Arm, Patient Position: Sitting, Cuff Size: Large)   Pulse 72   Temp 98 F (36.7 C) (Temporal)   Ht 5\' 6"  (1.676 m)   Wt 185 lb 9.3 oz (84.2 kg)   SpO2 100%   BMI 29.95 kg/m  Wt Readings from Last 3 Encounters:  02/05/24 185 lb 9.3 oz (84.2 kg)  01/28/24 192 lb 14.4 oz (87.5 kg)  12/26/23 187 lb 6.4 oz (85 kg)      Versa Gore, NP

## 2024-02-10 ENCOUNTER — Ambulatory Visit: Payer: Medicare Other | Admitting: Emergency Medicine

## 2024-02-10 ENCOUNTER — Ambulatory Visit: Payer: BC Managed Care – PPO

## 2024-02-17 NOTE — Addendum Note (Signed)
 Addended by: Lott Rouleau A on: 02/17/2024 03:32 PM   Modules accepted: Orders

## 2024-02-17 NOTE — Progress Notes (Signed)
 Remote pacemaker transmission.

## 2024-02-24 ENCOUNTER — Ambulatory Visit: Payer: Medicare Other

## 2024-02-24 ENCOUNTER — Other Ambulatory Visit: Payer: Medicare Other

## 2024-02-25 ENCOUNTER — Inpatient Hospital Stay

## 2024-02-25 ENCOUNTER — Inpatient Hospital Stay (HOSPITAL_BASED_OUTPATIENT_CLINIC_OR_DEPARTMENT_OTHER): Admitting: Hematology

## 2024-02-25 ENCOUNTER — Encounter: Payer: Self-pay | Admitting: Hematology

## 2024-02-25 ENCOUNTER — Inpatient Hospital Stay: Attending: Hematology

## 2024-02-25 VITALS — BP 122/68 | HR 73 | Temp 98.5°F | Resp 18 | Ht 66.0 in | Wt 189.7 lb

## 2024-02-25 DIAGNOSIS — Z5112 Encounter for antineoplastic immunotherapy: Secondary | ICD-10-CM | POA: Insufficient documentation

## 2024-02-25 DIAGNOSIS — C9001 Multiple myeloma in remission: Secondary | ICD-10-CM

## 2024-02-25 DIAGNOSIS — Z79620 Long term (current) use of immunosuppressive biologic: Secondary | ICD-10-CM | POA: Diagnosis not present

## 2024-02-25 DIAGNOSIS — C9000 Multiple myeloma not having achieved remission: Secondary | ICD-10-CM

## 2024-02-25 LAB — COMPREHENSIVE METABOLIC PANEL WITH GFR
ALT: 18 U/L (ref 0–44)
AST: 16 U/L (ref 15–41)
Albumin: 4.1 g/dL (ref 3.5–5.0)
Alkaline Phosphatase: 112 U/L (ref 38–126)
Anion gap: 5 (ref 5–15)
BUN: 11 mg/dL (ref 8–23)
CO2: 27 mmol/L (ref 22–32)
Calcium: 8.7 mg/dL — ABNORMAL LOW (ref 8.9–10.3)
Chloride: 110 mmol/L (ref 98–111)
Creatinine, Ser: 0.74 mg/dL (ref 0.44–1.00)
GFR, Estimated: >60 mL/min (ref >60)
Glucose, Bld: 97 mg/dL (ref 70–99)
Potassium: 3.4 mmol/L — ABNORMAL LOW (ref 3.5–5.1)
Sodium: 142 mmol/L (ref 135–145)
Total Bilirubin: 0.5 mg/dL (ref 0.0–1.2)
Total Protein: 6.4 g/dL — ABNORMAL LOW (ref 6.5–8.1)

## 2024-02-25 LAB — CBC WITH DIFFERENTIAL (CANCER CENTER ONLY)
Abs Immature Granulocytes: 0.01 10*3/uL (ref 0.00–0.07)
Basophils Absolute: 0 10*3/uL (ref 0.0–0.1)
Basophils Relative: 1 %
Eosinophils Absolute: 0.2 10*3/uL (ref 0.0–0.5)
Eosinophils Relative: 3 %
HCT: 35.4 % — ABNORMAL LOW (ref 36.0–46.0)
Hemoglobin: 12.4 g/dL (ref 12.0–15.0)
Immature Granulocytes: 0 %
Lymphocytes Relative: 47 %
Lymphs Abs: 2 10*3/uL (ref 0.7–4.0)
MCH: 31.3 pg (ref 26.0–34.0)
MCHC: 35 g/dL (ref 30.0–36.0)
MCV: 89.4 fL (ref 80.0–100.0)
Monocytes Absolute: 0.7 10*3/uL (ref 0.1–1.0)
Monocytes Relative: 16 %
Neutro Abs: 1.5 10*3/uL — ABNORMAL LOW (ref 1.7–7.7)
Neutrophils Relative %: 33 %
Platelet Count: 194 10*3/uL (ref 150–400)
RBC: 3.96 MIL/uL (ref 3.87–5.11)
RDW: 15.2 % (ref 11.5–15.5)
WBC Count: 4.4 10*3/uL (ref 4.0–10.5)
nRBC: 0 % (ref 0.0–0.2)

## 2024-02-25 MED ORDER — LENALIDOMIDE 10 MG PO CAPS MILLENIUM C16014
10.0000 mg | ORAL_CAPSULE | Freq: Every day | ORAL | 0 refills | Status: DC
Start: 2024-02-25 — End: 2024-02-26

## 2024-02-25 MED ORDER — DARATUMUMAB-HYALURONIDASE-FIHJ 1800-30000 MG-UT/15ML ~~LOC~~ SOLN
1800.0000 mg | Freq: Once | SUBCUTANEOUS | Status: AC
  Administered 2024-02-25: 1800 mg via SUBCUTANEOUS
  Filled 2024-02-25: qty 15

## 2024-02-25 NOTE — Patient Instructions (Signed)

## 2024-02-25 NOTE — Assessment & Plan Note (Signed)
 IgG lambda type, stage II, high risk -Diagnosed with CRAB criteria and bone marrow bx 05/01/2022, pathology confirmed plasma cell myeloma (60% plasma cells). Cytogenics and FISH c/w standard risk multiple myeloma except the deletion of 1P and gain of 1 q. which is a higher risk feature.  Alyssa Dudley is being treated as high risk -S/p 5 cycles induction DaraVRd starting 05/15/22 followed by autologous stem cell transplant 10/12/22 by Dr. Rosaria Ferries. Post transplant PET and marrow showed no evidence of disease -Alyssa Dudley received one cycle of DaraVRd to complete a total of 6 cycles -will continue dara and revlimid maintenance, with daratumumab injection 1800 mg every month for 2 years and Revlimid 10 mg on days 1-21 every 28 days, Alyssa Dudley started on March 18, 2023. -Will check her multiple myeloma labs and f/u every 2 months, M protein negative and light chain levels normal on 10/2023, Alyssa Dudley remains in remission.  -Alyssa Dudley will also follow-up with her transplant team

## 2024-02-25 NOTE — Progress Notes (Signed)
 [facility]   [phone] [phone]   Clinic Follow up Note   Patient Care Team: Versa Gore, NP as PCP - General (Family Medicine) Boyce Byes, MD as PCP - Electrophysiology (Cardiology) Tessa Figures, MD as Consulting Physician (Gastroenterology) Guinevere Lefevre, OD as Referring Physician (Optometry) Elmyra Haggard, MD as Consulting Physician (Cardiology) Lyanne Sample, MD as Consulting Physician (Orthopedic Surgery) Ollie Bhat, MD as Referring Physician (Hematology and Oncology) Sonja Nome, MD as Attending Physician (Hematology and Oncology)  Date of Service:  02/25/2024  CHIEF COMPLAINT: f/u of MM  CURRENT THERAPY:  Maintenance Revlimid  10 mg daily, 3 weeks on and 1 week off Daratumumab  injection every 4 weeks until 05/2024  Oncology History   Multiple myeloma (HCC)  IgG lambda type, stage II, high risk -Diagnosed with CRAB criteria and bone marrow bx 05/01/2022, pathology confirmed plasma cell myeloma (60% plasma cells). Cytogenics and FISH c/w standard risk multiple myeloma except the deletion of 1P and gain of 1 q. which is a higher risk feature.  She is being treated as high risk -S/p 5 cycles induction DaraVRd starting 05/15/22 followed by autologous stem cell transplant 10/12/22 by Dr. Gaila Josephs. Post transplant PET and marrow showed no evidence of disease -she received one cycle of DaraVRd to complete a total of 6 cycles -will continue dara and revlimid  maintenance, with daratumumab  injection 1800 mg every month for 2 years and Revlimid  10 mg on days 1-21 every 28 days, she started on March 18, 2023. -Will check her multiple myeloma labs and f/u every 2 months, M protein negative and light chain levels normal on 10/2023, she remains in remission.  -She will also follow-up with her transplant team  Assessment & Plan Multiple myeloma in remission Multiple myeloma is in remission with normal immunoglobulin and light  chain levels, and negative M protein. Maintenance therapy with Revlimid  is ongoing, with potential dose reduction over time. Discussed that while multiple myeloma is difficult to cure, some patients remain disease-free for extended periods. Revlimid  carries risks of blood clots and secondary cancers, including breast cancer, necessitating regular screening mammograms. - Continue Revlimid  10 mg orally, three weeks on, one week off. - Encourage regular exercise to mitigate the risk of blood clots. - Ensure regular screening mammograms to monitor for secondary cancers. - Cancel June office visit; maintain July appointment for follow-up.  Hypertension Hypertension is well-controlled with no recent episodes of headaches or lightheadedness.  Goals of Care Discussed the prognosis of multiple myeloma and the potential for long-term remission. She understands that while a cure is unlikely, some patients remain disease-free for many years. She hopes to be among those with long-term remission and is committed to ongoing maintenance therapy.  Plan - Lab reviewed, adequate for treatment, will proceed to Dara injection today and continue every 4 weeks - Continue Revlimid  - Follow-up in 2 months   SUMMARY OF ONCOLOGIC HISTORY: Oncology History Overview Note   Cancer Staging  Multiple myeloma (HCC) Staging form: Plasma Cell Myeloma and Plasma Cell Disorders, AJCC 8th Edition - Clinical stage from 05/01/2022: RISS Stage II (Beta-2 -microglobulin (mg/L): 3.2, Albumin (g/dL): 3.6, ISS: Stage I, High-risk cytogenetics: Absent, LDH: Elevated) - Signed by Sonja [place], MD on 06/03/2022 Stage prefix: Initial diagnosis Beta 2 microglobulin range (mg/L): Less than 3.5 Albumin range (g/dL): Greater than or equal to 3.5 Cytogenetics: 1p deletion     Multiple myeloma (HCC)  05/01/2022 Cancer Staging   Staging form: Plasma Cell Myeloma and Plasma Cell Disorders,  AJCC 8th Edition - Clinical stage from 05/01/2022: RISS  Stage II (Beta-2 -microglobulin (mg/L): 3.2, Albumin (g/dL): 3.6, ISS: Stage I, High-risk cytogenetics: Absent, LDH: Elevated) - Signed by Sonja Golden, MD on 06/03/2022 Stage prefix: Initial diagnosis Beta 2 microglobulin range (mg/L): Less than 3.5 Albumin range (g/dL): Greater than or equal to 3.5 Cytogenetics: 1p deletion   05/08/2022 Initial Diagnosis   Multiple myeloma (HCC)   05/14/2022 - 02/28/2023 Chemotherapy   Patient is on Treatment Plan : MYELOMA NEWLY DIAGNOSED TRANSPLANT CANDIDATE DaraVRd (Daratumumab  IV) q21d x 6 Cycles (Induction/Consolidation)     05/15/2022 - 05/28/2022 Chemotherapy   Patient is on Treatment Plan : MYELOMA NEWLY DIAGNOSED TRANSPLANT CANDIDATE DaraVRd (Daratumumab  IV) q21d x 6 Cycles (Induction/Consolidation)     03/18/2023 -  Chemotherapy   Patient is on Treatment Plan : MYELOMA Daratumumab  SQ q28d        Discussed the use of AI scribe software for clinical note transcription with the patient, who gave verbal consent to proceed.  History of Present Illness Alyssa Dudley is a 68 year old female with multiple myeloma who presents for follow-up.  She is in remission and on maintenance therapy with Revlimid , 10 mg orally for three weeks on and one week off, with no issues reported. Recent blood work shows normal blood counts, immunoglobulin levels, M protein, and light chain levels. Kidney and liver function tests are pending. She experiences prolonged recovery from colds but maintains good energy levels and appetite. Her weight is stable at 189 pounds with slight fluctuations.     All other systems were reviewed with the patient and are negative.  MEDICAL HISTORY:  Past Medical History:  Diagnosis Date   Allergy    once a year    Arthritis    Cancer (HCC) 05/08/22   CHF (congestive heart failure) (HCC) 2014   Hyperlipidemia    borderline- no meds    Hypertension 03/2015   Pacemaker    Paroxysmal atrial fibrillation (HCC) 03/2014   detected on ppm  interrogation   PONV (postoperative nausea and vomiting)    has vomitted in past   Salivary stone 04/05/2021   Second degree Mobitz II AV block    Vitamin D  deficiency     SURGICAL HISTORY: Past Surgical History:  Procedure Laterality Date   BREAST CYST EXCISION  1980   CESAREAN SECTION  09/1977   COLONOSCOPY     EXTRACORPOREAL SHOCK WAVE LITHOTRIPSY Right 12/28/2019   Procedure: RIGHT EXTRACORPOREAL SHOCK WAVE LITHOTRIPSY (ESWL);  Surgeon: Florencio Hunting, MD;  Location: [facility];  Service: Urology;  Laterality: Right;   PERMANENT PACEMAKER INSERTION N/A 01/13/2013   SJM Accent DR RF implanted by Dr Nunzio Belch for mobitz II AV block   PLANTAR FASCIECTOMY     POLYPECTOMY     PPM GENERATOR CHANGEOUT N/A 07/08/2023   Procedure: PPM GENERATOR CHANGEOUT;  Surgeon: Boyce Byes, MD;  Location[facility] INVASIVE CV LAB;  Service: Cardiovascular;  Laterality: N/A;   WRIST SURGERY Right 2011   removed cyst    I have reviewed the social history and family history with the patient and they are unchanged from previous note.  ALLERGIES:  has no known allergies.  MEDICATIONS:  Current Outpatient Medications  Medication Sig Dispense Refill   acyclovir  (ZOVIRAX ) 400 MG tablet Take 1 tablet (400 mg total) by mouth 2 (two) times daily. 60 tablet 5   amLODipine  (NORVASC ) 5 MG tablet Take 1 tablet (5 mg total) by mouth daily. 30 tablet 2  aspirin  81 MG chewable tablet Chew 81 mg by mouth daily.     Calcium  Carb-Cholecalciferol (CALCIUM  + VITAMIN D3 PO) Take 1 tablet by mouth daily.     Multiple Vitamin (MULTIVITAMIN) tablet Take 1 tablet by mouth daily.     triamcinolone  cream (KENALOG ) 0.1 % APPLY EXTERNALLY TO THE AFFECTED AREA THREE TIMES DAILY AS NEEDED FOR RASH 30 g 2   Vitamin D , Ergocalciferol , (DRISDOL ) 1.25 MG (50000 UNIT) CAPS capsule Take 1 capsule (50,000 Units total) by mouth every 7 (seven) days. 13 capsule 2   hydrochlorothiazide  (MICROZIDE ) 12.5 MG capsule Take 1  capsule (12.5 mg total) by mouth daily as needed. FOR ANKLE SWELLING -take for 2-3 days, then stop.     lenalidomide  (REVLIMID ) 10 MG capsule Take 1 capsule (10 mg total) by mouth daily. Take 1 capsule (10 mg total) by mouth daily for 21 days. And then 7 days off for a 28 day cycle.  [employer] Auth # [id number] expires 02/26/2024 21 capsule 0   predniSONE  (DELTASONE ) 20 MG tablet Take 2 pills in the morning with breakfast for 3 days, then 1 pill for 2 days 8 tablet 0   Current Facility-Administered Medications  Medication Dose Route Frequency Provider Last Rate Last Admin   0.9 %  sodium chloride  infusion  500 mL Intravenous Once Pyrtle, Amber Bail, MD        PHYSICAL EXAMINATION: ECOG PERFORMANCE STATUS: 0 - Asymptomatic  Vitals:   02/25/24 1348 02/25/24 1349  BP: (!) 142/64 122/68  Pulse: 73   Resp: 18   Temp: 98.5 F (36.9 C)   SpO2: 99%    Wt Readings from Last 3 Encounters:  02/25/24 189 lb 11.2 oz (86 kg)  02/05/24 185 lb 9.3 oz (84.2 kg)  01/28/24 192 lb 14.4 oz (87.5 kg)     GENERAL:alert, no distress and comfortable SKIN: skin color, texture, turgor are normal, no rashes or significant lesions EYES: normal, Conjunctiva are pink and non-injected, sclera clear NECK: supple, thyroid  normal size, non-tender, without nodularity LYMPH:  no palpable lymphadenopathy in the cervical, axillary  LUNGS: clear to auscultation and percussion with normal breathing effort HEART: regular rate & rhythm and no murmurs and no lower extremity edema ABDOMEN:abdomen soft, non-tender and normal bowel sounds Musculoskeletal:no cyanosis of digits and no clubbing  NEURO: alert & oriented x 3 with fluent speech, no focal motor/sensory deficits  Physical Exam   LABORATORY DATA:  I have reviewed the data as listed    Latest Ref Rng & Units 02/25/2024    1:27 PM 01/28/2024    9:27 AM 12/23/2023   10:23 AM  CBC  WBC 4.0 - 10.5 K/uL 4.4  5.3  3.6   Hemoglobin 12.0 - 15.0 g/dL 62.1  30.8  65.7    Hematocrit 36.0 - 46.0 % 35.4  37.2  38.4   Platelets 150 - 400 K/uL 194  176  181         Latest Ref Rng & Units 02/25/2024    1:27 PM 01/28/2024    9:27 AM 12/23/2023   10:23 AM  CMP  Glucose 70 - 99 mg/dL 97  97  846   BUN 8 - 23 mg/dL 11  9  12    Creatinine 0.44 - 1.00 mg/dL 9.62  9.52  8.41   Sodium 135 - 145 mmol/L 142  142  143   Potassium 3.5 - 5.1 mmol/L 3.4  3.7  3.4   Chloride 98 - 111 mmol/L 110  107  104   CO2 22 - 32 mmol/L 27  31  32   Calcium  8.9 - 10.3 mg/dL 8.7  9.3  9.4   Total Protein 6.5 - 8.1 g/dL 6.4  6.6  6.8   Total Bilirubin 0.0 - 1.2 mg/dL 0.5  0.8  0.5   Alkaline Phos 38 - 126 U/L 112  129  116   AST 15 - 41 U/L 16  18  20    ALT 0 - 44 U/L 18  25  22        RADIOGRAPHIC STUDIES: I have personally reviewed the radiological images as listed and agreed with the findings in the report. No results found.    No orders of the defined types were placed in this encounter.  All questions were answered. The patient knows to call the clinic with any problems, questions or concerns. No barriers to learning was detected. The total time spent in the appointment was 25 minutes, including review of chart and various tests results, discussions about plan of care and coordination of care plan     Sonja Angelina, MD 02/25/2024

## 2024-02-26 ENCOUNTER — Other Ambulatory Visit: Payer: Self-pay | Admitting: Hematology

## 2024-02-26 ENCOUNTER — Other Ambulatory Visit: Payer: Self-pay

## 2024-02-26 DIAGNOSIS — C9001 Multiple myeloma in remission: Secondary | ICD-10-CM

## 2024-02-26 LAB — KAPPA/LAMBDA LIGHT CHAINS
Kappa free light chain: 6.7 mg/L (ref 3.3–19.4)
Kappa, lambda light chain ratio: 0.92 (ref 0.26–1.65)
Lambda free light chains: 7.3 mg/L (ref 5.7–26.3)

## 2024-02-26 MED ORDER — LENALIDOMIDE 10 MG PO CAPS MILLENIUM C16014: 10.0000 mg | ORAL_CAPSULE | Freq: Every day | ORAL | 0 refills | Status: DC

## 2024-02-27 LAB — PROTEIN ELECTROPHORESIS, SERUM
A/G Ratio: 1.3 (ref 0.7–1.7)
Albumin ELP: 3.3 g/dL (ref 2.9–4.4)
Alpha-1-Globulin: 0.2 g/dL (ref 0.0–0.4)
Alpha-2-Globulin: 0.8 g/dL (ref 0.4–1.0)
Beta Globulin: 0.9 g/dL (ref 0.7–1.3)
Gamma Globulin: 0.5 g/dL (ref 0.4–1.8)
Globulin, Total: 2.5 g/dL (ref 2.2–3.9)
Total Protein ELP: 5.8 g/dL — ABNORMAL LOW (ref 6.0–8.5)

## 2024-02-28 ENCOUNTER — Other Ambulatory Visit: Payer: Self-pay | Admitting: Hematology

## 2024-02-28 DIAGNOSIS — C9001 Multiple myeloma in remission: Secondary | ICD-10-CM

## 2024-03-03 ENCOUNTER — Other Ambulatory Visit: Payer: Self-pay | Admitting: Hematology

## 2024-03-03 ENCOUNTER — Other Ambulatory Visit: Payer: Self-pay

## 2024-03-03 DIAGNOSIS — C9001 Multiple myeloma in remission: Secondary | ICD-10-CM

## 2024-03-03 MED ORDER — LENALIDOMIDE 10 MG PO CAPS MILLENIUM C16014: 10.0000 mg | ORAL_CAPSULE | Freq: Every day | ORAL | 0 refills | Status: DC

## 2024-03-05 LAB — IFE, DARA-SPECIFIC, SERUM
IgA: 17 mg/dL — ABNORMAL LOW (ref 87–352)
IgG (Immunoglobin G), Serum: 641 mg/dL (ref 586–1602)
IgM (Immunoglobulin M), Srm: 21 mg/dL — ABNORMAL LOW (ref 26–217)

## 2024-03-19 ENCOUNTER — Other Ambulatory Visit: Payer: Self-pay

## 2024-03-19 ENCOUNTER — Other Ambulatory Visit: Payer: Self-pay | Admitting: Family

## 2024-03-19 DIAGNOSIS — I10 Essential (primary) hypertension: Secondary | ICD-10-CM

## 2024-03-19 MED ORDER — AMLODIPINE BESYLATE 5 MG PO TABS: 5.0000 mg | ORAL_TABLET | Freq: Every day | ORAL | 2 refills | Status: DC

## 2024-03-24 ENCOUNTER — Inpatient Hospital Stay: Attending: Hematology

## 2024-03-24 ENCOUNTER — Inpatient Hospital Stay

## 2024-03-24 ENCOUNTER — Encounter: Payer: Self-pay | Admitting: Hematology

## 2024-03-24 ENCOUNTER — Ambulatory Visit: Admitting: Nurse Practitioner

## 2024-03-24 VITALS — BP 129/66 | HR 60 | Temp 98.5°F | Resp 16 | Wt 187.8 lb

## 2024-03-24 DIAGNOSIS — Z79620 Long term (current) use of immunosuppressive biologic: Secondary | ICD-10-CM | POA: Diagnosis not present

## 2024-03-24 DIAGNOSIS — Z5112 Encounter for antineoplastic immunotherapy: Secondary | ICD-10-CM | POA: Insufficient documentation

## 2024-03-24 DIAGNOSIS — C9000 Multiple myeloma not having achieved remission: Secondary | ICD-10-CM

## 2024-03-24 DIAGNOSIS — C9001 Multiple myeloma in remission: Secondary | ICD-10-CM | POA: Diagnosis not present

## 2024-03-24 LAB — CBC WITH DIFFERENTIAL (CANCER CENTER ONLY)
Abs Immature Granulocytes: 0 10*3/uL (ref 0.00–0.07)
Basophils Absolute: 0 10*3/uL (ref 0.0–0.1)
Basophils Relative: 1 %
Eosinophils Absolute: 0.2 10*3/uL (ref 0.0–0.5)
Eosinophils Relative: 6 %
HCT: 37.5 % (ref 36.0–46.0)
Hemoglobin: 12.8 g/dL (ref 12.0–15.0)
Immature Granulocytes: 0 %
Lymphocytes Relative: 49 %
Lymphs Abs: 1.4 10*3/uL (ref 0.7–4.0)
MCH: 31.3 pg (ref 26.0–34.0)
MCHC: 34.1 g/dL (ref 30.0–36.0)
MCV: 91.7 fL (ref 80.0–100.0)
Monocytes Absolute: 0.4 10*3/uL (ref 0.1–1.0)
Monocytes Relative: 13 %
Neutro Abs: 0.9 10*3/uL — ABNORMAL LOW (ref 1.7–7.7)
Neutrophils Relative %: 31 %
Platelet Count: 183 10*3/uL (ref 150–400)
RBC: 4.09 MIL/uL (ref 3.87–5.11)
RDW: 15 % (ref 11.5–15.5)
WBC Count: 2.8 10*3/uL — ABNORMAL LOW (ref 4.0–10.5)
nRBC: 0 % (ref 0.0–0.2)

## 2024-03-24 LAB — COMPREHENSIVE METABOLIC PANEL WITH GFR
ALT: 17 U/L (ref 0–44)
AST: 17 U/L (ref 15–41)
Albumin: 4.2 g/dL (ref 3.5–5.0)
Alkaline Phosphatase: 100 U/L (ref 38–126)
Anion gap: 7 (ref 5–15)
BUN: 11 mg/dL (ref 8–23)
CO2: 29 mmol/L (ref 22–32)
Calcium: 9.3 mg/dL (ref 8.9–10.3)
Chloride: 109 mmol/L (ref 98–111)
Creatinine, Ser: 0.79 mg/dL (ref 0.44–1.00)
GFR, Estimated: >60 mL/min (ref >60)
Glucose, Bld: 103 mg/dL — ABNORMAL HIGH (ref 70–99)
Potassium: 4.1 mmol/L (ref 3.5–5.1)
Sodium: 145 mmol/L (ref 135–145)
Total Bilirubin: 0.7 mg/dL (ref 0.0–1.2)
Total Protein: 6.4 g/dL — ABNORMAL LOW (ref 6.5–8.1)

## 2024-03-24 MED ORDER — DARATUMUMAB-HYALURONIDASE-FIHJ 1800-30000 MG-UT/15ML ~~LOC~~ SOLN
1800.0000 mg | Freq: Once | SUBCUTANEOUS | Status: AC
  Administered 2024-03-24: 1800 mg via SUBCUTANEOUS
  Filled 2024-03-24: qty 15

## 2024-03-24 NOTE — Progress Notes (Signed)
 Patient took her steroid at home, at 7:30AM.

## 2024-03-25 ENCOUNTER — Other Ambulatory Visit: Payer: Self-pay | Admitting: Hematology

## 2024-03-25 ENCOUNTER — Other Ambulatory Visit: Payer: Self-pay

## 2024-03-25 DIAGNOSIS — C9001 Multiple myeloma in remission: Secondary | ICD-10-CM

## 2024-03-25 DIAGNOSIS — I10 Essential (primary) hypertension: Secondary | ICD-10-CM

## 2024-03-25 MED ORDER — LENALIDOMIDE 10 MG PO CAPS MILLENIUM C16014: 10.0000 mg | ORAL_CAPSULE | Freq: Every day | ORAL | 0 refills | Status: DC

## 2024-03-25 MED ORDER — LENALIDOMIDE 10 MG PO CAPS: 10.0000 mg | ORAL_CAPSULE | Freq: Every day | ORAL | 0 refills | Status: DC

## 2024-03-25 NOTE — Telephone Encounter (Signed)
 call pharmacy and let them know to discontinue med- pt is no longer taking this, thx

## 2024-04-09 ENCOUNTER — Ambulatory Visit (INDEPENDENT_AMBULATORY_CARE_PROVIDER_SITE_OTHER): Payer: Medicare Other

## 2024-04-09 DIAGNOSIS — I441 Atrioventricular block, second degree: Secondary | ICD-10-CM

## 2024-04-13 LAB — CUP PACEART REMOTE DEVICE CHECK
Battery Remaining Longevity: 101 mo
Battery Remaining Percentage: 95 %
Battery Voltage: 2.99 V
Brady Statistic AP VP Percent: 10 %
Brady Statistic AP VS Percent: 1 %
Brady Statistic AS VP Percent: 90 %
Brady Statistic AS VS Percent: 1 %
Brady Statistic RA Percent Paced: 10 %
Brady Statistic RV Percent Paced: 99 %
Date Time Interrogation Session: 20250706202937
Implantable Lead Connection Status: 753985
Implantable Lead Connection Status: 753985
Implantable Lead Implant Date: 20140408
Implantable Lead Implant Date: 20140408
Implantable Lead Location: 753859
Implantable Lead Location: 753860
Implantable Lead Model: 1944
Implantable Lead Model: 1948
Implantable Pulse Generator Implant Date: 20240930
Lead Channel Impedance Value: 540 Ohm
Lead Channel Impedance Value: 590 Ohm
Lead Channel Pacing Threshold Amplitude: 0.5 V
Lead Channel Pacing Threshold Amplitude: 0.75 V
Lead Channel Pacing Threshold Pulse Width: 0.5 ms
Lead Channel Pacing Threshold Pulse Width: 0.5 ms
Lead Channel Sensing Intrinsic Amplitude: 3.6 mV
Lead Channel Sensing Intrinsic Amplitude: 9.1 mV
Lead Channel Setting Pacing Amplitude: 2.5 V
Lead Channel Setting Pacing Amplitude: 2.5 V
Lead Channel Setting Pacing Pulse Width: 0.5 ms
Lead Channel Setting Sensing Sensitivity: 4 mV
Pulse Gen Model: 2272
Pulse Gen Serial Number: 8210888

## 2024-04-13 NOTE — Progress Notes (Unsigned)
 Phone [phone]  Subjective:   Patient is a 68 y.o. female presenting for annual physical.    Chief Complaint  Patient presents with   Annual Exam    not fasting  Discussed the use of AI scribe software for clinical note transcription with the patient, who gave verbal consent to proceed.  History of Present Illness Jaquisha Tiedeman is a 68 year old female who presents for an annual physical exam.  Cancer surveillance and remission - In remission from multiple myeloma - Attends monthly follow-ups at the cancer center  Hypertension management - Takes amlodipine  5 mg daily, sending refill  Postmenopausal genitourinary symptoms - Vaginal dryness and dyspareunia, described as a tearing sensation - Symptoms likely related to postmenopausal changes and possibly cancer medication - Has tried oil and Vaseline for relief - Plans to try KY jelly  Preventive health maintenance - Last colonoscopy in 2022 - Mammogram earlier this year - Bone density scan in 2022 - No recent abnormal Pap smears  Bowel and bladder function - Regular bowel movements, sometimes twice daily - No urinary issues  Lifestyle and diet - Does not consume alcohol - Never smoked, except for trying once at age 52 - Walks two to three times a week - Diet includes occasional eggs, generally skips breakfast, prefers lunch - Manages cholesterol with diet   See problem oriented charting- ROS- full  review of systems was completed and negative except for: what is noted in HPI above.  The following were reviewed and entered/updated in epic: Past Medical History:  Diagnosis Date   Allergy    once a year    Arthritis    Cancer (HCC) 05/08/22   CHF (congestive heart failure) (HCC) 2014   Hyperlipidemia    borderline- no meds    Hypertension 03/2015   Pacemaker    Paroxysmal atrial fibrillation (HCC) 03/2014   detected on ppm interrogation   PONV (postoperative nausea and vomiting)    has vomitted in past    Salivary stone 04/05/2021   Second degree Mobitz II AV block    Vitamin D  deficiency    Patient Active Problem List   Diagnosis Date Noted   Chronic pruritic rash in adult 12/26/2023   Multiple myeloma (HCC) 05/08/2022   History of renal calculi 05/17/2021   Osteoarthritis of multiple joints 04/05/2021   Meralgia paraesthetica, left 02/13/2018   Plantar fasciitis of left foot 02/13/2018   HTN (hypertension) 08/17/2014   Borderline type 2 diabetes mellitus 08/17/2014   Atrial fibrillation (HCC) 06/07/2014   Hyperlipidemia    Vitamin D  deficiency    Mobitz type 2 second degree and high-grade heart block 01/07/2013   Family history of malignant neoplasm of gastrointestinal tract 10/15/2011   Colon polyps 10/15/2011   Past Surgical History:  Procedure Laterality Date   BREAST CYST EXCISION  1980   CESAREAN SECTION  09/1977   COLONOSCOPY     EXTRACORPOREAL SHOCK WAVE LITHOTRIPSY Right 12/28/2019   Procedure: RIGHT EXTRACORPOREAL SHOCK WAVE LITHOTRIPSY (ESWL);  Surgeon: Renda Glance, MD;  Location: [facility];  Service: Urology;  Laterality: Right;   PERMANENT PACEMAKER INSERTION N/A 01/13/2013   SJM Accent DR RF implanted by Dr Kelsie for mobitz II AV block   PLANTAR FASCIECTOMY     POLYPECTOMY     PPM GENERATOR CHANGEOUT N/A 07/08/2023   Procedure: PPM GENERATOR CHANGEOUT;  Surgeon: Cindie Ole DASEN, MD;  Location: [facility] INVASIVE CV LAB;  Service: Cardiovascular;  Laterality: N/A;   WRIST SURGERY Right  2011   removed cyst    Family History  Problem Relation Age of Onset   Cervical cancer Mother    Hypertension Mother    Diabetes Mother    Arthritis Mother    Cancer Mother    Heart disease Father    Hyperlipidemia Father    Colon cancer Sister 29   Anemia Sister    Cancer Sister    Diabetes Sister    Hypertension Sister    Miscarriages / Stillbirths Sister    Leukemia Sister    Cancer Sister    Diabetes Sister    Hypertension Sister    Prostate  cancer Brother 75   Cancer Brother    HIV/AIDS Brother    Multiple myeloma Son 24   Hypertension Son    Kidney disease Son    ADD / ADHD Son    Cancer Son    Multiple myeloma Maternal Aunt    Alcohol abuse Brother    Colon polyps Neg Hx    Rectal cancer Neg Hx    Stomach cancer Neg Hx    Liver disease Neg Hx    Esophageal cancer Neg Hx     Medications- reviewed and updated Current Outpatient Medications  Medication Sig Dispense Refill   acyclovir  (ZOVIRAX ) 400 MG tablet Take 1 tablet (400 mg total) by mouth 2 (two) times daily. 60 tablet 5   aspirin  81 MG chewable tablet Chew 81 mg by mouth daily.     Calcium  Carb-Cholecalciferol (CALCIUM  + VITAMIN D3 PO) Take 1 tablet by mouth daily.     Estradiol  10 MCG INST Place 10 mcg vaginally as directed. Insert nightly for 1 week, then every other night for 1 week, then 3 nights per week ongoing. Contact office for refill. 20 each 0   lenalidomide  (REVLIMID ) 10 MG capsule Take 1 capsule (10 mg total) by mouth daily. Take 1 capsule (10 mg total) by mouth daily for 21 days. And then 7 days off for a 28 day cycle. [employer] Auth # [id number] expires 04/16/2024 21 capsule 0   Multiple Vitamin (MULTIVITAMIN) tablet Take 1 tablet by mouth daily.     triamcinolone  cream (KENALOG ) 0.1 % APPLY EXTERNALLY TO THE AFFECTED AREA THREE TIMES DAILY AS NEEDED FOR RASH 30 g 2   Vitamin D , Ergocalciferol , (DRISDOL ) 1.25 MG (50000 UNIT) CAPS capsule Take 1 capsule (50,000 Units total) by mouth every 7 (seven) days. 13 capsule 2   amLODipine  (NORVASC ) 5 MG tablet Take 1 tablet (5 mg total) by mouth daily. 90 tablet 1   Current Facility-Administered Medications  Medication Dose Route Frequency Provider Last Rate Last Admin   0.9 %  sodium chloride  infusion  500 mL Intravenous Once Pyrtle, Gordy HERO, MD        Allergies-reviewed and updated No Known Allergies  Social History   Social History Narrative   Not on file    Objective:  BP 134/82   Pulse 68   Temp  97.9 F (36.6 C) (Temporal)   Ht 5' 5 (1.651 m)   Wt 187 lb 3.2 oz (84.9 kg)   SpO2 98%   BMI 31.15 kg/m  Physical Exam Vitals and nursing note reviewed.  Constitutional:      Appearance: Normal appearance.  HENT:     Head: Normocephalic.     Right Ear: Tympanic membrane normal.     Left Ear: Tympanic membrane normal.     Nose: Nose normal.     Mouth/Throat:     Mouth:  Mucous membranes are moist.  Eyes:     Pupils: Pupils are equal, round, and reactive to light.  Cardiovascular:     Rate and Rhythm: Normal rate and regular rhythm.  Pulmonary:     Effort: Pulmonary effort is normal.     Breath sounds: Normal breath sounds.  Musculoskeletal:        General: Normal range of motion.     Cervical back: Normal range of motion.  Lymphadenopathy:     Cervical: No cervical adenopathy.  Skin:    General: Skin is warm and dry.  Neurological:     Mental Status: She is alert.  Psychiatric:        Mood and Affect: Mood normal.        Behavior: Behavior normal.     Assessment and Plan   Health Maintenance counseling: 1. Anticipatory guidance: Patient counseled regarding regular dental exams q6 months, eye exams,  avoiding smoking and second hand smoke, limiting alcohol to 1 beverage per day, no illicit drugs.   2. Risk factor reduction:  Advised patient of need for regular exercise and diet rich with fruits and vegetables to reduce risk of heart attack and stroke. Exercise- walking several times per day.  Wt Readings from Last 3 Encounters:  04/17/24 187 lb 3.2 oz (84.9 kg)  04/15/24 187 lb (84.8 kg)  03/24/24 187 lb 12 oz (85.2 kg)   3. Immunizations/screenings/ancillary studies Immunization History  Administered Date(s) Administered   DTaP / IPV 04/16/2023, 10/15/2023, 01/27/2024   HIB (PRP-T) 04/16/2023, 10/15/2023, 01/27/2024   Hepb-cpg 04/16/2023, 10/15/2023   Influenza Inj Mdck Quad With Preservative 07/26/2017, 08/11/2018, 07/13/2019, 07/25/2020   Influenza Split  06/30/2013, 07/20/2014, 07/21/2015   Influenza, High Dose Seasonal PF 07/05/2022   Influenza,inj,Quad PF,6+ Mos 08/04/2021   Influenza,inj,quad, With Preservative 07/05/2016   Influenza,trivalent, recombinat, inj, PF 06/26/2023   Influenza-Unspecified 07/24/2022   Moderna Covid-19 Fall Seasonal Vaccine 659yrs & older 01/22/2023   PFIZER(Purple Top)SARS-COV-2 Vaccination 12/05/2019, 12/26/2019, 07/23/2020   PNEUMOCOCCAL CONJUGATE-20 04/16/2023, 10/15/2023, 01/27/2024   PPD Test 02/22/2014, 05/10/2015   Pfizer Covid-19 Vaccine Bivalent Booster 55yrs & up 06/21/2022   Tdap 10/09/2011   Zoster Recombinant(Shingrix) 01/09/2017, 02/06/2018, 02/06/2018   There are no preventive care reminders to display for this patient.   4. Cervical cancer screening- due, last one in 2018 5. Breast cancer screening-  mammogram done this year 6. Colon cancer screening - last done 2022, due again 2027 7. Skin cancer screening- advised regular sunscreen use. Denies worrisome, changing, or new skin lesions.  8. Birth control/STD check- N/A 9. Osteoporosis screening- last done 2022 all normal 10. Alcohol screening: none 11. Smoking associated screening (lung cancer screening, AAA screen 65-75, UA)- never smoked  Assessment & Plan Postmenopausal Vaginal Atrophy Dyspareunia and vaginal tearing due to atrophy. Prefers local estrogen therapy due to cancer history. - Recommend KY lubricant. - Prescribe Estrace  vaginal pill nightly for one week, then a few nights weekly. - Provide educational materials on postmenopausal changes. - F/U in 3 mos  Hypertension Well-controlled with amlodipine  5 mg daily concerned re mobitz 2 HB, controlled with pacemaker. No ankle edema. Hx of taking atenolol  50mg  qd, but taken off med list, will review cardiology notes.  - Prescribe 90-day supply of amlodipine  with refills. - F/U in 6mos  Hyperlipidemia Managed with dietary modifications. Discussed dietary management and  importance of regular lipid monitoring. - Order cholesterol panel today. - F/U 22m-59yr  Annual Physical Examination Routine examination. No acute issues. Cancer remission with  regular monitoring. Labs stable.  Up to date with screenings. Normal bone density. Regular physical activity. Discussed increased exercise. - Order thyroid  function test, CMP, CBC, vitamin D  level. - Order cholesterol panel. - Encourage increased physical activity and consider gym membership - Schedule follow-up in 6 months for blood pressure check. - Schedule Pap smear with GYN if desired   Recommended follow up:  No follow-ups on file. Future Appointments  Date Time Provider Department Center  04/21/2024 12:30 PM [facility] LAB [facility] None  04/21/2024  1:00 PM Lanny Callander, MD [facility] None  04/21/2024  2:00 PM [facility] INFUSION [facility] None  07/09/2024  7:00 AM [facility] DEVICE REMOTES [facility]  10/09/2024  7:00 AM [facility] DEVICE REMOTES [facility] H&V  01/08/2025  7:00 AM [facility] DEVICE REMOTES [facility] H&V  04/08/2025  7:00 AM [facility] DEVICE REMOTES [facility] H&V  04/22/2025  1:00 PM [facility] ANNUAL WELLNESS VISIT 1 [facility] PEC  07/08/2025  7:00 AM [facility] DEVICE REMOTES [facility]V    Lab/Order associations: non-fasting - 3 eggs   Lucius Krabbe, NP

## 2024-04-15 ENCOUNTER — Ambulatory Visit

## 2024-04-15 VITALS — Ht 65.0 in | Wt 187.0 lb

## 2024-04-15 DIAGNOSIS — Z0000 Encounter for general adult medical examination without abnormal findings: Secondary | ICD-10-CM

## 2024-04-15 NOTE — Patient Instructions (Signed)
 Alyssa Dudley , Thank you for taking time out of your busy schedule to complete your Annual Wellness Visit with me. I enjoyed our conversation and look forward to speaking with you again next year. I, as well as your care team,  appreciate your ongoing commitment to your health goals. Please review the following plan we discussed and let me know if I can assist you in the future. Your Game plan/ To Do List    Referrals: If you haven't heard from the office you've been referred to, please reach out to them at the phone provided.   Follow up Visits: Next Medicare AWV with our clinical staff: 04/22/25   Have you seen your provider in the last 6 months (3 months if uncontrolled diabetes)? Yes Next Office Visit with your provider: 04/17/24  Clinician Recommendations:  Aim for 30 minutes of exercise or brisk walking, 6-8 glasses of water, and 5 servings of fruits and vegetables each day.       This is a list of the screening recommended for you and due dates:  Health Maintenance  Topic Date Due   COVID-19 Vaccine (6 - 2024-25 season) 06/09/2023   Hepatitis C Screening  12/25/2024*   Flu Shot  05/08/2024   Mammogram  12/12/2024   Medicare Annual Wellness Visit  04/15/2025   Colon Cancer Screening  07/28/2026   DTaP/Tdap/Td vaccine (4 - Td or Tdap) 10/14/2033   Pneumococcal Vaccine for age over 63  Completed   Hepatitis B Vaccine  Completed   DEXA scan (bone density measurement)  Completed   Zoster (Shingles) Vaccine  Completed   HPV Vaccine  Aged Out   Meningitis B Vaccine  Aged Out  *Topic was postponed. The date shown is not the original due date.    Advanced directives: (Copy Requested) Please bring a copy of your health care power of attorney and living will to the office to be added to your chart at your convenience. You can mail to [facility] [address] [place], [place] [address] or email to [email or web address] Advance Care Planning is important because  it:  [x]  Makes sure you receive the medical care that is consistent with your values, goals, and preferences  [x]  It provides guidance to your family and loved ones and reduces their decisional burden about whether or not they are making the right decisions based on your wishes.  Follow the link provided in your after visit summary or read over the paperwork we have mailed to you to help you started getting your Advance Directives in place. If you need assistance in completing these, please reach out to us  so that we can help you!  See attachments for Preventive Care and Fall Prevention Tips.

## 2024-04-15 NOTE — Progress Notes (Signed)
 Subjective:   Alyssa Dudley is a 68 y.o. who presents for a Medicare Wellness preventive visit.  As a reminder, Annual Wellness Visits don't include a physical exam, and some assessments may be limited, especially if this visit is performed virtually. We may recommend an in-person follow-up visit with your provider if needed.  Visit Complete: Virtual I connected with  Chyrl Wilms on 04/15/24 by a audio enabled telemedicine application and verified that I am speaking with the correct person using two identifiers.  Patient Location: Home  Provider Location: Home Office  I discussed the limitations of evaluation and management by telemedicine. The patient expressed understanding and agreed to proceed.  Vital Signs: Because this visit was a virtual/telehealth visit, some criteria may be missing or patient reported. Any vitals not documented were not able to be obtained and vitals that have been documented are patient reported.  VideoDeclined- This patient declined Librarian, academic. Therefore the visit was completed with audio only.  Persons Participating in Visit: Patient.  AWV Questionnaire: Yes: Patient Medicare AWV questionnaire was completed by the patient on 04/14/24; I have confirmed that all information answered by patient is correct and no changes since this date.  Cardiac Risk Factors include: advanced age (>53men, >14 women)     Objective:    Today's Vitals   04/15/24 1301  Weight: 187 lb (84.8 kg)  Height: 5' 5 (1.651 m)   Body mass index is 31.12 kg/m.     04/15/2024    1:05 PM 07/08/2023    1:21 PM 02/21/2023    9:37 AM 02/14/2023    8:30 AM 07/31/2022    9:16 AM 07/09/2022    9:17 AM 05/22/2022   10:00 AM  Advanced Directives  Does Patient Have a Medical Advance Directive? Yes Yes Yes No Yes Yes Yes  Type of Estate agent of [place];Living will Healthcare Power of [place];Living will   Healthcare Power of  [place];Living will  Healthcare Power of [place];Living will  Does patient want to make changes to medical advance directive?   No - Patient declined No - Patient declined No - Patient declined No - Patient declined No - Patient declined  Copy of Healthcare Power of Attorney in Chart? No - copy requested      No - copy requested  Would patient like information on creating a medical advance directive?       No - Patient declined    Current Medications (verified) Outpatient Encounter Medications as of 04/15/2024  Medication Sig   acyclovir  (ZOVIRAX ) 400 MG tablet Take 1 tablet (400 mg total) by mouth 2 (two) times daily.   amLODipine  (NORVASC ) 5 MG tablet TAKE 1 TABLET(5 MG) BY MOUTH DAILY   aspirin  81 MG chewable tablet Chew 81 mg by mouth daily.   Calcium  Carb-Cholecalciferol (CALCIUM  + VITAMIN D3 PO) Take 1 tablet by mouth daily.   lenalidomide  (REVLIMID ) 10 MG capsule Take 1 capsule (10 mg total) by mouth daily. Take 1 capsule (10 mg total) by mouth daily for 21 days. And then 7 days off for a 28 day cycle.  [employer] Auth # [id number] expires 03/25/2024   Multiple Vitamin (MULTIVITAMIN) tablet Take 1 tablet by mouth daily.   triamcinolone  cream (KENALOG ) 0.1 % APPLY EXTERNALLY TO THE AFFECTED AREA THREE TIMES DAILY AS NEEDED FOR RASH   Vitamin D , Ergocalciferol , (DRISDOL ) 1.25 MG (50000 UNIT) CAPS capsule Take 1 capsule (50,000 Units total) by mouth every 7 (seven) days.   Facility-Administered Encounter  Medications as of 04/15/2024  Medication   0.9 %  sodium chloride  infusion    Allergies (verified) Patient has no known allergies.   History: Past Medical History:  Diagnosis Date   Allergy    once a year    Arthritis    Cancer (HCC) 05/08/22   CHF (congestive heart failure) (HCC) 2014   Hyperlipidemia    borderline- no meds    Hypertension 03/2015   Pacemaker    Paroxysmal atrial fibrillation (HCC) 03/2014   detected on ppm interrogation   PONV (postoperative nausea and vomiting)     has vomitted in past   Salivary stone 04/05/2021   Second degree Mobitz II AV block    Vitamin D  deficiency    Past Surgical History:  Procedure Laterality Date   BREAST CYST EXCISION  1980   CESAREAN SECTION  09/1977   COLONOSCOPY     EXTRACORPOREAL SHOCK WAVE LITHOTRIPSY Right 12/28/2019   Procedure: RIGHT EXTRACORPOREAL SHOCK WAVE LITHOTRIPSY (ESWL);  Surgeon: Renda Glance, MD;  Location[facility];  Service: Urology;  Laterality: Right;   PERMANENT PACEMAKER INSERTION N/A 01/13/2013   SJM Accent DR RF implanted by Dr Kelsie for mobitz II AV block   PLANTAR FASCIECTOMY     POLYPECTOMY     PPM GENERATOR CHANGEOUT N/A 07/08/2023   Procedure: PPM GENERATOR CHANGEOUT;  Surgeon: Cindie Ole DASEN, MD;  Location: [facility] INVASIVE CV LAB;  Service: Cardiovascular;  Laterality: N/A;   WRIST SURGERY Right 2011   removed cyst   Family History  Problem Relation Age of Onset   Cervical cancer Mother    Hypertension Mother    Diabetes Mother    Arthritis Mother    Cancer Mother    Heart disease Father    Hyperlipidemia Father    Colon cancer Sister 49   Anemia Sister    Cancer Sister    Diabetes Sister    Hypertension Sister    Miscarriages / Stillbirths Sister    Leukemia Sister    Cancer Sister    Diabetes Sister    Hypertension Sister    Prostate cancer Brother 45   Cancer Brother    HIV/AIDS Brother    Multiple myeloma Son 29   Hypertension Son    Kidney disease Son    ADD / ADHD Son    Cancer Son    Multiple myeloma Maternal Aunt    Alcohol abuse Brother    Colon polyps Neg Hx    Rectal cancer Neg Hx    Stomach cancer Neg Hx    Liver disease Neg Hx    Esophageal cancer Neg Hx    Social History   Socioeconomic History   Marital status: Single    Spouse name: Not on file   Number of children: 1   Years of education: Not on file   Highest education level: Bachelor's degree (e.g., BA, AB, BS)  Occupational History   Not on file  Tobacco Use    Smoking status: Never   Smokeless tobacco: Never  Vaping Use   Vaping status: Never Used  Substance and Sexual Activity   Alcohol use: No   Drug use: No   Sexual activity: Not Currently  Other Topics Concern   Not on file  Social History Narrative   Not on file   Social Drivers of Health   Financial Resource Strain: Low Risk  (04/15/2024)   Overall Financial Resource Strain (CARDIA)    Difficulty of Paying Living Expenses:  Not hard at all  Food Insecurity: No Food Insecurity (04/15/2024)   Hunger Vital Sign    Worried About Running Out of Food in the Last Year: Never true    Ran Out of Food in the Last Year: Never true  Transportation Needs: No Transportation Needs (04/15/2024)   PRAPARE - Administrator, Civil Service (Medical): No    Lack of Transportation (Non-Medical): No  Physical Activity: Insufficiently Active (04/15/2024)   Exercise Vital Sign    Days of Exercise per Week: 2 days    Minutes of Exercise per Session: 20 min  Stress: No Stress Concern Present (04/15/2024)   [employer] of Occupational Health - Occupational Stress Questionnaire    Feeling of Stress: Only a little  Social Connections: Moderately Integrated (04/15/2024)   Social Connection and Isolation Panel    Frequency of Communication with Friends and Family: More than three times a week    Frequency of Social Gatherings with Friends and Family: More than three times a week    Attends Religious Services: More than 4 times per year    Active Member of [employer] or Organizations: Yes    Attends Banker Meetings: 1 to 4 times per year    Marital Status: Divorced    Tobacco Counseling Counseling given: Not Answered    Clinical Intake:  Pre-visit preparation completed: Yes  Pain : No/denies pain     BMI - recorded: 31.12 Nutritional Status: BMI > 30  Obese Diabetes: No  Lab Results  Component Value Date   HGBA1C 5.8 (H) 04/17/2023   HGBA1C 5.6 04/05/2022   HGBA1C 5.4  04/05/2021     How often do you need to have someone help you when you read instructions, pamphlets, or other written materials from your doctor or pharmacy?: 1 - Never  Interpreter Needed?: No  Information entered by :: Ellouise Haws, LPN   Activities of Daily Living     04/14/2024    1:45 PM  In your present state of health, do you have any difficulty performing the following activities:  Hearing? 0  Vision? 0  Difficulty concentrating or making decisions? 0  Walking or climbing stairs? 0  Dressing or bathing? 0  Doing errands, shopping? 0  Preparing Food and eating ? N  Using the Toilet? N  In the past six months, have you accidently leaked urine? N  Do you have problems with loss of bowel control? N  Managing your Medications? N  Managing your Finances? N  Housekeeping or managing your Housekeeping? N    Patient Care Team: Lucius Krabbe, NP as PCP - General (Family Medicine) Cindie Ole DASEN, MD as PCP - Electrophysiology (Cardiology) Jakie Alm SAUNDERS, MD as Consulting Physician (Gastroenterology) Lita Lye, OD as Referring Physician (Optometry) Okey Vina GAILS, MD as Consulting Physician (Cardiology) Murrell Kuba, MD as Consulting Physician (Orthopedic Surgery) Arman Dorn Loving, MD as Referring Physician (Hematology and Oncology) Lanny Callander, MD as Attending Physician (Hematology and Oncology)  I have updated your Care Teams any recent Medical Services you may have received from other providers in the past year.     Assessment:   This is a routine wellness examination for Alyssa Dudley.  Hearing/Vision screen Hearing Screening - Comments:: Pt denies any hearing issues  Vision Screening - Comments:: Wears rx glasses - up to date with routine eye exams with  Dr Glendia   Goals Addressed             This Visit's  Progress    Patient Stated       Get off blood pressure medications        Depression Screen     04/15/2024    1:03 PM 12/26/2023     1:09 PM 03/04/2018   10:33 AM 07/27/2017    6:26 PM 07/26/2017   11:41 AM  PHQ 2/9 Scores  PHQ - 2 Score 0 0 0 0 0    Fall Risk     04/14/2024    1:45 PM 12/26/2023    1:09 PM 07/27/2017    6:26 PM 07/26/2017   11:41 AM  Fall Risk   Falls in the past year? 0 0 No  No   Number falls in past yr: 0 0    Injury with Fall? 0 0    Risk for fall due to : No Fall Risks No Fall Risks    Follow up Falls prevention discussed Falls evaluation completed;Education provided       Data saved with a previous flowsheet row definition    MEDICARE RISK AT HOME:  Medicare Risk at Home Any stairs in or around the home?: (Patient-Rptd) Yes If so, are there any without handrails?: (Patient-Rptd) No Home free of loose throw rugs in walkways, pet beds, electrical cords, etc?: (Patient-Rptd) Yes Adequate lighting in your home to reduce risk of falls?: (Patient-Rptd) Yes Life alert?: (Patient-Rptd) No Use of a cane, walker or w/c?: (Patient-Rptd) No Grab bars in the bathroom?: (Patient-Rptd) No Shower chair or bench in shower?: (Patient-Rptd) No Elevated toilet seat or a handicapped toilet?: (Patient-Rptd) No  TIMED UP AND GO:  Was the test performed?  No  Cognitive Function: 6CIT completed        04/15/2024    1:11 PM  6CIT Screen  What Year? 0 points  What month? 0 points  What time? 0 points  Count back from 20 0 points  Months in reverse 0 points  Repeat phrase 0 points  Total Score 0 points    Immunizations Immunization History  Administered Date(s) Administered   DTaP / IPV 04/16/2023, 10/15/2023, 01/27/2024   HIB (PRP-T) 04/16/2023, 10/15/2023, 01/27/2024   Hepb-cpg 04/16/2023, 10/15/2023   Influenza Inj Mdck Quad With Preservative 07/26/2017, 08/11/2018, 07/13/2019, 07/25/2020   Influenza Split 06/30/2013, 07/20/2014, 07/21/2015   Influenza, High Dose Seasonal PF 07/05/2022   Influenza,inj,Quad PF,6+ Mos 08/04/2021   Influenza,inj,quad, With Preservative 07/05/2016    Influenza,trivalent, recombinat, inj, PF 06/26/2023   Influenza-Unspecified 07/24/2022   Moderna Covid-19 Fall Seasonal Vaccine 73yrs & older 01/22/2023   PFIZER(Purple Top)SARS-COV-2 Vaccination 12/05/2019, 12/26/2019, 07/23/2020   PNEUMOCOCCAL CONJUGATE-20 04/16/2023, 10/15/2023, 01/27/2024   PPD Test 02/22/2014, 05/10/2015   Pfizer Covid-19 Vaccine Bivalent Booster 71yrs & up 06/21/2022   Tdap 10/09/2011   Zoster Recombinant(Shingrix) 01/09/2017, 02/06/2018, 02/06/2018    Screening Tests Health Maintenance  Topic Date Due   COVID-19 Vaccine (6 - 2024-25 season) 06/09/2023   Hepatitis C Screening  12/25/2024 (Originally 05/19/1974)   INFLUENZA VACCINE  05/08/2024   MAMMOGRAM  12/12/2024   Medicare Annual Wellness (AWV)  04/15/2025   Colonoscopy  07/28/2026   DTaP/Tdap/Td (5 - Td or Tdap) 01/26/2034   Pneumococcal Vaccine: 50+ Years  Completed   Hepatitis B Vaccines  Completed   DEXA SCAN  Completed   Zoster Vaccines- Shingrix  Completed   HPV VACCINES  Aged Out   Meningococcal B Vaccine  Aged Out    Health Maintenance  Health Maintenance Due  Topic Date Due   COVID-19 Vaccine (  6 - 2024-25 season) 06/09/2023   Health Maintenance Items Addressed: See Nurse Notes at the end of this note  Additional Screening:  Vision Screening: Recommended annual ophthalmology exams for early detection of glaucoma and other disorders of the eye. Would you like a referral to an eye doctor? No    Dental Screening: Recommended annual dental exams for proper oral hygiene  Community Resource Referral / Chronic Care Management: CRR required this visit?  No   CCM required this visit?  No   Plan:    I have personally reviewed and noted the following in the patient's chart:   Medical and social history Use of alcohol, tobacco or illicit drugs  Current medications and supplements including opioid prescriptions. Patient is not currently taking opioid prescriptions. Functional ability and  status Nutritional status Physical activity Advanced directives List of other physicians Hospitalizations, surgeries, and ER visits in previous 12 months Vitals Screenings to include cognitive, depression, and falls Referrals and appointments  In addition, I have reviewed and discussed with patient certain preventive protocols, quality metrics, and best practice recommendations. A written personalized care plan for preventive services as well as general preventive health recommendations were provided to patient.   Ellouise VEAR Haws, LPN   2/0/7974   After Visit Summary: (MyChart) Due to this being a telephonic visit, the after visit summary with patients personalized plan was offered to patient via MyChart   Notes: Nothing significant to report at this time.

## 2024-04-16 ENCOUNTER — Other Ambulatory Visit: Payer: Self-pay

## 2024-04-16 ENCOUNTER — Encounter: Payer: BLUE CROSS/BLUE SHIELD | Admitting: Nurse Practitioner

## 2024-04-16 DIAGNOSIS — C9001 Multiple myeloma in remission: Secondary | ICD-10-CM

## 2024-04-16 MED ORDER — LENALIDOMIDE 10 MG PO CAPS: 10.0000 mg | ORAL_CAPSULE | Freq: Every day | ORAL | 0 refills | Status: DC

## 2024-04-17 ENCOUNTER — Ambulatory Visit (INDEPENDENT_AMBULATORY_CARE_PROVIDER_SITE_OTHER): Admitting: Family

## 2024-04-17 ENCOUNTER — Other Ambulatory Visit: Payer: Self-pay | Admitting: Family

## 2024-04-17 VITALS — BP 134/82 | HR 68 | Temp 97.9°F | Ht 65.0 in | Wt 187.2 lb

## 2024-04-17 DIAGNOSIS — N941 Unspecified dyspareunia: Secondary | ICD-10-CM

## 2024-04-17 DIAGNOSIS — E7849 Other hyperlipidemia: Secondary | ICD-10-CM | POA: Diagnosis not present

## 2024-04-17 DIAGNOSIS — E559 Vitamin D deficiency, unspecified: Secondary | ICD-10-CM

## 2024-04-17 DIAGNOSIS — Z0000 Encounter for general adult medical examination without abnormal findings: Secondary | ICD-10-CM

## 2024-04-17 DIAGNOSIS — R7303 Prediabetes: Secondary | ICD-10-CM | POA: Diagnosis not present

## 2024-04-17 DIAGNOSIS — I10 Essential (primary) hypertension: Secondary | ICD-10-CM

## 2024-04-17 DIAGNOSIS — Z950 Presence of cardiac pacemaker: Secondary | ICD-10-CM

## 2024-04-17 LAB — TSH: TSH: 2.01 u[IU]/mL (ref 0.35–5.50)

## 2024-04-17 LAB — LIPID PANEL
Cholesterol: 182 mg/dL (ref 0–200)
HDL: 54.3 mg/dL (ref >39.00)
LDL Cholesterol: 112 mg/dL — ABNORMAL HIGH (ref 0–99)
NonHDL: 127.27
Total CHOL/HDL Ratio: 3
Triglycerides: 76 mg/dL (ref 0.0–149.0)
VLDL: 15.2 mg/dL (ref 0.0–40.0)

## 2024-04-17 LAB — VITAMIN D 25 HYDROXY (VIT D DEFICIENCY, FRACTURES): VITD: 31.8 ng/mL (ref 30.00–100.00)

## 2024-04-17 MED ORDER — AMLODIPINE BESYLATE 5 MG PO TABS: 5.0000 mg | ORAL_TABLET | Freq: Every day | ORAL | 1 refills | Status: AC

## 2024-04-17 MED ORDER — ESTRADIOL 10 MCG VA INST: 10.0000 ug | VAGINAL_INSERT | VAGINAL | 0 refills | Status: DC

## 2024-04-17 NOTE — Patient Instructions (Addendum)
 It was very nice to see you today!   I will review your lab results via MyChart in a few days.  I have sent in a vaginal medication to start using to help with your dryness and pain.  Use as directed on the package. I also attached more information about your condition and be sure your husband reads :-)  Please schedule a 6 month follow up visit today.       PLEASE NOTE:  If you had any lab tests please let us  know if you have not heard back within a few days. You may see your results on MyChart before we have a chance to review them but we will give you a call once they are reviewed by us . If we ordered any referrals today, please let us  know if you have not heard from their office within the next week.

## 2024-04-18 ENCOUNTER — Ambulatory Visit: Payer: Self-pay | Admitting: Cardiology

## 2024-04-20 DIAGNOSIS — Z950 Presence of cardiac pacemaker: Secondary | ICD-10-CM | POA: Insufficient documentation

## 2024-04-20 DIAGNOSIS — N941 Unspecified dyspareunia: Secondary | ICD-10-CM | POA: Insufficient documentation

## 2024-04-20 NOTE — Assessment & Plan Note (Signed)
 Managed with dietary modifications. Discussed dietary management and importance of regular lipid monitoring. - Order cholesterol panel today. - F/U 45m-43yr

## 2024-04-20 NOTE — Assessment & Plan Note (Signed)
 Mobitz type 2 - 2nd degree w/high grade HB- replaced 06/2023 ASA & Atenolol 

## 2024-04-20 NOTE — Assessment & Plan Note (Signed)
 Well-controlled with amlodipine  5 mg daily. No ankle edema. - Prescribe 90-day supply of amlodipine  with refills. - F/U in 6mos

## 2024-04-20 NOTE — Assessment & Plan Note (Signed)
-   Recommend [place] lubricant. - Prescribe Estrace  vaginal pill nightly for one week, then a few nights weekly. - Provide educational materials on postmenopausal changes. - F/U in 3 mos

## 2024-04-21 ENCOUNTER — Inpatient Hospital Stay (HOSPITAL_BASED_OUTPATIENT_CLINIC_OR_DEPARTMENT_OTHER): Admitting: Hematology

## 2024-04-21 ENCOUNTER — Inpatient Hospital Stay: Attending: Hematology

## 2024-04-21 ENCOUNTER — Ambulatory Visit: Payer: Self-pay | Admitting: Family

## 2024-04-21 ENCOUNTER — Inpatient Hospital Stay

## 2024-04-21 VITALS — BP 132/64 | HR 72 | Temp 98.0°F | Resp 16 | Ht 65.0 in | Wt 190.3 lb

## 2024-04-21 DIAGNOSIS — C9000 Multiple myeloma not having achieved remission: Secondary | ICD-10-CM

## 2024-04-21 DIAGNOSIS — E2839 Other primary ovarian failure: Secondary | ICD-10-CM | POA: Diagnosis not present

## 2024-04-21 DIAGNOSIS — Z79620 Long term (current) use of immunosuppressive biologic: Secondary | ICD-10-CM | POA: Diagnosis not present

## 2024-04-21 DIAGNOSIS — C9001 Multiple myeloma in remission: Secondary | ICD-10-CM | POA: Insufficient documentation

## 2024-04-21 DIAGNOSIS — Z5112 Encounter for antineoplastic immunotherapy: Secondary | ICD-10-CM | POA: Diagnosis present

## 2024-04-21 LAB — COMPREHENSIVE METABOLIC PANEL WITH GFR
ALT: 21 U/L (ref 0–44)
AST: 18 U/L (ref 15–41)
Albumin: 4 g/dL (ref 3.5–5.0)
Alkaline Phosphatase: 106 U/L (ref 38–126)
Anion gap: 4 — ABNORMAL LOW (ref 5–15)
BUN: 10 mg/dL (ref 8–23)
CO2: 33 mmol/L — ABNORMAL HIGH (ref 22–32)
Calcium: 9.4 mg/dL (ref 8.9–10.3)
Chloride: 107 mmol/L (ref 98–111)
Creatinine, Ser: 0.73 mg/dL (ref 0.44–1.00)
GFR, Estimated: >60 mL/min (ref >60)
Glucose, Bld: 87 mg/dL (ref 70–99)
Potassium: 3.7 mmol/L (ref 3.5–5.1)
Sodium: 144 mmol/L (ref 135–145)
Total Bilirubin: 0.6 mg/dL (ref 0.0–1.2)
Total Protein: 6.4 g/dL — ABNORMAL LOW (ref 6.5–8.1)

## 2024-04-21 LAB — CBC WITH DIFFERENTIAL (CANCER CENTER ONLY)
Abs Immature Granulocytes: 0.01 K/uL (ref 0.00–0.07)
Basophils Absolute: 0 K/uL (ref 0.0–0.1)
Basophils Relative: 1 %
Eosinophils Absolute: 0.2 K/uL (ref 0.0–0.5)
Eosinophils Relative: 6 %
HCT: 34.8 % — ABNORMAL LOW (ref 36.0–46.0)
Hemoglobin: 12 g/dL (ref 12.0–15.0)
Immature Granulocytes: 0 %
Lymphocytes Relative: 49 %
Lymphs Abs: 1.5 K/uL (ref 0.7–4.0)
MCH: 31.4 pg (ref 26.0–34.0)
MCHC: 34.5 g/dL (ref 30.0–36.0)
MCV: 91.1 fL (ref 80.0–100.0)
Monocytes Absolute: 0.5 K/uL (ref 0.1–1.0)
Monocytes Relative: 16 %
Neutro Abs: 0.8 K/uL — ABNORMAL LOW (ref 1.7–7.7)
Neutrophils Relative %: 28 %
Platelet Count: 186 K/uL (ref 150–400)
RBC: 3.82 MIL/uL — ABNORMAL LOW (ref 3.87–5.11)
RDW: 15.5 % (ref 11.5–15.5)
WBC Count: 3 K/uL — ABNORMAL LOW (ref 4.0–10.5)
nRBC: 0 % (ref 0.0–0.2)

## 2024-04-21 MED ORDER — DARATUMUMAB-HYALURONIDASE-FIHJ 1800-30000 MG-UT/15ML ~~LOC~~ SOLN
1800.0000 mg | Freq: Once | SUBCUTANEOUS | Status: AC
  Administered 2024-04-21: 1800 mg via SUBCUTANEOUS
  Filled 2024-04-21: qty 15

## 2024-04-21 NOTE — Progress Notes (Signed)
 [facility]   [phone] [phone]   Clinic Follow up Note   Patient Care Team: Lucius Krabbe, NP as PCP - General (Family Medicine) Cindie Ole DASEN, MD as PCP - Electrophysiology (Cardiology) Jakie Alm SAUNDERS, MD as Consulting Physician (Gastroenterology) Lita Lye, OD as Referring Physician (Optometry) Okey Vina GAILS, MD as Consulting Physician (Cardiology) Murrell Kuba, MD as Consulting Physician (Orthopedic Surgery) Arman Dorn Loving, MD as Referring Physician (Hematology and Oncology) Lanny Callander, MD as Attending Physician (Hematology and Oncology)  Date of Service:  04/21/2024  CHIEF COMPLAINT: f/u of multiple myeloma  CURRENT THERAPY:  Maintenance durvalumab and Revlimid   Oncology History   Multiple myeloma (HCC)  IgG lambda type, stage II, high risk -Diagnosed with CRAB criteria and bone marrow bx 05/01/2022, pathology confirmed plasma cell myeloma (60% plasma cells). Cytogenics and FISH c/w standard risk multiple myeloma except the deletion of 1P and gain of 1 q. which is a higher risk feature.  She is being treated as high risk -S/p 5 cycles induction DaraVRd starting 05/15/22 followed by autologous stem cell transplant 10/12/22 by Dr. Arman. Post transplant PET and marrow showed no evidence of disease -she received one cycle of DaraVRd to complete a total of 6 cycles -will continue dara and revlimid  maintenance, with daratumumab  injection 1800 mg every month for 2 years and Revlimid  10 mg on days 1-21 every 28 days, she started on March 18, 2023. -Will check her multiple myeloma labs and f/u every 2 months, M protein negative and light chain levels normal on 10/2023, she remains in remission.  -She will also follow-up with her transplant team  Assessment & Plan Multiple myeloma in remission Multiple myeloma is in remission. She has been on daratumumab  since August 2023 and is nearing the two-year mark for maintenance  therapy, concluding at the end of 2025. Her regimen includes daratumumab  injections. Her last multiple myeloma panel in May was normal, with all light chains normal and SPAP negative. She takes calcium  and vitamin D  supplements for bone health. A bone density scan is due, as the last was in December 2022. Zemaira infusions are planned to strengthen bones due to fracture risk, despite remission. Zemaira carries risks of jaw necrosis, especially with periodontal disease, and may cause fatigue, bone aches, and flu-like symptoms. - Continue daratumumab  injections until the end of 2025. - Order bone density scan. - Start Zemaira infusions every three months for two years. - Ensure regular dental check-ups to monitor for potential jaw necrosis associated with Zemaira. - Monitor for side effects of Zemaira, including jaw necrosis, fatigue, bone aches, and flu-like symptoms.  Peripheral neuropathy Intermittent numbness in right hand fingers, resolving with movement, possibly a residual effect from previous multiple myeloma treatments. Current treatment is not known to cause neuropathy. - Encourage regular exercise and movement to manage neuropathy symptoms.  Plan - She is clinically doing very well, asymptomatic - Lab reviewed, her multiple myeloma labs shows that she is due in remission - Continue oral Revlimid , and durvalumab injection every months until the end of December 2025 - I recommend Zometa infusion every 3 months for 2 years due to his multiple myeloma.  Will get a dental clearance, plan to start in a month   SUMMARY OF ONCOLOGIC HISTORY: Oncology History Overview Note   Cancer Staging  Multiple myeloma (HCC) Staging form: Plasma Cell Myeloma and Plasma Cell Disorders, AJCC 8th Edition - Clinical stage from 05/01/2022: RISS Stage II (Beta-2 -microglobulin (mg/L): 3.2, Albumin (g/dL): 3.6, ISS:  Stage I, High-risk cytogenetics: Absent, LDH: Elevated) - Signed by Lanny Callander, MD on  06/03/2022 Stage prefix: Initial diagnosis Beta 2 microglobulin range (mg/L): Less than 3.5 Albumin range (g/dL): Greater than or equal to 3.5 Cytogenetics: 1p deletion     Multiple myeloma (HCC)  05/01/2022 Cancer Staging   Staging form: Plasma Cell Myeloma and Plasma Cell Disorders, AJCC 8th Edition - Clinical stage from 05/01/2022: RISS Stage II (Beta-2 -microglobulin (mg/L): 3.2, Albumin (g/dL): 3.6, ISS: Stage I, High-risk cytogenetics: Absent, LDH: Elevated) - Signed by Lanny Callander, MD on 06/03/2022 Stage prefix: Initial diagnosis Beta 2 microglobulin range (mg/L): Less than 3.5 Albumin range (g/dL): Greater than or equal to 3.5 Cytogenetics: 1p deletion   05/08/2022 Initial Diagnosis   Multiple myeloma (HCC)   05/14/2022 - 02/28/2023 Chemotherapy   Patient is on Treatment Plan : MYELOMA NEWLY DIAGNOSED TRANSPLANT CANDIDATE DaraVRd (Daratumumab  IV) q21d x 6 Cycles (Induction/Consolidation)     05/15/2022 - 05/28/2022 Chemotherapy   Patient is on Treatment Plan : MYELOMA NEWLY DIAGNOSED TRANSPLANT CANDIDATE DaraVRd (Daratumumab  IV) q21d x 6 Cycles (Induction/Consolidation)     03/18/2023 -  Chemotherapy   Patient is on Treatment Plan : MYELOMA Daratumumab  SQ q28d        Discussed the use of AI scribe software for clinical note transcription with the patient, who gave verbal consent to proceed.  History of Present Illness Alyssa Dudley is a 68 year old female with multiple myeloma who presents for follow-up.  She has been on daratumumab  since August 2023 and underwent a stem cell transplant in January 2024. Her last multiple myeloma panel in May showed normal light chains and negative SPAP. She experiences intermittent numbness in her right fingers, which she manages by repositioning them. There is no numbness in her feet. She takes vitamin D  once a week, although her previous doctor recommended twice a week. She also takes over-the-counter calcium  and vitamin supplements. Her appetite is  good, and her weight is stable.     All other systems were reviewed with the patient and are negative.  MEDICAL HISTORY:  Past Medical History:  Diagnosis Date   Allergy    once a year    Arthritis    Cancer (HCC) 05/08/22   CHF (congestive heart failure) (HCC) 2014   Hyperlipidemia    borderline- no meds    Hypertension 03/2015   Pacemaker    Paroxysmal atrial fibrillation (HCC) 03/2014   detected on ppm interrogation   PONV (postoperative nausea and vomiting)    has vomitted in past   Salivary stone 04/05/2021   Second degree Mobitz II AV block    Vitamin D  deficiency     SURGICAL HISTORY: Past Surgical History:  Procedure Laterality Date   BREAST CYST EXCISION  1980   CESAREAN SECTION  09/1977   COLONOSCOPY     EXTRACORPOREAL SHOCK WAVE LITHOTRIPSY Right 12/28/2019   Procedure: RIGHT EXTRACORPOREAL SHOCK WAVE LITHOTRIPSY (ESWL);  Surgeon: Renda Glance, MD;  Location: [facility];  Service: Urology;  Laterality: Right;   PERMANENT PACEMAKER INSERTION N/A 01/13/2013   SJM [employer] DR RF implanted by Dr Kelsie for mobitz II AV block   PLANTAR FASCIECTOMY     POLYPECTOMY     PPM GENERATOR CHANGEOUT N/A 07/08/2023   Procedure: PPM GENERATOR CHANGEOUT;  Surgeon: Cindie Ole DASEN, MD;  Location: [facility] INVASIVE CV LAB;  Service: Cardiovascular;  Laterality: N/A;   WRIST SURGERY Right 2011   removed cyst    I have reviewed the  social history and family history with the patient and they are unchanged from previous note.  ALLERGIES:  has no known allergies.  MEDICATIONS:  Current Outpatient Medications  Medication Sig Dispense Refill   acyclovir  (ZOVIRAX ) 400 MG tablet Take 1 tablet (400 mg total) by mouth 2 (two) times daily. (Patient not taking: Reported on 04/21/2024) 60 tablet 5   amLODipine  (NORVASC ) 5 MG tablet Take 1 tablet (5 mg total) by mouth daily. 90 tablet 1   aspirin  81 MG chewable tablet Chew 81 mg by mouth daily.     atenolol  (TENORMIN ) 50 MG  tablet Take 50 mg by mouth at bedtime.     Calcium  Carb-Cholecalciferol (CALCIUM  + VITAMIN D3 PO) Take 1 tablet by mouth daily.     Estradiol  10 MCG INST Place 10 mcg vaginally as directed. Insert nightly for 1 week, then every other night for 1 week, then 3 nights per week ongoing. Contact office for refill. 20 each 0   lenalidomide  (REVLIMID ) 10 MG capsule Take 1 capsule (10 mg total) by mouth daily. Take 1 capsule (10 mg total) by mouth daily for 21 days. And then 7 days off for a 28 day cycle. [employer] Auth # [id number] expires 04/16/2024 21 capsule 0   Multiple Vitamin (MULTIVITAMIN) tablet Take 1 tablet by mouth daily.     triamcinolone  cream (KENALOG ) 0.1 % APPLY EXTERNALLY TO THE AFFECTED AREA THREE TIMES DAILY AS NEEDED FOR RASH 30 g 2   Vitamin D , Ergocalciferol , (DRISDOL ) 1.25 MG (50000 UNIT) CAPS capsule Take 1 capsule (50,000 Units total) by mouth every 7 (seven) days. 13 capsule 2   Current Facility-Administered Medications  Medication Dose Route Frequency Provider Last Rate Last Admin   0.9 %  sodium chloride  infusion  500 mL Intravenous Once Pyrtle, Gordy HERO, MD        PHYSICAL EXAMINATION: ECOG PERFORMANCE STATUS: 0 - Asymptomatic  Vitals:   04/21/24 1257  BP: 132/64  Pulse: 72  Resp: 16  Temp: 98 F (36.7 C)  SpO2: 97%   Wt Readings from Last 3 Encounters:  04/21/24 190 lb 4.8 oz (86.3 kg)  04/17/24 187 lb 3.2 oz (84.9 kg)  04/15/24 187 lb (84.8 kg)     GENERAL:alert, no distress and comfortable SKIN: skin color, texture, turgor are normal, no rashes or significant lesions EYES: normal, Conjunctiva are pink and non-injected, sclera clear NECK: supple, thyroid  normal size, non-tender, without nodularity LYMPH:  no palpable lymphadenopathy in the cervical, axillary  LUNGS: clear to auscultation and percussion with normal breathing effort HEART: regular rate & rhythm and no murmurs and no lower extremity edema ABDOMEN:abdomen soft, non-tender and normal bowel  sounds Musculoskeletal:no cyanosis of digits and no clubbing  NEURO: alert & oriented x 3 with fluent speech, no focal motor/sensory deficits  Physical Exam    LABORATORY DATA:  I have reviewed the data as listed    Latest Ref Rng & Units 04/21/2024   12:38 PM 03/24/2024    9:21 AM 02/25/2024    1:27 PM  CBC  WBC 4.0 - 10.5 K/uL 3.0  2.8  4.4   Hemoglobin 12.0 - 15.0 g/dL 87.9  87.1  87.5   Hematocrit 36.0 - 46.0 % 34.8  37.5  35.4   Platelets 150 - 400 K/uL 186  183  194         Latest Ref Rng & Units 04/21/2024   12:38 PM 03/24/2024    9:21 AM 02/25/2024    1:27 PM  CMP  Glucose 70 - 99 mg/dL 87  896  97   BUN 8 - 23 mg/dL 10  11  11    Creatinine 0.44 - 1.00 mg/dL 9.26  9.20  9.25   Sodium 135 - 145 mmol/L 144  145  142   Potassium 3.5 - 5.1 mmol/L 3.7  4.1  3.4   Chloride 98 - 111 mmol/L 107  109  110   CO2 22 - 32 mmol/L 33  29  27   Calcium  8.9 - 10.3 mg/dL 9.4  9.3  8.7   Total Protein 6.5 - 8.1 g/dL 6.4  6.4  6.4   Total Bilirubin 0.0 - 1.2 mg/dL 0.6  0.7  0.5   Alkaline Phos 38 - 126 U/L 106  100  112   AST 15 - 41 U/L 18  17  16    ALT 0 - 44 U/L 21  17  18        RADIOGRAPHIC STUDIES: I have personally reviewed the radiological images as listed and agreed with the findings in the report. No results found.    Orders Placed This Encounter  Procedures   DG Bone Density    Standing Status:   Future    Expected Date:   05/22/2024    Expiration Date:   04/21/2025    Scheduling Instructions:     SOLIS    Reason for Exam (SYMPTOM  OR DIAGNOSIS REQUIRED):   SCREENING    Preferred imaging location?:   External   Kappa/lambda light chains    Standing Status:   Standing    Number of Occurrences:   20    Expiration Date:   04/21/2025   CBC with Differential (Cancer Center Only)    Standing Status:   Future    Expected Date:   05/26/2024    Expiration Date:   05/26/2025   CBC with Differential (Cancer Center Only)    Standing Status:   Future    Expected Date:    06/23/2024    Expiration Date:   06/23/2025   CBC with Differential (Cancer Center Only)    Standing Status:   Future    Expected Date:   07/21/2024    Expiration Date:   07/21/2025   CBC with Differential (Cancer Center Only)    Standing Status:   Future    Expected Date:   08/18/2024    Expiration Date:   08/18/2025   CBC with Differential (Cancer Center Only)    Standing Status:   Future    Expected Date:   09/15/2024    Expiration Date:   09/15/2025   CBC with Differential (Cancer Center Only)    Standing Status:   Future    Expected Date:   10/13/2024    Expiration Date:   10/13/2025   All questions were answered. The patient knows to call the clinic with any problems, questions or concerns. No barriers to learning was detected. The total time spent in the appointment was 30 minutes, including review of chart and various tests results, discussions about plan of care and coordination of care plan     Onita Mattock, MD 04/21/2024

## 2024-04-21 NOTE — Progress Notes (Signed)
 Verbal order from Dr. Lanny OK To Treat today w/ANC 0.8.  Pt is scheduled to receive C15D1 of Daratumumab  Fastpro SubQ.

## 2024-04-21 NOTE — Assessment & Plan Note (Signed)
 IgG lambda type, stage II, high risk -Diagnosed with CRAB criteria and bone marrow bx 05/01/2022, pathology confirmed plasma cell myeloma (60% plasma cells). Cytogenics and FISH c/w standard risk multiple myeloma except the deletion of 1P and gain of 1 q. which is a higher risk feature.  She is being treated as high risk -S/p 5 cycles induction DaraVRd starting 05/15/22 followed by autologous stem cell transplant 10/12/22 by Dr. Rosaria Ferries. Post transplant PET and marrow showed no evidence of disease -she received one cycle of DaraVRd to complete a total of 6 cycles -will continue dara and revlimid maintenance, with daratumumab injection 1800 mg every month for 2 years and Revlimid 10 mg on days 1-21 every 28 days, she started on March 18, 2023. -Will check her multiple myeloma labs and f/u every 2 months, M protein negative and light chain levels normal on 10/2023, she remains in remission.  -She will also follow-up with her transplant team

## 2024-04-22 ENCOUNTER — Other Ambulatory Visit: Payer: Self-pay

## 2024-04-22 DIAGNOSIS — E559 Vitamin D deficiency, unspecified: Secondary | ICD-10-CM

## 2024-04-22 LAB — PROTEIN ELECTROPHORESIS, SERUM
A/G Ratio: 1.6 (ref 0.7–1.7)
Albumin ELP: 3.7 g/dL (ref 2.9–4.4)
Alpha-1-Globulin: 0.2 g/dL (ref 0.0–0.4)
Alpha-2-Globulin: 0.7 g/dL (ref 0.4–1.0)
Beta Globulin: 0.9 g/dL (ref 0.7–1.3)
Gamma Globulin: 0.5 g/dL (ref 0.4–1.8)
Globulin, Total: 2.3 g/dL (ref 2.2–3.9)
M-Spike, %: 0.1 g/dL — ABNORMAL HIGH
Total Protein ELP: 6 g/dL (ref 6.0–8.5)

## 2024-04-22 LAB — KAPPA/LAMBDA LIGHT CHAINS
Kappa free light chain: 8.7 mg/L (ref 3.3–19.4)
Kappa, lambda light chain ratio: 1.45 (ref 0.26–1.65)
Lambda free light chains: 6 mg/L (ref 5.7–26.3)

## 2024-04-22 MED ORDER — VITAMIN D (ERGOCALCIFEROL) 1.25 MG (50000 UNIT) PO CAPS: 50000.0000 [IU] | ORAL_CAPSULE | ORAL | Status: DC

## 2024-04-22 NOTE — Telephone Encounter (Signed)
 Medication dose updated on med list.

## 2024-04-22 NOTE — Progress Notes (Signed)
 Faxed dental clearance for Zometa to Dr. Francis La w/[facility] [phone]  [phone].  Fax confirmation received.

## 2024-04-22 NOTE — Telephone Encounter (Signed)
 Enter in med profile pt taking Vit D3 twice a week, thx

## 2024-04-27 ENCOUNTER — Encounter: Payer: Self-pay | Admitting: Family

## 2024-04-27 LAB — IFE, DARA-SPECIFIC, SERUM
IgA: 16 mg/dL — ABNORMAL LOW (ref 87–352)
IgG (Immunoglobin G), Serum: 640 mg/dL (ref 586–1602)
IgM (Immunoglobulin M), Srm: 15 mg/dL — ABNORMAL LOW (ref 26–217)

## 2024-04-30 ENCOUNTER — Telehealth: Payer: Self-pay | Admitting: Hematology

## 2024-04-30 NOTE — Telephone Encounter (Signed)
 Scheduled appointments per WQ. Talked with the patient and she is aware of all made appointments.

## 2024-05-11 ENCOUNTER — Other Ambulatory Visit: Payer: Self-pay | Admitting: Nurse Practitioner

## 2024-05-11 ENCOUNTER — Ambulatory Visit: Payer: BC Managed Care – PPO

## 2024-05-11 DIAGNOSIS — E559 Vitamin D deficiency, unspecified: Secondary | ICD-10-CM

## 2024-05-15 ENCOUNTER — Other Ambulatory Visit: Payer: Self-pay

## 2024-05-15 DIAGNOSIS — C9001 Multiple myeloma in remission: Secondary | ICD-10-CM

## 2024-05-15 MED ORDER — LENALIDOMIDE 10 MG PO CAPS: 10.0000 mg | ORAL_CAPSULE | Freq: Every day | ORAL | 0 refills | Status: DC

## 2024-05-16 ENCOUNTER — Encounter (HOSPITAL_COMMUNITY): Payer: Self-pay

## 2024-05-16 ENCOUNTER — Ambulatory Visit (HOSPITAL_COMMUNITY): Payer: Self-pay | Admitting: Internal Medicine

## 2024-05-16 ENCOUNTER — Ambulatory Visit (INDEPENDENT_AMBULATORY_CARE_PROVIDER_SITE_OTHER)

## 2024-05-16 ENCOUNTER — Ambulatory Visit (HOSPITAL_COMMUNITY)
Admission: EM | Admit: 2024-05-16 | Discharge: 2024-05-16 | Disposition: A | Discharge status: Home / Self Care | Attending: Internal Medicine | Admitting: Internal Medicine

## 2024-05-16 DIAGNOSIS — R6884 Jaw pain: Secondary | ICD-10-CM

## 2024-05-16 DIAGNOSIS — K140 Glossitis: Secondary | ICD-10-CM | POA: Diagnosis not present

## 2024-05-16 DIAGNOSIS — J028 Acute pharyngitis due to other specified organisms: Secondary | ICD-10-CM

## 2024-05-16 LAB — POCT RAPID STREP A (OFFICE): Rapid Strep A Screen: NEGATIVE

## 2024-05-16 MED ORDER — AMOXICILLIN 875 MG PO TABS: 875.0000 mg | ORAL_TABLET | Freq: Two times a day (BID) | ORAL | 0 refills | Status: AC

## 2024-05-16 NOTE — ED Triage Notes (Signed)
 Patient woke up with a bump under her tongue this morning. Patient states she has swollen glands. Denies any fever or vomiting. Home Intervention: None

## 2024-05-16 NOTE — Discharge Instructions (Addendum)
 Strep testing done today was negative. Symptoms and physical exam findings are consistent with acute pharyngitis/tonsilitis, however the area under the tongue is an area that will need to be monitor closely. Due to the symptoms and the history of taking Zemaira, we did check a mandible x-ray. We have done the following today:    Amoxicillin  875 mg twice daily for 7 days.  This is an antibiotic.  Take this with food. X-ray of the mandible done today (jaw). The final evaluation by the radiologist by the radiologist is still pending but on brief evaluation there does not appear to be any acute abnormalities.  After radiology has interpreted the x-ray, if there are any changes we will contact you.  If there are no changes, the x-ray will be available on your MyChart. Monitor symptoms, especially the area under the tongue, if this does not resolve, please make sure to mention this to your oncologist at your appointment on August 13. If the area enlarges significantly, you develop fevers, difficulty swallowing, difficulty breathing then recommend going to the emergency room for further evaluation.

## 2024-05-16 NOTE — ED Provider Notes (Signed)
 [facility]    CSN: [id number] Arrival date & time: 05/16/24  1005      History   Chief Complaint Chief Complaint  Patient presents with   Oral Swelling    HPI Alyssa Dudley is a 68 y.o. female.   68 year old female who presents urgent care with complaints of sore throat, Naren Benally spots in the back of the throat, soreness around the jaw and ulceration under the tongue.  She reports that the symptoms started last night.  She got concerned this morning when the area under her tongue became more swollen.  She has not had any fevers or chills.  She denies any cough, congestion, shortness of breath or recent sick contacts. She does have MM that is in remission but follows up with oncology regular and is seeing them on 8/13.      Past Medical History:  Diagnosis Date   Allergy    once a year    Arthritis    Cancer (HCC) 05/08/22   CHF (congestive heart failure) (HCC) 2014   Hyperlipidemia    borderline- no meds    Hypertension 03/2015   Pacemaker    Paroxysmal atrial fibrillation (HCC) 03/2014   detected on ppm interrogation   PONV (postoperative nausea and vomiting)    has vomitted in past   Salivary stone 04/05/2021   Second degree Mobitz II AV block    Vitamin D  deficiency     Patient Active Problem List   Diagnosis Date Noted   Dyspareunia, female 04/20/2024   Biventricular cardiac pacemaker in situ 04/20/2024   Chronic pruritic rash in adult 12/26/2023   Multiple myeloma (HCC) 05/08/2022   History of renal calculi 05/17/2021   Osteoarthritis of multiple joints 04/05/2021   Meralgia paraesthetica, left 02/13/2018   Plantar fasciitis of left foot 02/13/2018   HTN (hypertension) 08/17/2014   Borderline type 2 diabetes mellitus 08/17/2014   PAF (paroxysmal atrial fibrillation) (HCC) 06/07/2014   Hyperlipidemia    Vitamin D  deficiency    Mobitz type 2 second degree and high-grade heart block 01/07/2013   Family history of malignant neoplasm of  gastrointestinal tract 10/15/2011   Colon polyps 10/15/2011    Past Surgical History:  Procedure Laterality Date   BREAST CYST EXCISION  1980   CESAREAN SECTION  09/1977   COLONOSCOPY     EXTRACORPOREAL SHOCK WAVE LITHOTRIPSY Right 12/28/2019   Procedure: RIGHT EXTRACORPOREAL SHOCK WAVE LITHOTRIPSY (ESWL);  Surgeon: Renda Glance, MD;  Location: [facility];  Service: Urology;  Laterality: Right;   PERMANENT PACEMAKER INSERTION N/A 01/13/2013   SJM Accent DR RF implanted by Dr Kelsie for mobitz II AV block   PLANTAR FASCIECTOMY     POLYPECTOMY     PPM GENERATOR CHANGEOUT N/A 07/08/2023   Procedure: PPM GENERATOR CHANGEOUT;  Surgeon: Cindie Ole DASEN, MD;  Location: [facility] INVASIVE CV LAB;  Service: Cardiovascular;  Laterality: N/A;   WRIST SURGERY Right 2011   removed cyst    OB History   No obstetric history on file.      Home Medications    Prior to Admission medications   Medication Sig Start Date End Date Taking? Authorizing Provider  amLODipine  (NORVASC ) 5 MG tablet Take 1 tablet (5 mg total) by mouth daily. 04/17/24  Yes Hudnell, Corean, NP  amoxicillin  (AMOXIL ) 875 MG tablet Take 1 tablet (875 mg total) by mouth 2 (two) times daily for 10 days. 05/16/24 05/26/24 Yes Letita Prentiss A, PA-C  aspirin  81 MG chewable tablet  Chew 81 mg by mouth daily.   Yes [provider]  Calcium  Carb-Cholecalciferol (CALCIUM  + VITAMIN D3 PO) Take 1 tablet by mouth daily.   Yes [provider]  lenalidomide  (REVLIMID ) 10 MG capsule Take 1 capsule (10 mg total) by mouth daily. Take 1 capsule (10 mg total) by mouth daily for 21 days. And then 7 days off for a 28 day cycle. [employer] Auth # [id number] expires 05/15/2024 05/15/24  Yes Lanny Callander, MD  Multiple Vitamin (MULTIVITAMIN) tablet Take 1 tablet by mouth daily.   Yes [provider]  triamcinolone  cream (KENALOG ) 0.1 % APPLY EXTERNALLY TO THE AFFECTED AREA THREE TIMES DAILY AS NEEDED FOR RASH 12/26/23  Yes  Hudnell, Stephanie, NP  Vitamin D , Ergocalciferol , (DRISDOL ) 1.25 MG (50000 UNIT) CAPS capsule Take 1 capsule (50,000 Units total) by mouth 2 (two) times a week. 04/23/24  Yes Hudnell, Corean, NP  acyclovir  (ZOVIRAX ) 400 MG tablet Take 1 tablet (400 mg total) by mouth 2 (two) times daily. Patient not taking: Reported on 04/21/2024 03/07/23   Lanny Callander, MD  Estradiol  10 MCG INST Place 10 mcg vaginally as directed. Insert nightly for 1 week, then every other night for 1 week, then 3 nights per week ongoing. Contact office for refill. 04/17/24   Lucius Corean, NP    Family History Family History  Problem Relation Age of Onset   Cervical cancer Mother    Hypertension Mother    Diabetes Mother    Arthritis Mother    Cancer Mother    Heart disease Father    Hyperlipidemia Father    Colon cancer Sister 75   Anemia Sister    Cancer Sister    Diabetes Sister    Hypertension Sister    Miscarriages / [place] Sister    Leukemia Sister    Cancer Sister    Diabetes Sister    Hypertension Sister    Prostate cancer Brother 83   Cancer Brother    HIV/AIDS Brother    Multiple myeloma Son 40   Hypertension Son    Kidney disease Son    ADD / ADHD Son    Cancer Son    Multiple myeloma Maternal Aunt    Alcohol abuse Brother    Colon polyps Neg Hx    Rectal cancer Neg Hx    Stomach cancer Neg Hx    Liver disease Neg Hx    Esophageal cancer Neg Hx     Social History Social History   Tobacco Use   Smoking status: Never   Smokeless tobacco: Never  Vaping Use   Vaping status: Never Used  Substance Use Topics   Alcohol use: No   Drug use: No     Allergies   Patient has no known allergies.   Review of Systems Review of Systems  Constitutional:  Negative for chills and fever.  HENT:  Positive for mouth sores (Under tongue), sore throat and trouble swallowing. Negative for ear pain.        Tenderness around jaw from swollen lymph nodes  Eyes:  Negative for pain and visual  disturbance.  Respiratory:  Negative for cough and shortness of breath.   Cardiovascular:  Negative for chest pain and palpitations.  Gastrointestinal:  Negative for abdominal pain and vomiting.  Genitourinary:  Negative for dysuria and hematuria.  Musculoskeletal:  Negative for arthralgias and back pain.  Skin:  Negative for color change and rash.  Neurological:  Negative for seizures and syncope.  All other systems  reviewed and are negative.    Physical Exam Triage Vital Signs ED Triage Vitals [05/16/24 1016]  Encounter Vitals Group     BP (!) 150/86     Girls Systolic BP Percentile      Girls Diastolic BP Percentile      Boys Systolic BP Percentile      Boys Diastolic BP Percentile      Pulse Rate 82     Resp 18     Temp 98.6 F (37 C)     Temp Source Oral     SpO2 98 %     Weight      Height      Head Circumference      Peak Flow      Pain Score      Pain Loc      Pain Education      Exclude from Growth Chart    No data found.  Updated Vital Signs BP (!) 150/86 (BP Location: Left Arm)   Pulse 82   Temp 98.6 F (37 C) (Oral)   Resp 18   SpO2 98%   Visual Acuity Right Eye Distance:   Left Eye Distance:   Bilateral Distance:    Right Eye Near:   Left Eye Near:    Bilateral Near:     Physical Exam Vitals and nursing note reviewed.  Constitutional:      General: She is not in acute distress.    Appearance: She is well-developed.  HENT:     Head: Normocephalic and atraumatic.     Mouth/Throat:     Mouth: Mucous membranes are moist.     Tongue: Lesions (sublingual, erythematous) present.     Pharynx: Pharyngeal swelling, oropharyngeal exudate and posterior oropharyngeal erythema present.     Tonsils: Tonsillar exudate present. 1+ on the right. 1+ on the left.  Eyes:     Conjunctiva/sclera: Conjunctivae normal.  Cardiovascular:     Rate and Rhythm: Normal rate and regular rhythm.     Heart sounds: No murmur heard. Pulmonary:     Effort: Pulmonary  effort is normal. No respiratory distress.     Breath sounds: Normal breath sounds.  Abdominal:     Palpations: Abdomen is soft.     Tenderness: There is no abdominal tenderness.  Musculoskeletal:        General: No swelling.     Cervical back: Neck supple.  Lymphadenopathy:     Head:     Right side of head: Submandibular adenopathy present.     Left side of head: Submandibular adenopathy present.     Comments: Tenderness along the mandibular border but no erythema or severe pain  Skin:    General: Skin is warm and dry.     Capillary Refill: Capillary refill takes less than 2 seconds.  Neurological:     Mental Status: She is alert.  Psychiatric:        Mood and Affect: Mood normal.      UC Treatments / Results  Labs (all labs ordered are listed, but only abnormal results are displayed) Labs Reviewed  POCT RAPID STREP A (OFFICE)    EKG   Radiology No results found.  Procedures Procedures (including critical care time)  Medications Ordered in UC Medications - No data to display  Initial Impression / Assessment and Plan / UC Course  I have reviewed the triage vital signs and the nursing notes.  Pertinent labs & imaging results that were available during my care  of the patient were reviewed by me and considered in my medical decision making (see chart for details).     Mandibular pain - Plan: DG Mandible 1-3 Views, DG Mandible 1-3 Views  Acute pharyngitis due to other specified organisms  Sublingual infection   Strep testing done today was negative. Symptoms and physical exam findings are consistent with acute pharyngitis/tonsilitis, however the area under the tongue is an area that will need to be monitor closely. Due to the symptoms and the history of taking Zemaira, we did check a mandible x-ray. We have done the following today:    Amoxicillin  875 mg twice daily for 7 days.  This is an antibiotic.  Take this with food. X-ray of the mandible done today (jaw).  The final evaluation by the radiologist by the radiologist is still pending but on brief evaluation there does not appear to be any acute abnormalities.  After radiology has interpreted the x-ray, if there are any changes we will contact you.  If there are no changes, the x-ray will be available on your MyChart. Monitor symptoms, especially the area under the tongue, if this does not resolve, please make sure to mention this to your oncologist at your appointment on August 13. If the area enlarges significantly, you develop fevers, difficulty swallowing, difficulty breathing then recommend going to the emergency room for further evaluation.  Final Clinical Impressions(s) / UC Diagnoses   Final diagnoses:  Mandibular pain  Acute pharyngitis due to other specified organisms  Sublingual infection     Discharge Instructions      Strep testing done today was negative. Symptoms and physical exam findings are consistent with acute pharyngitis/tonsilitis, however the area under the tongue is an area that will need to be monitor closely. Due to the symptoms and the history of taking Zemaira, we did check a mandible x-ray. We have done the following today:    Amoxicillin  875 mg twice daily for 7 days.  This is an antibiotic.  Take this with food. X-ray of the mandible done today (jaw). The final evaluation by the radiologist by the radiologist is still pending but on brief evaluation there does not appear to be any acute abnormalities.  After radiology has interpreted the x-ray, if there are any changes we will contact you.  If there are no changes, the x-ray will be available on your MyChart. Monitor symptoms, especially the area under the tongue, if this does not resolve, please make sure to mention this to your oncologist at your appointment on August 13. If the area enlarges significantly, you develop fevers, difficulty swallowing, difficulty breathing then recommend going to the emergency room for  further evaluation.    ED Prescriptions     Medication Sig Dispense Auth. Provider   amoxicillin  (AMOXIL ) 875 MG tablet Take 1 tablet (875 mg total) by mouth 2 (two) times daily for 10 days. 20 tablet Teresa Almarie LABOR, [place]      PDMP not reviewed this encounter.   Teresa Almarie LABOR, [place] 05/16/24 1116

## 2024-05-19 ENCOUNTER — Other Ambulatory Visit: Payer: Self-pay

## 2024-05-19 DIAGNOSIS — C9001 Multiple myeloma in remission: Secondary | ICD-10-CM

## 2024-05-20 ENCOUNTER — Inpatient Hospital Stay: Attending: Hematology

## 2024-05-20 ENCOUNTER — Inpatient Hospital Stay

## 2024-05-20 ENCOUNTER — Inpatient Hospital Stay: Admitting: Hematology

## 2024-05-20 ENCOUNTER — Encounter: Payer: Self-pay | Admitting: Hematology

## 2024-05-20 VITALS — BP 120/60 | HR 74 | Temp 97.6°F | Resp 15 | Ht 65.0 in | Wt 189.8 lb

## 2024-05-20 VITALS — BP 129/69 | HR 60 | Temp 97.8°F | Resp 15

## 2024-05-20 DIAGNOSIS — Z5112 Encounter for antineoplastic immunotherapy: Secondary | ICD-10-CM | POA: Diagnosis present

## 2024-05-20 DIAGNOSIS — Z79620 Long term (current) use of immunosuppressive biologic: Secondary | ICD-10-CM | POA: Diagnosis not present

## 2024-05-20 DIAGNOSIS — C9001 Multiple myeloma in remission: Secondary | ICD-10-CM

## 2024-05-20 DIAGNOSIS — E559 Vitamin D deficiency, unspecified: Secondary | ICD-10-CM | POA: Insufficient documentation

## 2024-05-20 DIAGNOSIS — C9000 Multiple myeloma not having achieved remission: Secondary | ICD-10-CM | POA: Insufficient documentation

## 2024-05-20 LAB — CBC WITH DIFFERENTIAL (CANCER CENTER ONLY)
Abs Immature Granulocytes: 0 K/uL (ref 0.00–0.07)
Basophils Absolute: 0.1 K/uL (ref 0.0–0.1)
Basophils Relative: 1 %
Eosinophils Absolute: 0.1 K/uL (ref 0.0–0.5)
Eosinophils Relative: 3 %
HCT: 37.5 % (ref 36.0–46.0)
Hemoglobin: 12.8 g/dL (ref 12.0–15.0)
Immature Granulocytes: 0 %
Lymphocytes Relative: 56 %
Lymphs Abs: 2.4 K/uL (ref 0.7–4.0)
MCH: 31.1 pg (ref 26.0–34.0)
MCHC: 34.1 g/dL (ref 30.0–36.0)
MCV: 91.2 fL (ref 80.0–100.0)
Monocytes Absolute: 0.6 K/uL (ref 0.1–1.0)
Monocytes Relative: 14 %
Neutro Abs: 1.1 K/uL — ABNORMAL LOW (ref 1.7–7.7)
Neutrophils Relative %: 26 %
Platelet Count: 216 K/uL (ref 150–400)
RBC: 4.11 MIL/uL (ref 3.87–5.11)
RDW: 14.9 % (ref 11.5–15.5)
WBC Count: 4.3 K/uL (ref 4.0–10.5)
nRBC: 0 % (ref 0.0–0.2)

## 2024-05-20 LAB — CMP (CANCER CENTER ONLY)
ALT: 22 U/L (ref 0–44)
AST: 17 U/L (ref 15–41)
Albumin: 4.2 g/dL (ref 3.5–5.0)
Alkaline Phosphatase: 135 U/L — ABNORMAL HIGH (ref 38–126)
Anion gap: 4 — ABNORMAL LOW (ref 5–15)
BUN: 11 mg/dL (ref 8–23)
CO2: 30 mmol/L (ref 22–32)
Calcium: 9.3 mg/dL (ref 8.9–10.3)
Chloride: 107 mmol/L (ref 98–111)
Creatinine: 0.76 mg/dL (ref 0.44–1.00)
GFR, Estimated: >60 mL/min (ref >60)
Glucose, Bld: 89 mg/dL (ref 70–99)
Potassium: 4.1 mmol/L (ref 3.5–5.1)
Sodium: 141 mmol/L (ref 135–145)
Total Bilirubin: 0.6 mg/dL (ref 0.0–1.2)
Total Protein: 6.5 g/dL (ref 6.5–8.1)

## 2024-05-20 MED ORDER — SODIUM CHLORIDE 0.9 % IV SOLN: INTRAVENOUS | Status: DC

## 2024-05-20 MED ORDER — ZOLEDRONIC ACID 4 MG/100ML IV SOLN
4.0000 mg | Freq: Once | INTRAVENOUS | Status: AC
  Administered 2024-05-20 (×2): 4 mg via INTRAVENOUS
  Filled 2024-05-20: qty 100

## 2024-05-20 NOTE — Progress Notes (Signed)
 [facility]   Telephone:([phone]) [phone] [phone]) [phone]   Clinic Follow up Note   Patient Care Team: Lucius Krabbe, NP as PCP - General (Family Medicine) Cindie Ole DASEN, MD as PCP - Electrophysiology (Cardiology) Jakie Alm SAUNDERS, MD as Consulting Physician (Gastroenterology) Lita Lye, OD as Referring Physician (Optometry) Okey Vina GAILS, MD as Consulting Physician (Cardiology) Murrell Kuba, MD as Consulting Physician (Orthopedic Surgery) Arman Dorn Loving, MD as Referring Physician (Hematology and Oncology) Lanny Callander, MD as Attending Physician (Hematology and Oncology)  Date of Service:  05/20/2024  CHIEF COMPLAINT: f/u of multiple myeloma  CURRENT THERAPY:  Maintenance daratumumab  and Revlimid   Oncology History   Multiple myeloma (HCC)  IgG lambda type, stage II, high risk -Diagnosed with CRAB criteria and bone marrow bx 05/01/2022, pathology confirmed plasma cell myeloma (60% plasma cells). Cytogenics and FISH c/w standard risk multiple myeloma except the deletion of 1P and gain of 1 q. which is a higher risk feature.  She is being treated as high risk -S/p 5 cycles induction DaraVRd starting 05/15/22 followed by autologous stem cell transplant 10/12/22 by Dr. Arman. Post transplant PET and marrow showed no evidence of disease -she received one cycle of DaraVRd to complete a total of 6 cycles -will continue dara and revlimid  maintenance, with daratumumab  injection 1800 mg every month for 2 years and Revlimid  10 mg on days 1-21 every 28 days, she started on March 18, 2023. -Will check her multiple myeloma labs and f/u every 2 months, M protein negative and light chain levels normal on 10/2023, she remains in remission.  -She will also follow-up with her transplant team  Assessment & Plan Multiple myeloma Currently receiving Zometa  infusions every three months to strengthen bones. Cleared by dentist for today's infusion. On Revlimid  as part of  long-term treatment plan. Discussed potential side effects of Zometa  infusion, including body aches and low-grade fever, manageable with Tylenol  and hydration. Emphasized importance of avoiding invasive procedures for a month after and before next infusion to prevent complications. - Administer Zometa  infusion today - Continue Revlimid  as prescribed - Schedule next Zometa  infusion in three months - Avoid invasive procedures for one month after and before the next infusion - Ensure no dental procedures are scheduled within the restricted timeframe - Get a flu shot when available  Vitamin D  deficiency Vitamin D  deficiency with high-dose regimen twice a week due to low levels and bone health concerns. Currently out of vitamin D  and needs follow-up with primary care provider for further management. - Follow up with primary care provider regarding vitamin D  prescription  Plan - Lab reviewed, will proceed with first Zometa  infusion today and continue every 3 months for 2 years - He is scheduled for daratumumab  injection next week - She will continue Revlimid  at home - Lab and follow-up on June 23, 2024 before daratumumab  injection   SUMMARY OF ONCOLOGIC HISTORY: Oncology History Overview Note   Cancer Staging  Multiple myeloma (HCC) Staging form: Plasma Cell Myeloma and Plasma Cell Disorders, AJCC 8th Edition - Clinical stage from 05/01/2022: RISS Stage II (Beta-2 -microglobulin (mg/L): 3.2, Albumin (g/dL): 3.6, ISS: Stage I, High-risk cytogenetics: Absent, LDH: Elevated) - Signed by Lanny Callander, MD on 06/03/2022 Stage prefix: Initial diagnosis Beta 2 microglobulin range (mg/L): Less than 3.5 Albumin range (g/dL): Greater than or equal to 3.5 Cytogenetics: 1p deletion     Multiple myeloma (HCC)  05/01/2022 Cancer Staging   Staging form: Plasma Cell Myeloma and Plasma Cell Disorders, AJCC 8th Edition -  Clinical stage from 05/01/2022: RISS Stage II (Beta-2 -microglobulin (mg/L): 3.2, Albumin  (g/dL): 3.6, ISS: Stage I, High-risk cytogenetics: Absent, LDH: Elevated) - Signed by Lanny Callander, MD on 06/03/2022 Stage prefix: Initial diagnosis Beta 2 microglobulin range (mg/L): Less than 3.5 Albumin range (g/dL): Greater than or equal to 3.5 Cytogenetics: 1p deletion   05/08/2022 Initial Diagnosis   Multiple myeloma (HCC)   05/14/2022 - 02/28/2023 Chemotherapy   Patient is on Treatment Plan : MYELOMA NEWLY DIAGNOSED TRANSPLANT CANDIDATE DaraVRd (Daratumumab  IV) q21d x 6 Cycles (Induction/Consolidation)     05/15/2022 - 05/28/2022 Chemotherapy   Patient is on Treatment Plan : MYELOMA NEWLY DIAGNOSED TRANSPLANT CANDIDATE DaraVRd (Daratumumab  IV) q21d x 6 Cycles (Induction/Consolidation)     03/18/2023 -  Chemotherapy   Patient is on Treatment Plan : MYELOMA Daratumumab  SQ q28d        Discussed the use of AI scribe software for clinical note transcription with the patient, who gave verbal consent to proceed.  History of Present Illness Alyssa Dudley is a 68 year old female with multiple myeloma who presents for follow-up.  She is on a treatment regimen with Revlimid  and Zometa , receiving Zometa  infusions every three months. Her next infusion is scheduled for today.  She recently had dental procedures for an abscess, which resolved without infection. Her dentist has cleared her for today's infusion. She was treated with amoxicillin  for a suspected strep throat.  Her medications include Revlimid , Zometa , and vitamin D  supplements, which she takes twice a week due to previously low levels.     All other systems were reviewed with the patient and are negative.  MEDICAL HISTORY:  Past Medical History:  Diagnosis Date   Allergy    once a year    Arthritis    Cancer (HCC) 05/08/22   CHF (congestive heart failure) (HCC) 2014   Hyperlipidemia    borderline- no meds    Hypertension 03/2015   Pacemaker    Paroxysmal atrial fibrillation (HCC) 03/2014   detected on ppm interrogation   PONV  (postoperative nausea and vomiting)    has vomitted in past   Salivary stone 04/05/2021   Second degree Mobitz II AV block    Vitamin D  deficiency     SURGICAL HISTORY: Past Surgical History:  Procedure Laterality Date   BREAST CYST EXCISION  1980   CESAREAN SECTION  09/1977   COLONOSCOPY     EXTRACORPOREAL SHOCK WAVE LITHOTRIPSY Right 12/28/2019   Procedure: RIGHT EXTRACORPOREAL SHOCK WAVE LITHOTRIPSY (ESWL);  Surgeon: Renda Glance, MD;  Location: [facility];  Service: Urology;  Laterality: Right;   PERMANENT PACEMAKER INSERTION N/A 01/13/2013   SJM Accent DR RF implanted by Dr Kelsie for mobitz II AV block   PLANTAR FASCIECTOMY     POLYPECTOMY     PPM GENERATOR CHANGEOUT N/A 07/08/2023   Procedure: PPM GENERATOR CHANGEOUT;  Surgeon: Cindie Ole DASEN, MD;  Location: [facility] INVASIVE CV LAB;  Service: Cardiovascular;  Laterality: N/A;   WRIST SURGERY Right 2011   removed cyst    I have reviewed the social history and family history with the patient and they are unchanged from previous note.  ALLERGIES:  has no known allergies.  MEDICATIONS:  Current Outpatient Medications  Medication Sig Dispense Refill   acyclovir  (ZOVIRAX ) 400 MG tablet Take 1 tablet (400 mg total) by mouth 2 (two) times daily. (Patient not taking: Reported on 04/21/2024) 60 tablet 5   amLODipine  (NORVASC ) 5 MG tablet Take 1 tablet (5 mg total)  by mouth daily. 90 tablet 1   amoxicillin  (AMOXIL ) 875 MG tablet Take 1 tablet (875 mg total) by mouth 2 (two) times daily for 10 days. 20 tablet 0   aspirin  81 MG chewable tablet Chew 81 mg by mouth daily.     Calcium  Carb-Cholecalciferol (CALCIUM  + VITAMIN D3 PO) Take 1 tablet by mouth daily.     Estradiol  10 MCG INST Place 10 mcg vaginally as directed. Insert nightly for 1 week, then every other night for 1 week, then 3 nights per week ongoing. Contact office for refill. 20 each 0   lenalidomide  (REVLIMID ) 10 MG capsule Take 1 capsule (10 mg total) by  mouth daily. Take 1 capsule (10 mg total) by mouth daily for 21 days. And then 7 days off for a 28 day cycle. [employer] Auth # [id number] expires 05/15/2024 21 capsule 0   Multiple Vitamin (MULTIVITAMIN) tablet Take 1 tablet by mouth daily.     triamcinolone  cream (KENALOG ) 0.1 % APPLY EXTERNALLY TO THE AFFECTED AREA THREE TIMES DAILY AS NEEDED FOR RASH 30 g 2   Vitamin D , Ergocalciferol , (DRISDOL ) 1.25 MG (50000 UNIT) CAPS capsule Take 1 capsule (50,000 Units total) by mouth 2 (two) times a week.     Current Facility-Administered Medications  Medication Dose Route Frequency Provider Last Rate Last Admin   0.9 %  sodium chloride  infusion  500 mL Intravenous Once Pyrtle, Gordy HERO, MD        PHYSICAL EXAMINATION: ECOG PERFORMANCE STATUS: 0 - Asymptomatic  Vitals:   05/20/24 0943  BP: 120/60  Pulse: 74  Resp: 15  Temp: 97.6 F (36.4 C)  SpO2: 97%   Wt Readings from Last 3 Encounters:  05/20/24 189 lb 12.8 oz (86.1 kg)  04/21/24 190 lb 4.8 oz (86.3 kg)  04/17/24 187 lb 3.2 oz (84.9 kg)     GENERAL:alert, no distress and comfortable SKIN: skin color, texture, turgor are normal, no rashes or significant lesions EYES: normal, Conjunctiva are pink and non-injected, sclera clear NECK: supple, thyroid  normal size, non-tender, without nodularity LYMPH:  no palpable lymphadenopathy in the cervical, axillary  LUNGS: clear to auscultation and percussion with normal breathing effort HEART: regular rate & rhythm and no murmurs and no lower extremity edema ABDOMEN:abdomen soft, non-tender and normal bowel sounds Musculoskeletal:no cyanosis of digits and no clubbing  NEURO: alert & oriented x 3 with fluent speech, no focal motor/sensory deficits  Physical Exam    LABORATORY DATA:  I have reviewed the data as listed    Latest Ref Rng & Units 05/20/2024    9:38 AM 04/21/2024   12:38 PM 03/24/2024    9:21 AM  CBC  WBC 4.0 - 10.5 K/uL 4.3  3.0  2.8   Hemoglobin 12.0 - 15.0 g/dL 87.1  87.9  87.1    Hematocrit 36.0 - 46.0 % 37.5  34.8  37.5   Platelets 150 - 400 K/uL 216  186  183         Latest Ref Rng & Units 05/20/2024    9:38 AM 04/21/2024   12:38 PM 03/24/2024    9:21 AM  CMP  Glucose 70 - 99 mg/dL 89  87  896   BUN 8 - 23 mg/dL 11  10  11    Creatinine 0.44 - 1.00 mg/dL 9.23  9.26  9.20   Sodium 135 - 145 mmol/L 141  144  145   Potassium 3.5 - 5.1 mmol/L 4.1  3.7  4.1   Chloride 98 -  111 mmol/L 107  107  109   CO2 22 - 32 mmol/L 30  33  29   Calcium  8.9 - 10.3 mg/dL 9.3  9.4  9.3   Total Protein 6.5 - 8.1 g/dL 6.5  6.4  6.4   Total Bilirubin 0.0 - 1.2 mg/dL 0.6  0.6  0.7   Alkaline Phos 38 - 126 U/L 135  106  100   AST 15 - 41 U/L 17  18  17    ALT 0 - 44 U/L 22  21  17        RADIOGRAPHIC STUDIES: I have personally reviewed the radiological images as listed and agreed with the findings in the report. No results found.    No orders of the defined types were placed in this encounter.  All questions were answered. The patient knows to call the clinic with any problems, questions or concerns. No barriers to learning was detected. The total time spent in the appointment was 25 minutes, including review of chart and various tests results, discussions about plan of care and coordination of care plan     Onita Mattock, MD 05/20/2024

## 2024-05-20 NOTE — Patient Instructions (Signed)

## 2024-05-20 NOTE — Assessment & Plan Note (Signed)
 IgG lambda type, stage II, high risk -Diagnosed with CRAB criteria and bone marrow bx 05/01/2022, pathology confirmed plasma cell myeloma (60% plasma cells). Cytogenics and FISH c/w standard risk multiple myeloma except the deletion of 1P and gain of 1 q. which is a higher risk feature.  Alyssa Dudley is being treated as high risk -S/p 5 cycles induction DaraVRd starting 05/15/22 followed by autologous stem cell transplant 10/12/22 by Dr. Rosaria Ferries. Post transplant PET and marrow showed no evidence of disease -Alyssa Dudley received one cycle of DaraVRd to complete a total of 6 cycles -will continue dara and revlimid maintenance, with daratumumab injection 1800 mg every month for 2 years and Revlimid 10 mg on days 1-21 every 28 days, Alyssa Dudley started on March 18, 2023. -Will check her multiple myeloma labs and f/u every 2 months, M protein negative and light chain levels normal on 10/2023, Alyssa Dudley remains in remission.  -Alyssa Dudley will also follow-up with her transplant team

## 2024-05-25 ENCOUNTER — Other Ambulatory Visit: Payer: Self-pay

## 2024-05-25 DIAGNOSIS — E559 Vitamin D deficiency, unspecified: Secondary | ICD-10-CM

## 2024-05-25 MED ORDER — VITAMIN D (ERGOCALCIFEROL) 1.25 MG (50000 UNIT) PO CAPS: 50000.0000 [IU] | ORAL_CAPSULE | ORAL | 0 refills | Status: DC

## 2024-05-26 ENCOUNTER — Inpatient Hospital Stay

## 2024-05-26 ENCOUNTER — Other Ambulatory Visit: Payer: Self-pay

## 2024-05-26 VITALS — BP 143/93 | HR 65 | Temp 98.0°F | Resp 16 | Wt 186.8 lb

## 2024-05-26 DIAGNOSIS — C9000 Multiple myeloma not having achieved remission: Secondary | ICD-10-CM

## 2024-05-26 DIAGNOSIS — Z5112 Encounter for antineoplastic immunotherapy: Secondary | ICD-10-CM | POA: Diagnosis not present

## 2024-05-26 DIAGNOSIS — C9001 Multiple myeloma in remission: Secondary | ICD-10-CM

## 2024-05-26 LAB — CBC WITH DIFFERENTIAL (CANCER CENTER ONLY)
Abs Immature Granulocytes: 0.01 K/uL (ref 0.00–0.07)
Basophils Absolute: 0 K/uL (ref 0.0–0.1)
Basophils Relative: 1 %
Eosinophils Absolute: 0.1 K/uL (ref 0.0–0.5)
Eosinophils Relative: 4 %
HCT: 37.8 % (ref 36.0–46.0)
Hemoglobin: 13 g/dL (ref 12.0–15.0)
Immature Granulocytes: 0 %
Lymphocytes Relative: 45 %
Lymphs Abs: 1.8 K/uL (ref 0.7–4.0)
MCH: 31.2 pg (ref 26.0–34.0)
MCHC: 34.4 g/dL (ref 30.0–36.0)
MCV: 90.6 fL (ref 80.0–100.0)
Monocytes Absolute: 0.5 K/uL (ref 0.1–1.0)
Monocytes Relative: 14 %
Neutro Abs: 1.4 K/uL — ABNORMAL LOW (ref 1.7–7.7)
Neutrophils Relative %: 36 %
Platelet Count: 228 K/uL (ref 150–400)
RBC: 4.17 MIL/uL (ref 3.87–5.11)
RDW: 14.7 % (ref 11.5–15.5)
WBC Count: 3.9 K/uL — ABNORMAL LOW (ref 4.0–10.5)
nRBC: 0 % (ref 0.0–0.2)

## 2024-05-26 LAB — COMPREHENSIVE METABOLIC PANEL WITH GFR
ALT: 17 U/L (ref 0–44)
AST: 15 U/L (ref 15–41)
Albumin: 4.3 g/dL (ref 3.5–5.0)
Alkaline Phosphatase: 127 U/L — ABNORMAL HIGH (ref 38–126)
Anion gap: 6 (ref 5–15)
BUN: 8 mg/dL (ref 8–23)
CO2: 28 mmol/L (ref 22–32)
Calcium: 8.7 mg/dL — ABNORMAL LOW (ref 8.9–10.3)
Chloride: 107 mmol/L (ref 98–111)
Creatinine, Ser: 0.65 mg/dL (ref 0.44–1.00)
GFR, Estimated: >60 mL/min (ref >60)
Glucose, Bld: 92 mg/dL (ref 70–99)
Potassium: 4 mmol/L (ref 3.5–5.1)
Sodium: 141 mmol/L (ref 135–145)
Total Bilirubin: 0.7 mg/dL (ref 0.0–1.2)
Total Protein: 6.7 g/dL (ref 6.5–8.1)

## 2024-05-26 MED ORDER — DARATUMUMAB-HYALURONIDASE-FIHJ 1800-30000 MG-UT/15ML ~~LOC~~ SOLN
1800.0000 mg | Freq: Once | SUBCUTANEOUS | Status: AC
  Administered 2024-05-26: 1800 mg via SUBCUTANEOUS
  Filled 2024-05-26: qty 15

## 2024-05-26 NOTE — Progress Notes (Signed)
 Patient is here for her SQ Daratumumab  injection, ANC today is 1.4. Dr. Lanny notified and order given to proceed with treatment without modification.

## 2024-05-29 ENCOUNTER — Encounter: Payer: Self-pay | Admitting: Hematology

## 2024-06-01 ENCOUNTER — Other Ambulatory Visit: Payer: Self-pay | Admitting: Nurse Practitioner

## 2024-06-01 MED ORDER — IBUPROFEN 600 MG PO TABS: 600.0000 mg | ORAL_TABLET | Freq: Three times a day (TID) | ORAL | 0 refills | Status: AC | PRN

## 2024-06-05 ENCOUNTER — Encounter: Payer: Self-pay | Admitting: Hematology

## 2024-06-09 ENCOUNTER — Other Ambulatory Visit: Payer: Self-pay

## 2024-06-09 DIAGNOSIS — C9001 Multiple myeloma in remission: Secondary | ICD-10-CM

## 2024-06-09 MED ORDER — LENALIDOMIDE 10 MG PO CAPS
10.0000 mg | ORAL_CAPSULE | Freq: Every day | ORAL | 0 refills | Status: DC
Start: 2024-06-09 — End: 2024-07-09

## 2024-06-15 ENCOUNTER — Telehealth: Payer: Self-pay | Admitting: Cardiology

## 2024-06-15 NOTE — Telephone Encounter (Signed)
 Message sent to [facility] and his RN.

## 2024-06-15 NOTE — Telephone Encounter (Signed)
 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 She would like to be called once it is completed so she can come and pick it up. Her cell phone is the best contact phone number. She didn't drop off paperwork.  Date:06/15/2024

## 2024-06-18 NOTE — Telephone Encounter (Signed)
 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 This provider is requesting cardiac clearance due to her having a pacemaker.  Patient last in office appointment 10/08/23 and was doing well. Last remote device check 04/18/24 was normal.

## 2024-06-22 NOTE — Progress Notes (Unsigned)
 [facility]   Telephone:(336) [phone] Fax:(336) [phone]    Patient Care Team: Alyssa Krabbe, NP as PCP - General (Family Medicine) Alyssa Ole DASEN, MD as PCP - Electrophysiology (Cardiology) Alyssa Alm SAUNDERS, MD as Consulting Physician (Gastroenterology) Alyssa Dudley, OD as Referring Physician (Optometry) Alyssa Vina GAILS, MD as Consulting Physician (Cardiology) Alyssa Kuba, MD as Consulting Physician (Orthopedic Surgery) Alyssa Dorn Loving, MD as Referring Physician (Hematology and Oncology) Alyssa Callander, MD as Attending Physician (Hematology and Oncology)   CHIEF COMPLAINT: Follow up multiple myeloma   Oncology History Overview Note   Cancer Staging  Multiple myeloma [facility]) Staging form: Plasma Cell Myeloma and Plasma Cell Disorders, AJCC 8th Edition - Clinical stage from 05/01/2022: RISS Stage II (Beta-2 -microglobulin (mg/L): 3.2, Albumin (g/dL): 3.6, ISS: Stage I, High-risk cytogenetics: Absent, LDH: Elevated) - Signed by Alyssa Callander, MD on 06/03/2022 Stage prefix: Initial diagnosis Beta 2 microglobulin range (mg/L): Less than 3.5 Albumin range (g/dL): Greater than or equal to 3.5 Cytogenetics: 1p deletion     Multiple myeloma (HCC)  05/01/2022 Cancer Staging   Staging form: Plasma Cell Myeloma and Plasma Cell Disorders, AJCC 8th Edition - Clinical stage from 05/01/2022: RISS Stage II (Beta-2 -microglobulin (mg/L): 3.2, Albumin (g/dL): 3.6, ISS: Stage I, High-risk cytogenetics: Absent, LDH: Elevated) - Signed by Alyssa Callander, MD on 06/03/2022 Stage prefix: Initial diagnosis Beta 2 microglobulin range (mg/L): Less than 3.5 Albumin range (g/dL): Greater than or equal to 3.5 Cytogenetics: 1p deletion   05/08/2022 Initial Diagnosis   Multiple myeloma (HCC)   05/14/2022 - 02/28/2023 Chemotherapy   Patient is on Treatment Plan : MYELOMA NEWLY DIAGNOSED TRANSPLANT CANDIDATE DaraVRd (Daratumumab  IV) q21d x 6 Cycles (Induction/Consolidation)     05/15/2022 -  05/28/2022 Chemotherapy   Patient is on Treatment Plan : MYELOMA NEWLY DIAGNOSED TRANSPLANT CANDIDATE DaraVRd (Daratumumab  IV) q21d x 6 Cycles (Induction/Consolidation)     03/18/2023 -  Chemotherapy   Patient is on Treatment Plan : MYELOMA Daratumumab  SQ q28d        CURRENT THERAPY: Maintenance Daratumumab  q28 days   INTERVAL HISTORY [name] returns for follow up and treatment as scheduled.   ROS   Past Medical History:  Diagnosis Date   Allergy    once a year    Arthritis    Cancer (HCC) 05/08/22   CHF (congestive heart failure) (HCC) 2014   Hyperlipidemia    borderline- no meds    Hypertension 03/2015   Pacemaker    Paroxysmal atrial fibrillation (HCC) 03/2014   detected on ppm interrogation   PONV (postoperative nausea and vomiting)    has vomitted in past   Salivary stone 04/05/2021   Second degree Mobitz II AV block    Vitamin D  deficiency      Past Surgical History:  Procedure Laterality Date   BREAST CYST EXCISION  1980   CESAREAN SECTION  09/1977   COLONOSCOPY     EXTRACORPOREAL SHOCK WAVE LITHOTRIPSY Right 12/28/2019   Procedure: RIGHT EXTRACORPOREAL SHOCK WAVE LITHOTRIPSY (ESWL);  Surgeon: Renda Glance, MD;  Location: [facility];  Service: Urology;  Laterality: Right;   PERMANENT PACEMAKER INSERTION N/A 01/13/2013   SJM Accent DR RF implanted by Dr Kelsie for mobitz II AV block   PLANTAR FASCIECTOMY     POLYPECTOMY     PPM GENERATOR CHANGEOUT N/A 07/08/2023   Procedure: PPM GENERATOR CHANGEOUT;  Surgeon: Alyssa Ole DASEN, MD;  Location: [facility] INVASIVE CV LAB;  Service: Cardiovascular;  Laterality: N/A;  WRIST SURGERY Right 2011   removed cyst     Outpatient Encounter Medications as of 06/23/2024  Medication Sig   acyclovir  (ZOVIRAX ) 400 MG tablet Take 1 tablet (400 mg total) by mouth 2 (two) times daily. (Patient not taking: Reported on 04/21/2024)   amLODipine  (NORVASC ) 5 MG tablet Take 1 tablet (5 mg total) by mouth daily.   aspirin   81 MG chewable tablet Chew 81 mg by mouth daily.   Calcium  Carb-Cholecalciferol (CALCIUM  + VITAMIN D3 PO) Take 1 tablet by mouth daily.   Estradiol  10 MCG INST Place 10 mcg vaginally as directed. Insert nightly for 1 week, then every other night for 1 week, then 3 nights per week ongoing. Contact office for refill.   ibuprofen  (ADVIL ) 600 MG tablet Take 1 tablet (600 mg total) by mouth every 8 (eight) hours as needed.   lenalidomide  (REVLIMID ) 10 MG capsule Take 1 capsule (10 mg total) by mouth daily. Take 1 capsule (10 mg total) by mouth daily for 21 days. And then 7 days off for a 28 day cycle. [employer] Auth # [id number] expires 06/09/2024   Multiple Vitamin (MULTIVITAMIN) tablet Take 1 tablet by mouth daily.   triamcinolone  cream (KENALOG ) 0.1 % APPLY EXTERNALLY TO THE AFFECTED AREA THREE TIMES DAILY AS NEEDED FOR RASH   Vitamin D , Ergocalciferol , (DRISDOL ) 1.25 MG (50000 UNIT) CAPS capsule Take 1 capsule (50,000 Units total) by mouth 2 (two) times a week.   Facility-Administered Encounter Medications as of 06/23/2024  Medication   0.9 %  sodium chloride  infusion     There were no vitals filed for this visit. There is no height or weight on file to calculate BMI.   ECOG PERFORMANCE STATUS: {CHL ONC ECOG [phone]}  PHYSICAL EXAM GENERAL:alert, no distress and comfortable SKIN: no rash  EYES: sclera clear NECK: without mass LYMPH:  no palpable cervical or supraclavicular lymphadenopathy  LUNGS: clear with normal breathing effort HEART: regular rate & rhythm, no lower extremity edema ABDOMEN: abdomen soft, non-tender and normal bowel sounds NEURO: alert & oriented x 3 with fluent speech, no focal motor/sensory deficits Breast exam:  PAC without erythema    CBC    Latest Ref Rng & Units 05/26/2024    8:12 AM 05/20/2024    9:38 AM 04/21/2024   12:38 PM  CBC  WBC 4.0 - 10.5 K/uL 3.9  4.3  3.0   Hemoglobin 12.0 - 15.0 g/dL 86.9  87.1  87.9   Hematocrit 36.0 - 46.0 % 37.8  37.5   34.8   Platelets 150 - 400 K/uL 228  216  186       CMP     Latest Ref Rng & Units 05/26/2024    8:12 AM 05/20/2024    9:38 AM 04/21/2024   12:38 PM  CMP  Glucose 70 - 99 mg/dL 92  89  87   BUN 8 - 23 mg/dL 8  11  10    Creatinine 0.44 - 1.00 mg/dL 9.34  9.23  9.26   Sodium 135 - 145 mmol/L 141  141  144   Potassium 3.5 - 5.1 mmol/L 4.0  4.1  3.7   Chloride 98 - 111 mmol/L 107  107  107   CO2 22 - 32 mmol/L 28  30  33   Calcium  8.9 - 10.3 mg/dL 8.7  9.3  9.4   Total Protein 6.5 - 8.1 g/dL 6.7  6.5  6.4   Total Bilirubin 0.0 - 1.2 mg/dL 0.7  0.6  0.6   Alkaline Phos 38 - 126 U/L 127  135  106   AST 15 - 41 U/L 15  17  18    ALT 0 - 44 U/L 17  22  21        ASSESSMENT & PLAN:Alyssa Dudley is a 68 y.o. female with    1. Multiple Myeloma, IgG lambda type, stage II, high risk -Diagnosed with CRAB criteria and bone marrow bx 05/01/2022, pathology confirmed plasma cell myeloma (60% plasma cells). Cytogenics and FISH c/w standard risk multiple myeloma except the deletion of 1P and gain of 1 q. which is a higher risk feature.  She is being treated as high risk -S/p 5 cycles induction DaraVRd starting 05/15/22 followed by autologous stem cell transplant 10/12/22 by Dr. Arman. Post transplant PET and marrow showed no evidence of disease -completed 1 additional cycle of DaraVRd to complete a total of 6 cycles before proceeding with dara and revlimid  maintenance starting 03/18/23   3.  Atrial fibrillation, hypertension, pacemaker  -On daily aspirin     PLAN:  No orders of the defined types were placed in this encounter.     All questions were answered. The patient knows to call the clinic with any problems, questions or concerns. No barriers to learning were detected. I spent *** counseling the patient face to face. The total time spent in the appointment was *** and more than 50% was on counseling, review of test results, and coordination of care.   Lacie K Burton, NP 06/22/2024 8:12 PM

## 2024-06-23 ENCOUNTER — Inpatient Hospital Stay

## 2024-06-23 ENCOUNTER — Inpatient Hospital Stay: Attending: Hematology

## 2024-06-23 ENCOUNTER — Inpatient Hospital Stay: Attending: Hematology | Admitting: Physician Assistant

## 2024-06-23 VITALS — BP 167/90 | HR 64 | Temp 98.2°F | Resp 16 | Wt 183.0 lb

## 2024-06-23 DIAGNOSIS — C9000 Multiple myeloma not having achieved remission: Secondary | ICD-10-CM | POA: Insufficient documentation

## 2024-06-23 DIAGNOSIS — Z5112 Encounter for antineoplastic immunotherapy: Secondary | ICD-10-CM | POA: Insufficient documentation

## 2024-06-23 DIAGNOSIS — C9001 Multiple myeloma in remission: Secondary | ICD-10-CM

## 2024-06-23 DIAGNOSIS — Z79620 Long term (current) use of immunosuppressive biologic: Secondary | ICD-10-CM | POA: Diagnosis not present

## 2024-06-23 LAB — CMP (CANCER CENTER ONLY)
ALT: 16 U/L (ref 0–44)
AST: 15 U/L (ref 15–41)
Albumin: 4.5 g/dL (ref 3.5–5.0)
Alkaline Phosphatase: 127 U/L — ABNORMAL HIGH (ref 38–126)
Anion gap: 7 (ref 5–15)
BUN: 10 mg/dL (ref 8–23)
CO2: 28 mmol/L (ref 22–32)
Calcium: 9.6 mg/dL (ref 8.9–10.3)
Chloride: 107 mmol/L (ref 98–111)
Creatinine: 0.63 mg/dL (ref 0.44–1.00)
GFR, Estimated: >60 mL/min (ref >60)
Glucose, Bld: 94 mg/dL (ref 70–99)
Potassium: 3.8 mmol/L (ref 3.5–5.1)
Sodium: 142 mmol/L (ref 135–145)
Total Bilirubin: 0.7 mg/dL (ref 0.0–1.2)
Total Protein: 6.8 g/dL (ref 6.5–8.1)

## 2024-06-23 LAB — CBC WITH DIFFERENTIAL (CANCER CENTER ONLY)
Abs Immature Granulocytes: 0.01 K/uL (ref 0.00–0.07)
Basophils Absolute: 0 K/uL (ref 0.0–0.1)
Basophils Relative: 1 %
Eosinophils Absolute: 0.1 K/uL (ref 0.0–0.5)
Eosinophils Relative: 1 %
HCT: 38.5 % (ref 36.0–46.0)
Hemoglobin: 13.2 g/dL (ref 12.0–15.0)
Immature Granulocytes: 0 %
Lymphocytes Relative: 58 %
Lymphs Abs: 2 K/uL (ref 0.7–4.0)
MCH: 30.7 pg (ref 26.0–34.0)
MCHC: 34.3 g/dL (ref 30.0–36.0)
MCV: 89.5 fL (ref 80.0–100.0)
Monocytes Absolute: 0.5 K/uL (ref 0.1–1.0)
Monocytes Relative: 13 %
Neutro Abs: 1 K/uL — ABNORMAL LOW (ref 1.7–7.7)
Neutrophils Relative %: 27 %
Platelet Count: 211 K/uL (ref 150–400)
RBC: 4.3 MIL/uL (ref 3.87–5.11)
RDW: 14.8 % (ref 11.5–15.5)
WBC Count: 3.5 K/uL — ABNORMAL LOW (ref 4.0–10.5)
nRBC: 0 % (ref 0.0–0.2)

## 2024-06-23 MED ORDER — DARATUMUMAB-HYALURONIDASE-FIHJ 1800-30000 MG-UT/15ML ~~LOC~~ SOLN
1800.0000 mg | Freq: Once | SUBCUTANEOUS | Status: AC
  Administered 2024-06-23: 1800 mg via SUBCUTANEOUS
  Filled 2024-06-23: qty 15

## 2024-06-23 NOTE — Patient Instructions (Signed)

## 2024-06-24 LAB — KAPPA/LAMBDA LIGHT CHAINS
Kappa free light chain: 6.8 mg/L (ref 3.3–19.4)
Kappa, lambda light chain ratio: 1.03 (ref 0.26–1.65)
Lambda free light chains: 6.6 mg/L (ref 5.7–26.3)

## 2024-06-25 LAB — PROTEIN ELECTROPHORESIS, SERUM
A/G Ratio: 1.5 (ref 0.7–1.7)
Albumin ELP: 3.8 g/dL (ref 2.9–4.4)
Alpha-1-Globulin: 0.2 g/dL (ref 0.0–0.4)
Alpha-2-Globulin: 0.8 g/dL (ref 0.4–1.0)
Beta Globulin: 0.9 g/dL (ref 0.7–1.3)
Gamma Globulin: 0.7 g/dL (ref 0.4–1.8)
Globulin, Total: 2.5 g/dL (ref 2.2–3.9)
Total Protein ELP: 6.3 g/dL (ref 6.0–8.5)

## 2024-06-26 LAB — IFE, DARA-SPECIFIC, SERUM
IgA: 26 mg/dL — ABNORMAL LOW (ref 87–352)
IgG (Immunoglobin G), Serum: 736 mg/dL (ref 586–1602)
IgM (Immunoglobulin M), Srm: 22 mg/dL — ABNORMAL LOW (ref 26–217)

## 2024-07-09 ENCOUNTER — Ambulatory Visit: Payer: Medicare Other

## 2024-07-09 ENCOUNTER — Other Ambulatory Visit: Payer: Self-pay

## 2024-07-09 DIAGNOSIS — C9001 Multiple myeloma in remission: Secondary | ICD-10-CM

## 2024-07-09 DIAGNOSIS — I441 Atrioventricular block, second degree: Secondary | ICD-10-CM

## 2024-07-09 MED ORDER — LENALIDOMIDE 10 MG PO CAPS: 10.0000 mg | ORAL_CAPSULE | Freq: Every day | ORAL | 0 refills | Status: DC

## 2024-07-13 LAB — CUP PACEART REMOTE DEVICE CHECK
Battery Remaining Longevity: 97 mo
Battery Remaining Percentage: 92 %
Battery Voltage: 2.99 V
Brady Statistic AP VP Percent: 13 %
Brady Statistic AP VS Percent: 1 %
Brady Statistic AS VP Percent: 87 %
Brady Statistic AS VS Percent: 1 %
Brady Statistic RA Percent Paced: 13 %
Brady Statistic RV Percent Paced: 99 %
Date Time Interrogation Session: 20251004171434
Implantable Lead Connection Status: 753985
Implantable Lead Connection Status: 753985
Implantable Lead Implant Date: 20140408
Implantable Lead Implant Date: 20140408
Implantable Lead Location: 753859
Implantable Lead Location: 753860
Implantable Lead Model: 1944
Implantable Lead Model: 1948
Implantable Pulse Generator Implant Date: 20240930
Lead Channel Impedance Value: 490 Ohm
Lead Channel Impedance Value: 580 Ohm
Lead Channel Pacing Threshold Amplitude: 0.5 V
Lead Channel Pacing Threshold Amplitude: 0.75 V
Lead Channel Pacing Threshold Pulse Width: 0.5 ms
Lead Channel Pacing Threshold Pulse Width: 0.5 ms
Lead Channel Sensing Intrinsic Amplitude: 2.3 mV
Lead Channel Sensing Intrinsic Amplitude: 9.1 mV
Lead Channel Setting Pacing Amplitude: 2.5 V
Lead Channel Setting Pacing Amplitude: 2.5 V
Lead Channel Setting Pacing Pulse Width: 0.5 ms
Lead Channel Setting Sensing Sensitivity: 4 mV
Pulse Gen Model: 2272
Pulse Gen Serial Number: 8210888

## 2024-07-14 NOTE — Progress Notes (Signed)
 Remote PPM Transmission

## 2024-07-15 ENCOUNTER — Ambulatory Visit: Payer: Self-pay | Admitting: Cardiology

## 2024-07-16 NOTE — Progress Notes (Signed)
 Remote PPM Transmission

## 2024-07-20 NOTE — Assessment & Plan Note (Signed)
 IgG lambda type, stage II, high risk -Diagnosed with CRAB criteria and bone marrow bx 05/01/2022, pathology confirmed plasma cell myeloma (60% plasma cells). Cytogenics and FISH c/w standard risk multiple myeloma except the deletion of 1P and gain of 1 q. which is a higher risk feature.  She is being treated as high risk -S/p 5 cycles induction DaraVRd starting 05/15/22 followed by autologous stem cell transplant 10/12/22 by Dr. Rosaria Ferries. Post transplant PET and marrow showed no evidence of disease -she received one cycle of DaraVRd to complete a total of 6 cycles -will continue dara and revlimid maintenance, with daratumumab injection 1800 mg every month for 2 years and Revlimid 10 mg on days 1-21 every 28 days, she started on March 18, 2023. -Will check her multiple myeloma labs and f/u every 2 months, M protein negative and light chain levels normal on 10/2023, she remains in remission.  -She will also follow-up with her transplant team

## 2024-07-21 ENCOUNTER — Inpatient Hospital Stay (HOSPITAL_BASED_OUTPATIENT_CLINIC_OR_DEPARTMENT_OTHER): Admitting: Hematology

## 2024-07-21 ENCOUNTER — Inpatient Hospital Stay

## 2024-07-21 ENCOUNTER — Ambulatory Visit

## 2024-07-21 ENCOUNTER — Inpatient Hospital Stay: Attending: Hematology

## 2024-07-21 ENCOUNTER — Other Ambulatory Visit

## 2024-07-21 VITALS — BP 151/72 | HR 65 | Temp 98.0°F | Resp 18 | Ht 65.0 in | Wt 188.0 lb

## 2024-07-21 DIAGNOSIS — C9000 Multiple myeloma not having achieved remission: Secondary | ICD-10-CM

## 2024-07-21 DIAGNOSIS — Z79620 Long term (current) use of immunosuppressive biologic: Secondary | ICD-10-CM | POA: Diagnosis not present

## 2024-07-21 DIAGNOSIS — C9001 Multiple myeloma in remission: Secondary | ICD-10-CM

## 2024-07-21 DIAGNOSIS — Z5112 Encounter for antineoplastic immunotherapy: Secondary | ICD-10-CM | POA: Insufficient documentation

## 2024-07-21 LAB — CBC WITH DIFFERENTIAL (CANCER CENTER ONLY)
Abs Immature Granulocytes: 0 K/uL (ref 0.00–0.07)
Basophils Absolute: 0 K/uL (ref 0.0–0.1)
Basophils Relative: 1 %
Eosinophils Absolute: 0.1 K/uL (ref 0.0–0.5)
Eosinophils Relative: 2 %
HCT: 37.7 % (ref 36.0–46.0)
Hemoglobin: 12.7 g/dL (ref 12.0–15.0)
Immature Granulocytes: 0 %
Lymphocytes Relative: 53 %
Lymphs Abs: 2.4 K/uL (ref 0.7–4.0)
MCH: 30.6 pg (ref 26.0–34.0)
MCHC: 33.7 g/dL (ref 30.0–36.0)
MCV: 90.8 fL (ref 80.0–100.0)
Monocytes Absolute: 0.6 K/uL (ref 0.1–1.0)
Monocytes Relative: 13 %
Neutro Abs: 1.4 K/uL — ABNORMAL LOW (ref 1.7–7.7)
Neutrophils Relative %: 31 %
Platelet Count: 218 K/uL (ref 150–400)
RBC: 4.15 MIL/uL (ref 3.87–5.11)
RDW: 15.5 % (ref 11.5–15.5)
WBC Count: 4.6 K/uL (ref 4.0–10.5)
nRBC: 0 % (ref 0.0–0.2)

## 2024-07-21 LAB — CMP (CANCER CENTER ONLY)
ALT: 17 U/L (ref 0–44)
AST: 13 U/L — ABNORMAL LOW (ref 15–41)
Albumin: 4.1 g/dL (ref 3.5–5.0)
Alkaline Phosphatase: 116 U/L (ref 38–126)
Anion gap: 4 — ABNORMAL LOW (ref 5–15)
BUN: 11 mg/dL (ref 8–23)
CO2: 30 mmol/L (ref 22–32)
Calcium: 9.4 mg/dL (ref 8.9–10.3)
Chloride: 108 mmol/L (ref 98–111)
Creatinine: 0.69 mg/dL (ref 0.44–1.00)
GFR, Estimated: >60 mL/min (ref >60)
Glucose, Bld: 102 mg/dL — ABNORMAL HIGH (ref 70–99)
Potassium: 4.2 mmol/L (ref 3.5–5.1)
Sodium: 142 mmol/L (ref 135–145)
Total Bilirubin: 0.8 mg/dL (ref 0.0–1.2)
Total Protein: 6.4 g/dL — ABNORMAL LOW (ref 6.5–8.1)

## 2024-07-21 MED ORDER — DARATUMUMAB-HYALURONIDASE-FIHJ 1800-30000 MG-UT/15ML ~~LOC~~ SOLN
1800.0000 mg | Freq: Once | SUBCUTANEOUS | Status: AC
  Administered 2024-07-21: 1800 mg via SUBCUTANEOUS
  Filled 2024-07-21: qty 15

## 2024-07-21 NOTE — Progress Notes (Signed)
 [facility]   Telephone:(336) [phone] Fax:(336) [phone]   Clinic Follow up Note   Patient Care Team: Lanny Callander, MD as PCP - General (Hematology) Cindie Ole DASEN, MD as PCP - Electrophysiology (Cardiology) Jakie Alm SAUNDERS, MD as Consulting Physician (Gastroenterology) Lita Lye, OD as Referring Physician (Optometry) Okey Vina GAILS, MD as Consulting Physician (Cardiology) Murrell Kuba, MD as Consulting Physician (Orthopedic Surgery) Arman Dorn Loving, MD as Referring Physician (Hematology and Oncology) Lanny Callander, MD as Attending Physician (Hematology and Oncology)  Date of Service:  07/21/2024  CHIEF COMPLAINT: f/u of multiple myeloma  CURRENT THERAPY:  Daratumumab , dexamethasone  and Revlimid   Oncology History   Multiple myeloma (HCC)  IgG lambda type, stage II, high risk -Diagnosed with CRAB criteria and bone marrow bx 05/01/2022, pathology confirmed plasma cell myeloma (60% plasma cells). Cytogenics and FISH c/w standard risk multiple myeloma except the deletion of 1P and gain of 1 q. which is a higher risk feature.  She is being treated as high risk -S/p 5 cycles induction DaraVRd starting 05/15/22 followed by autologous stem cell transplant 10/12/22 by Dr. Arman. Post transplant PET and marrow showed no evidence of disease -she received one cycle of DaraVRd to complete a total of 6 cycles -will continue dara and revlimid  maintenance, with daratumumab  injection 1800 mg every month for 2 years and Revlimid  10 mg on days 1-21 every 28 days, she started on March 18, 2023. -Will check her multiple myeloma labs and f/u every 2 months, M protein negative and light chain levels normal on 10/2023, she remains in remission.  -She will also follow-up with her transplant team  Assessment & Plan Multiple myeloma in remission on maintenance therapy Multiple myeloma is in remission with normal light chains, negative SPEP, and unremarkable immunofixation. She is on  maintenance therapy with Revlimid , receiving daratumumab  and Zometa . Daratumumab  treatment will conclude in December 2025 after a two-year course. Zometa  is administered quarterly, with the next dose in November. She reports no issues with Revlimid  and has received flu and COVID vaccines. - Continue Revlimid  maintenance therapy. - Administer Zometa  every three months, next dose in November. - Complete daratumumab  treatment in December 2025. - Schedule follow-up appointments every two months. - Ensure lab work is done during follow-up visits.   Plan - She is clinically doing very well, asymptomatic, and tolerating treatment well. - Lab reviewed, adequate for treatment, will proceed daratumumab  injection today and continue every 4 weeks until the end of May 2026 - Continue Revlimid  at home - Follow-up in 2 months - Next dose of Zometa  in 4 weeks  SUMMARY OF ONCOLOGIC HISTORY: Oncology History Overview Note   Cancer Staging  Multiple myeloma (HCC) Staging form: Plasma Cell Myeloma and Plasma Cell Disorders, AJCC 8th Edition - Clinical stage from 05/01/2022: RISS Stage II (Beta-2 -microglobulin (mg/L): 3.2, Albumin (g/dL): 3.6, ISS: Stage I, High-risk cytogenetics: Absent, LDH: Elevated) - Signed by Lanny Callander, MD on 06/03/2022 Stage prefix: Initial diagnosis Beta 2 microglobulin range (mg/L): Less than 3.5 Albumin range (g/dL): Greater than or equal to 3.5 Cytogenetics: 1p deletion     Multiple myeloma (HCC)  05/01/2022 Cancer Staging   Staging form: Plasma Cell Myeloma and Plasma Cell Disorders, AJCC 8th Edition - Clinical stage from 05/01/2022: RISS Stage II (Beta-2 -microglobulin (mg/L): 3.2, Albumin (g/dL): 3.6, ISS: Stage I, High-risk cytogenetics: Absent, LDH: Elevated) - Signed by Lanny Callander, MD on 06/03/2022 Stage prefix: Initial diagnosis Beta 2 microglobulin range (mg/L): Less than 3.5 Albumin range (g/dL): Greater than or  equal to 3.5 Cytogenetics: 1p deletion   05/08/2022 Initial  Diagnosis   Multiple myeloma (HCC)   05/14/2022 - 02/28/2023 Chemotherapy   Patient is on Treatment Plan : MYELOMA NEWLY DIAGNOSED TRANSPLANT CANDIDATE DaraVRd (Daratumumab  IV) q21d x 6 Cycles (Induction/Consolidation)     05/15/2022 - 05/28/2022 Chemotherapy   Patient is on Treatment Plan : MYELOMA NEWLY DIAGNOSED TRANSPLANT CANDIDATE DaraVRd (Daratumumab  IV) q21d x 6 Cycles (Induction/Consolidation)     03/18/2023 -  Chemotherapy   Patient is on Treatment Plan : MYELOMA Daratumumab  SQ q28d        Discussed the use of AI scribe software for clinical note transcription with the patient, who gave verbal consent to proceed.  History of Present Illness Alyssa Dudley is a 68 year old female with multiple myeloma who presents for follow-up.  She has no new symptoms and denies fever, chills, or cough. Appetite and energy levels are normal.  She is on Revlimid  and receives daratumumab  and Zemaira infusions. Daratumumab  treatment is scheduled to end in December 2025, with continuation of Revlimid  maintenance therapy. Her last lab work in September showed normal light chains and a negative serum protein electrophoresis (SPEP). She receives Zometa  infusions every three months, with the next one scheduled for November.     All other systems were reviewed with the patient and are negative.  MEDICAL HISTORY:  Past Medical History:  Diagnosis Date   Allergy    once a year    Arthritis    Cancer (HCC) 05/08/22   CHF (congestive heart failure) (HCC) 2014   Hyperlipidemia    borderline- no meds    Hypertension 03/2015   Pacemaker    Paroxysmal atrial fibrillation (HCC) 03/2014   detected on ppm interrogation   PONV (postoperative nausea and vomiting)    has vomitted in past   Salivary stone 04/05/2021   Second degree Mobitz II AV block    Vitamin D  deficiency     SURGICAL HISTORY: Past Surgical History:  Procedure Laterality Date   BREAST CYST EXCISION  1980   CESAREAN SECTION  09/1977    COLONOSCOPY     EXTRACORPOREAL SHOCK WAVE LITHOTRIPSY Right 12/28/2019   Procedure: RIGHT EXTRACORPOREAL SHOCK WAVE LITHOTRIPSY (ESWL);  Surgeon: Renda Glance, MD;  Location: [facility];  Service: Urology;  Laterality: Right;   PERMANENT PACEMAKER INSERTION N/A 01/13/2013   SJM Accent DR RF implanted by Dr Kelsie for mobitz II AV block   PLANTAR FASCIECTOMY     POLYPECTOMY     PPM GENERATOR CHANGEOUT N/A 07/08/2023   Procedure: PPM GENERATOR CHANGEOUT;  Surgeon: Cindie Ole DASEN, MD;  Location: [facility] INVASIVE CV LAB;  Service: Cardiovascular;  Laterality: N/A;   WRIST SURGERY Right 2011   removed cyst    I have reviewed the social history and family history with the patient and they are unchanged from previous note.  ALLERGIES:  has no known allergies.  MEDICATIONS:  Current Outpatient Medications  Medication Sig Dispense Refill   amLODipine  (NORVASC ) 5 MG tablet Take 1 tablet (5 mg total) by mouth daily. 90 tablet 1   aspirin  81 MG chewable tablet Chew 81 mg by mouth daily.     Calcium  Carb-Cholecalciferol (CALCIUM  + VITAMIN D3 PO) Take 1 tablet by mouth daily.     ibuprofen  (ADVIL ) 600 MG tablet Take 1 tablet (600 mg total) by mouth every 8 (eight) hours as needed. 30 tablet 0   lenalidomide  (REVLIMID ) 10 MG capsule Take 1 capsule (10 mg total) by  mouth daily. Take 1 capsule (10 mg total) by mouth daily for 21 days. And then 7 days off for a 28 day cycle. [employer] Auth # [id number] expires 07/09/2024 21 capsule 0   Multiple Vitamin (MULTIVITAMIN) tablet Take 1 tablet by mouth daily.     triamcinolone  cream (KENALOG ) 0.1 % APPLY EXTERNALLY TO THE AFFECTED AREA THREE TIMES DAILY AS NEEDED FOR RASH 30 g 2   Vitamin D , Ergocalciferol , (DRISDOL ) 1.25 MG (50000 UNIT) CAPS capsule Take 1 capsule (50,000 Units total) by mouth 2 (two) times a week. 24 capsule 0   Current Facility-Administered Medications  Medication Dose Route Frequency Provider Last Rate Last Admin   0.9 %   sodium chloride  infusion  500 mL Intravenous Once Pyrtle, Gordy HERO, MD       Facility-Administered Medications Ordered in Other Visits  Medication Dose Route Frequency Provider Last Rate Last Admin   daratumumab -hyaluronidase -fihj (DARZALEX  FASPRO) 1800-30000 MG-UT/15ML chemo SQ injection 1,800 mg  1,800 mg Subcutaneous Once Lanny Callander, MD        PHYSICAL EXAMINATION: ECOG PERFORMANCE STATUS: 0 - Asymptomatic  Vitals:   07/21/24 0836 07/21/24 0840  BP: (!) 141/78 (!) 151/72  Pulse: 65   Resp: 18   Temp: 98 F (36.7 C)   SpO2: 100%    Wt Readings from Last 3 Encounters:  07/21/24 188 lb (85.3 kg)  06/23/24 183 lb (83 kg)  05/26/24 186 lb 12.8 oz (84.7 kg)     GENERAL:alert, no distress and comfortable SKIN: skin color, texture, turgor are normal, no rashes or significant lesions EYES: normal, Conjunctiva are pink and non-injected, sclera clear Musculoskeletal:no cyanosis of digits and no clubbing  NEURO: alert & oriented x 3 with fluent speech, no focal motor/sensory deficits  Physical Exam    LABORATORY DATA:  I have reviewed the data as listed    Latest Ref Rng & Units 07/21/2024    8:23 AM 06/23/2024    7:55 AM 05/26/2024    8:12 AM  CBC  WBC 4.0 - 10.5 K/uL 4.6  3.5  3.9   Hemoglobin 12.0 - 15.0 g/dL 87.2  86.7  86.9   Hematocrit 36.0 - 46.0 % 37.7  38.5  37.8   Platelets 150 - 400 K/uL 218  211  228         Latest Ref Rng & Units 07/21/2024    8:23 AM 06/23/2024    7:55 AM 05/26/2024    8:12 AM  CMP  Glucose 70 - 99 mg/dL 897  94  92   BUN 8 - 23 mg/dL 11  10  8    Creatinine 0.44 - 1.00 mg/dL 9.30  9.36  9.34   Sodium 135 - 145 mmol/L 142  142  141   Potassium 3.5 - 5.1 mmol/L 4.2  3.8  4.0   Chloride 98 - 111 mmol/L 108  107  107   CO2 22 - 32 mmol/L 30  28  28    Calcium  8.9 - 10.3 mg/dL 9.4  9.6  8.7   Total Protein 6.5 - 8.1 g/dL 6.4  6.8  6.7   Total Bilirubin 0.0 - 1.2 mg/dL 0.8  0.7  0.7   Alkaline Phos 38 - 126 U/L 116  127  127   AST 15 - 41 U/L  13  15  15    ALT 0 - 44 U/L 17  16  17        RADIOGRAPHIC STUDIES: I have personally reviewed the radiological images as listed  and agreed with the findings in the report. No results found.    No orders of the defined types were placed in this encounter.  All questions were answered. The patient knows to call the clinic with any problems, questions or concerns. No barriers to learning was detected. The total time spent in the appointment was 25 minutes, including review of chart and various tests results, discussions about plan of care and coordination of care plan     Onita Mattock, MD 07/21/2024

## 2024-07-21 NOTE — Progress Notes (Signed)
 Pt reports taking pre-meds at home prior to arrival for infusion today

## 2024-08-07 ENCOUNTER — Other Ambulatory Visit: Payer: Self-pay

## 2024-08-07 DIAGNOSIS — C9001 Multiple myeloma in remission: Secondary | ICD-10-CM

## 2024-08-07 MED ORDER — LENALIDOMIDE 10 MG PO CAPS: 10.0000 mg | ORAL_CAPSULE | Freq: Every day | ORAL | 0 refills | Status: DC

## 2024-08-10 ENCOUNTER — Ambulatory Visit: Payer: BC Managed Care – PPO

## 2024-08-10 ENCOUNTER — Other Ambulatory Visit: Payer: Self-pay | Admitting: Family

## 2024-08-10 ENCOUNTER — Encounter: Payer: Self-pay | Admitting: Radiology

## 2024-08-10 DIAGNOSIS — E559 Vitamin D deficiency, unspecified: Secondary | ICD-10-CM

## 2024-08-17 NOTE — Assessment & Plan Note (Signed)
 IgG lambda type, stage II, high risk -Diagnosed with CRAB criteria and bone marrow bx 05/01/2022, pathology confirmed plasma cell myeloma (60% plasma cells). Cytogenics and FISH c/w standard risk multiple myeloma except the deletion of 1P and gain of 1 q. which is a higher risk feature.  She is being treated as high risk -S/p 5 cycles induction DaraVRd starting 05/15/22 followed by autologous stem cell transplant 10/12/22 by Dr. Arman. Post transplant PET and marrow showed no evidence of disease -she received one cycle of DaraVRd to complete a total of 6 cycles -will continue dara and revlimid  maintenance, with daratumumab  injection 1800 mg every month for 2 years and Revlimid  10 mg on days 1-21 every 28 days, she started on March 18, 2023. -Will check her multiple myeloma labs and f/u every 2 months, M protein negative and light chain levels normal on 06/2024, she remains in remission.  -She will also follow-up with her transplant team

## 2024-08-18 ENCOUNTER — Inpatient Hospital Stay

## 2024-08-18 ENCOUNTER — Inpatient Hospital Stay: Admitting: Hematology

## 2024-08-18 ENCOUNTER — Inpatient Hospital Stay: Attending: Hematology

## 2024-08-18 VITALS — BP 136/68 | HR 77 | Temp 97.3°F | Resp 17 | Ht 65.0 in | Wt 189.5 lb

## 2024-08-18 DIAGNOSIS — C9001 Multiple myeloma in remission: Secondary | ICD-10-CM | POA: Diagnosis not present

## 2024-08-18 DIAGNOSIS — Z79620 Long term (current) use of immunosuppressive biologic: Secondary | ICD-10-CM | POA: Insufficient documentation

## 2024-08-18 DIAGNOSIS — C9000 Multiple myeloma not having achieved remission: Secondary | ICD-10-CM

## 2024-08-18 DIAGNOSIS — Z5112 Encounter for antineoplastic immunotherapy: Secondary | ICD-10-CM | POA: Insufficient documentation

## 2024-08-18 LAB — CMP (CANCER CENTER ONLY)
ALT: 16 U/L (ref 0–44)
AST: 14 U/L — ABNORMAL LOW (ref 15–41)
Albumin: 4.2 g/dL (ref 3.5–5.0)
Alkaline Phosphatase: 106 U/L (ref 38–126)
Anion gap: 4 — ABNORMAL LOW (ref 5–15)
BUN: 8 mg/dL (ref 8–23)
CO2: 30 mmol/L (ref 22–32)
Calcium: 9.2 mg/dL (ref 8.9–10.3)
Chloride: 109 mmol/L (ref 98–111)
Creatinine: 0.68 mg/dL (ref 0.44–1.00)
GFR, Estimated: >60 mL/min (ref >60)
Glucose, Bld: 92 mg/dL (ref 70–99)
Potassium: 4.1 mmol/L (ref 3.5–5.1)
Sodium: 143 mmol/L (ref 135–145)
Total Bilirubin: 0.7 mg/dL (ref 0.0–1.2)
Total Protein: 6.6 g/dL (ref 6.5–8.1)

## 2024-08-18 LAB — CBC WITH DIFFERENTIAL (CANCER CENTER ONLY)
Abs Immature Granulocytes: 0 K/uL (ref 0.00–0.07)
Basophils Absolute: 0 K/uL (ref 0.0–0.1)
Basophils Relative: 1 %
Eosinophils Absolute: 0.1 K/uL (ref 0.0–0.5)
Eosinophils Relative: 3 %
HCT: 36.9 % (ref 36.0–46.0)
Hemoglobin: 12.8 g/dL (ref 12.0–15.0)
Immature Granulocytes: 0 %
Lymphocytes Relative: 51 %
Lymphs Abs: 2 K/uL (ref 0.7–4.0)
MCH: 31.4 pg (ref 26.0–34.0)
MCHC: 34.7 g/dL (ref 30.0–36.0)
MCV: 90.4 fL (ref 80.0–100.0)
Monocytes Absolute: 0.6 K/uL (ref 0.1–1.0)
Monocytes Relative: 14 %
Neutro Abs: 1.2 K/uL — ABNORMAL LOW (ref 1.7–7.7)
Neutrophils Relative %: 31 %
Platelet Count: 219 K/uL (ref 150–400)
RBC: 4.08 MIL/uL (ref 3.87–5.11)
RDW: 15.6 % — ABNORMAL HIGH (ref 11.5–15.5)
WBC Count: 3.8 K/uL — ABNORMAL LOW (ref 4.0–10.5)
nRBC: 0 % (ref 0.0–0.2)

## 2024-08-18 MED ORDER — SODIUM CHLORIDE 0.9 % IV SOLN: INTRAVENOUS | Status: DC

## 2024-08-18 MED ORDER — ZOLEDRONIC ACID 4 MG/100ML IV SOLN
4.0000 mg | Freq: Once | INTRAVENOUS | Status: AC
  Administered 2024-08-18: 4 mg via INTRAVENOUS
  Filled 2024-08-18: qty 100

## 2024-08-18 MED ORDER — DARATUMUMAB-HYALURONIDASE-FIHJ 1800-30000 MG-UT/15ML ~~LOC~~ SOLN
1800.0000 mg | Freq: Once | SUBCUTANEOUS | Status: AC
  Administered 2024-08-18: 1800 mg via SUBCUTANEOUS
  Filled 2024-08-18: qty 15

## 2024-08-18 NOTE — Patient Instructions (Signed)
 [facility] - A DEPT OF [facility]  Discharge Instructions: Thank you for choosing [facility] to provide your oncology and hematology care.   If you have a lab appointment with the Cancer Center, please go directly to the Cancer Center and check in at the registration area.   Wear comfortable clothing and clothing appropriate for easy access to any Portacath or PICC line.   We strive to give you quality time with your provider. You may need to reschedule your appointment if you arrive late (15 or more minutes).  Arriving late affects you and other patients whose appointments are after yours.  Also, if you miss three or more appointments without notifying the office, you may be dismissed from the clinic at the provider's discretion.      For prescription refill requests, have your pharmacy contact our office and allow 72 hours for refills to be completed.    Today you received the following chemotherapy and/or immunotherapy agents: daratumumab-hyaluronidase-fihj (DARZALEX FASPRO       To help prevent nausea and vomiting after your treatment, we encourage you to take your nausea medication as directed.  BELOW ARE SYMPTOMS THAT SHOULD BE REPORTED IMMEDIATELY: *FEVER GREATER THAN 100.4 F (38 C) OR HIGHER *CHILLS OR SWEATING *NAUSEA AND VOMITING THAT IS NOT CONTROLLED WITH YOUR NAUSEA MEDICATION *UNUSUAL SHORTNESS OF BREATH *UNUSUAL BRUISING OR BLEEDING *URINARY PROBLEMS (pain or burning when urinating, or frequent urination) *BOWEL PROBLEMS (unusual diarrhea, constipation, pain near the anus) TENDERNESS IN MOUTH AND THROAT WITH OR WITHOUT PRESENCE OF ULCERS (sore throat, sores in mouth, or a toothache) UNUSUAL RASH, SWELLING OR PAIN  UNUSUAL VAGINAL DISCHARGE OR ITCHING   Items with * indicate a potential emergency and should be followed up as soon as possible or go to the Emergency Department if any problems should occur.  Please show the  CHEMOTHERAPY ALERT CARD or IMMUNOTHERAPY ALERT CARD at check-in to the Emergency Department and triage nurse.  Should you have questions after your visit or need to cancel or reschedule your appointment, please contact [facility] - A DEPT OF [facility]  Dept: [phone]  and follow the prompts.  Office hours are 8:00 a.m. to 4:30 p.m. Monday - Friday. Please note that voicemails left after 4:00 p.m. may not be returned until the following business day.  We are closed weekends and major holidays. You have access to a nurse at all times for urgent questions. Please call the main number to the clinic Dept: [phone] and follow the prompts.   For any non-urgent questions, you may also contact your provider using MyChart. We now offer e-Visits for anyone 63 and older to request care online for non-urgent symptoms. For details visit [email or web address].   Also download the MyChart app! Go to the app store, search "MyChart", open the app, select [facility], and log in with your MyChart username and password.

## 2024-08-18 NOTE — Progress Notes (Signed)
 [facility]   [phone] [phone]   Clinic Follow up Note   Patient Care Team: Lucius Krabbe, NP as PCP - General (Family Medicine) Cindie Ole DASEN, MD as PCP - Electrophysiology (Cardiology) Jakie Alm SAUNDERS, MD as Consulting Physician (Gastroenterology) Lita Lye, OD as Referring Physician (Optometry) Okey Vina GAILS, MD as Consulting Physician (Cardiology) Murrell Kuba, MD as Consulting Physician (Orthopedic Surgery) Arman Dorn Loving, MD as Referring Physician (Hematology and Oncology) Lanny Callander, MD as Attending Physician (Hematology and Oncology)  Date of Service:  08/18/2024  CHIEF COMPLAINT: f/u of multiple myeloma  CURRENT THERAPY:  Daratumumab  injection every months Revlimid  10 mg daily for 21 days, then off for 7 days Zometa  every 3 months  Oncology History   Multiple myeloma (HCC)  IgG lambda type, stage II, high risk -Diagnosed with CRAB criteria and bone marrow bx 05/01/2022, pathology confirmed plasma cell myeloma (60% plasma cells). Cytogenics and FISH c/w standard risk multiple myeloma except the deletion of 1P and gain of 1 q. which is a higher risk feature.  She is being treated as high risk -S/p 5 cycles induction DaraVRd starting 05/15/22 followed by autologous stem cell transplant 10/12/22 by Dr. Arman. Post transplant PET and marrow showed no evidence of disease -she received one cycle of DaraVRd to complete a total of 6 cycles -will continue dara and revlimid  maintenance, with daratumumab  injection 1800 mg every month for 2 years and Revlimid  10 mg on days 1-21 every 28 days, she started on March 18, 2023. -Will check her multiple myeloma labs and f/u every 2 months, M protein negative and light chain levels normal on 06/2024, she remains in remission.  -She will also follow-up with her transplant team  Assessment & Plan Multiple myeloma in remission Multiple myeloma remains in remission with no detectable  disease. Last lab results from September showed normal light chain levels. No anemia or thrombocytopenia. White blood cell count slightly low but not concerning. She is on a maintenance regimen with Zometa  and Daira, which she tolerates well despite experiencing aches and pains post-infusion. She is aware of the need to avoid major dental procedures and has recently had a dental cleaning. - Administered Zometa  infusion today. - Scheduled next Zometa  infusion for January 6th. - Continue Dara injections as scheduled. - Ensure no major dental procedures are scheduled in the next month. - Continue vitamin D  supplementation as prescribed by primary care physician. - Continue calcium  supplementation over the counter.  Constipation Recent episode of constipation, which is atypical for her. She has been using Doclix with some relief. Discussed alternative options such as Miralax, probiotics, fiber, and increased water intake. - Recommended trying Miralax for faster relief. - Encouraged use of probiotics, fiber-rich foods, and increased water intake.  Plan - She is clinically doing very well, asymptomatic. - Lab reviewed, mild neutropenia, will proceed with her injection and Zometa  infusion today - She will follow-up with Dr. Arman in 2 months, I will see her back in 4 months.   SUMMARY OF ONCOLOGIC HISTORY: Oncology History Overview Note   Cancer Staging  Multiple myeloma (HCC) Staging form: Plasma Cell Myeloma and Plasma Cell Disorders, AJCC 8th Edition - Clinical stage from 05/01/2022: RISS Stage II (Beta-2 -microglobulin (mg/L): 3.2, Albumin (g/dL): 3.6, ISS: Stage I, High-risk cytogenetics: Absent, LDH: Elevated) - Signed by Lanny Callander, MD on 06/03/2022 Stage prefix: Initial diagnosis Beta 2 microglobulin range (mg/L): Less than 3.5 Albumin range (g/dL): Greater than or equal to 3.5 Cytogenetics: 1p deletion  Multiple myeloma (HCC)  05/01/2022 Cancer Staging   Staging form: Plasma Cell  Myeloma and Plasma Cell Disorders, AJCC 8th Edition - Clinical stage from 05/01/2022: RISS Stage II (Beta-2 -microglobulin (mg/L): 3.2, Albumin (g/dL): 3.6, ISS: Stage I, High-risk cytogenetics: Absent, LDH: Elevated) - Signed by Lanny Callander, MD on 06/03/2022 Stage prefix: Initial diagnosis Beta 2 microglobulin range (mg/L): Less than 3.5 Albumin range (g/dL): Greater than or equal to 3.5 Cytogenetics: 1p deletion   05/08/2022 Initial Diagnosis   Multiple myeloma (HCC)   05/14/2022 - 02/28/2023 Chemotherapy   Patient is on Treatment Plan : MYELOMA NEWLY DIAGNOSED TRANSPLANT CANDIDATE DaraVRd (Daratumumab  IV) q21d x 6 Cycles (Induction/Consolidation)     05/15/2022 - 05/28/2022 Chemotherapy   Patient is on Treatment Plan : MYELOMA NEWLY DIAGNOSED TRANSPLANT CANDIDATE DaraVRd (Daratumumab  IV) q21d x 6 Cycles (Induction/Consolidation)     03/18/2023 -  Chemotherapy   Patient is on Treatment Plan : MYELOMA Daratumumab  SQ q28d        Discussed the use of AI scribe software for clinical note transcription with the patient, who gave verbal consent to proceed.  History of Present Illness Alyssa Dudley is a 68 year old female with multiple myeloma who presents for follow-up.  Her last lab results from September showed normal light chain levels. After her first infusion, she experienced aches and pains lasting about two days. She takes vitamin D  twice a week as prescribed and uses over-the-counter calcium  and another vitamin. She has received both flu and COVID vaccinations and continues to wear a mask in public settings.     All other systems were reviewed with the patient and are negative.  MEDICAL HISTORY:  Past Medical History:  Diagnosis Date   Allergy    once a year    Arthritis    Cancer (HCC) 05/08/22   CHF (congestive heart failure) (HCC) 2014   Hyperlipidemia    borderline- no meds    Hypertension 03/2015   Pacemaker    Paroxysmal atrial fibrillation (HCC) 03/2014   detected on ppm  interrogation   PONV (postoperative nausea and vomiting)    has vomitted in past   Salivary stone 04/05/2021   Second degree Mobitz II AV block    Vitamin D  deficiency     SURGICAL HISTORY: Past Surgical History:  Procedure Laterality Date   BREAST CYST EXCISION  1980   CESAREAN SECTION  09/1977   COLONOSCOPY     EXTRACORPOREAL SHOCK WAVE LITHOTRIPSY Right 12/28/2019   Procedure: RIGHT EXTRACORPOREAL SHOCK WAVE LITHOTRIPSY (ESWL);  Surgeon: Renda Glance, MD;  Location: [facility];  Service: Urology;  Laterality: Right;   PERMANENT PACEMAKER INSERTION N/A 01/13/2013   SJM Accent DR RF implanted by Dr Kelsie for mobitz II AV block   PLANTAR FASCIECTOMY     POLYPECTOMY     PPM GENERATOR CHANGEOUT N/A 07/08/2023   Procedure: PPM GENERATOR CHANGEOUT;  Surgeon: Cindie Ole DASEN, MD;  Location: [facility] INVASIVE CV LAB;  Service: Cardiovascular;  Laterality: N/A;   WRIST SURGERY Right 2011   removed cyst    I have reviewed the social history and family history with the patient and they are unchanged from previous note.  ALLERGIES:  has no known allergies.  MEDICATIONS:  Current Outpatient Medications  Medication Sig Dispense Refill   amLODipine  (NORVASC ) 5 MG tablet Take 1 tablet (5 mg total) by mouth daily. 90 tablet 1   aspirin  81 MG chewable tablet Chew 81 mg by mouth daily.     Calcium   Carb-Cholecalciferol (CALCIUM  + VITAMIN D3 PO) Take 1 tablet by mouth daily.     ibuprofen  (ADVIL ) 600 MG tablet Take 1 tablet (600 mg total) by mouth every 8 (eight) hours as needed. 30 tablet 0   lenalidomide  (REVLIMID ) 10 MG capsule Take 1 capsule (10 mg total) by mouth daily. Take 1 capsule (10 mg total) by mouth daily for 21 days. And then 7 days off for a 28 day cycle. [employer] Auth # [id number] expires 08/07/2024 21 capsule 0   Multiple Vitamin (MULTIVITAMIN) tablet Take 1 tablet by mouth daily.     triamcinolone  cream (KENALOG ) 0.1 % APPLY EXTERNALLY TO THE AFFECTED AREA THREE  TIMES DAILY AS NEEDED FOR RASH 30 g 2   Vitamin D , Ergocalciferol , (DRISDOL ) 1.25 MG (50000 UNIT) CAPS capsule TAKE 1 CAPSULE BY MOUTH 2 TIMES A WEEK 24 capsule 0   Current Facility-Administered Medications  Medication Dose Route Frequency Provider Last Rate Last Admin   0.9 %  sodium chloride  infusion  500 mL Intravenous Once Pyrtle, Gordy HERO, MD       Facility-Administered Medications Ordered in Other Visits  Medication Dose Route Frequency Provider Last Rate Last Admin   0.9 %  sodium chloride  infusion   Intravenous Continuous Lanny Callander, MD 10 mL/hr at 08/18/24 0959 New Bag at 08/18/24 0959   daratumumab -hyaluronidase -fihj (DARZALEX  FASPRO) 1800-30000 MG-UT/15ML chemo SQ injection 1,800 mg  1,800 mg Subcutaneous Once Lanny Callander, MD       Zoledronic  Acid (ZOMETA ) IVPB 4 mg  4 mg Intravenous Once Lanny Callander, MD        PHYSICAL EXAMINATION: ECOG PERFORMANCE STATUS: 0 - Asymptomatic  Vitals:   08/18/24 0910  BP: 136/68  Pulse: 77  Resp: 17  Temp: (!) 97.3 F (36.3 C)  SpO2: 99%   Wt Readings from Last 3 Encounters:  08/18/24 189 lb 8 oz (86 kg)  07/21/24 188 lb (85.3 kg)  06/23/24 183 lb (83 kg)     GENERAL:alert, no distress and comfortable SKIN: skin color, texture, turgor are normal, no rashes or significant lesions EYES: normal, Conjunctiva are pink and non-injected, sclera clear Musculoskeletal:no cyanosis of digits and no clubbing  NEURO: alert & oriented x 3 with fluent speech, no focal motor/sensory deficits  Physical Exam    LABORATORY DATA:  I have reviewed the data as listed    Latest Ref Rng & Units 08/18/2024    8:40 AM 07/21/2024    8:23 AM 06/23/2024    7:55 AM  CBC  WBC 4.0 - 10.5 K/uL 3.8  4.6  3.5   Hemoglobin 12.0 - 15.0 g/dL 87.1  87.2  86.7   Hematocrit 36.0 - 46.0 % 36.9  37.7  38.5   Platelets 150 - 400 K/uL 219  218  211         Latest Ref Rng & Units 08/18/2024    8:40 AM 07/21/2024    8:23 AM 06/23/2024    7:55 AM  CMP  Glucose 70 - 99  mg/dL 92  897  94   BUN 8 - 23 mg/dL 8  11  10    Creatinine 0.44 - 1.00 mg/dL 9.31  9.30  9.36   Sodium 135 - 145 mmol/L 143  142  142   Potassium 3.5 - 5.1 mmol/L 4.1  4.2  3.8   Chloride 98 - 111 mmol/L 109  108  107   CO2 22 - 32 mmol/L 30  30  28    Calcium  8.9 - 10.3 mg/dL  9.2  9.4  9.6   Total Protein 6.5 - 8.1 g/dL 6.6  6.4  6.8   Total Bilirubin 0.0 - 1.2 mg/dL 0.7  0.8  0.7   Alkaline Phos 38 - 126 U/L 106  116  127   AST 15 - 41 U/L 14  13  15    ALT 0 - 44 U/L 16  17  16        RADIOGRAPHIC STUDIES: I have personally reviewed the radiological images as listed and agreed with the findings in the report. No results found.    Orders Placed This Encounter  Procedures   CBC with Differential (Cancer Center Only)    Standing Status:   Future    Expected Date:   11/10/2024    Expiration Date:   11/10/2025   CBC with Differential (Cancer Center Only)    Standing Status:   Future    Expected Date:   12/08/2024    Expiration Date:   12/08/2025   All questions were answered. The patient knows to call the clinic with any problems, questions or concerns. No barriers to learning was detected. The total time spent in the appointment was 25 minutes, including review of chart and various tests results, discussions about plan of care and coordination of care plan     Onita Mattock, MD 08/18/2024

## 2024-08-19 LAB — KAPPA/LAMBDA LIGHT CHAINS
Kappa free light chain: 7.1 mg/L (ref 3.3–19.4)
Kappa, lambda light chain ratio: 0.99 (ref 0.26–1.65)
Lambda free light chains: 7.2 mg/L (ref 5.7–26.3)

## 2024-08-20 LAB — PROTEIN ELECTROPHORESIS, SERUM
A/G Ratio: 1.1 (ref 0.7–1.7)
Albumin ELP: 3.3 g/dL (ref 2.9–4.4)
Alpha-1-Globulin: 0.3 g/dL (ref 0.0–0.4)
Alpha-2-Globulin: 0.9 g/dL (ref 0.4–1.0)
Beta Globulin: 1 g/dL (ref 0.7–1.3)
Gamma Globulin: 0.7 g/dL (ref 0.4–1.8)
Globulin, Total: 2.9 g/dL (ref 2.2–3.9)
Total Protein ELP: 6.2 g/dL (ref 6.0–8.5)

## 2024-08-27 LAB — IFE, DARA-SPECIFIC, SERUM
IgA: 20 mg/dL — ABNORMAL LOW (ref 87–352)
IgG (Immunoglobin G), Serum: 741 mg/dL (ref 586–1602)
IgM (Immunoglobulin M), Srm: 22 mg/dL — ABNORMAL LOW (ref 26–217)

## 2024-09-07 ENCOUNTER — Other Ambulatory Visit: Payer: Self-pay

## 2024-09-07 ENCOUNTER — Other Ambulatory Visit: Payer: Self-pay | Admitting: Hematology

## 2024-09-07 DIAGNOSIS — C9001 Multiple myeloma in remission: Secondary | ICD-10-CM

## 2024-09-07 MED ORDER — LENALIDOMIDE 10 MG PO CAPS: 10.0000 mg | ORAL_CAPSULE | Freq: Every day | ORAL | 0 refills | Status: DC

## 2024-09-15 ENCOUNTER — Inpatient Hospital Stay

## 2024-09-15 ENCOUNTER — Inpatient Hospital Stay: Admitting: Hematology

## 2024-09-15 ENCOUNTER — Encounter: Payer: Self-pay | Admitting: Hematology

## 2024-09-15 ENCOUNTER — Inpatient Hospital Stay: Attending: Hematology

## 2024-09-15 VITALS — BP 141/66 | HR 63 | Temp 98.6°F | Resp 18 | Wt 191.1 lb

## 2024-09-15 DIAGNOSIS — C9001 Multiple myeloma in remission: Secondary | ICD-10-CM | POA: Insufficient documentation

## 2024-09-15 DIAGNOSIS — Z79620 Long term (current) use of immunosuppressive biologic: Secondary | ICD-10-CM | POA: Diagnosis not present

## 2024-09-15 DIAGNOSIS — Z5112 Encounter for antineoplastic immunotherapy: Secondary | ICD-10-CM | POA: Insufficient documentation

## 2024-09-15 DIAGNOSIS — C9000 Multiple myeloma not having achieved remission: Secondary | ICD-10-CM

## 2024-09-15 LAB — CBC WITH DIFFERENTIAL (CANCER CENTER ONLY)
Abs Immature Granulocytes: 0 K/uL (ref 0.00–0.07)
Basophils Absolute: 0 K/uL (ref 0.0–0.1)
Basophils Relative: 1 %
Eosinophils Absolute: 0.1 K/uL (ref 0.0–0.5)
Eosinophils Relative: 2 %
HCT: 37.8 % (ref 36.0–46.0)
Hemoglobin: 12.9 g/dL (ref 12.0–15.0)
Immature Granulocytes: 0 %
Lymphocytes Relative: 55 %
Lymphs Abs: 2.7 K/uL (ref 0.7–4.0)
MCH: 30.9 pg (ref 26.0–34.0)
MCHC: 34.1 g/dL (ref 30.0–36.0)
MCV: 90.6 fL (ref 80.0–100.0)
Monocytes Absolute: 0.6 K/uL (ref 0.1–1.0)
Monocytes Relative: 13 %
Neutro Abs: 1.4 K/uL — ABNORMAL LOW (ref 1.7–7.7)
Neutrophils Relative %: 29 %
Platelet Count: 223 K/uL (ref 150–400)
RBC: 4.17 MIL/uL (ref 3.87–5.11)
RDW: 15.7 % — ABNORMAL HIGH (ref 11.5–15.5)
WBC Count: 4.9 K/uL (ref 4.0–10.5)
nRBC: 0 % (ref 0.0–0.2)

## 2024-09-15 LAB — CMP (CANCER CENTER ONLY)
ALT: 25 U/L (ref 0–44)
AST: 22 U/L (ref 15–41)
Albumin: 4.2 g/dL (ref 3.5–5.0)
Alkaline Phosphatase: 114 U/L (ref 38–126)
Anion gap: 8 (ref 5–15)
BUN: 9 mg/dL (ref 8–23)
CO2: 28 mmol/L (ref 22–32)
Calcium: 9.3 mg/dL (ref 8.9–10.3)
Chloride: 107 mmol/L (ref 98–111)
Creatinine: 0.69 mg/dL (ref 0.44–1.00)
GFR, Estimated: >60 mL/min (ref >60)
Glucose, Bld: 93 mg/dL (ref 70–99)
Potassium: 4.5 mmol/L (ref 3.5–5.1)
Sodium: 143 mmol/L (ref 135–145)
Total Bilirubin: 0.7 mg/dL (ref 0.0–1.2)
Total Protein: 6.5 g/dL (ref 6.5–8.1)

## 2024-09-15 MED ORDER — DARATUMUMAB-HYALURONIDASE-FIHJ 1800-30000 MG-UT/15ML ~~LOC~~ SOLN
1800.0000 mg | Freq: Once | SUBCUTANEOUS | Status: AC
  Administered 2024-09-15: 1800 mg via SUBCUTANEOUS
  Filled 2024-09-15: qty 15

## 2024-09-15 NOTE — Patient Instructions (Signed)
 [facility] - A DEPT OF [facility]  Discharge Instructions: Thank you for choosing [facility] to provide your oncology and hematology care.   If you have a lab appointment with the Cancer Center, please go directly to the Cancer Center and check in at the registration area.   Wear comfortable clothing and clothing appropriate for easy access to any Portacath or PICC line.   We strive to give you quality time with your provider. You may need to reschedule your appointment if you arrive late (15 or more minutes).  Arriving late affects you and other patients whose appointments are after yours.  Also, if you miss three or more appointments without notifying the office, you may be dismissed from the clinic at the provider's discretion.      For prescription refill requests, have your pharmacy contact our office and allow 72 hours for refills to be completed.    Today you received the following chemotherapy and/or immunotherapy agents: daratumumab-hyaluronidase-fihj (DARZALEX FASPRO       To help prevent nausea and vomiting after your treatment, we encourage you to take your nausea medication as directed.  BELOW ARE SYMPTOMS THAT SHOULD BE REPORTED IMMEDIATELY: *FEVER GREATER THAN 100.4 F (38 C) OR HIGHER *CHILLS OR SWEATING *NAUSEA AND VOMITING THAT IS NOT CONTROLLED WITH YOUR NAUSEA MEDICATION *UNUSUAL SHORTNESS OF BREATH *UNUSUAL BRUISING OR BLEEDING *URINARY PROBLEMS (pain or burning when urinating, or frequent urination) *BOWEL PROBLEMS (unusual diarrhea, constipation, pain near the anus) TENDERNESS IN MOUTH AND THROAT WITH OR WITHOUT PRESENCE OF ULCERS (sore throat, sores in mouth, or a toothache) UNUSUAL RASH, SWELLING OR PAIN  UNUSUAL VAGINAL DISCHARGE OR ITCHING   Items with * indicate a potential emergency and should be followed up as soon as possible or go to the Emergency Department if any problems should occur.  Please show the  CHEMOTHERAPY ALERT CARD or IMMUNOTHERAPY ALERT CARD at check-in to the Emergency Department and triage nurse.  Should you have questions after your visit or need to cancel or reschedule your appointment, please contact [facility] - A DEPT OF [facility]  Dept: [phone]  and follow the prompts.  Office hours are 8:00 a.m. to 4:30 p.m. Monday - Friday. Please note that voicemails left after 4:00 p.m. may not be returned until the following business day.  We are closed weekends and major holidays. You have access to a nurse at all times for urgent questions. Please call the main number to the clinic Dept: [phone] and follow the prompts.   For any non-urgent questions, you may also contact your provider using MyChart. We now offer e-Visits for anyone 63 and older to request care online for non-urgent symptoms. For details visit [email or web address].   Also download the MyChart app! Go to the app store, search "MyChart", open the app, select [facility], and log in with your MyChart username and password.

## 2024-09-23 ENCOUNTER — Encounter (HOSPITAL_COMMUNITY): Payer: Self-pay

## 2024-09-23 ENCOUNTER — Ambulatory Visit (INDEPENDENT_AMBULATORY_CARE_PROVIDER_SITE_OTHER)

## 2024-09-23 ENCOUNTER — Inpatient Hospital Stay (HOSPITAL_COMMUNITY): Admission: RE | Admit: 2024-09-23 | Discharge: 2024-09-23 | Payer: Self-pay | Attending: Family Medicine

## 2024-09-23 VITALS — BP 149/78 | HR 71 | Temp 98.0°F | Resp 17

## 2024-09-23 DIAGNOSIS — R051 Acute cough: Secondary | ICD-10-CM | POA: Diagnosis not present

## 2024-09-23 MED ORDER — HYDROCODONE BIT-HOMATROP MBR 5-1.5 MG/5ML PO SOLN: 5.0000 mL | Freq: Four times a day (QID) | ORAL | 0 refills | Status: AC | PRN

## 2024-09-23 NOTE — ED Provider Notes (Signed)
 [facility]   [id number] 09/23/24 Arrival Time: 1020  ASSESSMENT & PLAN:  1. Acute cough    I have personally viewed and independently interpreted the imaging studies ordered this visit. CXR: no acute changes; pacemaker present.  Discussed typical duration of likely viral illness. OTC symptom care as needed. As needed: Meds ordered this encounter  Medications   HYDROcodone  bit-homatropine (HYCODAN) 5-1.5 MG/5ML syrup    Sig: Take 5 mLs by mouth every 6 (six) hours as needed for cough.    Dispense:  90 mL    Refill:  0     Follow-up Information     Lucius Krabbe, NP.   Specialty: Family Medicine Why: As needed. Contact information: [address] [place] [address] [phone]                 Reviewed expectations re: course of current medical issues. Questions answered. Outlined signs and symptoms indicating need for more acute intervention. Understanding verbalized. After Visit Summary given.   SUBJECTIVE: History from: Patient. Alyssa Dudley is a 68 y.o. female. Pt has c/o a cough and congestion  that started Sunday and hasn't stopped since then. Has been taken mucinex with no effect. Last dose of mucinex 4pm  yesterday. Want to make sure I don't have pneumonia since I've had cancer. Denies: fever and difficulty breathing. Normal PO intake without n/v/d.  OBJECTIVE:  Vitals:   09/23/24 1035  BP: (!) 149/78  Pulse: 71  Resp: 17  Temp: 98 F (36.7 C)  TempSrc: Oral  SpO2: 96%    General appearance: alert; no distress Eyes: PERRLA; EOMI; conjunctiva normal HENT: [place]; AT; with nasal congestion Neck: supple  Lungs: speaks full sentences without difficulty; unlabored; coarse breath sounds bilaterally Extremities: no edema Skin: warm and dry Neurologic: normal gait Psychological: alert and cooperative; normal mood and affect  Labs:  Labs Reviewed - No data to display  Imaging: DG Chest 2 View Result Date:  09/23/2024 CLINICAL DATA:  Cough EXAM: CHEST - 2 VIEW COMPARISON:  September 07, 2023 FINDINGS: The heart size and mediastinal contours are within normal limits. Left-sided pacemaker is unchanged. Both lungs are clear. The visualized skeletal structures are unremarkable. IMPRESSION: No active cardiopulmonary disease. Electronically Signed   By: Lynwood Landy Raddle M.D.   On: 09/23/2024 11:43    Allergies[1]  Past Medical History:  Diagnosis Date   Allergy    once a year    Arthritis    Cancer (HCC) 05/08/22   CHF (congestive heart failure) (HCC) 2014   Hyperlipidemia    borderline- no meds    Hypertension 03/2015   Pacemaker    Paroxysmal atrial fibrillation (HCC) 03/2014   detected on ppm interrogation   PONV (postoperative nausea and vomiting)    has vomitted in past   Salivary stone 04/05/2021   Second degree Mobitz II AV block    Vitamin D  deficiency    Social History   Socioeconomic History   Marital status: Single    Spouse name: Not on file   Number of children: 1   Years of education: Not on file   Highest education level: Bachelor's degree (e.g., BA, AB, BS)  Occupational History   Not on file  Tobacco Use   Smoking status: Never   Smokeless tobacco: Never  Vaping Use   Vaping status: Never Used  Substance and Sexual Activity   Alcohol use: No   Drug use: No   Sexual activity: Not Currently  Other Topics  Concern   Not on file  Social History Narrative   Not on file   Social Drivers of Health   Tobacco Use: Low Risk (05/20/2024)   Patient History    Smoking Tobacco Use: Never    Smokeless Tobacco Use: Never    Passive Exposure: Not on file  Financial Resource Strain: Low Risk (04/16/2024)   Overall Financial Resource Strain (CARDIA)    Difficulty of Paying Living Expenses: Not hard at all  Food Insecurity: No Food Insecurity (04/21/2024)   Epic    Worried About Programme Researcher, Broadcasting/film/video in the Last Year: Never true    Ran Out of Food in the Last Year: Never true   Transportation Needs: No Transportation Needs (04/21/2024)   Epic    Lack of Transportation (Medical): No    Lack of Transportation (Non-Medical): No  Physical Activity: Insufficiently Active (04/16/2024)   Exercise Vital Sign    Days of Exercise per Week: 2 days    Minutes of Exercise per Session: 30 min  Stress: No Stress Concern Present (04/16/2024)   [employer] of Occupational Health - Occupational Stress Questionnaire    Feeling of Stress: Only a little  Social Connections: Unknown (04/17/2024)   Social Connection and Isolation Panel    Frequency of Communication with Friends and Family: More than three times a week    Frequency of Social Gatherings with Friends and Family: More than three times a week    Attends Religious Services: More than 4 times per year    Active Member of Clubs or Organizations: Yes    Attends Banker Meetings: More than 4 times per year    Marital Status: Patient declined  Intimate Partner Violence: Not At Risk (04/21/2024)   Epic    Fear of Current or Ex-Partner: No    Emotionally Abused: No    Physically Abused: No    Sexually Abused: No  Depression (PHQ2-9): Low Risk (09/15/2024)   Depression (PHQ2-9)    PHQ-2 Score: 0  Alcohol Screen: Low Risk (04/15/2024)   Alcohol Screen    Last Alcohol Screening Score (AUDIT): 0  Housing: Unknown (04/21/2024)   Epic    Unable to Pay for Housing in the Last Year: No    Number of Times Moved in the Last Year: Not on file    Homeless in the Last Year: No  Utilities: Not At Risk (04/21/2024)   Epic    Threatened with loss of utilities: No  Health Literacy: Adequate Health Literacy (04/15/2024)   B1300 Health Literacy    Frequency of need for help with medical instructions: Never   Family History  Problem Relation Age of Onset   Cervical cancer Mother    Hypertension Mother    Diabetes Mother    Arthritis Mother    Cancer Mother    Heart disease Father    Hyperlipidemia Father    Colon  cancer Sister 19   Anemia Sister    Cancer Sister    Diabetes Sister    Hypertension Sister    Miscarriages / Stillbirths Sister    Leukemia Sister    Cancer Sister    Diabetes Sister    Hypertension Sister    Prostate cancer Brother 64   Cancer Brother    HIV/AIDS Brother    Multiple myeloma Son 54   Hypertension Son    Kidney disease Son    ADD / ADHD Son    Cancer Son    Multiple myeloma Maternal  Aunt    Alcohol abuse Brother    Colon polyps Neg Hx    Rectal cancer Neg Hx    Stomach cancer Neg Hx    Liver disease Neg Hx    Esophageal cancer Neg Hx    Past Surgical History:  Procedure Laterality Date   BREAST CYST EXCISION  1980   CESAREAN SECTION  09/1977   COLONOSCOPY     EXTRACORPOREAL SHOCK WAVE LITHOTRIPSY Right 12/28/2019   Procedure: RIGHT EXTRACORPOREAL SHOCK WAVE LITHOTRIPSY (ESWL);  Surgeon: Renda Glance, MD;  Location[facility];  Service: Urology;  Laterality: Right;   PERMANENT PACEMAKER INSERTION N/A 01/13/2013   SJM Accent DR RF implanted by Dr Kelsie for mobitz II AV block   PLANTAR FASCIECTOMY     POLYPECTOMY     PPM GENERATOR CHANGEOUT N/A 07/08/2023   Procedure: PPM GENERATOR CHANGEOUT;  Surgeon: Cindie Ole DASEN, MD;  Location: [facility] INVASIVE CV LAB;  Service: Cardiovascular;  Laterality: N/A;   WRIST SURGERY Right 2011   removed cyst      [1] No Known Allergies    Rolinda Rogue, MD 09/23/24 1640

## 2024-09-23 NOTE — ED Triage Notes (Signed)
 Pt has c/o a cough and congestion  that started Sunday and hasn't stopped since then. Has been taken mucinex with no effect. Last dose of mucinex 4pm  yesterday.

## 2024-09-23 NOTE — Discharge Instructions (Signed)
 Be aware, your cough medication may cause drowsiness. Please do not drive, operate heavy machinery or make important decisions while on this medication, it can cloud your judgement.

## 2024-10-08 ENCOUNTER — Encounter: Payer: Self-pay | Admitting: Hematology

## 2024-10-09 ENCOUNTER — Other Ambulatory Visit: Payer: Self-pay

## 2024-10-09 ENCOUNTER — Ambulatory Visit (INDEPENDENT_AMBULATORY_CARE_PROVIDER_SITE_OTHER): Payer: Medicare Other

## 2024-10-09 DIAGNOSIS — C9001 Multiple myeloma in remission: Secondary | ICD-10-CM

## 2024-10-09 DIAGNOSIS — I441 Atrioventricular block, second degree: Secondary | ICD-10-CM | POA: Diagnosis not present

## 2024-10-09 MED ORDER — LENALIDOMIDE 10 MG PO CAPS: 10.0000 mg | ORAL_CAPSULE | Freq: Every day | ORAL | 0 refills | Status: DC

## 2024-10-12 LAB — CUP PACEART REMOTE DEVICE CHECK
Battery Remaining Longevity: 96 mo
Battery Remaining Percentage: 90 %
Battery Voltage: 2.99 V
Brady Statistic AP VP Percent: 15 %
Brady Statistic AP VS Percent: 1 %
Brady Statistic AS VP Percent: 85 %
Brady Statistic AS VS Percent: 1 %
Brady Statistic RA Percent Paced: 15 %
Brady Statistic RV Percent Paced: 99 %
Date Time Interrogation Session: 20260104163117
Implantable Lead Connection Status: 753985
Implantable Lead Connection Status: 753985
Implantable Lead Implant Date: 20140408
Implantable Lead Implant Date: 20140408
Implantable Lead Location: 753859
Implantable Lead Location: 753860
Implantable Lead Model: 1944
Implantable Lead Model: 1948
Implantable Pulse Generator Implant Date: 20240930
Lead Channel Impedance Value: 480 Ohm
Lead Channel Impedance Value: 640 Ohm
Lead Channel Pacing Threshold Amplitude: 0.5 V
Lead Channel Pacing Threshold Amplitude: 0.75 V
Lead Channel Pacing Threshold Pulse Width: 0.5 ms
Lead Channel Pacing Threshold Pulse Width: 0.5 ms
Lead Channel Sensing Intrinsic Amplitude: 2.7 mV
Lead Channel Sensing Intrinsic Amplitude: 9.1 mV
Lead Channel Setting Pacing Amplitude: 2.5 V
Lead Channel Setting Pacing Amplitude: 2.5 V
Lead Channel Setting Pacing Pulse Width: 0.5 ms
Lead Channel Setting Sensing Sensitivity: 4 mV
Pulse Gen Model: 2272
Pulse Gen Serial Number: 8210888

## 2024-10-13 ENCOUNTER — Inpatient Hospital Stay

## 2024-10-13 ENCOUNTER — Inpatient Hospital Stay: Attending: Hematology

## 2024-10-13 ENCOUNTER — Other Ambulatory Visit: Payer: Self-pay | Admitting: Hematology and Oncology

## 2024-10-13 VITALS — BP 131/66 | HR 62 | Temp 98.4°F | Resp 18

## 2024-10-13 DIAGNOSIS — Z79620 Long term (current) use of immunosuppressive biologic: Secondary | ICD-10-CM | POA: Diagnosis not present

## 2024-10-13 DIAGNOSIS — C9000 Multiple myeloma not having achieved remission: Secondary | ICD-10-CM

## 2024-10-13 DIAGNOSIS — Z5112 Encounter for antineoplastic immunotherapy: Secondary | ICD-10-CM | POA: Insufficient documentation

## 2024-10-13 DIAGNOSIS — C9001 Multiple myeloma in remission: Secondary | ICD-10-CM | POA: Diagnosis not present

## 2024-10-13 LAB — CMP (CANCER CENTER ONLY)
ALT: 27 U/L (ref 0–44)
AST: 22 U/L (ref 15–41)
Albumin: 4.5 g/dL (ref 3.5–5.0)
Alkaline Phosphatase: 108 U/L (ref 38–126)
Anion gap: 9 (ref 5–15)
BUN: 7 mg/dL — ABNORMAL LOW (ref 8–23)
CO2: 27 mmol/L (ref 22–32)
Calcium: 9 mg/dL (ref 8.9–10.3)
Chloride: 105 mmol/L (ref 98–111)
Creatinine: 0.67 mg/dL (ref 0.44–1.00)
GFR, Estimated: >60 mL/min
Glucose, Bld: 87 mg/dL (ref 70–99)
Potassium: 3.6 mmol/L (ref 3.5–5.1)
Sodium: 141 mmol/L (ref 135–145)
Total Bilirubin: 0.6 mg/dL (ref 0.0–1.2)
Total Protein: 6.7 g/dL (ref 6.5–8.1)

## 2024-10-13 LAB — CBC WITH DIFFERENTIAL (CANCER CENTER ONLY)
Abs Immature Granulocytes: 0.01 K/uL (ref 0.00–0.07)
Basophils Absolute: 0 K/uL (ref 0.0–0.1)
Basophils Relative: 1 %
Eosinophils Absolute: 0.2 K/uL (ref 0.0–0.5)
Eosinophils Relative: 4 %
HCT: 37.3 % (ref 36.0–46.0)
Hemoglobin: 12.8 g/dL (ref 12.0–15.0)
Immature Granulocytes: 0 %
Lymphocytes Relative: 50 %
Lymphs Abs: 1.9 K/uL (ref 0.7–4.0)
MCH: 31 pg (ref 26.0–34.0)
MCHC: 34.3 g/dL (ref 30.0–36.0)
MCV: 90.3 fL (ref 80.0–100.0)
Monocytes Absolute: 0.6 K/uL (ref 0.1–1.0)
Monocytes Relative: 15 %
Neutro Abs: 1.1 K/uL — ABNORMAL LOW (ref 1.7–7.7)
Neutrophils Relative %: 30 %
Platelet Count: 194 K/uL (ref 150–400)
RBC: 4.13 MIL/uL (ref 3.87–5.11)
RDW: 15.8 % — ABNORMAL HIGH (ref 11.5–15.5)
WBC Count: 3.7 K/uL — ABNORMAL LOW (ref 4.0–10.5)
nRBC: 0 % (ref 0.0–0.2)

## 2024-10-13 MED ORDER — DARATUMUMAB-HYALURONIDASE-FIHJ 1800-30000 MG-UT/15ML ~~LOC~~ SOLN
1800.0000 mg | Freq: Once | SUBCUTANEOUS | Status: AC
  Administered 2024-10-13: 1800 mg via SUBCUTANEOUS
  Filled 2024-10-13: qty 15

## 2024-10-13 NOTE — Progress Notes (Signed)
 Patient took her premeds at home about 0800.

## 2024-10-13 NOTE — Patient Instructions (Signed)
 [facility] - A DEPT OF [facility]  Discharge Instructions: Thank you for choosing [facility] to provide your oncology and hematology care.   If you have a lab appointment with the Cancer Center, please go directly to the Cancer Center and check in at the registration area.   Wear comfortable clothing and clothing appropriate for easy access to any Portacath or PICC line.   We strive to give you quality time with your provider. You may need to reschedule your appointment if you arrive late (15 or more minutes).  Arriving late affects you and other patients whose appointments are after yours.  Also, if you miss three or more appointments without notifying the office, you may be dismissed from the clinic at the provider's discretion.      For prescription refill requests, have your pharmacy contact our office and allow 72 hours for refills to be completed.    Today you received the following chemotherapy and/or immunotherapy agents: daratumumab-hyaluronidase-fihj (DARZALEX FASPRO       To help prevent nausea and vomiting after your treatment, we encourage you to take your nausea medication as directed.  BELOW ARE SYMPTOMS THAT SHOULD BE REPORTED IMMEDIATELY: *FEVER GREATER THAN 100.4 F (38 C) OR HIGHER *CHILLS OR SWEATING *NAUSEA AND VOMITING THAT IS NOT CONTROLLED WITH YOUR NAUSEA MEDICATION *UNUSUAL SHORTNESS OF BREATH *UNUSUAL BRUISING OR BLEEDING *URINARY PROBLEMS (pain or burning when urinating, or frequent urination) *BOWEL PROBLEMS (unusual diarrhea, constipation, pain near the anus) TENDERNESS IN MOUTH AND THROAT WITH OR WITHOUT PRESENCE OF ULCERS (sore throat, sores in mouth, or a toothache) UNUSUAL RASH, SWELLING OR PAIN  UNUSUAL VAGINAL DISCHARGE OR ITCHING   Items with * indicate a potential emergency and should be followed up as soon as possible or go to the Emergency Department if any problems should occur.  Please show the  CHEMOTHERAPY ALERT CARD or IMMUNOTHERAPY ALERT CARD at check-in to the Emergency Department and triage nurse.  Should you have questions after your visit or need to cancel or reschedule your appointment, please contact [facility] - A DEPT OF [facility]  Dept: [phone]  and follow the prompts.  Office hours are 8:00 a.m. to 4:30 p.m. Monday - Friday. Please note that voicemails left after 4:00 p.m. may not be returned until the following business day.  We are closed weekends and major holidays. You have access to a nurse at all times for urgent questions. Please call the main number to the clinic Dept: [phone] and follow the prompts.   For any non-urgent questions, you may also contact your provider using MyChart. We now offer e-Visits for anyone 63 and older to request care online for non-urgent symptoms. For details visit [email or web address].   Also download the MyChart app! Go to the app store, search "MyChart", open the app, select [facility], and log in with your MyChart username and password.

## 2024-10-14 LAB — KAPPA/LAMBDA LIGHT CHAINS
Kappa free light chain: 7.6 mg/L (ref 3.3–19.4)
Kappa, lambda light chain ratio: 0.78 (ref 0.26–1.65)
Lambda free light chains: 9.7 mg/L (ref 5.7–26.3)

## 2024-10-14 NOTE — Progress Notes (Signed)
 Remote PPM Transmission

## 2024-10-15 LAB — PROTEIN ELECTROPHORESIS, SERUM
A/G Ratio: 1.5 (ref 0.7–1.7)
Albumin ELP: 3.9 g/dL (ref 2.9–4.4)
Alpha-1-Globulin: 0.2 g/dL (ref 0.0–0.4)
Alpha-2-Globulin: 0.8 g/dL (ref 0.4–1.0)
Beta Globulin: 0.9 g/dL (ref 0.7–1.3)
Gamma Globulin: 0.7 g/dL (ref 0.4–1.8)
Globulin, Total: 2.6 g/dL (ref 2.2–3.9)
M-Spike, %: 0.2 g/dL — ABNORMAL HIGH
Total Protein ELP: 6.5 g/dL (ref 6.0–8.5)

## 2024-10-17 ENCOUNTER — Ambulatory Visit: Payer: Self-pay | Admitting: Cardiovascular Disease

## 2024-10-20 LAB — IFE, DARA-SPECIFIC, SERUM
IgA: 20 mg/dL — ABNORMAL LOW (ref 87–352)
IgG (Immunoglobin G), Serum: 698 mg/dL (ref 586–1602)
IgM (Immunoglobulin M), Srm: 19 mg/dL — ABNORMAL LOW (ref 26–217)

## 2024-11-06 NOTE — Assessment & Plan Note (Signed)
 IgG lambda type, stage II, high risk -Diagnosed with CRAB criteria and bone marrow bx 05/01/2022, pathology confirmed plasma cell myeloma (60% plasma cells). Cytogenics and FISH c/w standard risk multiple myeloma except the deletion of 1P and gain of 1 q. which is a higher risk feature.  She is being treated as high risk -S/p 5 cycles induction DaraVRd starting 05/15/22 followed by autologous stem cell transplant 10/12/22 by Dr. Arman. Post transplant PET and marrow showed no evidence of disease -she received one cycle of DaraVRd to complete a total of 6 cycles -will continue dara and revlimid  maintenance, with daratumumab  injection 1800 mg every month for 2 years and Revlimid  10 mg on days 1-21 every 28 days, she started on March 18, 2023. -Will check her multiple myeloma labs and f/u every 2 months, M protein negative and light chain levels normal on 06/2024, she remains in remission.  -She will also follow-up with her transplant team

## 2024-11-09 ENCOUNTER — Ambulatory Visit: Payer: BC Managed Care – PPO

## 2024-11-09 ENCOUNTER — Other Ambulatory Visit: Payer: Self-pay | Admitting: Hematology

## 2024-11-09 DIAGNOSIS — C9001 Multiple myeloma in remission: Secondary | ICD-10-CM

## 2024-11-10 ENCOUNTER — Inpatient Hospital Stay

## 2024-11-10 ENCOUNTER — Inpatient Hospital Stay: Attending: Hematology

## 2024-11-10 ENCOUNTER — Other Ambulatory Visit: Payer: Self-pay

## 2024-11-10 ENCOUNTER — Inpatient Hospital Stay: Admitting: Hematology

## 2024-11-10 VITALS — BP 138/64 | HR 69 | Temp 98.0°F | Resp 16 | Ht 65.0 in | Wt 189.0 lb

## 2024-11-10 DIAGNOSIS — C9000 Multiple myeloma not having achieved remission: Secondary | ICD-10-CM

## 2024-11-10 DIAGNOSIS — C9001 Multiple myeloma in remission: Secondary | ICD-10-CM

## 2024-11-10 LAB — CMP (CANCER CENTER ONLY)
ALT: 25 U/L (ref 0–44)
AST: 23 U/L (ref 15–41)
Albumin: 4.3 g/dL (ref 3.5–5.0)
Alkaline Phosphatase: 108 U/L (ref 38–126)
Anion gap: 8 (ref 5–15)
BUN: 7 mg/dL — ABNORMAL LOW (ref 8–23)
CO2: 28 mmol/L (ref 22–32)
Calcium: 9.4 mg/dL (ref 8.9–10.3)
Chloride: 106 mmol/L (ref 98–111)
Creatinine: 0.68 mg/dL (ref 0.44–1.00)
GFR, Estimated: >60 mL/min
Glucose, Bld: 91 mg/dL (ref 70–99)
Potassium: 4 mmol/L (ref 3.5–5.1)
Sodium: 142 mmol/L (ref 135–145)
Total Bilirubin: 0.4 mg/dL (ref 0.0–1.2)
Total Protein: 6.6 g/dL (ref 6.5–8.1)

## 2024-11-10 LAB — CBC WITH DIFFERENTIAL (CANCER CENTER ONLY)
Abs Immature Granulocytes: 0 10*3/uL (ref 0.00–0.07)
Basophils Absolute: 0 10*3/uL (ref 0.0–0.1)
Basophils Relative: 1 %
Eosinophils Absolute: 0.1 10*3/uL (ref 0.0–0.5)
Eosinophils Relative: 4 %
HCT: 37.8 % (ref 36.0–46.0)
Hemoglobin: 12.8 g/dL (ref 12.0–15.0)
Immature Granulocytes: 0 %
Lymphocytes Relative: 47 %
Lymphs Abs: 1.5 10*3/uL (ref 0.7–4.0)
MCH: 30.5 pg (ref 26.0–34.0)
MCHC: 33.9 g/dL (ref 30.0–36.0)
MCV: 90 fL (ref 80.0–100.0)
Monocytes Absolute: 0.4 10*3/uL (ref 0.1–1.0)
Monocytes Relative: 13 %
Neutro Abs: 1.1 10*3/uL — ABNORMAL LOW (ref 1.7–7.7)
Neutrophils Relative %: 35 %
Platelet Count: 172 10*3/uL (ref 150–400)
RBC: 4.2 MIL/uL (ref 3.87–5.11)
RDW: 15.1 % (ref 11.5–15.5)
WBC Count: 3.2 10*3/uL — ABNORMAL LOW (ref 4.0–10.5)
nRBC: 0 % (ref 0.0–0.2)

## 2024-11-10 MED ORDER — ZOLEDRONIC ACID 4 MG/100ML IV SOLN
4.0000 mg | Freq: Once | INTRAVENOUS | Status: AC
  Administered 2024-11-10: 4 mg via INTRAVENOUS
  Filled 2024-11-10: qty 100

## 2024-11-10 MED ORDER — DARATUMUMAB-HYALURONIDASE-FIHJ 1800-30000 MG-UT/15ML ~~LOC~~ SOLN
1800.0000 mg | Freq: Once | SUBCUTANEOUS | Status: AC
  Administered 2024-11-10: 1800 mg via SUBCUTANEOUS
  Filled 2024-11-10: qty 15

## 2024-11-10 MED ORDER — LENALIDOMIDE 10 MG PO CAPS: 10.0000 mg | ORAL_CAPSULE | Freq: Every day | ORAL | 0 refills | Status: AC

## 2024-11-10 MED ORDER — SODIUM CHLORIDE 0.9 % IV SOLN: INTRAVENOUS | Status: DC

## 2024-11-13 ENCOUNTER — Encounter: Payer: Self-pay | Admitting: Hematology

## 2024-11-13 ENCOUNTER — Inpatient Hospital Stay (HOSPITAL_COMMUNITY): Admission: RE | Admit: 2024-11-13 | Discharge: 2024-11-13 | Payer: Self-pay

## 2024-11-13 ENCOUNTER — Other Ambulatory Visit: Payer: Self-pay

## 2024-11-13 ENCOUNTER — Encounter (HOSPITAL_COMMUNITY): Payer: Self-pay

## 2024-11-13 VITALS — BP 125/81 | HR 82 | Temp 99.8°F | Resp 20

## 2024-11-13 DIAGNOSIS — J40 Bronchitis, not specified as acute or chronic: Secondary | ICD-10-CM

## 2024-11-13 MED ORDER — AZITHROMYCIN 250 MG PO TABS: ORAL_TABLET | ORAL | 0 refills | Status: AC

## 2024-11-13 NOTE — Discharge Instructions (Signed)
 Follow-up with primary care. Symptoms due to acute bronchitis

## 2024-11-13 NOTE — ED Provider Notes (Signed)
 " [facility]    CSN: [id number] Arrival date & time: 11/13/24  1119      History   Chief Complaint Chief Complaint  Patient presents with   Wheezing    Nasal congestion as well - Entered by patient   Cough    HPI Alyssa Dudley is a 69 y.o. female.   Patient here for evaluation of cough x 1.5 weeks.  Husband has similar symptoms however he is spontaneously resolved.  She has history of multi myeloma, CHF, in chemotherapy.  She reports she has nasal congestion, rhinorrhea and, semiproductive cough.  Denies shortness of breath, swelling of her legs, orthopnea.    Past Medical History:  Diagnosis Date   Allergy    once a year    Arthritis    Cancer (HCC) 05/08/22   CHF (congestive heart failure) (HCC) 2014   Hyperlipidemia    borderline- no meds    Hypertension 03/2015   Pacemaker    Paroxysmal atrial fibrillation (HCC) 03/2014   detected on ppm interrogation   PONV (postoperative nausea and vomiting)    has vomitted in past   Salivary stone 04/05/2021   Second degree Mobitz II AV block    Vitamin D  deficiency     Patient Active Problem List   Diagnosis Date Noted   Dyspareunia, female 04/20/2024   Biventricular cardiac pacemaker in situ 04/20/2024   Chronic pruritic rash in adult 12/26/2023   Multiple myeloma (HCC) 05/08/2022   History of renal calculi 05/17/2021   Osteoarthritis of multiple joints 04/05/2021   Meralgia paraesthetica, left 02/13/2018   Plantar fasciitis of left foot 02/13/2018   HTN (hypertension) 08/17/2014   Borderline type 2 diabetes mellitus 08/17/2014   PAF (paroxysmal atrial fibrillation) (HCC) 06/07/2014   Hyperlipidemia    Vitamin D  deficiency    Mobitz type 2 second degree and high-grade heart block 01/07/2013   Family history of malignant neoplasm of gastrointestinal tract 10/15/2011   Colon polyps 10/15/2011    Past Surgical History:  Procedure Laterality Date   BREAST CYST EXCISION  1980   CESAREAN SECTION  09/1977    COLONOSCOPY     EXTRACORPOREAL SHOCK WAVE LITHOTRIPSY Right 12/28/2019   Procedure: RIGHT EXTRACORPOREAL SHOCK WAVE LITHOTRIPSY (ESWL);  Surgeon: Renda Glance, MD;  Location: [facility];  Service: Urology;  Laterality: Right;   PERMANENT PACEMAKER INSERTION N/A 01/13/2013   SJM [employer] DR RF implanted by Dr Kelsie for mobitz II AV block   PLANTAR FASCIECTOMY     POLYPECTOMY     PPM GENERATOR CHANGEOUT N/A 07/08/2023   Procedure: PPM GENERATOR CHANGEOUT;  Surgeon: Cindie Ole DASEN, MD;  Location: [facility] INVASIVE CV LAB;  Service: Cardiovascular;  Laterality: N/A;   WRIST SURGERY Right 2011   removed cyst    OB History   No obstetric history on file.      Home Medications    Prior to Admission medications  Medication Sig Start Date End Date Taking? Authorizing Provider  amLODipine  (NORVASC ) 5 MG tablet Take 1 tablet (5 mg total) by mouth daily. 04/17/24  Yes Lucius Krabbe, NP  aspirin  81 MG chewable tablet Chew 81 mg by mouth daily.   Yes [provider]  azithromycin  (ZITHROMAX  Z-PAK) 250 MG tablet Use as directed on packaging 11/13/24  Yes Juleen Rush, PA-C  Calcium  Carb-Cholecalciferol (CALCIUM  + VITAMIN D3 PO) Take 1 tablet by mouth daily.   Yes [provider]  lenalidomide  (REVLIMID ) 10 MG capsule Take 1 capsule (10 mg  total) by mouth daily. Take 1 capsule (10 mg total) by mouth daily for 21 days. And then 7 days off for a 28 day cycle. [employer] Auth # [id number] expires 11/10/2024 11/10/24  Yes Lanny Callander, MD  Multiple Vitamin (MULTIVITAMIN) tablet Take 1 tablet by mouth daily.   Yes [provider]  Vitamin D , Ergocalciferol , (DRISDOL ) 1.25 MG (50000 UNIT) CAPS capsule TAKE 1 CAPSULE BY MOUTH 2 TIMES A WEEK 08/10/24  Yes Hudnell, Corean, NP  HYDROcodone  bit-homatropine (HYCODAN) 5-1.5 MG/5ML syrup Take 5 mLs by mouth every 6 (six) hours as needed for cough. 09/23/24   Rolinda Rogue, MD  ibuprofen  (ADVIL ) 600 MG tablet Take 1 tablet  (600 mg total) by mouth every 8 (eight) hours as needed. 06/01/24   Boscia, Heather E, NP  triamcinolone  cream (KENALOG ) 0.1 % APPLY EXTERNALLY TO THE AFFECTED AREA THREE TIMES DAILY AS NEEDED FOR RASH 12/26/23   Lucius Corean, NP    Family History Family History  Problem Relation Age of Onset   Cervical cancer Mother    Hypertension Mother    Diabetes Mother    Arthritis Mother    Cancer Mother    Heart disease Father    Hyperlipidemia Father    Colon cancer Sister 76   Anemia Sister    Cancer Sister    Diabetes Sister    Hypertension Sister    Miscarriages / Stillbirths Sister    Leukemia Sister    Cancer Sister    Diabetes Sister    Hypertension Sister    Prostate cancer Brother 61   Cancer Brother    HIV/AIDS Brother    Multiple myeloma Son 15   Hypertension Son    Kidney disease Son    ADD / ADHD Son    Cancer Son    Multiple myeloma Maternal Aunt    Alcohol abuse Brother    Colon polyps Neg Hx    Rectal cancer Neg Hx    Stomach cancer Neg Hx    Liver disease Neg Hx    Esophageal cancer Neg Hx     Social History Social History[1]   Allergies   Patient has no known allergies.   Review of Systems Review of Systems  Constitutional:  Positive for chills. Negative for fatigue and fever.  HENT:  Positive for congestion and rhinorrhea. Negative for ear pain, nosebleeds, postnasal drip, sinus pressure, sinus pain and sore throat.   Eyes:  Negative for pain and redness.  Respiratory:  Positive for cough and wheezing. Negative for shortness of breath.   Gastrointestinal:  Negative for abdominal pain, diarrhea, nausea and vomiting.  Musculoskeletal:  Negative for arthralgias and myalgias.  Skin:  Negative for rash.  Allergic/Immunologic: Positive for immunocompromised state.  Neurological:  Negative for light-headedness and headaches.  Hematological:  Negative for adenopathy. Does not bruise/bleed easily.  Psychiatric/Behavioral:  Negative for confusion and  sleep disturbance.      Physical Exam Triage Vital Signs ED Triage Vitals  Encounter Vitals Group     BP 11/13/24 1133 125/81     Girls Systolic BP Percentile --      Girls Diastolic BP Percentile --      Boys Systolic BP Percentile --      Boys Diastolic BP Percentile --      Pulse Rate 11/13/24 1133 82     Resp 11/13/24 1133 20     Temp 11/13/24 1133 99.8 F (37.7 C)     Temp src --  SpO2 11/13/24 1133 92 %     Weight --      Height --      Head Circumference --      Peak Flow --      Pain Score 11/13/24 1129 9     Pain Loc --      Pain Education --      Exclude from Growth Chart --    No data found.  Updated Vital Signs BP 125/81   Pulse 82   Temp 99.8 F (37.7 C)   Resp 20   SpO2 92%   Visual Acuity Right Eye Distance:   Left Eye Distance:   Bilateral Distance:    Right Eye Near:   Left Eye Near:    Bilateral Near:     Physical Exam Vitals and nursing note reviewed.  Constitutional:      General: She is not in acute distress.    Appearance: Normal appearance. She is not ill-appearing.  HENT:     Head: Normocephalic and atraumatic.     Right Ear: Tympanic membrane and ear canal normal.     Left Ear: Tympanic membrane and ear canal normal.     Nose: No congestion or rhinorrhea.     Mouth/Throat:     Pharynx: No oropharyngeal exudate or posterior oropharyngeal erythema.  Eyes:     General: No scleral icterus.    Extraocular Movements: Extraocular movements intact.     Conjunctiva/sclera: Conjunctivae normal.  Cardiovascular:     Rate and Rhythm: Normal rate and regular rhythm.     Heart sounds: No murmur heard. Pulmonary:     Effort: Pulmonary effort is normal. No respiratory distress.     Breath sounds: Rhonchi (b/l throughout) present. No wheezing or rales.  Musculoskeletal:     Cervical back: Normal range of motion. No rigidity.     Right lower leg: No swelling. No edema.     Left lower leg: No swelling. No edema.  Lymphadenopathy:      Cervical: No cervical adenopathy.  Skin:    Capillary Refill: Capillary refill takes less than 2 seconds.     Coloration: Skin is not jaundiced.     Findings: No rash.  Neurological:     General: No focal deficit present.     Mental Status: She is alert and oriented to person, place, and time.     Motor: No weakness.     Gait: Gait normal.  Psychiatric:        Mood and Affect: Mood normal.        Behavior: Behavior normal.      UC Treatments / Results  Labs (all labs ordered are listed, but only abnormal results are displayed) Labs Reviewed - No data to display  EKG   Radiology No results found.  Procedures Procedures (including critical care time)  Medications Ordered in UC Medications - No data to display  Initial Impression / Assessment and Plan / UC Course  I have reviewed the triage vital signs and the nursing notes.  Pertinent labs & imaging results that were available during my care of the patient were reviewed by me and considered in my medical decision making (see chart for details).     She does not appear volume overloaded. Will start on azithromycin  to cover for bacterial infection due to immunocompromise due to chemotherapy, however, likely due to viral bronchitis. Return or go to emergency room if you experience shortness of breath, fever, chest pain, or any other  worsening symptoms. Discussed with patient patient agrees with plan of care. Final Clinical Impressions(s) / UC Diagnoses   Final diagnoses:  Bronchitis     Discharge Instructions      Follow-up with primary care. Symptoms due to acute bronchitis   ED Prescriptions     Medication Sig Dispense Auth. Provider   azithromycin  (ZITHROMAX  Z-PAK) 250 MG tablet Use as directed on packaging 6 tablet Juleen Rush, PA-C      PDMP not reviewed this encounter.    [1]  Social History Tobacco Use   Smoking status: Never   Smokeless tobacco: Never  Vaping Use   Vaping status: Never  Used  Substance Use Topics   Alcohol use: No   Drug use: No     Juleen Rush, PA-C 11/13/24 1236  "

## 2024-11-13 NOTE — ED Triage Notes (Signed)
 PT reports she has had a dry  cough ,wheezing for one week. Pt has tried OTC with out relief. Pt also reports her husband has had the same.

## 2024-12-08 ENCOUNTER — Inpatient Hospital Stay

## 2024-12-15 ENCOUNTER — Inpatient Hospital Stay: Attending: Hematology

## 2024-12-15 ENCOUNTER — Inpatient Hospital Stay

## 2025-02-02 ENCOUNTER — Inpatient Hospital Stay: Attending: Hematology | Admitting: Hematology

## 2025-04-22 ENCOUNTER — Ambulatory Visit
